# Patient Record
Sex: Female | Born: 1950
Health system: Southern US, Community
[De-identification: ages and names within clinical notes are randomized; demographics above are authoritative.]

## PROBLEM LIST (undated history)

## (undated) DIAGNOSIS — E039 Hypothyroidism, unspecified: Secondary | ICD-10-CM

## (undated) DIAGNOSIS — E079 Disorder of thyroid, unspecified: Secondary | ICD-10-CM

## (undated) DIAGNOSIS — Z860100 Personal history of colon polyps, unspecified: Secondary | ICD-10-CM

## (undated) DIAGNOSIS — M069 Rheumatoid arthritis, unspecified: Secondary | ICD-10-CM

## (undated) DIAGNOSIS — R0600 Dyspnea, unspecified: Secondary | ICD-10-CM

## (undated) DIAGNOSIS — K219 Gastro-esophageal reflux disease without esophagitis: Secondary | ICD-10-CM

## (undated) DIAGNOSIS — I10 Essential (primary) hypertension: Secondary | ICD-10-CM

## (undated) DIAGNOSIS — F419 Anxiety disorder, unspecified: Secondary | ICD-10-CM

## (undated) DIAGNOSIS — Z86711 Personal history of pulmonary embolism: Secondary | ICD-10-CM

## (undated) DIAGNOSIS — L930 Discoid lupus erythematosus: Secondary | ICD-10-CM

## (undated) DIAGNOSIS — F32A Depression, unspecified: Secondary | ICD-10-CM

## (undated) DIAGNOSIS — J449 Chronic obstructive pulmonary disease, unspecified: Secondary | ICD-10-CM

## (undated) DIAGNOSIS — E119 Type 2 diabetes mellitus without complications: Secondary | ICD-10-CM

## (undated) HISTORY — DX: Rheumatoid arthritis, unspecified: M06.9

## (undated) HISTORY — PX: ABDOMINAL HYSTERECTOMY: SHX81

## (undated) HISTORY — DX: Personal history of colonic polyps: Z86.010

## (undated) HISTORY — DX: Gastro-esophageal reflux disease without esophagitis: K21.9

## (undated) HISTORY — DX: Discoid lupus erythematosus: L93.0

## (undated) HISTORY — PX: OTHER SURGICAL HISTORY: SHX169

## (undated) HISTORY — DX: Disorder of thyroid, unspecified: E07.9

## (undated) HISTORY — DX: Chronic obstructive pulmonary disease, unspecified: J44.9

## (undated) HISTORY — DX: Personal history of colon polyps, unspecified: Z86.0100

## (undated) HISTORY — DX: Hypothyroidism, unspecified: E03.9

## (undated) HISTORY — PX: TONSILLECTOMY: SUR1361

## (undated) HISTORY — DX: Personal history of pulmonary embolism: Z86.711

## (undated) NOTE — *Deleted (*Deleted)
Nsg Discharge Note  Admit Date:  08/06/2020 Discharge date: 08/17/2020   Jaime Kelley to be D/C'd Skilled nursing facility per MD order.    Discharge Medication: Allergies as of 08/17/2020      Reactions   Effexor [venlafaxine Hydrochloride] Rash      Medication List    STOP taking these medications   HYDROcodone-acetaminophen 10-325 MG tablet Commonly known as: NORCO   warfarin 1 MG tablet Commonly known as: COUMADIN   warfarin 7.5 MG tablet Commonly known as: COUMADIN     TAKE these medications   acetaminophen 325 MG tablet Commonly known as: TYLENOL Take 650 mg by mouth every 4 (four) hours as needed for mild pain, fever or headache.   albuterol 108 (90 Base) MCG/ACT inhaler Commonly known as: ProAir HFA INHALE 2 PUFFS INTO THE LUNGS EVERY 4 (FOUR) HOURS AS NEEDED FOR WHEEZING. What changed:   how much to take  how to take this  when to take this  additional instructions   ALPRAZolam 1 MG tablet Commonly known as: XANAX Take 1 tablet (1 mg total) by mouth 3 (three) times daily as needed for anxiety. What changed: when to take this   amLODipine 10 MG tablet Commonly known as: NORVASC TAKE 1 TABLET BY MOUTH EVERY DAY   apixaban 5 MG Tabs tablet Commonly known as: ELIQUIS Take 1 tablet (5 mg total) by mouth 2 (two) times daily.   arformoterol 15 MCG/2ML Nebu Commonly known as: BROVANA Take 2 mLs (15 mcg total) by nebulization 2 (two) times daily.   ascorbic acid 500 MG tablet Commonly known as: VITAMIN C Take 500 mg by mouth 2 (two) times daily.   budesonide 0.25 MG/2ML nebulizer solution Commonly known as: PULMICORT Take 2 mLs (0.25 mg total) by nebulization 2 (two) times daily.   feeding supplement (GLUCERNA SHAKE) Liqd Take 237 mLs by mouth 3 (three) times daily between meals.   guaiFENesin 600 MG 12 hr tablet Commonly known as: MUCINEX Take 2 tablets (1,200 mg total) by mouth 2 (two) times daily.   insulin aspart 100 UNIT/ML  injection Commonly known as: novoLOG Inject 0-15 Units into the skin 3 (three) times daily with meals.   ipratropium-albuterol 0.5-2.5 (3) MG/3ML Soln Commonly known as: DUONEB TAKE 3 MLS BY NEBULIZATION EVERY 4 (FOUR) HOURS AS NEEDED. What changed: reasons to take this   losartan 100 MG tablet Commonly known as: COZAAR TAKE 1 TABLET BY MOUTH EVERY DAY   metFORMIN 500 MG tablet Commonly known as: GLUCOPHAGE Take 1 tablet (500 mg total) by mouth 2 (two) times daily with a meal.   multivitamin with minerals Tabs tablet Take 1 tablet by mouth daily.   Muscle Rub 10-15 % Crea Apply 1 application topically as needed for muscle pain.   OXYGEN Inhale 5 L into the lungs. continuous   pantoprazole 40 MG tablet Commonly known as: PROTONIX Take 1 tablet (40 mg total) by mouth daily. Start taking on: August 18, 2020   revefenacin 175 MCG/3ML nebulizer solution Commonly known as: YUPELRI Take 3 mLs (175 mcg total) by nebulization daily.   sertraline 100 MG tablet Commonly known as: ZOLOFT TAKE 1 TABLET BY MOUTH EVERY DAY   Spiriva Respimat 2.5 MCG/ACT Aers Generic drug: Tiotropium Bromide Monohydrate Inhale 2 puffs into the lungs daily.   Synthroid 137 MCG tablet Generic drug: levothyroxine TAKE 1 TABLET BY MOUTH EVERY DAY BEFORE BREAKFAST What changed: See the new instructions.   Vitamin D (Ergocalciferol) 1.25 MG (50000 UNIT) Caps  capsule Commonly known as: DRISDOL Take 50,000 Units by mouth daily.       Discharge Assessment: Vitals:   08/17/20 0734 08/17/20 1154  BP: 123/71 (!) 115/55  Pulse: 73 77  Resp: 20 20  Temp: 98.1 F (36.7 C) 98 F (36.7 C)  SpO2: 97% 94%   Skin clean, dry and intact without evidence of skin break down, no evidence of skin tears noted. IV catheter discontinued intact. Site without signs and symptoms of complications - no redness or edema noted at insertion site, patient denies c/o pain - only slight tenderness at site.  Dressing with  slight pressure applied.  D/c Instructions-Education: Discharge instructions given to patient/family with verbalized understanding. D/c education completed with patient/family including follow up instructions, medication list, d/c activities limitations if indicated, with other d/c instructions as indicated by MD - patient able to verbalize understanding, all questions fully answered. Patient instructed to return to ED, call 911, or call MD for any changes in condition.  Patient escorted via stretcher and [facility] ambulance services.  Lyndal Pulley, RN 08/17/2020 3:16 PM

---

## 1998-02-08 ENCOUNTER — Encounter: Admission: RE | Admit: 1998-02-08 | Discharge: 1998-02-08 | Payer: Self-pay | Admitting: Internal Medicine

## 1998-02-10 ENCOUNTER — Emergency Department (HOSPITAL_COMMUNITY): Admission: EM | Admit: 1998-02-10 | Discharge: 1998-02-10 | Payer: Self-pay | Admitting: Emergency Medicine

## 1998-02-16 ENCOUNTER — Ambulatory Visit: Admission: RE | Admit: 1998-02-16 | Discharge: 1998-02-16 | Payer: Self-pay | Admitting: *Deleted

## 1998-03-03 ENCOUNTER — Encounter: Admission: RE | Admit: 1998-03-03 | Discharge: 1998-03-03 | Payer: Self-pay | Admitting: Hematology and Oncology

## 1998-03-15 ENCOUNTER — Encounter: Admission: RE | Admit: 1998-03-15 | Discharge: 1998-03-15 | Payer: Self-pay | Admitting: Internal Medicine

## 1998-03-18 ENCOUNTER — Ambulatory Visit: Admission: RE | Admit: 1998-03-18 | Discharge: 1998-03-18 | Payer: Self-pay | Admitting: *Deleted

## 1998-04-01 ENCOUNTER — Encounter: Admission: RE | Admit: 1998-04-01 | Discharge: 1998-04-01 | Payer: Self-pay | Admitting: Hematology and Oncology

## 1998-04-13 ENCOUNTER — Ambulatory Visit (HOSPITAL_COMMUNITY): Admission: RE | Admit: 1998-04-13 | Discharge: 1998-04-13 | Payer: Self-pay | Admitting: *Deleted

## 1998-04-17 ENCOUNTER — Ambulatory Visit (HOSPITAL_COMMUNITY): Admission: RE | Admit: 1998-04-17 | Discharge: 1998-04-17 | Payer: Self-pay | Admitting: *Deleted

## 1998-05-21 ENCOUNTER — Other Ambulatory Visit: Admission: RE | Admit: 1998-05-21 | Discharge: 1998-05-21 | Payer: Self-pay | Admitting: *Deleted

## 1998-05-24 ENCOUNTER — Encounter: Admission: RE | Admit: 1998-05-24 | Discharge: 1998-05-24 | Payer: Self-pay | Admitting: Internal Medicine

## 1998-07-21 ENCOUNTER — Encounter: Admission: RE | Admit: 1998-07-21 | Discharge: 1998-07-21 | Payer: Self-pay | Admitting: Internal Medicine

## 1998-09-03 ENCOUNTER — Encounter: Admission: RE | Admit: 1998-09-03 | Discharge: 1998-09-03 | Payer: Self-pay | Admitting: Internal Medicine

## 1998-09-14 ENCOUNTER — Encounter: Admission: RE | Admit: 1998-09-14 | Discharge: 1998-09-14 | Payer: Self-pay | Admitting: Internal Medicine

## 1998-09-28 ENCOUNTER — Encounter: Admission: RE | Admit: 1998-09-28 | Discharge: 1998-09-28 | Payer: Self-pay | Admitting: Internal Medicine

## 1998-10-23 ENCOUNTER — Emergency Department (HOSPITAL_COMMUNITY): Admission: EM | Admit: 1998-10-23 | Discharge: 1998-10-23 | Payer: Self-pay | Admitting: Emergency Medicine

## 1998-10-23 ENCOUNTER — Encounter: Payer: Self-pay | Admitting: Emergency Medicine

## 1998-10-29 ENCOUNTER — Encounter: Admission: RE | Admit: 1998-10-29 | Discharge: 1998-10-29 | Payer: Self-pay | Admitting: Internal Medicine

## 1998-11-12 ENCOUNTER — Encounter: Admission: RE | Admit: 1998-11-12 | Discharge: 1998-11-12 | Payer: Self-pay | Admitting: Internal Medicine

## 1998-11-24 ENCOUNTER — Encounter: Admission: RE | Admit: 1998-11-24 | Discharge: 1998-11-24 | Payer: Self-pay | Admitting: Internal Medicine

## 1998-12-01 ENCOUNTER — Encounter: Admission: RE | Admit: 1998-12-01 | Discharge: 1998-12-01 | Payer: Self-pay | Admitting: Internal Medicine

## 1998-12-17 ENCOUNTER — Encounter: Admission: RE | Admit: 1998-12-17 | Discharge: 1998-12-17 | Payer: Self-pay | Admitting: Internal Medicine

## 1999-01-27 ENCOUNTER — Encounter: Admission: RE | Admit: 1999-01-27 | Discharge: 1999-01-27 | Payer: Self-pay | Admitting: Internal Medicine

## 1999-02-01 ENCOUNTER — Encounter: Admission: RE | Admit: 1999-02-01 | Discharge: 1999-02-01 | Payer: Self-pay | Admitting: Hematology and Oncology

## 1999-02-04 ENCOUNTER — Encounter: Admission: RE | Admit: 1999-02-04 | Discharge: 1999-02-04 | Payer: Self-pay | Admitting: Internal Medicine

## 1999-03-29 ENCOUNTER — Encounter: Admission: RE | Admit: 1999-03-29 | Discharge: 1999-03-29 | Payer: Self-pay | Admitting: Internal Medicine

## 1999-04-05 ENCOUNTER — Encounter: Admission: RE | Admit: 1999-04-05 | Discharge: 1999-04-05 | Payer: Self-pay | Admitting: Internal Medicine

## 1999-04-13 ENCOUNTER — Encounter: Admission: RE | Admit: 1999-04-13 | Discharge: 1999-04-13 | Payer: Self-pay | Admitting: Hematology and Oncology

## 1999-04-27 ENCOUNTER — Encounter: Admission: RE | Admit: 1999-04-27 | Discharge: 1999-04-27 | Payer: Self-pay | Admitting: Hematology and Oncology

## 1999-05-03 ENCOUNTER — Other Ambulatory Visit: Admission: RE | Admit: 1999-05-03 | Discharge: 1999-05-03 | Payer: Self-pay | Admitting: *Deleted

## 1999-05-11 ENCOUNTER — Encounter: Admission: RE | Admit: 1999-05-11 | Discharge: 1999-05-11 | Payer: Self-pay | Admitting: Hematology and Oncology

## 1999-05-18 ENCOUNTER — Encounter: Admission: RE | Admit: 1999-05-18 | Discharge: 1999-05-18 | Payer: Self-pay

## 1999-06-09 ENCOUNTER — Encounter: Admission: RE | Admit: 1999-06-09 | Discharge: 1999-06-09 | Payer: Self-pay | Admitting: Hematology and Oncology

## 1999-06-14 ENCOUNTER — Ambulatory Visit (HOSPITAL_COMMUNITY): Admission: RE | Admit: 1999-06-14 | Discharge: 1999-06-14 | Payer: Self-pay

## 1999-07-29 ENCOUNTER — Emergency Department (HOSPITAL_COMMUNITY): Admission: EM | Admit: 1999-07-29 | Discharge: 1999-07-29 | Payer: Self-pay | Admitting: Emergency Medicine

## 1999-07-29 ENCOUNTER — Encounter: Payer: Self-pay | Admitting: Emergency Medicine

## 1999-08-03 ENCOUNTER — Emergency Department (HOSPITAL_COMMUNITY): Admission: EM | Admit: 1999-08-03 | Discharge: 1999-08-03 | Payer: Self-pay | Admitting: Emergency Medicine

## 1999-08-03 ENCOUNTER — Encounter: Payer: Self-pay | Admitting: Emergency Medicine

## 1999-08-25 ENCOUNTER — Encounter: Admission: RE | Admit: 1999-08-25 | Discharge: 1999-08-25 | Payer: Self-pay | Admitting: Hematology and Oncology

## 1999-09-29 ENCOUNTER — Encounter: Admission: RE | Admit: 1999-09-29 | Discharge: 1999-09-29 | Payer: Self-pay | Admitting: Internal Medicine

## 1999-11-25 ENCOUNTER — Encounter: Admission: RE | Admit: 1999-11-25 | Discharge: 1999-11-25 | Payer: Self-pay | Admitting: Internal Medicine

## 2000-01-30 ENCOUNTER — Encounter: Admission: RE | Admit: 2000-01-30 | Discharge: 2000-01-30 | Payer: Self-pay | Admitting: Internal Medicine

## 2000-04-05 ENCOUNTER — Encounter: Admission: RE | Admit: 2000-04-05 | Discharge: 2000-04-05 | Payer: Self-pay | Admitting: Hematology and Oncology

## 2000-04-10 ENCOUNTER — Encounter: Admission: RE | Admit: 2000-04-10 | Discharge: 2000-04-10 | Payer: Self-pay | Admitting: Internal Medicine

## 2000-04-24 ENCOUNTER — Encounter: Admission: RE | Admit: 2000-04-24 | Discharge: 2000-04-24 | Payer: Self-pay | Admitting: Internal Medicine

## 2000-06-08 ENCOUNTER — Encounter: Admission: RE | Admit: 2000-06-08 | Discharge: 2000-06-08 | Payer: Self-pay | Admitting: Internal Medicine

## 2000-07-06 ENCOUNTER — Emergency Department (HOSPITAL_COMMUNITY): Admission: EM | Admit: 2000-07-06 | Discharge: 2000-07-06 | Payer: Self-pay | Admitting: *Deleted

## 2001-03-22 ENCOUNTER — Encounter: Admission: RE | Admit: 2001-03-22 | Discharge: 2001-03-22 | Payer: Self-pay | Admitting: Obstetrics and Gynecology

## 2001-03-22 ENCOUNTER — Encounter: Payer: Self-pay | Admitting: Family Medicine

## 2001-08-13 ENCOUNTER — Other Ambulatory Visit: Admission: RE | Admit: 2001-08-13 | Discharge: 2001-08-13 | Payer: Self-pay | Admitting: *Deleted

## 2001-09-05 ENCOUNTER — Encounter: Admission: RE | Admit: 2001-09-05 | Discharge: 2001-09-05 | Payer: Self-pay | Admitting: Family Medicine

## 2001-09-05 ENCOUNTER — Encounter: Payer: Self-pay | Admitting: Family Medicine

## 2002-09-09 ENCOUNTER — Other Ambulatory Visit: Admission: RE | Admit: 2002-09-09 | Discharge: 2002-09-09 | Payer: Self-pay | Admitting: Obstetrics and Gynecology

## 2002-12-25 ENCOUNTER — Encounter: Payer: Self-pay | Admitting: Family Medicine

## 2002-12-25 ENCOUNTER — Encounter: Admission: RE | Admit: 2002-12-25 | Discharge: 2002-12-25 | Payer: Self-pay | Admitting: Family Medicine

## 2003-08-03 ENCOUNTER — Encounter: Admission: RE | Admit: 2003-08-03 | Discharge: 2003-08-03 | Payer: Self-pay | Admitting: Family Medicine

## 2003-08-03 ENCOUNTER — Encounter: Payer: Self-pay | Admitting: Family Medicine

## 2004-01-22 ENCOUNTER — Encounter: Admission: RE | Admit: 2004-01-22 | Discharge: 2004-01-22 | Payer: Self-pay | Admitting: *Deleted

## 2004-08-31 ENCOUNTER — Ambulatory Visit: Payer: Self-pay | Admitting: Internal Medicine

## 2004-09-07 ENCOUNTER — Ambulatory Visit: Payer: Self-pay | Admitting: Family Medicine

## 2004-09-19 ENCOUNTER — Ambulatory Visit: Payer: Self-pay | Admitting: Family Medicine

## 2004-10-31 ENCOUNTER — Ambulatory Visit: Payer: Self-pay | Admitting: Family Medicine

## 2004-11-23 ENCOUNTER — Ambulatory Visit: Payer: Self-pay | Admitting: Family Medicine

## 2004-11-28 ENCOUNTER — Ambulatory Visit: Payer: Self-pay | Admitting: Family Medicine

## 2004-12-26 ENCOUNTER — Ambulatory Visit: Payer: Self-pay | Admitting: Internal Medicine

## 2005-01-27 ENCOUNTER — Ambulatory Visit: Payer: Self-pay | Admitting: Family Medicine

## 2005-02-03 ENCOUNTER — Encounter: Admission: RE | Admit: 2005-02-03 | Discharge: 2005-02-03 | Payer: Self-pay | Admitting: *Deleted

## 2005-02-16 ENCOUNTER — Ambulatory Visit: Payer: Self-pay | Admitting: Family Medicine

## 2005-02-27 ENCOUNTER — Ambulatory Visit: Payer: Self-pay | Admitting: Family Medicine

## 2005-03-30 ENCOUNTER — Ambulatory Visit: Payer: Self-pay | Admitting: Family Medicine

## 2005-04-12 ENCOUNTER — Ambulatory Visit: Payer: Self-pay | Admitting: Family Medicine

## 2005-05-08 ENCOUNTER — Ambulatory Visit: Payer: Self-pay | Admitting: Family Medicine

## 2005-06-09 ENCOUNTER — Ambulatory Visit: Payer: Self-pay | Admitting: Family Medicine

## 2005-07-07 ENCOUNTER — Ambulatory Visit: Payer: Self-pay | Admitting: Family Medicine

## 2005-07-08 ENCOUNTER — Emergency Department (HOSPITAL_COMMUNITY): Admission: EM | Admit: 2005-07-08 | Discharge: 2005-07-08 | Payer: Self-pay | Admitting: Emergency Medicine

## 2005-08-04 ENCOUNTER — Ambulatory Visit: Payer: Self-pay | Admitting: Family Medicine

## 2005-08-23 ENCOUNTER — Ambulatory Visit: Payer: Self-pay | Admitting: Family Medicine

## 2005-08-24 ENCOUNTER — Emergency Department (HOSPITAL_COMMUNITY): Admission: EM | Admit: 2005-08-24 | Discharge: 2005-08-24 | Payer: Self-pay | Admitting: Emergency Medicine

## 2005-09-01 ENCOUNTER — Ambulatory Visit: Payer: Self-pay | Admitting: Family Medicine

## 2005-09-11 ENCOUNTER — Ambulatory Visit: Payer: Self-pay | Admitting: Family Medicine

## 2005-10-13 ENCOUNTER — Ambulatory Visit: Payer: Self-pay | Admitting: Family Medicine

## 2005-11-14 ENCOUNTER — Ambulatory Visit: Payer: Self-pay | Admitting: Family Medicine

## 2005-12-18 ENCOUNTER — Ambulatory Visit: Payer: Self-pay | Admitting: Family Medicine

## 2006-01-15 ENCOUNTER — Ambulatory Visit: Payer: Self-pay | Admitting: Family Medicine

## 2006-02-12 ENCOUNTER — Encounter: Admission: RE | Admit: 2006-02-12 | Discharge: 2006-02-12 | Payer: Self-pay | Admitting: Family Medicine

## 2006-02-12 ENCOUNTER — Ambulatory Visit: Payer: Self-pay | Admitting: Family Medicine

## 2006-02-20 ENCOUNTER — Ambulatory Visit: Payer: Self-pay | Admitting: Family Medicine

## 2006-02-27 ENCOUNTER — Ambulatory Visit: Payer: Self-pay | Admitting: Family Medicine

## 2006-03-12 ENCOUNTER — Ambulatory Visit: Payer: Self-pay | Admitting: Family Medicine

## 2006-03-16 ENCOUNTER — Ambulatory Visit: Payer: Self-pay | Admitting: Family Medicine

## 2006-03-24 ENCOUNTER — Emergency Department (HOSPITAL_COMMUNITY): Admission: EM | Admit: 2006-03-24 | Discharge: 2006-03-24 | Payer: Self-pay | Admitting: Emergency Medicine

## 2006-04-03 ENCOUNTER — Ambulatory Visit: Payer: Self-pay | Admitting: Family Medicine

## 2006-04-03 ENCOUNTER — Encounter: Admission: RE | Admit: 2006-04-03 | Discharge: 2006-04-03 | Payer: Self-pay | Admitting: Obstetrics and Gynecology

## 2006-05-04 ENCOUNTER — Ambulatory Visit: Payer: Self-pay | Admitting: Family Medicine

## 2006-06-01 ENCOUNTER — Ambulatory Visit: Payer: Self-pay | Admitting: Family Medicine

## 2006-07-04 ENCOUNTER — Ambulatory Visit: Payer: Self-pay | Admitting: Family Medicine

## 2006-08-06 ENCOUNTER — Ambulatory Visit: Payer: Self-pay | Admitting: Family Medicine

## 2006-09-07 ENCOUNTER — Ambulatory Visit: Payer: Self-pay | Admitting: Family Medicine

## 2006-10-08 ENCOUNTER — Ambulatory Visit: Payer: Self-pay | Admitting: Family Medicine

## 2006-11-09 ENCOUNTER — Ambulatory Visit: Payer: Self-pay | Admitting: Family Medicine

## 2006-11-14 ENCOUNTER — Ambulatory Visit: Payer: Self-pay | Admitting: Family Medicine

## 2006-11-21 ENCOUNTER — Ambulatory Visit: Payer: Self-pay | Admitting: Family Medicine

## 2006-11-22 LAB — CONVERTED CEMR LAB
Amylase: 37 units/L (ref 27–131)
Basophils Absolute: 0 10*3/uL (ref 0.0–0.1)
Basophils Relative: 0.5 % (ref 0.0–1.0)
CO2: 29 meq/L (ref 19–32)
Chloride: 100 meq/L (ref 96–112)
Creatinine, Ser: 0.9 mg/dL (ref 0.4–1.2)
HCT: 43.5 % (ref 36.0–46.0)
Hemoglobin: 15.3 g/dL — ABNORMAL HIGH (ref 12.0–15.0)
MCHC: 35.2 g/dL (ref 30.0–36.0)
Neutrophils Relative %: 44.5 % (ref 43.0–77.0)
RBC: 4.64 M/uL (ref 3.87–5.11)
RDW: 14.5 % (ref 11.5–14.6)
Sodium: 138 meq/L (ref 135–145)
WBC: 8.6 10*3/uL (ref 4.5–10.5)

## 2006-11-25 ENCOUNTER — Emergency Department (HOSPITAL_COMMUNITY): Admission: EM | Admit: 2006-11-25 | Discharge: 2006-11-25 | Payer: Self-pay | Admitting: *Deleted

## 2006-11-28 ENCOUNTER — Ambulatory Visit: Payer: Self-pay | Admitting: Family Medicine

## 2006-12-05 ENCOUNTER — Ambulatory Visit: Payer: Self-pay | Admitting: Family Medicine

## 2006-12-12 ENCOUNTER — Ambulatory Visit: Payer: Self-pay | Admitting: Family Medicine

## 2006-12-31 ENCOUNTER — Encounter: Payer: Self-pay | Admitting: Family Medicine

## 2007-01-11 ENCOUNTER — Encounter: Admission: RE | Admit: 2007-01-11 | Discharge: 2007-01-11 | Payer: Self-pay | Admitting: Internal Medicine

## 2007-01-14 ENCOUNTER — Ambulatory Visit: Payer: Self-pay | Admitting: Family Medicine

## 2007-02-15 ENCOUNTER — Ambulatory Visit: Payer: Self-pay | Admitting: Family Medicine

## 2007-02-19 ENCOUNTER — Encounter: Admission: RE | Admit: 2007-02-19 | Discharge: 2007-02-19 | Payer: Self-pay | Admitting: Family Medicine

## 2007-03-01 ENCOUNTER — Ambulatory Visit: Payer: Self-pay | Admitting: Family Medicine

## 2007-03-29 ENCOUNTER — Ambulatory Visit: Payer: Self-pay | Admitting: Family Medicine

## 2007-04-15 DIAGNOSIS — K219 Gastro-esophageal reflux disease without esophagitis: Secondary | ICD-10-CM

## 2007-04-15 DIAGNOSIS — J438 Other emphysema: Secondary | ICD-10-CM | POA: Insufficient documentation

## 2007-04-15 DIAGNOSIS — Z860100 Personal history of colon polyps, unspecified: Secondary | ICD-10-CM | POA: Insufficient documentation

## 2007-04-15 DIAGNOSIS — Z86718 Personal history of other venous thrombosis and embolism: Secondary | ICD-10-CM | POA: Insufficient documentation

## 2007-04-15 DIAGNOSIS — Z8719 Personal history of other diseases of the digestive system: Secondary | ICD-10-CM

## 2007-04-15 DIAGNOSIS — L930 Discoid lupus erythematosus: Secondary | ICD-10-CM

## 2007-04-15 DIAGNOSIS — E039 Hypothyroidism, unspecified: Secondary | ICD-10-CM | POA: Insufficient documentation

## 2007-04-29 ENCOUNTER — Ambulatory Visit: Payer: Self-pay | Admitting: Family Medicine

## 2007-05-31 ENCOUNTER — Ambulatory Visit: Payer: Self-pay | Admitting: Family Medicine

## 2007-05-31 DIAGNOSIS — I824Y9 Acute embolism and thrombosis of unspecified deep veins of unspecified proximal lower extremity: Secondary | ICD-10-CM

## 2007-05-31 LAB — CONVERTED CEMR LAB: Prothrombin Time: 18.8 s

## 2007-06-17 ENCOUNTER — Ambulatory Visit: Payer: Self-pay | Admitting: Family Medicine

## 2007-06-17 DIAGNOSIS — J209 Acute bronchitis, unspecified: Secondary | ICD-10-CM

## 2007-07-01 ENCOUNTER — Ambulatory Visit: Payer: Self-pay | Admitting: Family Medicine

## 2007-07-29 ENCOUNTER — Ambulatory Visit: Payer: Self-pay | Admitting: Family Medicine

## 2007-07-29 LAB — CONVERTED CEMR LAB
INR: 4.3
Prothrombin Time: 25 s

## 2007-08-12 ENCOUNTER — Ambulatory Visit: Payer: Self-pay | Admitting: Family Medicine

## 2007-09-13 ENCOUNTER — Ambulatory Visit: Payer: Self-pay | Admitting: Family Medicine

## 2007-10-11 ENCOUNTER — Ambulatory Visit: Payer: Self-pay | Admitting: Family Medicine

## 2007-10-11 LAB — CONVERTED CEMR LAB: Prothrombin Time: 19.9 s

## 2007-10-31 ENCOUNTER — Ambulatory Visit: Payer: Self-pay | Admitting: Family Medicine

## 2007-10-31 DIAGNOSIS — J449 Chronic obstructive pulmonary disease, unspecified: Secondary | ICD-10-CM | POA: Insufficient documentation

## 2007-11-04 ENCOUNTER — Ambulatory Visit: Payer: Self-pay | Admitting: Family Medicine

## 2007-11-11 ENCOUNTER — Ambulatory Visit: Payer: Self-pay | Admitting: Family Medicine

## 2007-11-11 LAB — CONVERTED CEMR LAB: INR: 2.6

## 2007-12-10 ENCOUNTER — Encounter: Payer: Self-pay | Admitting: Family Medicine

## 2007-12-13 ENCOUNTER — Ambulatory Visit: Payer: Self-pay | Admitting: Family Medicine

## 2007-12-13 LAB — CONVERTED CEMR LAB
INR: 3.3
Prothrombin Time: 21.9 s

## 2007-12-20 ENCOUNTER — Telehealth: Payer: Self-pay | Admitting: Family Medicine

## 2008-01-10 ENCOUNTER — Ambulatory Visit: Payer: Self-pay | Admitting: Family Medicine

## 2008-01-10 LAB — CONVERTED CEMR LAB: INR: 4

## 2008-01-23 ENCOUNTER — Telehealth: Payer: Self-pay | Admitting: Family Medicine

## 2008-01-24 ENCOUNTER — Ambulatory Visit: Payer: Self-pay | Admitting: Family Medicine

## 2008-02-04 ENCOUNTER — Encounter: Payer: Self-pay | Admitting: Family Medicine

## 2008-02-07 ENCOUNTER — Ambulatory Visit: Payer: Self-pay | Admitting: Family Medicine

## 2008-02-21 ENCOUNTER — Encounter: Admission: RE | Admit: 2008-02-21 | Discharge: 2008-02-21 | Payer: Self-pay | Admitting: Family Medicine

## 2008-02-21 ENCOUNTER — Ambulatory Visit: Payer: Self-pay | Admitting: Family Medicine

## 2008-02-21 LAB — CONVERTED CEMR LAB
INR: 2.5
Prothrombin Time: 19.2 s

## 2008-03-20 ENCOUNTER — Ambulatory Visit: Payer: Self-pay | Admitting: Family Medicine

## 2008-03-20 LAB — CONVERTED CEMR LAB
INR: 2.8
Prothrombin Time: 20.2 s

## 2008-04-20 ENCOUNTER — Ambulatory Visit: Payer: Self-pay | Admitting: Family Medicine

## 2008-05-22 ENCOUNTER — Ambulatory Visit: Payer: Self-pay | Admitting: Family Medicine

## 2008-05-25 ENCOUNTER — Ambulatory Visit: Payer: Self-pay | Admitting: Family Medicine

## 2008-06-04 ENCOUNTER — Encounter: Payer: Self-pay | Admitting: Family Medicine

## 2008-06-19 ENCOUNTER — Ambulatory Visit: Payer: Self-pay | Admitting: Family Medicine

## 2008-06-26 ENCOUNTER — Ambulatory Visit: Payer: Self-pay | Admitting: Family Medicine

## 2008-06-26 DIAGNOSIS — S6390XA Sprain of unspecified part of unspecified wrist and hand, initial encounter: Secondary | ICD-10-CM | POA: Insufficient documentation

## 2008-06-30 ENCOUNTER — Telehealth: Payer: Self-pay | Admitting: Family Medicine

## 2008-07-20 ENCOUNTER — Ambulatory Visit: Payer: Self-pay | Admitting: Family Medicine

## 2008-07-20 LAB — CONVERTED CEMR LAB
INR: 2.8
Prothrombin Time: 20.3 s

## 2008-07-23 ENCOUNTER — Ambulatory Visit: Payer: Self-pay | Admitting: Internal Medicine

## 2008-08-14 ENCOUNTER — Telehealth: Payer: Self-pay | Admitting: Family Medicine

## 2008-10-05 ENCOUNTER — Encounter: Payer: Self-pay | Admitting: Family Medicine

## 2008-11-06 ENCOUNTER — Ambulatory Visit: Payer: Self-pay | Admitting: Family Medicine

## 2008-11-06 LAB — CONVERTED CEMR LAB
INR: 2.4
Prothrombin Time: 18.8 s

## 2008-12-11 ENCOUNTER — Ambulatory Visit: Payer: Self-pay | Admitting: Family Medicine

## 2008-12-11 LAB — CONVERTED CEMR LAB: Prothrombin Time: 19 s

## 2009-01-08 ENCOUNTER — Ambulatory Visit: Payer: Self-pay | Admitting: Family Medicine

## 2009-02-01 ENCOUNTER — Encounter: Payer: Self-pay | Admitting: Family Medicine

## 2009-02-08 ENCOUNTER — Ambulatory Visit: Payer: Self-pay | Admitting: Family Medicine

## 2009-02-08 LAB — CONVERTED CEMR LAB: Prothrombin Time: 23 s

## 2009-02-10 ENCOUNTER — Telehealth: Payer: Self-pay | Admitting: Family Medicine

## 2009-02-10 ENCOUNTER — Ambulatory Visit: Payer: Self-pay | Admitting: Family Medicine

## 2009-02-10 DIAGNOSIS — M069 Rheumatoid arthritis, unspecified: Secondary | ICD-10-CM | POA: Insufficient documentation

## 2009-02-26 ENCOUNTER — Encounter: Payer: Self-pay | Admitting: Family Medicine

## 2009-02-26 ENCOUNTER — Encounter: Admission: RE | Admit: 2009-02-26 | Discharge: 2009-02-26 | Payer: Self-pay | Admitting: Family Medicine

## 2009-03-01 ENCOUNTER — Encounter: Admission: RE | Admit: 2009-03-01 | Discharge: 2009-03-01 | Payer: Self-pay | Admitting: Family Medicine

## 2009-03-08 ENCOUNTER — Telehealth: Payer: Self-pay | Admitting: Family Medicine

## 2009-03-10 ENCOUNTER — Ambulatory Visit: Payer: Self-pay | Admitting: Family Medicine

## 2009-03-10 LAB — CONVERTED CEMR LAB
INR: 2.5
Prothrombin Time: 19.4 s

## 2009-03-26 ENCOUNTER — Ambulatory Visit: Payer: Self-pay | Admitting: Family Medicine

## 2009-04-09 ENCOUNTER — Telehealth: Payer: Self-pay | Admitting: Family Medicine

## 2009-04-12 ENCOUNTER — Ambulatory Visit: Payer: Self-pay | Admitting: Family Medicine

## 2009-04-12 ENCOUNTER — Telehealth: Payer: Self-pay | Admitting: Family Medicine

## 2009-05-05 ENCOUNTER — Encounter: Payer: Self-pay | Admitting: Family Medicine

## 2009-05-12 ENCOUNTER — Ambulatory Visit: Payer: Self-pay | Admitting: Family Medicine

## 2009-05-12 LAB — CONVERTED CEMR LAB
INR: 2.7
Prothrombin Time: 19.9 s

## 2009-06-16 ENCOUNTER — Ambulatory Visit: Payer: Self-pay | Admitting: Family Medicine

## 2009-06-16 LAB — CONVERTED CEMR LAB
INR: 3
Prothrombin Time: 20.9 s

## 2009-07-09 ENCOUNTER — Ambulatory Visit: Payer: Self-pay | Admitting: Family Medicine

## 2009-07-09 DIAGNOSIS — S335XXA Sprain of ligaments of lumbar spine, initial encounter: Secondary | ICD-10-CM

## 2009-07-12 ENCOUNTER — Telehealth: Payer: Self-pay | Admitting: Family Medicine

## 2009-07-16 ENCOUNTER — Telehealth: Payer: Self-pay | Admitting: Family Medicine

## 2009-07-16 ENCOUNTER — Ambulatory Visit: Payer: Self-pay | Admitting: Family Medicine

## 2009-07-19 ENCOUNTER — Telehealth: Payer: Self-pay | Admitting: Family Medicine

## 2009-07-26 ENCOUNTER — Ambulatory Visit: Payer: Self-pay | Admitting: Family Medicine

## 2009-07-28 ENCOUNTER — Encounter: Admission: RE | Admit: 2009-07-28 | Discharge: 2009-09-22 | Payer: Self-pay | Admitting: Family Medicine

## 2009-08-02 ENCOUNTER — Telehealth: Payer: Self-pay | Admitting: Family Medicine

## 2009-08-03 ENCOUNTER — Ambulatory Visit: Payer: Self-pay | Admitting: Family Medicine

## 2009-08-04 ENCOUNTER — Encounter: Payer: Self-pay | Admitting: Family Medicine

## 2009-08-04 ENCOUNTER — Telehealth: Payer: Self-pay | Admitting: Family Medicine

## 2009-08-05 ENCOUNTER — Telehealth: Payer: Self-pay | Admitting: Family Medicine

## 2009-08-06 ENCOUNTER — Ambulatory Visit: Payer: Self-pay | Admitting: Family Medicine

## 2009-08-06 LAB — CONVERTED CEMR LAB: INR: 2.5

## 2009-08-26 ENCOUNTER — Encounter: Payer: Self-pay | Admitting: Family Medicine

## 2009-08-27 ENCOUNTER — Telehealth: Payer: Self-pay | Admitting: Family Medicine

## 2009-08-31 ENCOUNTER — Telehealth: Payer: Self-pay | Admitting: Family Medicine

## 2009-08-31 ENCOUNTER — Encounter (INDEPENDENT_AMBULATORY_CARE_PROVIDER_SITE_OTHER): Payer: Self-pay | Admitting: *Deleted

## 2009-09-01 ENCOUNTER — Encounter: Admission: RE | Admit: 2009-09-01 | Discharge: 2009-09-01 | Payer: Self-pay | Admitting: Family Medicine

## 2009-09-06 ENCOUNTER — Ambulatory Visit: Payer: Self-pay | Admitting: Family Medicine

## 2009-09-29 ENCOUNTER — Telehealth: Payer: Self-pay | Admitting: Family Medicine

## 2009-10-04 ENCOUNTER — Ambulatory Visit: Payer: Self-pay | Admitting: Family Medicine

## 2009-10-04 LAB — CONVERTED CEMR LAB
INR: 2
Prothrombin Time: 17.6 s

## 2009-10-23 HISTORY — PX: BREAST EXCISIONAL BIOPSY: SUR124

## 2009-10-26 ENCOUNTER — Telehealth: Payer: Self-pay | Admitting: Family Medicine

## 2009-11-05 ENCOUNTER — Telehealth: Payer: Self-pay | Admitting: Family Medicine

## 2009-11-05 ENCOUNTER — Ambulatory Visit: Payer: Self-pay | Admitting: Family Medicine

## 2009-11-08 ENCOUNTER — Telehealth: Payer: Self-pay | Admitting: Family Medicine

## 2009-11-18 ENCOUNTER — Ambulatory Visit: Payer: Self-pay | Admitting: Family Medicine

## 2009-12-03 ENCOUNTER — Ambulatory Visit: Payer: Self-pay | Admitting: Family Medicine

## 2009-12-03 LAB — CONVERTED CEMR LAB: Prothrombin Time: 24.4 s

## 2009-12-10 ENCOUNTER — Telehealth: Payer: Self-pay | Admitting: Family Medicine

## 2009-12-17 ENCOUNTER — Ambulatory Visit: Payer: Self-pay | Admitting: Family Medicine

## 2010-01-05 ENCOUNTER — Encounter: Payer: Self-pay | Admitting: Family Medicine

## 2010-01-05 ENCOUNTER — Telehealth: Payer: Self-pay | Admitting: Family Medicine

## 2010-01-17 ENCOUNTER — Ambulatory Visit: Payer: Self-pay | Admitting: Family Medicine

## 2010-01-17 LAB — CONVERTED CEMR LAB: Prothrombin Time: 17.3 s

## 2010-02-02 ENCOUNTER — Telehealth: Payer: Self-pay | Admitting: Family Medicine

## 2010-02-14 ENCOUNTER — Ambulatory Visit: Payer: Self-pay | Admitting: Family Medicine

## 2010-02-14 LAB — CONVERTED CEMR LAB: INR: 1.6

## 2010-03-02 ENCOUNTER — Encounter: Admission: RE | Admit: 2010-03-02 | Discharge: 2010-03-02 | Payer: Self-pay | Admitting: Family Medicine

## 2010-03-02 ENCOUNTER — Ambulatory Visit: Payer: Self-pay | Admitting: Family Medicine

## 2010-03-02 LAB — CONVERTED CEMR LAB: Prothrombin Time: 20.2 s

## 2010-03-15 ENCOUNTER — Encounter: Payer: Self-pay | Admitting: Family Medicine

## 2010-03-17 ENCOUNTER — Telehealth: Payer: Self-pay | Admitting: Family Medicine

## 2010-03-24 ENCOUNTER — Telehealth: Payer: Self-pay | Admitting: Family Medicine

## 2010-03-30 ENCOUNTER — Ambulatory Visit: Payer: Self-pay | Admitting: Family Medicine

## 2010-04-08 ENCOUNTER — Encounter: Admission: RE | Admit: 2010-04-08 | Discharge: 2010-04-08 | Payer: Self-pay | Admitting: Surgery

## 2010-04-11 ENCOUNTER — Encounter: Admission: RE | Admit: 2010-04-11 | Discharge: 2010-04-11 | Payer: Self-pay | Admitting: Surgery

## 2010-04-11 ENCOUNTER — Ambulatory Visit (HOSPITAL_BASED_OUTPATIENT_CLINIC_OR_DEPARTMENT_OTHER): Admission: RE | Admit: 2010-04-11 | Discharge: 2010-04-11 | Payer: Self-pay | Admitting: Surgery

## 2010-04-27 ENCOUNTER — Ambulatory Visit: Payer: Self-pay | Admitting: Family Medicine

## 2010-04-27 LAB — CONVERTED CEMR LAB: INR: 3.9

## 2010-05-05 ENCOUNTER — Encounter: Payer: Self-pay | Admitting: Family Medicine

## 2010-05-12 ENCOUNTER — Telehealth: Payer: Self-pay | Admitting: Family Medicine

## 2010-06-03 ENCOUNTER — Ambulatory Visit: Payer: Self-pay | Admitting: Family Medicine

## 2010-06-17 ENCOUNTER — Telehealth: Payer: Self-pay | Admitting: Family Medicine

## 2010-06-21 ENCOUNTER — Ambulatory Visit: Payer: Self-pay | Admitting: Family Medicine

## 2010-07-08 ENCOUNTER — Ambulatory Visit: Payer: Self-pay | Admitting: Family Medicine

## 2010-07-28 ENCOUNTER — Ambulatory Visit: Payer: Self-pay | Admitting: Family Medicine

## 2010-07-29 ENCOUNTER — Telehealth: Payer: Self-pay | Admitting: Family Medicine

## 2010-08-08 ENCOUNTER — Ambulatory Visit: Payer: Self-pay | Admitting: Family Medicine

## 2010-09-02 ENCOUNTER — Encounter: Payer: Self-pay | Admitting: Family Medicine

## 2010-09-09 ENCOUNTER — Ambulatory Visit: Payer: Self-pay | Admitting: Family Medicine

## 2010-09-12 ENCOUNTER — Telehealth: Payer: Self-pay | Admitting: Family Medicine

## 2010-10-07 ENCOUNTER — Ambulatory Visit: Payer: Self-pay | Admitting: Family Medicine

## 2010-10-07 LAB — CONVERTED CEMR LAB: INR: 2.7

## 2010-11-04 ENCOUNTER — Ambulatory Visit
Admission: RE | Admit: 2010-11-04 | Discharge: 2010-11-04 | Payer: Self-pay | Source: Home / Self Care | Attending: Internal Medicine | Admitting: Internal Medicine

## 2010-11-04 LAB — CONVERTED CEMR LAB: INR: 3.1

## 2010-11-05 ENCOUNTER — Encounter: Payer: Self-pay | Admitting: *Deleted

## 2010-11-05 DIAGNOSIS — I824Y9 Acute embolism and thrombosis of unspecified deep veins of unspecified proximal lower extremity: Secondary | ICD-10-CM | POA: Insufficient documentation

## 2010-11-13 ENCOUNTER — Encounter: Payer: Self-pay | Admitting: Family Medicine

## 2010-11-18 ENCOUNTER — Ambulatory Visit
Admission: RE | Admit: 2010-11-18 | Discharge: 2010-11-18 | Payer: Self-pay | Source: Home / Self Care | Attending: Family Medicine | Admitting: Family Medicine

## 2010-11-22 NOTE — Progress Notes (Signed)
Summary: refill hydrocodone  Phone Note From Pharmacy   Caller: [id number] Call For: Jaime Kelley  Summary of Call: refill hydrocodone 5/500 1 by mouth 4 times daily Initial call taken by: Alfred Levins, CMA,  November 05, 2009 10:26 AM  Follow-up for Phone Call        No. We refered her to Dr. Shon Baton at [facility], so she needs to get meds form them Follow-up by: Nelwyn Salisbury MD,  November 05, 2009 1:19 PM  Additional Follow-up for Phone Call Additional follow up Details #1::        Phone call completed, Pharmacist called Additional Follow-up by: Alfred Levins, CMA,  November 05, 2009 1:21 PM

## 2010-11-22 NOTE — Progress Notes (Signed)
Summary: wants Rx  Phone Note Call from Patient   Caller: Patient Call For: Nelwyn Salisbury MD Summary of Call: co-workers sick; she has sneezing and congestion, no fever . [facility] Initial call taken by: Raechel Ache, RN,  June 17, 2010 2:13 PM  Follow-up for Phone Call        call in a Zpack Follow-up by: Nelwyn Salisbury MD,  June 17, 2010 3:36 PM  Additional Follow-up for Phone Call Additional follow up Details #1::        Rx Called In Additional Follow-up by: Raechel Ache, RN,  June 17, 2010 4:03 PM

## 2010-11-22 NOTE — Assessment & Plan Note (Signed)
Summary: PT // RS  Nurse Visit   Allergies: No Known Drug Allergies Laboratory Results   Blood Tests      INR: 2.9   (Normal Range: 0.88-1.12   Therap INR: 2.0-3.5) Comments: Alfred Levins, CMA  September 09, 2010 2:11 PM    Orders Added: 1)  Est. Patient Level I [40981] 2)  Protime [19147WG]

## 2010-11-22 NOTE — Assessment & Plan Note (Signed)
Summary: pt//ccm/pt rsc/cjr  Nurse Visit   Allergies: No Known Drug Allergies Laboratory Results   Blood Tests     PT: 17.3 s   (Normal Range: 10.6-13.4)  INR: 2.0   (Normal Range: 0.88-1.12   Therap INR: 2.0-3.5) Comments: Rita Ohara  January 17, 2010 1:53 PM     Orders Added: 1)  Est. Patient Level I [99211] 2)  Protime [53664QI]   ANTICOAGULATION RECORD PREVIOUS REGIMEN & LAB RESULTS Anticoagulation Diagnosis:  v58.83,v58.61,453.41 on  05/31/2007 Previous INR Goal Range:  2.5-3.5 on  03/20/2008 Previous INR:  2.4 on  12/17/2009 Previous Coumadin Dose(mg):  10 mg qd on  05/22/2008 Previous Regimen:  same on  12/17/2009 Previous Coagulation Comments:  Result  shown to Dr.Panosh on  01/10/2008  NEW REGIMEN & LAB RESULTS Current INR: 2.0 Regimen: same  Repeat testing in: 1 month  Anticoagulation Visit Questionnaire Coumadin dose missed/changed:  No Abnormal Bleeding Symptoms:  No  Any diet changes including alcohol intake, vegetables or greens since the last visit:  No Any illnesses or hospitalizations since the last visit:  No Any signs of clotting since the last visit (including chest discomfort, dizziness, shortness of breath, arm tingling, slurred speech, swelling or redness in leg):  No  MEDICATIONS METFORMIN HCL 500 MG TABS (METFORMIN HCL) two times a day SYNTHROID 100 MCG TABS (LEVOTHYROXINE SODIUM) once daily WARFARIN SODIUM 10 MG TABS (WARFARIN SODIUM) once daily VIVELLE-DOT 0.025 MG/24HR  PTTW (ESTRADIOL) change patch weekly MULTIVITAMINS   TABS (MULTIPLE VITAMIN) once daily CALTRATE 600+D PLUS 600-400 MG-UNIT  CHEW (CALCIUM CARBONATE-VIT D-MIN) two times a day VITAMIN D 1000 UNIT  TABS (CHOLECALCIFEROL) once daily VENTOLIN HFA 108 (90 BASE) MCG/ACT  AERS (ALBUTEROL SULFATE) 2 puffs every 4 hours as needed shortness of breath HUMIRA 20 MG/0.4ML KIT (ADALIMUMAB) inject weekly PREDNISONE 10 MG TABS (PREDNISONE) as directed SOMA 250 MG TABS  (CARISOPRODOL) 1 q 6 hours as needed spasm

## 2010-11-22 NOTE — Progress Notes (Signed)
Summary: Hydorocodone refill  Phone Note Call from Patient Call back at [phone]   Summary of Call: Patient called today b/c she was notified by her pharmacy that her Hydrocodone was refilled stating that it should be given by another MD. Per the patient she takes this prescription for pain management due to RA and it has always been given by Dr. Clent Ridges not Dr. Shon Baton. Please advise. Initial call taken by: Lucious Groves,  November 08, 2009 11:59 AM  Follow-up for Phone Call        okay, takes it q 6 hours as needed pain, call in #120 with 5 rf Follow-up by: Nelwyn Salisbury MD,  November 08, 2009 1:15 PM  Additional Follow-up for Phone Call Additional follow up Details #1::        done. Patient notified. Additional Follow-up by: Lucious Groves,  November 08, 2009 2:36 PM    New/Updated Medications: HYDROCODONE-ACETAMINOPHEN 5-500 MG  TABS (HYDROCODONE-ACETAMINOPHEN) 1 by mouth q 6 hours as needed for pain Prescriptions: HYDROCODONE-ACETAMINOPHEN 5-500 MG  TABS (HYDROCODONE-ACETAMINOPHEN) 1 by mouth q 6 hours as needed for pain  #120 x 5   Entered by:   Lucious Groves   Authorized by:   Nelwyn Salisbury MD   Signed by:   Lucious Groves on 11/08/2009   Method used:   Telephoned to ...       [id number] (retail)       [address]       [place], [place]  [address]       Ph: [phone] or [phone]       Fax: [phone]   RxID:   [id number]

## 2010-11-22 NOTE — Progress Notes (Signed)
Summary: ?last tetanus inj  Phone Note Call from Patient   Caller: Patient Summary of Call: wants to know last tetanus shot and about TB injection  Initial call taken by: Pura Spice, RN,  September 12, 2010 5:09 PM  Follow-up for Phone Call        called left mess no doucumnetation of tetanus at this office  left mess to return call if further questions Follow-up by: Pura Spice, RN,  September 12, 2010 5:10 PM

## 2010-11-22 NOTE — Assessment & Plan Note (Signed)
Summary: ? congestion//ccm   Vital Signs:  Patient profile:   60 year old female Weight:      165 pounds BMI:     30.29 O2 Sat:      90 % on Room air Temp:     98.2 degrees F oral Pulse rate:   87 / minute BP sitting:   142 / 94  (left arm) Cuff size:   regular  Vitals Entered By: Alfred Levins, CMA (November 18, 2009 10:15 AM)  O2 Flow:  Room air j  Serial Vital Signs/Assessments:                                PEF    PreRx  PostRx Time      O2 Sat  O2 Type     L/min  L/min  L/min   By           92  %   Room air                          Alfred Levins, CMA  CC: cough, congestion   History of Present Illness: Here for 3 days of stuffy head, PND, chest congestion, and coughing up yellow sputum. No fever or chest pain. Using some Prednisone and her inhaler.   Current Medications (verified): 1)  Metformin Hcl 500 Mg Tabs (Metformin Hcl) .... Two Times A Day 2)  Synthroid 100 Mcg Tabs (Levothyroxine Sodium) .... Once Daily 3)  Warfarin Sodium 10 Mg Tabs (Warfarin Sodium) .... Once Daily 4)  Hydrocodone-Acetaminophen 5-500 Mg  Tabs (Hydrocodone-Acetaminophen) .Marland Kitchen.. 1 By Mouth Q 6 Hours As Needed For Pain 5)  Vivelle-Dot 0.025 Mg/24hr  Pttw (Estradiol) .... Change Patch Weekly 6)  Multivitamins   Tabs (Multiple Vitamin) .... Once Daily 7)  Caltrate 600+d Plus 600-400 Mg-Unit  Chew (Calcium Carbonate-Vit D-Min) .... Two Times A Day 8)  Vitamin D 1000 Unit  Tabs (Cholecalciferol) .... Once Daily 9)  Ventolin Hfa 108 (90 Base) Mcg/act  Aers (Albuterol Sulfate) .... 2 Puffs Every 4 Hours As Needed Shortness of Breath 10)  Humira 20 Mg/0.41ml Kit (Adalimumab) .... Inject Weekly 11)  Prednisone 10 Mg Tabs (Prednisone) .... As Directed 12)  Soma 250 Mg Tabs (Carisoprodol) .Marland Kitchen.. 1 Q 6 Hours As Needed Spasm  Allergies (verified): No Known Drug Allergies  Past History:  Past Medical History: Reviewed history from 02/10/2009 and no changes required. GERD COPD Rheumatoid arthritis, sees  Dr. Jimmy Footman Pulmonary embolism, hx of Anticoagulation therapy Colonic polyps, hx of Hypothyroidism discoid lupus, sees Dr. Terri Piedra  Review of Systems  The patient denies anorexia, fever, weight loss, weight gain, vision loss, decreased hearing, hoarseness, chest pain, syncope, dyspnea on exertion, peripheral edema, headaches, hemoptysis, abdominal pain, melena, hematochezia, severe indigestion/heartburn, hematuria, incontinence, genital sores, muscle weakness, suspicious skin lesions, transient blindness, difficulty walking, depression, unusual weight change, abnormal bleeding, enlarged lymph nodes, angioedema, breast masses, and testicular masses.    Physical Exam  General:  Well-developed,well-nourished,in no acute distress; alert,appropriate and cooperative throughout examination Head:  Normocephalic and atraumatic without obvious abnormalities. No apparent alopecia or balding. Eyes:  No corneal or conjunctival inflammation noted. EOMI. Perrla. Funduscopic exam benign, without hemorrhages, exudates or papilledema. Vision grossly normal. Ears:  External ear exam shows no significant lesions or deformities.  Otoscopic examination reveals clear canals, tympanic membranes are intact bilaterally without bulging, retraction, inflammation or discharge. Hearing is grossly  normal bilaterally. Nose:  External nasal examination shows no deformity or inflammation. Nasal mucosa are pink and moist without lesions or exudates. Mouth:  Oral mucosa and oropharynx without lesions or exudates.  Teeth in good repair. Neck:  No deformities, masses, or tenderness noted. Lungs:  soft scattered rhonchi, no rales   Impression & Recommendations:  Problem # 1:  BRONCHITIS, ACUTE (ICD-466.0)  Her updated medication list for this problem includes:    Ventolin Hfa 108 (90 Base) Mcg/act Aers (Albuterol sulfate) .Marland Kitchen... 2 puffs every 4 hours as needed shortness of breath    Augmentin 875-125 Mg Tabs (Amoxicillin-pot  clavulanate) .Marland Kitchen..Marland Kitchen Two times a day  Orders: Ipratropium inhalation sol. unit dose (Z6109) Albuterol Sulfate Sol 1mg  unit dose (U0454) Nebulizer Tx (09811)  Complete Medication List: 1)  Metformin Hcl 500 Mg Tabs (Metformin hcl) .... Two times a day 2)  Synthroid 100 Mcg Tabs (Levothyroxine sodium) .... Once daily 3)  Warfarin Sodium 10 Mg Tabs (Warfarin sodium) .... Once daily 4)  Hydrocodone-acetaminophen 5-500 Mg Tabs (Hydrocodone-acetaminophen) .Marland Kitchen.. 1 by mouth q 6 hours as needed for pain 5)  Vivelle-dot 0.025 Mg/24hr Pttw (Estradiol) .... Change patch weekly 6)  Multivitamins Tabs (Multiple vitamin) .... Once daily 7)  Caltrate 600+d Plus 600-400 Mg-unit Chew (Calcium carbonate-vit d-min) .... Two times a day 8)  Vitamin D 1000 Unit Tabs (Cholecalciferol) .... Once daily 9)  Ventolin Hfa 108 (90 Base) Mcg/act Aers (Albuterol sulfate) .... 2 puffs every 4 hours as needed shortness of breath 10)  Humira 20 Mg/0.86ml Kit (Adalimumab) .... Inject weekly 11)  Prednisone 10 Mg Tabs (Prednisone) .... As directed 12)  Soma 250 Mg Tabs (Carisoprodol) .Marland Kitchen.. 1 q 6 hours as needed spasm 13)  Augmentin 875-125 Mg Tabs (Amoxicillin-pot clavulanate) .... Two times a day  Patient Instructions: 1)  Please schedule a follow-up appointment as needed .  Prescriptions: AUGMENTIN 875-125 MG TABS (AMOXICILLIN-POT CLAVULANATE) two times a day  #20 x 0   Entered and Authorized by:   Nelwyn Salisbury MD   Signed by:   Nelwyn Salisbury MD on 11/18/2009   Method used:   Electronically to        [id number] (retail)       [address]       [place], [place]  [address]       Ph: [phone] or [phone]       Fax: [phone]   RxID:   [id number]    Medication Administration  Medication # 1:    Medication: Ipratropium inhalation sol. unit dose    Diagnosis: BRONCHITIS, ACUTE (ICD-466.0)    Dose: 0.5mg     Route: inhaled    Exp Date: 5/12    Lot #: [id number]    Mfr: Nephron pharmacutical     Patient tolerated medication without complications    Given by: Alfred Levins, CMA (November 18, 2009 10:37 AM)  Medication # 2:    Medication: Albuterol Sulfate Sol 1mg  unit dose    Diagnosis: BRONCHITIS, ACUTE (ICD-466.0)    Dose: 2.5mg     Route: inhaled    Exp Date: 6/12    Lot #: [id number]    Mfr: Nephron Pharmacutical    Patient tolerated medication without complications    Given by: Alfred Levins, CMA (November 18, 2009 10:38 AM)  Orders Added: 1)  Ipratropium inhalation sol. unit dose [J7644] 2)  Albuterol Sulfate Sol 1mg  unit dose [J7613] 3)  Nebulizer Tx [94640] 4)  Est. Patient Level IV [75643]  Appended Document: ? congestion//ccm   ANTICOAGULATION RECORD PREVIOUS REGIMEN & LAB RESULTS Anticoagulation Diagnosis:  v58.83,v58.61,453.41 on  05/31/2007 Previous INR Goal Range:  2.5-3.5 on  03/20/2008 Previous INR:  2.0 on  11/05/2009 Previous Coumadin Dose(mg):  10 mg qd on  05/22/2008 Previous Regimen:  15mg . Mon. & Fri. 10mg . others on  11/05/2009 Previous Coagulation Comments:  Result  shown to Dr.Panosh on  01/10/2008  NEW REGIMEN & LAB RESULTS Current INR: 3.9 Regimen: same  Repeat testing in: 2 weeks  Anticoagulation Visit Questionnaire Coumadin dose missed/changed:  No Abnormal Bleeding Symptoms:  No  Any diet changes including alcohol intake, vegetables or greens since the last visit:  No Any illnesses or hospitalizations since the last visit:  No Any signs of clotting since the last visit (including chest discomfort, dizziness, shortness of breath, arm tingling, slurred speech, swelling or redness in leg):  No  MEDICATIONS METFORMIN HCL 500 MG TABS (METFORMIN HCL) two times a day SYNTHROID 100 MCG TABS (LEVOTHYROXINE SODIUM) once daily WARFARIN SODIUM 10 MG TABS (WARFARIN SODIUM) once daily HYDROCODONE-ACETAMINOPHEN 5-500 MG  TABS (HYDROCODONE-ACETAMINOPHEN) 1 by mouth q 6 hours as needed for pain VIVELLE-DOT 0.025 MG/24HR  PTTW (ESTRADIOL) change patch  weekly MULTIVITAMINS   TABS (MULTIPLE VITAMIN) once daily CALTRATE 600+D PLUS 600-400 MG-UNIT  CHEW (CALCIUM CARBONATE-VIT D-MIN) two times a day VITAMIN D 1000 UNIT  TABS (CHOLECALCIFEROL) once daily VENTOLIN HFA 108 (90 BASE) MCG/ACT  AERS (ALBUTEROL SULFATE) 2 puffs every 4 hours as needed shortness of breath HUMIRA 20 MG/0.4ML KIT (ADALIMUMAB) inject weekly PREDNISONE 10 MG TABS (PREDNISONE) as directed SOMA 250 MG TABS (CARISOPRODOL) 1 q 6 hours as needed spasm AUGMENTIN 875-125 MG TABS (AMOXICILLIN-POT CLAVULANATE) two times a day    Laboratory Results   Blood Tests     PT: 23.8 s   (Normal Range: 10.6-13.4)  INR: 3.9   (Normal Range: 0.88-1.12   Therap INR: 2.0-3.5) Comments: Rita Ohara  November 18, 2009 11:20 AM        Appended Document: ? congestion//ccm     Preventive Screening-Counseling & Management  Alcohol-Tobacco     Smoking Status: current  Allergies: No Known Drug Allergies  Family History: Reviewed history and no changes required. Family History of Arthritis Family History of CAD Female 1st degree relative <50 Family History Hypertension Family History Lung cancer Family History of Stroke M 1st degree relative <50  Social History: Reviewed history and no changes required. 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 Two times a day 2)  Synthroid 100 Mcg Tabs (Levothyroxine sodium) .... Once daily 3)  Warfarin Sodium 10 Mg Tabs (Warfarin sodium) .... Once daily 4)  Hydrocodone-acetaminophen 5-500 Mg Tabs (Hydrocodone-acetaminophen) .Marland Kitchen.. 1 by mouth q 6 hours as needed for pain 5)  Vivelle-dot 0.025 Mg/24hr Pttw (Estradiol) .... Change patch weekly 6)  Multivitamins Tabs (Multiple vitamin) .... Once daily 7)  Caltrate 600+d Plus 600-400 Mg-unit Chew (Calcium carbonate-vit d-min) .... Two times  a day 8)  Vitamin D 1000 Unit Tabs (Cholecalciferol) .... Once daily 9)  Ventolin Hfa 108 (90 Base) Mcg/act Aers (Albuterol sulfate) .... 2 puffs every 4 hours as needed shortness of breath 10)  Humira 20 Mg/0.26ml Kit (Adalimumab) .... Inject weekly 11)  Prednisone 10 Mg Tabs (Prednisone) .... As directed 12)  Soma 250 Mg Tabs (Carisoprodol) .Marland Kitchen.. 1 q 6 hours as needed spasm  13)  Augmentin 875-125 Mg Tabs (Amoxicillin-pot clavulanate) .... Two times a day

## 2010-11-22 NOTE — Assessment & Plan Note (Signed)
Summary: PT // RS  Nurse Visit   Allergies: No Known Drug Allergies  Orders Added: 1)  Est. Patient Level I [08657] 2)  Protime [84696EX]   ANTICOAGULATION RECORD PREVIOUS REGIMEN & LAB RESULTS Anticoagulation Diagnosis:  v58.83,v58.61,453.41 on  05/31/2007 Previous INR Goal Range:  2.5-3.5 on  03/20/2008 Previous INR:  3.2 on  07/08/2010 Previous Coumadin Dose(mg):  15mg  on mon & fri 10mg  on other days on  04/27/2010 Previous Regimen:  same on  07/08/2010 Previous Coagulation Comments:  Hold for 1 Day on  04/27/2010  NEW REGIMEN & LAB RESULTS Regimen: same  Repeat testing in: 4 weeks  Anticoagulation Visit Questionnaire Coumadin dose missed/changed:  No Abnormal Bleeding Symptoms:  No  Any diet changes including alcohol intake, vegetables or greens since the last visit:  No Any illnesses or hospitalizations since the last visit:  No Any signs of clotting since the last visit (including chest discomfort, dizziness, shortness of breath, arm tingling, slurred speech, swelling or redness in leg):  No  MEDICATIONS METFORMIN HCL 500 MG TABS (METFORMIN HCL) two times a day SYNTHROID 100 MCG TABS (LEVOTHYROXINE SODIUM) once daily WARFARIN SODIUM 10 MG TABS (WARFARIN SODIUM) once daily VIVELLE-DOT 0.025 MG/24HR  PTTW (ESTRADIOL) change patch weekly MULTIVITAMINS   TABS (MULTIPLE VITAMIN) once daily CALTRATE 600+D PLUS 600-400 MG-UNIT  CHEW (CALCIUM CARBONATE-VIT D-MIN) two times a day VITAMIN D 1000 UNIT  TABS (CHOLECALCIFEROL) once daily VENTOLIN HFA 108 (90 BASE) MCG/ACT  AERS (ALBUTEROL SULFATE) 2 puffs every 4 hours as needed shortness of breath HUMIRA 20 MG/0.4ML KIT (ADALIMUMAB) inject weekly PREDNISONE 10 MG TABS (PREDNISONE) as directed SOMA 250 MG TABS (CARISOPRODOL) 1 q 6 hours as needed spasm VICODIN 5-500 MG TABS (HYDROCODONE-ACETAMINOPHEN) 1 q6h as needed * MEXOTRATE 0.6 CC  INJECT WEEKLY  LEVAQUIN 500 MG TABS (LEVOFLOXACIN) 1 by mouth once daily for 10 days

## 2010-11-22 NOTE — Assessment & Plan Note (Signed)
Summary: pt/njr  Nurse Visit   Allergies: No Known Drug Allergies Laboratory Results   Blood Tests   Date/Time Received: April 27, 2010 1:06 PM  Date/Time Reported: April 27, 2010 1:06 PM    INR: 3.9   (Normal Range: 0.88-1.12   Therap INR: 2.0-3.5) Comments: Wynona Canes, CMA  April 27, 2010 1:07 PM     Orders Added: 1)  Est. Patient Level I [99211] 2)  Protime [16109UE]  Laboratory Results   Blood Tests      INR: 3.9   (Normal Range: 0.88-1.12   Therap INR: 2.0-3.5) Comments: Wynona Canes, CMA  April 27, 2010 1:07 PM       ANTICOAGULATION RECORD PREVIOUS REGIMEN & LAB RESULTS Anticoagulation Diagnosis:  v58.83,v58.61,453.41 on  05/31/2007 Previous INR Goal Range:  2.5-3.5 on  03/20/2008 Previous INR:  2.9 on  03/30/2010 Previous Coumadin Dose(mg):  5mg  on mon,thu then 10mg  on other days on  03/30/2010 Previous Regimen:  same on  03/02/2010 Previous Coagulation Comments:  Result  shown to Dr.Panosh on  01/10/2008  NEW REGIMEN & LAB RESULTS Current INR: 3.9 Current Coumadin Dose(mg): 15mg  on mon & fri 10mg  on other days Regimen: same  (no change) Coagulation Comments: Hold for 1 Day      Repeat testing in: 4 weeks MEDICATIONS METFORMIN HCL 500 MG TABS (METFORMIN HCL) two times a day SYNTHROID 100 MCG TABS (LEVOTHYROXINE SODIUM) once daily WARFARIN SODIUM 10 MG TABS (WARFARIN SODIUM) once daily VIVELLE-DOT 0.025 MG/24HR  PTTW (ESTRADIOL) change patch weekly MULTIVITAMINS   TABS (MULTIPLE VITAMIN) once daily CALTRATE 600+D PLUS 600-400 MG-UNIT  CHEW (CALCIUM CARBONATE-VIT D-MIN) two times a day VITAMIN D 1000 UNIT  TABS (CHOLECALCIFEROL) once daily VENTOLIN HFA 108 (90 BASE) MCG/ACT  AERS (ALBUTEROL SULFATE) 2 puffs every 4 hours as needed shortness of breath HUMIRA 20 MG/0.4ML KIT (ADALIMUMAB) inject weekly PREDNISONE 10 MG TABS (PREDNISONE) as directed SOMA 250 MG TABS (CARISOPRODOL) 1 q 6 hours as needed spasm   Anticoagulation Visit  Questionnaire      Coumadin dose missed/changed:  No      Abnormal Bleeding Symptoms:  No   Any diet changes including alcohol intake, vegetables or greens since the last visit:  No Any illnesses or hospitalizations since the last visit:  No Any signs of clotting since the last visit (including chest discomfort, dizziness, shortness of breath, arm tingling, slurred speech, swelling or redness in leg):  No

## 2010-11-22 NOTE — Progress Notes (Signed)
Summary: refill Vicodin  Phone Note Refill Request Message from:  Fax from Pharmacy on May 12, 2010 8:27 AM  Refills Requested: Medication #1:  Vicodin 5-500   Last Refilled: 04/07/2010   Notes: #120  Method Requested: Fax to Local Pharmacy Initial call taken by: Raechel Ache, RN,  May 12, 2010 8:28 AM [facility]er: [facility]  Follow-up for Phone Call        call in #120 with 5 rf Follow-up by: Nelwyn Salisbury MD,  May 12, 2010 8:46 AM  Additional Follow-up for Phone Call Additional follow up Details #1::        Rx faxed to pharmacy Additional Follow-up by: Raechel Ache, RN,  May 12, 2010 8:50 AM    New/Updated Medications: VICODIN 5-500 MG TABS (HYDROCODONE-ACETAMINOPHEN) 1 q6h as needed Prescriptions: VICODIN 5-500 MG TABS (HYDROCODONE-ACETAMINOPHEN) 1 q6h as needed  #120 x 5   Entered by:   Raechel Ache, RN   Authorized by:   Nelwyn Salisbury MD   Signed by:   Raechel Ache, RN on 05/12/2010   Method used:   Historical   RxID:   [id number]

## 2010-11-22 NOTE — Letter (Signed)
Summary: [facility]   Imported By: Maryln Gottron 05/16/2010 11:06:45  _____________________________________________________________________  External Attachment:    Type:   Image     Comment:   External Document

## 2010-11-22 NOTE — Assessment & Plan Note (Signed)
Summary: PT/NJR  Nurse Visit   Allergies: No Known Drug Allergies Laboratory Results   Blood Tests     PT: 24.4 s   (Normal Range: 10.6-13.4)  INR: 4.0   (Normal Range: 0.88-1.12   Therap INR: 2.0-3.5) Comments: Rita Ohara  December 03, 2009 2:02 PM     Orders Added: 1)  Est. Patient Level I [99211] 2)  Protime [[id number]]   ANTICOAGULATION RECORD PREVIOUS REGIMEN & LAB RESULTS Anticoagulation Diagnosis:  v58.83,v58.61,453.41 on  05/31/2007 Previous INR Goal Range:  2.5-3.5 on  03/20/2008 Previous INR:  3.9 on  11/18/2009 Previous Coumadin Dose(mg):  10 mg qd on  05/22/2008 Previous Regimen:  same on  11/18/2009 Previous Coagulation Comments:  Result  shown to Dr.Panosh on  01/10/2008  NEW REGIMEN & LAB RESULTS Current INR: 4.0 Regimen: 15mg . Mon. others 10mg .  Repeat testing in: 2 weeks  Anticoagulation Visit Questionnaire Coumadin dose missed/changed:  No Abnormal Bleeding Symptoms:  No  Any diet changes including alcohol intake, vegetables or greens since the last visit:  No Any illnesses or hospitalizations since the last visit:  No Any signs of clotting since the last visit (including chest discomfort, dizziness, shortness of breath, arm tingling, slurred speech, swelling or redness in leg):  No  MEDICATIONS METFORMIN HCL 500 MG TABS (METFORMIN HCL) two times a day SYNTHROID 100 MCG TABS (LEVOTHYROXINE SODIUM) once daily WARFARIN SODIUM 10 MG TABS (WARFARIN SODIUM) once daily HYDROCODONE-ACETAMINOPHEN 5-500 MG  TABS (HYDROCODONE-ACETAMINOPHEN) 1 by mouth q 6 hours as needed for pain VIVELLE-DOT 0.025 MG/24HR  PTTW (ESTRADIOL) change patch weekly MULTIVITAMINS   TABS (MULTIPLE VITAMIN) once daily CALTRATE 600+D PLUS 600-400 MG-UNIT  CHEW (CALCIUM CARBONATE-VIT D-MIN) two times a day VITAMIN D 1000 UNIT  TABS (CHOLECALCIFEROL) once daily VENTOLIN HFA 108 (90 BASE) MCG/ACT  AERS (ALBUTEROL SULFATE) 2 puffs every 4 hours as needed shortness of  breath HUMIRA 20 MG/0.4ML KIT (ADALIMUMAB) inject weekly PREDNISONE 10 MG TABS (PREDNISONE) as directed SOMA 250 MG TABS (CARISOPRODOL) 1 q 6 hours as needed spasm

## 2010-11-22 NOTE — Progress Notes (Signed)
Summary: Pt req antibiotic [facility] in [facility]  Phone Note Call from Patient Call back at [facility] Phone [phone] Call back at [phone]     Caller: Patient Summary of Call: Pt called and has chest congestion,sorethroat,earache,headache. [facility] [phone] Initial call taken by: Lucy Antigua,  October 26, 2009 2:50 PM  Follow-up for Phone Call        call in a [employer] Follow-up by: Nelwyn Salisbury MD,  October 27, 2009 10:57 AM  Additional Follow-up for Phone Call Additional follow up Details #1::        Phone Call Completed, Rx Called In Additional Follow-up by: Alfred Levins, CMA,  October 27, 2009 10:59 AM    New/Updated Medications: ZITHROMAX Z-PAK 250 MG  TABS (AZITHROMYCIN) Use as directed Prescriptions: ZITHROMAX Z-PAK 250 MG  TABS (AZITHROMYCIN) Use as directed  #1 x 0   Entered by:   Alfred Levins, CMA   Authorized by:   Nelwyn Salisbury MD   Signed by:   Alfred Levins, CMA on 10/27/2009   Method used:   Electronically to        [facility] (retail)       [address]       [place], [place]  [address]       Ph: [phone] or [phone]       Fax: [phone]   RxID:   [id number]

## 2010-11-22 NOTE — Progress Notes (Signed)
Summary: levaquin request  Phone Note Call from Patient Call back at Work Phone [phone]   Summary of Call: Sore throat.  Head & chest congestion.  Coughing up green junk.  No fever.  Started Prednsione regimen this am.  Requesting antibiotic again, Levaquin.  [facility].   Initial call taken by: Rudy Jew, RN,  July 29, 2010 9:07 AM  Follow-up for Phone Call        call in Levaquin 500 mg once daily for 10 days  Follow-up by: Nelwyn Salisbury MD,  July 29, 2010 2:13 PM  Additional Follow-up for Phone Call Additional follow up Details #1::        done  pt aware.  Additional Follow-up by: Pura Spice, RN,  July 29, 2010 3:24 PM    New/Updated Medications: LEVAQUIN 500 MG TABS (LEVOFLOXACIN) 1 by mouth once daily for 10 days Prescriptions: LEVAQUIN 500 MG TABS (LEVOFLOXACIN) 1 by mouth once daily for 10 days  #10 x 0   Entered by:   Pura Spice, RN   Authorized by:   Nelwyn Salisbury MD   Signed by:   Pura Spice, RN on 07/29/2010   Method used:   Electronically to        [facility] ([facility]       [address]       [place], [place]  [address]       Ph: [phone] or [phone]       Fax: [phone]   RxID:   [id number]

## 2010-11-22 NOTE — Assessment & Plan Note (Signed)
Summary: protime//ccm  Nurse Visit   Allergies: No Known Drug Allergies Laboratory Results   Blood Tests     PT: 17.3 s   (Normal Range: 10.6-13.4)  INR: 2.0   (Normal Range: 0.88-1.12   Therap INR: 2.0-3.5) Comments: Rita Ohara  November 05, 2009 1:49 PM     Orders Added: 1)  Est. Patient Level I [99211] 2)  Protime [[id number]]   ANTICOAGULATION RECORD PREVIOUS REGIMEN & LAB RESULTS Anticoagulation Diagnosis:  v58.83,v58.61,453.41 on  05/31/2007 Previous INR Goal Range:  2.5-3.5 on  03/20/2008 Previous INR:  2.0 on  10/04/2009 Previous Coumadin Dose(mg):  10 mg qd on  05/22/2008 Previous Regimen:  same on  09/06/2009 Previous Coagulation Comments:  Result  shown to Dr.Panosh on  01/10/2008  NEW REGIMEN & LAB RESULTS Current INR: 2.0 Regimen: 15mg . Mon. & Fri. 10mg . others  Repeat testing in: 2 weeks  Anticoagulation Visit Questionnaire Coumadin dose missed/changed:  No Abnormal Bleeding Symptoms:  No  Any diet changes including alcohol intake, vegetables or greens since the last visit:  No Any illnesses or hospitalizations since the last visit:  No Any signs of clotting since the last visit (including chest discomfort, dizziness, shortness of breath, arm tingling, slurred speech, swelling or redness in leg):  No  MEDICATIONS METFORMIN HCL 500 MG TABS (METFORMIN HCL) two times a day SYNTHROID 100 MCG TABS (LEVOTHYROXINE SODIUM) once daily WARFARIN SODIUM 10 MG TABS (WARFARIN SODIUM) once daily HYDROCODONE-ACETAMINOPHEN 5-500 MG  TABS (HYDROCODONE-ACETAMINOPHEN) qid as needed VIVELLE-DOT 0.025 MG/24HR  PTTW (ESTRADIOL) change patch weekly MULTIVITAMINS   TABS (MULTIPLE VITAMIN) once daily CALTRATE 600+D PLUS 600-400 MG-UNIT  CHEW (CALCIUM CARBONATE-VIT D-MIN) two times a day VITAMIN D 1000 UNIT  TABS (CHOLECALCIFEROL) once daily VENTOLIN HFA 108 (90 BASE) MCG/ACT  AERS (ALBUTEROL SULFATE) 2 puffs every 4 hours as needed shortness of breath HUMIRA 20 MG/0.4ML  KIT (ADALIMUMAB) inject weekly PREDNISONE 10 MG TABS (PREDNISONE) as directed SOMA 250 MG TABS (CARISOPRODOL) 1 q 6 hours as needed spasm

## 2010-11-22 NOTE — Progress Notes (Signed)
Summary: advice  Phone Note Call from Patient Call back at Home Phone [phone]   Caller: Patient Call For: fry Summary of Call: [facility] and she is suppose to have it done next friday and they adivised her to stop taking her coumadin 7 days earlier.  She takes 15mg  on Monday and 10mg  all others.  Do you feel that is ok? Initial call taken by: Alfred Levins, CMA,  December 10, 2009 4:00 PM  Follow-up for Phone Call        yes I think this would be fine  Follow-up by: Nelwyn Salisbury MD,  December 10, 2009 5:18 PM  Additional Follow-up for Phone Call Additional follow up Details #1::        pt aware Additional Follow-up by: Alfred Levins, CMA,  December 13, 2009 8:29 AM

## 2010-11-22 NOTE — Assessment & Plan Note (Signed)
Summary: flu shot//ccm  Nurse Visit   Allergies: No Known Drug Allergies  Orders Added: 1)  Admin 1st Vaccine [90471] 2)  Flu Vaccine 37yrs + [29562] Flu Vaccine Consent Questions     Do you have a history of severe allergic reactions to this vaccine? no    Any prior history of allergic reactions to egg and/or gelatin? no    Do you have a sensitivity to the preservative Thimersol? no    Do you have a past history of Guillan-Barre Syndrome? no    Do you currently have an acute febrile illness? no    Have you ever had a severe reaction to latex? no    Vaccine information given and explained to patient? yes    Are you currently pregnant? no    Lot Number:[id number]   Exp Date:04/22/2011   Site Given  Left Deltoid IM .lbflu1

## 2010-11-22 NOTE — Assessment & Plan Note (Signed)
Summary: pt/njr  Nurse Visit   Allergies: No Known Drug Allergies Laboratory Results   Blood Tests     PT: 19.0 s   (Normal Range: 10.6-13.4)  INR: 2.4   (Normal Range: 0.88-1.12   Therap INR: 2.0-3.5) Comments: Rita Ohara  December 17, 2009 1:42 PM     Orders Added: 1)  Est. Patient Level I [99211] 2)  Protime [[id number]]   ANTICOAGULATION RECORD PREVIOUS REGIMEN & LAB RESULTS Anticoagulation Diagnosis:  v58.83,v58.61,453.41 on  05/31/2007 Previous INR Goal Range:  2.5-3.5 on  03/20/2008 Previous INR:  4.0 on  12/03/2009 Previous Coumadin Dose(mg):  10 mg qd on  05/22/2008 Previous Regimen:  15mg . Mon. others 10mg . on  12/03/2009 Previous Coagulation Comments:  Result  shown to Dr.Panosh on  01/10/2008  NEW REGIMEN & LAB RESULTS Current INR: 2.4 Regimen: same  Repeat testing in: 1 month  Anticoagulation Visit Questionnaire Coumadin dose missed/changed:  No Abnormal Bleeding Symptoms:  No  Any diet changes including alcohol intake, vegetables or greens since the last visit:  No Any illnesses or hospitalizations since the last visit:  No Any signs of clotting since the last visit (including chest discomfort, dizziness, shortness of breath, arm tingling, slurred speech, swelling or redness in leg):  No  MEDICATIONS METFORMIN HCL 500 MG TABS (METFORMIN HCL) two times a day SYNTHROID 100 MCG TABS (LEVOTHYROXINE SODIUM) once daily WARFARIN SODIUM 10 MG TABS (WARFARIN SODIUM) once daily VIVELLE-DOT 0.025 MG/24HR  PTTW (ESTRADIOL) change patch weekly MULTIVITAMINS   TABS (MULTIPLE VITAMIN) once daily CALTRATE 600+D PLUS 600-400 MG-UNIT  CHEW (CALCIUM CARBONATE-VIT D-MIN) two times a day VITAMIN D 1000 UNIT  TABS (CHOLECALCIFEROL) once daily VENTOLIN HFA 108 (90 BASE) MCG/ACT  AERS (ALBUTEROL SULFATE) 2 puffs every 4 hours as needed shortness of breath HUMIRA 20 MG/0.4ML KIT (ADALIMUMAB) inject weekly PREDNISONE 10 MG TABS (PREDNISONE) as directed SOMA 250 MG TABS  (CARISOPRODOL) 1 q 6 hours as needed spasm

## 2010-11-22 NOTE — Assessment & Plan Note (Signed)
Summary: pt//ccm  Nurse Visit   Allergies: No Known Drug Allergies Laboratory Results   Blood Tests     PT: 20.2 s   (Normal Range: 10.6-13.4)  INR: 2.8   (Normal Range: 0.88-1.12   Therap INR: 2.0-3.5) Comments: Rita Ohara  Mar 02, 2010 12:29 PM     Orders Added: 1)  Est. Patient Level I [99211] 2)  Protime [[id number]]   ANTICOAGULATION RECORD PREVIOUS REGIMEN & LAB RESULTS Anticoagulation Diagnosis:  v58.83,v58.61,453.41 on  05/31/2007 Previous INR Goal Range:  2.5-3.5 on  03/20/2008 Previous INR:  1.6 on  02/14/2010 Previous Coumadin Dose(mg):  10 mg qd on  05/22/2008 Previous Regimen:  15mg . M, F all others 10mg . on  02/14/2010 Previous Coagulation Comments:  Result  shown to Dr.Panosh on  01/10/2008  NEW REGIMEN & LAB RESULTS Current INR: 2.8 Regimen: same  Repeat testing in: 1 month  Anticoagulation Visit Questionnaire Coumadin dose missed/changed:  No Abnormal Bleeding Symptoms:  No  Any diet changes including alcohol intake, vegetables or greens since the last visit:  No Any illnesses or hospitalizations since the last visit:  No Any signs of clotting since the last visit (including chest discomfort, dizziness, shortness of breath, arm tingling, slurred speech, swelling or redness in leg):  No  MEDICATIONS METFORMIN HCL 500 MG TABS (METFORMIN HCL) two times a day SYNTHROID 100 MCG TABS (LEVOTHYROXINE SODIUM) once daily WARFARIN SODIUM 10 MG TABS (WARFARIN SODIUM) once daily VIVELLE-DOT 0.025 MG/24HR  PTTW (ESTRADIOL) change patch weekly MULTIVITAMINS   TABS (MULTIPLE VITAMIN) once daily CALTRATE 600+D PLUS 600-400 MG-UNIT  CHEW (CALCIUM CARBONATE-VIT D-MIN) two times a day VITAMIN D 1000 UNIT  TABS (CHOLECALCIFEROL) once daily VENTOLIN HFA 108 (90 BASE) MCG/ACT  AERS (ALBUTEROL SULFATE) 2 puffs every 4 hours as needed shortness of breath HUMIRA 20 MG/0.4ML KIT (ADALIMUMAB) inject weekly PREDNISONE 10 MG TABS (PREDNISONE) as directed SOMA 250 MG  TABS (CARISOPRODOL) 1 q 6 hours as needed spasm

## 2010-11-22 NOTE — Progress Notes (Signed)
Summary: needs ok from Dr Clent Ridges  Phone Note Call from Patient Call back at Work Phone [phone] Call back at [phone]   Caller: Patient Call For: Jaime Salisbury MD Summary of Call: having R breast lumpectomy 6/20, needs your ok to stop coumadin 5 days prior to surgery. Initial call taken by: VM  Follow-up for Phone Call        yes this is fine Follow-up by: Jaime Salisbury MD,  Mar 18, 2010 8:20 AM  Additional Follow-up for Phone Call Additional follow up Details #1::        Phone Call Completed Additional Follow-up by: Raechel Ache, RN,  Mar 18, 2010 9:04 AM

## 2010-11-22 NOTE — Assessment & Plan Note (Signed)
Summary: pt/njr  Nurse Visit   Allergies: No Known Drug Allergies Laboratory Results   Blood Tests   Date/Time Received: March 30, 2010 12:20 PM  Date/Time Reported: March 30, 2010 12:20 PM    INR: 2.9   (Normal Range: 0.88-1.12   Therap INR: 2.0-3.5) Comments: Wynona Canes, CMA  March 30, 2010 12:20 PM        Orders Added: 1)  Est. Patient Level I [99211] 2)  Protime [[id number]]   ANTICOAGULATION RECORD PREVIOUS REGIMEN & LAB RESULTS Anticoagulation Diagnosis:  v58.83,v58.61,453.41 on  05/31/2007 Previous INR Goal Range:  2.5-3.5 on  03/20/2008 Previous INR:  2.8 on  03/02/2010 Previous Coumadin Dose(mg):  10 mg qd on  05/22/2008 Previous Regimen:  same on  03/02/2010 Previous Coagulation Comments:  Result  shown to Dr.Panosh on  01/10/2008  NEW REGIMEN & LAB RESULTS Current INR: 2.9 Current Coumadin Dose(mg): 5mg  on mon,thu then 10mg  on other days Regimen: same  (no change)       Repeat testing in: 4 weeks MEDICATIONS METFORMIN HCL 500 MG TABS (METFORMIN HCL) two times a day SYNTHROID 100 MCG TABS (LEVOTHYROXINE SODIUM) once daily WARFARIN SODIUM 10 MG TABS (WARFARIN SODIUM) once daily VIVELLE-DOT 0.025 MG/24HR  PTTW (ESTRADIOL) change patch weekly MULTIVITAMINS   TABS (MULTIPLE VITAMIN) once daily CALTRATE 600+D PLUS 600-400 MG-UNIT  CHEW (CALCIUM CARBONATE-VIT D-MIN) two times a day VITAMIN D 1000 UNIT  TABS (CHOLECALCIFEROL) once daily VENTOLIN HFA 108 (90 BASE) MCG/ACT  AERS (ALBUTEROL SULFATE) 2 puffs every 4 hours as needed shortness of breath HUMIRA 20 MG/0.4ML KIT (ADALIMUMAB) inject weekly PREDNISONE 10 MG TABS (PREDNISONE) as directed SOMA 250 MG TABS (CARISOPRODOL) 1 q 6 hours as needed spasm   Anticoagulation Visit Questionnaire      Coumadin dose missed/changed:  No      Abnormal Bleeding Symptoms:  No   Any diet changes including alcohol intake, vegetables or greens since the last visit:  No Any illnesses or hospitalizations since the  last visit:  No Any signs of clotting since the last visit (including chest discomfort, dizziness, shortness of breath, arm tingling, slurred speech, swelling or redness in leg):  No  Laboratory Results   Blood Tests      INR: 2.9   (Normal Range: 0.88-1.12   Therap INR: 2.0-3.5) Comments: Wynona Canes, CMA  March 30, 2010 12:20 PM

## 2010-11-22 NOTE — Progress Notes (Signed)
Summary: bronchitis  Phone Note Call from Patient   Caller: Patient Call For: Nelwyn Salisbury MD Summary of Call: C/o "bronchitis", wheezing, prod cough with yellow phlegm. [facility] Initial call taken by: Raechel Ache, RN,  January 05, 2010 4:11 PM  Follow-up for Phone Call        call in Augmentin [phone] two times a day for 10 days Follow-up by: Nelwyn Salisbury MD,  January 05, 2010 5:26 PM    2

## 2010-11-22 NOTE — Assessment & Plan Note (Signed)
Summary: PT // RS  Nurse Visit   Allergies: No Known Drug Allergies Laboratory Results   Blood Tests     PT: 15.5 s   (Normal Range: 10.6-13.4)  INR: 1.6   (Normal Range: 0.88-1.12   Therap INR: 2.0-3.5) Comments: Rita Ohara  February 14, 2010 2:04 PM     Orders Added: 1)  Est. Patient Level I [99211] 2)  Protime [[id number]]   ANTICOAGULATION RECORD PREVIOUS REGIMEN & LAB RESULTS Anticoagulation Diagnosis:  v58.83,v58.61,453.41 on  05/31/2007 Previous INR Goal Range:  2.5-3.5 on  03/20/2008 Previous INR:  2.0 on  01/17/2010 Previous Coumadin Dose(mg):  10 mg qd on  05/22/2008 Previous Regimen:  same on  01/17/2010 Previous Coagulation Comments:  Result  shown to Dr.Panosh on  01/10/2008  NEW REGIMEN & LAB RESULTS Current INR: 1.6 Regimen: 15mg . M, F all others 10mg .  Repeat testing in: 2 weeks  Anticoagulation Visit Questionnaire Coumadin dose missed/changed:  No Abnormal Bleeding Symptoms:  No  Any diet changes including alcohol intake, vegetables or greens since the last visit:  No Any illnesses or hospitalizations since the last visit:  No Any signs of clotting since the last visit (including chest discomfort, dizziness, shortness of breath, arm tingling, slurred speech, swelling or redness in leg):  No  MEDICATIONS METFORMIN HCL 500 MG TABS (METFORMIN HCL) two times a day SYNTHROID 100 MCG TABS (LEVOTHYROXINE SODIUM) once daily WARFARIN SODIUM 10 MG TABS (WARFARIN SODIUM) once daily VIVELLE-DOT 0.025 MG/24HR  PTTW (ESTRADIOL) change patch weekly MULTIVITAMINS   TABS (MULTIPLE VITAMIN) once daily CALTRATE 600+D PLUS 600-400 MG-UNIT  CHEW (CALCIUM CARBONATE-VIT D-MIN) two times a day VITAMIN D 1000 UNIT  TABS (CHOLECALCIFEROL) once daily VENTOLIN HFA 108 (90 BASE) MCG/ACT  AERS (ALBUTEROL SULFATE) 2 puffs every 4 hours as needed shortness of breath HUMIRA 20 MG/0.4ML KIT (ADALIMUMAB) inject weekly PREDNISONE 10 MG TABS (PREDNISONE) as directed SOMA 250 MG  TABS (CARISOPRODOL) 1 q 6 hours as needed spasm

## 2010-11-22 NOTE — Letter (Signed)
Summary: [facility]   Imported By: Sherian Rein 04/08/2010 10:06:25  _____________________________________________________________________  External Attachment:    Type:   Image     Comment:   External Document

## 2010-11-22 NOTE — Progress Notes (Signed)
Summary: question about eye sx  Phone Note Call from Patient Call back at Work Phone [phone]   Caller: Patient Call For: Nelwyn Salisbury MD Summary of Call: having epidural injections on Friday- off Coumadin. Had episode yesterday which was new- at work and vision got like she was underwater and saw "silver bric-a-bracs"; last 5 min, then went away. No other signs or headache. ok today Initial call taken by: Raechel Ache, RN,  February 02, 2010 1:17 PM  Follow-up for Phone Call        this does not sound serious. If it happens again,or  if she loses vision, or if the vision gets blurry let us know Follow-up by: Nelwyn Salisbury MD,  February 02, 2010 4:35 PM  Additional Follow-up for Phone Call Additional follow up Details #1::        Phone Call Completed Additional Follow-up by: Raechel Ache, RN,  February 02, 2010 4:53 PM

## 2010-11-22 NOTE — Assessment & Plan Note (Signed)
Summary: abx did not work/cough/njr   Vital Signs:  Patient profile:   60 year old female Weight:      162 pounds O2 Sat:      95 % Temp:     98.7 degrees F Pulse rate:   100 / minute BP sitting:   140 / 90  (left arm) Cuff size:   regular  Vitals Entered By: Pura Spice, RN (June 21, 2010 3:16 PM) CC: completed z pack today and is on decreasing prednisone.  still c/o cough congestion.   History of Present Illness: Here for 6 days of PND, chest congestion, SOB, and coughing up green sputum. No fever. She just finshed a 5 day Zpack, but this did not help much. She is on Prednisone taper, as I have instructed her to do in the past. Currently she is taking 40 mg a day. Using her inhaler several times a day.  Allergies (verified): No Known Drug Allergies  Past History:  Past Medical History: Reviewed history from 02/10/2009 and no changes required. GERD COPD Rheumatoid arthritis, sees Dr. Jimmy Footman Pulmonary embolism, hx of Anticoagulation therapy Colonic polyps, hx of Hypothyroidism discoid lupus, sees Dr. Terri Piedra  Review of Systems  The patient denies anorexia, fever, weight loss, weight gain, vision loss, decreased hearing, hoarseness, chest pain, syncope, dyspnea on exertion, peripheral edema, headaches, hemoptysis, abdominal pain, melena, hematochezia, severe indigestion/heartburn, hematuria, incontinence, genital sores, muscle weakness, suspicious skin lesions, transient blindness, difficulty walking, depression, unusual weight change, abnormal bleeding, enlarged lymph nodes, angioedema, breast masses, and testicular masses.    Physical Exam  General:  Well-developed,well-nourished,in no acute distress; alert,appropriate and cooperative throughout examination Head:  Normocephalic and atraumatic without obvious abnormalities. No apparent alopecia or balding. Eyes:  No corneal or conjunctival inflammation noted. EOMI. Perrla. Funduscopic exam benign, without  hemorrhages, exudates or papilledema. Vision grossly normal. Ears:  External ear exam shows no significant lesions or deformities.  Otoscopic examination reveals clear canals, tympanic membranes are intact bilaterally without bulging, retraction, inflammation or discharge. Hearing is grossly normal bilaterally. Nose:  External nasal examination shows no deformity or inflammation. Nasal mucosa are pink and moist without lesions or exudates. Mouth:  Oral mucosa and oropharynx without lesions or exudates.  Teeth in good repair. Neck:  No deformities, masses, or tenderness noted. Lungs:  scattered wheezes and rhonchi, no rales   Impression & Recommendations:  Problem # 1:  BRONCHITIS, ACUTE (ICD-466.0)  Her updated medication list for this problem includes:    Ventolin Hfa 108 (90 Base) Mcg/act Aers (Albuterol sulfate) .Marland Kitchen... 2 puffs every 4 hours as needed shortness of breath    Levaquin 500 Mg Tabs (Levofloxacin) ..... Once daily  Orders: Nebulizer Tx (73710)  Problem # 2:  COPD (ICD-496)  Her updated medication list for this problem includes:    Ventolin Hfa 108 (90 Base) Mcg/act Aers (Albuterol sulfate) .Marland Kitchen... 2 puffs every 4 hours as needed shortness of breath  Orders: Nebulizer Tx ([id number])  Complete Medication List: 1)  Metformin Hcl 500 Mg Tabs (Metformin hcl) .... Two times a day 2)  Synthroid 100 Mcg Tabs (Levothyroxine sodium) .... Once daily 3)  Warfarin Sodium 10 Mg Tabs (Warfarin sodium) .... Once daily 4)  Vivelle-dot 0.025 Mg/24hr Pttw (Estradiol) .... Change patch weekly 5)  Multivitamins Tabs (Multiple vitamin) .... Once daily 6)  Caltrate 600+d Plus 600-400 Mg-unit Chew (Calcium carbonate-vit d-min) .... Two times a day 7)  Vitamin D 1000 Unit Tabs (Cholecalciferol) .... Once daily 8)  Ventolin Hfa 108 (  90 Base) Mcg/act Aers (Albuterol sulfate) .... 2 puffs every 4 hours as needed shortness of breath 9)  Humira 20 Mg/0.71ml Kit (Adalimumab) .... Inject weekly 10)   Prednisone 10 Mg Tabs (Prednisone) .... As directed 11)  Soma 250 Mg Tabs (Carisoprodol) .Marland Kitchen.. 1 q 6 hours as needed spasm 12)  Vicodin 5-500 Mg Tabs (Hydrocodone-acetaminophen) .Marland Kitchen.. 1 q6h as needed 13)  Mexotrate 0.6 Cc Inject Weekly  14)  Levaquin 500 Mg Tabs (Levofloxacin) .... Once daily  Patient Instructions: 1)  Please schedule a follow-up appointment as needed .  Prescriptions: LEVAQUIN 500 MG TABS (LEVOFLOXACIN) once daily  #10 x 0   Entered and Authorized by:   Nelwyn Salisbury MD   Signed by:   Nelwyn Salisbury MD on 06/21/2010   Method used:   Electronically to        [id number] (retail)       [address]       [place], [place]  [address]       Ph: [phone] or [phone]       Fax: [phone]   RxID:   [id number] VENTOLIN HFA 108 (90 BASE) MCG/ACT  AERS (ALBUTEROL SULFATE) 2 puffs every 4 hours as needed shortness of breath  #1 x 11   Entered and Authorized by:   Nelwyn Salisbury MD   Signed by:   Nelwyn Salisbury MD on 06/21/2010   Method used:   Electronically to        [facility] (retail)       [address]       [place], [place]  [address]       Ph: [phone] or [phone]       Fax: [phone]   RxID:   [id number]

## 2010-11-22 NOTE — Assessment & Plan Note (Signed)
Summary: pt/njr  Nurse Visit   Allergies: No Known Drug Allergies Laboratory Results   Blood Tests   Date/Time Received: June 03, 2010 12:15 PM  Date/Time Reported: June 03, 2010 12:15 PM    INR: 2.5   (Normal Range: 0.88-1.12   Therap INR: 2.0-3.5) Comments: Wynona Canes, CMA  June 03, 2010 12:15 PM     Orders Added: 1)  Est. Patient Level I [99211] 2)  Protime [62130QM]  Laboratory Results   Blood Tests      INR: 2.5   (Normal Range: 0.88-1.12   Therap INR: 2.0-3.5) Comments: Wynona Canes, CMA  June 03, 2010 12:15 PM       ANTICOAGULATION RECORD PREVIOUS REGIMEN & LAB RESULTS Anticoagulation Diagnosis:  v58.83,v58.61,453.41 on  05/31/2007 Previous INR Goal Range:  2.5-3.5 on  03/20/2008 Previous INR:  3.9 on  04/27/2010 Previous Coumadin Dose(mg):  15mg  on mon & fri 10mg  on other days on  04/27/2010 Previous Regimen:  same on  03/02/2010 Previous Coagulation Comments:  Hold for 1 Day on  04/27/2010  NEW REGIMEN & LAB RESULTS Current INR: 2.5 Regimen: same  (no change)       Repeat testing in: 4 weeks MEDICATIONS METFORMIN HCL 500 MG TABS (METFORMIN HCL) two times a day SYNTHROID 100 MCG TABS (LEVOTHYROXINE SODIUM) once daily WARFARIN SODIUM 10 MG TABS (WARFARIN SODIUM) once daily VIVELLE-DOT 0.025 MG/24HR  PTTW (ESTRADIOL) change patch weekly MULTIVITAMINS   TABS (MULTIPLE VITAMIN) once daily CALTRATE 600+D PLUS 600-400 MG-UNIT  CHEW (CALCIUM CARBONATE-VIT D-MIN) two times a day VITAMIN D 1000 UNIT  TABS (CHOLECALCIFEROL) once daily VENTOLIN HFA 108 (90 BASE) MCG/ACT  AERS (ALBUTEROL SULFATE) 2 puffs every 4 hours as needed shortness of breath HUMIRA 20 MG/0.4ML KIT (ADALIMUMAB) inject weekly PREDNISONE 10 MG TABS (PREDNISONE) as directed SOMA 250 MG TABS (CARISOPRODOL) 1 q 6 hours as needed spasm VICODIN 5-500 MG TABS (HYDROCODONE-ACETAMINOPHEN) 1 q6h as needed   Anticoagulation Visit Questionnaire      Coumadin dose  missed/changed:  No      Abnormal Bleeding Symptoms:  No   Any diet changes including alcohol intake, vegetables or greens since the last visit:  No Any illnesses or hospitalizations since the last visit:  No Any signs of clotting since the last visit (including chest discomfort, dizziness, shortness of breath, arm tingling, slurred speech, swelling or redness in leg):  No

## 2010-11-22 NOTE — Letter (Signed)
Summary: [facility]   Imported By: Maryln Gottron 01/20/2010 09:32:09  _____________________________________________________________________  External Attachment:    Type:   Image     Comment:   External Document

## 2010-11-22 NOTE — Letter (Signed)
Summary: [facility]   Imported By: Maryln Gottron 03/23/2010 15:07:19  _____________________________________________________________________  External Attachment:    Type:   Image     Comment:   External Document

## 2010-11-22 NOTE — Progress Notes (Signed)
Summary: question  Phone Note Call from Patient Call back at [phone]   Caller: Patient Call For: Nelwyn Salisbury MD Summary of Call: stopping coumadin for breast surgery 6/15 for 5 days. Is scheduled for steroid inj June 6 and they want her to stop coumadin also; she thinks this is too much in 1 month - do you agree?  Follow-up for Phone Call        No I think this should be okay to stop it twice Follow-up by: Nelwyn Salisbury MD,  March 24, 2010 12:26 PM  Additional Follow-up for Phone Call Additional follow up Details #1::        Phone Call Completed Additional Follow-up by: Raechel Ache, RN,  March 24, 2010 12:33 PM

## 2010-11-22 NOTE — Assessment & Plan Note (Signed)
Summary: PT/NJR/pt rescd//ccm  Nurse Visit   Allergies: No Known Drug Allergies Laboratory Results   Blood Tests      INR: 3.2   (Normal Range: 0.88-1.12   Therap INR: 2.0-3.5) Comments: Rita Ohara  July 08, 2010 1:49 PM     Orders Added: 1)  Est. Patient Level I [99211] 2)  Protime [[address]UE]   ANTICOAGULATION RECORD PREVIOUS REGIMEN & LAB RESULTS Anticoagulation Diagnosis:  v58.83,v58.61,453.41 on  05/31/2007 Previous INR Goal Range:  2.5-3.5 on  03/20/2008 Previous INR:  2.5 on  06/03/2010 Previous Coumadin Dose(mg):  15mg  on mon & fri 10mg  on other days on  04/27/2010 Previous Regimen:  same on  03/02/2010 Previous Coagulation Comments:  Hold for 1 Day on  04/27/2010  NEW REGIMEN & LAB RESULTS Current INR: 3.2 Regimen: same  Repeat testing in: 4 weeks  Anticoagulation Visit Questionnaire Coumadin dose missed/changed:  No Abnormal Bleeding Symptoms:  No  Any diet changes including alcohol intake, vegetables or greens since the last visit:  No Any illnesses or hospitalizations since the last visit:  No Any signs of clotting since the last visit (including chest discomfort, dizziness, shortness of breath, arm tingling, slurred speech, swelling or redness in leg):  No  MEDICATIONS METFORMIN HCL 500 MG TABS (METFORMIN HCL) two times a day SYNTHROID 100 MCG TABS (LEVOTHYROXINE SODIUM) once daily WARFARIN SODIUM 10 MG TABS (WARFARIN SODIUM) once daily VIVELLE-DOT 0.025 MG/24HR  PTTW (ESTRADIOL) change patch weekly MULTIVITAMINS   TABS (MULTIPLE VITAMIN) once daily CALTRATE 600+D PLUS 600-400 MG-UNIT  CHEW (CALCIUM CARBONATE-VIT D-MIN) two times a day VITAMIN D 1000 UNIT  TABS (CHOLECALCIFEROL) once daily VENTOLIN HFA 108 (90 BASE) MCG/ACT  AERS (ALBUTEROL SULFATE) 2 puffs every 4 hours as needed shortness of breath HUMIRA 20 MG/0.4ML KIT (ADALIMUMAB) inject weekly PREDNISONE 10 MG TABS (PREDNISONE) as directed SOMA 250 MG TABS (CARISOPRODOL) 1 q 6 hours  as needed spasm VICODIN 5-500 MG TABS (HYDROCODONE-ACETAMINOPHEN) 1 q6h as needed * MEXOTRATE 0.6 CC  INJECT WEEKLY

## 2010-11-24 NOTE — Assessment & Plan Note (Signed)
Summary: head/chest congestion/njr   Vital Signs:  Patient profile:   60 year old female Weight:      171 pounds BMI:     31.39 O2 Sat:      91 % Temp:     99 degrees F Pulse rate:   90 / minute BP sitting:   164 / 80  (left arm)  Vitals Entered By: Pura Spice, RN (November 18, 2010 2:21 PM) CC: cough congestion    History of Present Illness: Here for one week of stuffy head, ST, chest congestion, wheezing, and coughing up yellow sputum. No fever. Using Ventolin and taking a Prednisone taper as she has been instructed.   Allergies (verified): No Known Drug Allergies  Past History:  Past Medical History: Reviewed history from 02/10/2009 and no changes required. GERD COPD Rheumatoid arthritis, sees Dr. Jimmy Footman Pulmonary embolism, hx of Anticoagulation therapy Colonic polyps, hx of Hypothyroidism discoid lupus, sees Dr. Terri Piedra  Review of Systems  The patient denies anorexia, fever, weight loss, weight gain, vision loss, decreased hearing, hoarseness, chest pain, syncope, peripheral edema, headaches, hemoptysis, abdominal pain, melena, hematochezia, severe indigestion/heartburn, hematuria, incontinence, genital sores, muscle weakness, suspicious skin lesions, transient blindness, difficulty walking, depression, unusual weight change, abnormal bleeding, enlarged lymph nodes, angioedema, breast masses, and testicular masses.    Physical Exam  General:  Well-developed,well-nourished,in no acute distress; alert,appropriate and cooperative throughout examination Head:  Normocephalic and atraumatic without obvious abnormalities. No apparent alopecia or balding. Eyes:  No corneal or conjunctival inflammation noted. EOMI. Perrla. Funduscopic exam benign, without hemorrhages, exudates or papilledema. Vision grossly normal. Ears:  External ear exam shows no significant lesions or deformities.  Otoscopic examination reveals clear canals, tympanic membranes are intact bilaterally  without bulging, retraction, inflammation or discharge. Hearing is grossly normal bilaterally. Nose:  External nasal examination shows no deformity or inflammation. Nasal mucosa are pink and moist without lesions or exudates. Mouth:  Oral mucosa and oropharynx without lesions or exudates.  Teeth in good repair. Neck:  No deformities, masses, or tenderness noted. Lungs:  scattered wheezes and rhonchi, no rales Heart:  Normal rate and regular rhythm. S1 and S2 normal without gallop, murmur, click, rub or other extra sounds.   Impression & Recommendations:  Problem # 1:  BRONCHITIS, ACUTE (ICD-466.0)  The following medications were removed from the medication list:    Levaquin 500 Mg Tabs (Levofloxacin) .Marland Kitchen... 1 by mouth once daily for 10 days Her updated medication list for this problem includes:    Ventolin Hfa 108 (90 Base) Mcg/act Aers (Albuterol sulfate) .Marland Kitchen... 2 puffs every 4 hours as needed shortness of breath    Levaquin 500 Mg Tabs (Levofloxacin) ..... Once daily  Orders: Nebulizer Tx ([id number])  Problem # 2:  EMPHYSEMA (ICD-492.8)  Orders: Nebulizer Tx ([id number])  Complete Medication List: 1)  Metformin Hcl 500 Mg Tabs (Metformin hcl) .... Two times a day 2)  Synthroid 100 Mcg Tabs (Levothyroxine sodium) .... Once daily 3)  Warfarin Sodium 10 Mg Tabs (Warfarin sodium) .... Once daily 4)  Vivelle-dot 0.025 Mg/24hr Pttw (Estradiol) .... Change patch weekly 5)  Multivitamins Tabs (Multiple vitamin) .... Once daily 6)  Caltrate 600+d Plus 600-400 Mg-unit Chew (Calcium carbonate-vit d-min) .... Two times a day 7)  Vitamin D 1000 Unit Tabs (Cholecalciferol) .... Once daily 8)  Ventolin Hfa 108 (90 Base) Mcg/act Aers (Albuterol sulfate) .... 2 puffs every 4 hours as needed shortness of breath 9)  Humira 20 Mg/0.9ml Kit (Adalimumab) .... Inject weekly 10)  Prednisone 10 Mg Tabs (Prednisone) .... As directed 11)  Vicodin 5-500 Mg Tabs (Hydrocodone-acetaminophen) .Marland Kitchen.. 1 q6h as needed 12)   Mexotrate 0.6 Cc Inject Weekly  13)  Levaquin 500 Mg Tabs (Levofloxacin) .... Once daily  Patient Instructions: 1)  Please schedule a follow-up appointment as needed .  Prescriptions: LEVAQUIN 500 MG TABS (LEVOFLOXACIN) once daily  #10 x 0   Entered and Authorized by:   Nelwyn Salisbury MD   Signed by:   Nelwyn Salisbury MD on 11/18/2010   Method used:   Electronically to        [facility]       [address]       [place], [place]  [address]       Ph: [phone] or [phone]       Fax: [phone]   RxID:   [id number]    Orders Added: 1)  Est. Patient Level IV [64403] 2)  Nebulizer Tx [id number]

## 2010-11-24 NOTE — Assessment & Plan Note (Signed)
Summary: pt//ccm  Nurse Visit   Allergies: No Known Drug Allergies Laboratory Results   Blood Tests      INR: 2.7   (Normal Range: 0.88-1.12   Therap INR: 2.0-3.5)    Orders Added: 1)  Est. Patient Level I [98119] 2)  Protime [14782NF]   ANTICOAGULATION RECORD PREVIOUS REGIMEN & LAB RESULTS Anticoagulation Diagnosis:  v58.83,v58.61,453.41 on  05/31/2007 Previous INR Goal Range:  2.5-3.5 on  03/20/2008 Previous INR:  2.9 on  09/09/2010 Previous Coumadin Dose(mg):  15mg  on mon & fri 10mg  on other days on  04/27/2010 Previous Regimen:  same on  08/08/2010 Previous Coagulation Comments:  Hold for 1 Day on  04/27/2010  NEW REGIMEN & LAB RESULTS Current INR: 2.7 Regimen: same  Repeat testing in: 4 weeks  Anticoagulation Visit Questionnaire Coumadin dose missed/changed:  No Abnormal Bleeding Symptoms:  No  Any diet changes including alcohol intake, vegetables or greens since the last visit:  No Any illnesses or hospitalizations since the last visit:  No Any signs of clotting since the last visit (including chest discomfort, dizziness, shortness of breath, arm tingling, slurred speech, swelling or redness in leg):  No  MEDICATIONS METFORMIN HCL 500 MG TABS (METFORMIN HCL) two times a day SYNTHROID 100 MCG TABS (LEVOTHYROXINE SODIUM) once daily WARFARIN SODIUM 10 MG TABS (WARFARIN SODIUM) once daily VIVELLE-DOT 0.025 MG/24HR  PTTW (ESTRADIOL) change patch weekly MULTIVITAMINS   TABS (MULTIPLE VITAMIN) once daily CALTRATE 600+D PLUS 600-400 MG-UNIT  CHEW (CALCIUM CARBONATE-VIT D-MIN) two times a day VITAMIN D 1000 UNIT  TABS (CHOLECALCIFEROL) once daily VENTOLIN HFA 108 (90 BASE) MCG/ACT  AERS (ALBUTEROL SULFATE) 2 puffs every 4 hours as needed shortness of breath HUMIRA 20 MG/0.4ML KIT (ADALIMUMAB) inject weekly PREDNISONE 10 MG TABS (PREDNISONE) as directed SOMA 250 MG TABS (CARISOPRODOL) 1 q 6 hours as needed spasm VICODIN 5-500 MG TABS (HYDROCODONE-ACETAMINOPHEN) 1  q6h as needed * MEXOTRATE 0.6 CC  INJECT WEEKLY  LEVAQUIN 500 MG TABS (LEVOFLOXACIN) 1 by mouth once daily for 10 days

## 2010-11-24 NOTE — Assessment & Plan Note (Signed)
Summary: pt//ccm  Nurse Visit   Allergies: No Known Drug Allergies Laboratory Results   Blood Tests      INR: 3.1   (Normal Range: 0.88-1.12   Therap INR: 2.0-3.5) Comments: Rita Ohara  November 04, 2010 2:11 PM     Orders Added: 1)  Est. Patient Level I [99211] 2)  Protime [[address]UE]   ANTICOAGULATION RECORD PREVIOUS REGIMEN & LAB RESULTS Anticoagulation Diagnosis:  v58.83,v58.61,453.41 on  05/31/2007 Previous INR Goal Range:  2.5-3.5 on  03/20/2008 Previous INR:  2.7 on  10/07/2010 Previous Coumadin Dose(mg):  15mg  on mon & fri 10mg  on other days on  04/27/2010 Previous Regimen:  same on  10/07/2010 Previous Coagulation Comments:  Hold for 1 Day on  04/27/2010  NEW REGIMEN & LAB RESULTS Current INR: 3.1 Regimen: same  Repeat testing in: 4 weeks  Anticoagulation Visit Questionnaire Coumadin dose missed/changed:  No Abnormal Bleeding Symptoms:  No  Any diet changes including alcohol intake, vegetables or greens since the last visit:  No Any illnesses or hospitalizations since the last visit:  No Any signs of clotting since the last visit (including chest discomfort, dizziness, shortness of breath, arm tingling, slurred speech, swelling or redness in leg):  No  MEDICATIONS METFORMIN HCL 500 MG TABS (METFORMIN HCL) two times a day SYNTHROID 100 MCG TABS (LEVOTHYROXINE SODIUM) once daily WARFARIN SODIUM 10 MG TABS (WARFARIN SODIUM) once daily VIVELLE-DOT 0.025 MG/24HR  PTTW (ESTRADIOL) change patch weekly MULTIVITAMINS   TABS (MULTIPLE VITAMIN) once daily CALTRATE 600+D PLUS 600-400 MG-UNIT  CHEW (CALCIUM CARBONATE-VIT D-MIN) two times a day VITAMIN D 1000 UNIT  TABS (CHOLECALCIFEROL) once daily VENTOLIN HFA 108 (90 BASE) MCG/ACT  AERS (ALBUTEROL SULFATE) 2 puffs every 4 hours as needed shortness of breath HUMIRA 20 MG/0.4ML KIT (ADALIMUMAB) inject weekly PREDNISONE 10 MG TABS (PREDNISONE) as directed SOMA 250 MG TABS (CARISOPRODOL) 1 q 6 hours as needed  spasm VICODIN 5-500 MG TABS (HYDROCODONE-ACETAMINOPHEN) 1 q6h as needed * MEXOTRATE 0.6 CC  INJECT WEEKLY  LEVAQUIN 500 MG TABS (LEVOFLOXACIN) 1 by mouth once daily for 10 days

## 2010-11-29 ENCOUNTER — Other Ambulatory Visit: Payer: Self-pay | Admitting: Family Medicine

## 2010-11-29 DIAGNOSIS — E039 Hypothyroidism, unspecified: Secondary | ICD-10-CM

## 2010-12-02 ENCOUNTER — Other Ambulatory Visit: Payer: Self-pay

## 2010-12-02 ENCOUNTER — Ambulatory Visit (INDEPENDENT_AMBULATORY_CARE_PROVIDER_SITE_OTHER): Payer: 59 | Admitting: Family Medicine

## 2010-12-02 DIAGNOSIS — I824Y9 Acute embolism and thrombosis of unspecified deep veins of unspecified proximal lower extremity: Secondary | ICD-10-CM

## 2010-12-02 LAB — POCT INR: INR: 2.7

## 2010-12-08 ENCOUNTER — Other Ambulatory Visit: Payer: Self-pay

## 2010-12-08 DIAGNOSIS — R52 Pain, unspecified: Secondary | ICD-10-CM

## 2010-12-09 MED ORDER — HYDROCODONE-ACETAMINOPHEN 7.5-500 MG PO TABS: 1.0000 | ORAL_TABLET | Freq: Four times a day (QID) | ORAL | Status: DC | PRN

## 2010-12-09 NOTE — Telephone Encounter (Signed)
Call in Vicodin #[phone] with 5 rf

## 2011-01-02 ENCOUNTER — Other Ambulatory Visit (INDEPENDENT_AMBULATORY_CARE_PROVIDER_SITE_OTHER): Payer: 59 | Admitting: Family Medicine

## 2011-01-02 ENCOUNTER — Ambulatory Visit: Payer: 59 | Admitting: Family Medicine

## 2011-01-02 DIAGNOSIS — I824Y9 Acute embolism and thrombosis of unspecified deep veins of unspecified proximal lower extremity: Secondary | ICD-10-CM

## 2011-01-08 LAB — CBC
HCT: 45.2 % (ref 36.0–46.0)
MCHC: 34.1 g/dL (ref 30.0–36.0)
Platelets: 219 10*3/uL (ref 150–400)
RDW: 14.5 % (ref 11.5–15.5)

## 2011-01-08 LAB — BASIC METABOLIC PANEL
BUN: 8 mg/dL (ref 6–23)
Calcium: 9.4 mg/dL (ref 8.4–10.5)
GFR calc non Af Amer: >60 mL/min (ref >60)
Glucose, Bld: 117 mg/dL — ABNORMAL HIGH (ref 70–99)

## 2011-01-08 LAB — DIFFERENTIAL
Basophils Absolute: 0 10*3/uL (ref 0.0–0.1)
Basophils Relative: 0 % (ref 0–1)
Eosinophils Relative: 3 % (ref 0–5)
Lymphocytes Relative: 45 % (ref 12–46)
Neutro Abs: 4 10*3/uL (ref 1.7–7.7)

## 2011-01-08 LAB — PROTIME-INR: Prothrombin Time: 14.6 seconds (ref 11.6–15.2)

## 2011-01-08 LAB — GLUCOSE, CAPILLARY: Glucose-Capillary: 104 mg/dL — ABNORMAL HIGH (ref 70–99)

## 2011-01-12 ENCOUNTER — Telehealth: Payer: Self-pay | Admitting: *Deleted

## 2011-01-12 NOTE — Telephone Encounter (Signed)
Wants to talk to Almira Coaster about letter of medical condition for Social Security Disability.

## 2011-01-13 NOTE — Telephone Encounter (Signed)
Per Dr Clent Ridges she needs office visit

## 2011-01-13 NOTE — Telephone Encounter (Signed)
LMTCB for appt.

## 2011-01-24 ENCOUNTER — Other Ambulatory Visit: Payer: Self-pay

## 2011-01-25 MED ORDER — HYDROCODONE-ACETAMINOPHEN 5-500 MG PO TABS: 1.0000 | ORAL_TABLET | Freq: Four times a day (QID) | ORAL | Status: DC | PRN

## 2011-01-25 NOTE — Telephone Encounter (Signed)
rx called in for hydrocodone with 5 refills

## 2011-01-25 NOTE — Telephone Encounter (Signed)
Call in #[phone] with 5 rf 

## 2011-02-02 ENCOUNTER — Ambulatory Visit: Payer: 59

## 2011-02-02 DIAGNOSIS — I824Y9 Acute embolism and thrombosis of unspecified deep veins of unspecified proximal lower extremity: Secondary | ICD-10-CM

## 2011-02-02 DIAGNOSIS — Z7901 Long term (current) use of anticoagulants: Secondary | ICD-10-CM

## 2011-02-02 NOTE — Patient Instructions (Signed)
Same dose 

## 2011-03-03 ENCOUNTER — Ambulatory Visit: Payer: 59

## 2011-03-10 ENCOUNTER — Ambulatory Visit: Payer: Self-pay | Admitting: Family Medicine

## 2011-03-10 DIAGNOSIS — I824Y9 Acute embolism and thrombosis of unspecified deep veins of unspecified proximal lower extremity: Secondary | ICD-10-CM

## 2011-03-10 NOTE — Assessment & Plan Note (Signed)
[facility]                                 ON-CALL NOTE   Jaime Kelley, Jaime Kelley                      MRN:          [id number]  DATE:11/25/2006                            DOB:          [birth year]    TIME OF CALL:  1:13 p.m.   PHONE NUMBER:  [phone]   OBJECTIVE:  The patient is having abdominal pain.  Has been seeing Dr.  Clent Ridges for 2 weeks.  Has pain in the abdomen under the ribcage on the right  side.  Ultrasound was done 1 week ago Friday and is to have a CT scan on  Wednesday.  She is now having fever, throwing up, and wondering whether  she has the flu or whether it is her gallbladder acting up.  She is  tearful on the phone and is in obvious distress.   ASSESSMENT:  Abdominal pain.   PLAN:  The patient was sent to the emergency room for evaluation.  Suggested that she go to [facility].     Arta Silence, MD  Electronically Signed    RNS/MedQ  DD: 11/25/2006  DT: 11/25/2006  Job #: [id number]

## 2011-03-10 NOTE — Patient Instructions (Signed)
Same dose 

## 2011-03-13 ENCOUNTER — Other Ambulatory Visit: Payer: Self-pay

## 2011-03-13 ENCOUNTER — Other Ambulatory Visit: Payer: Self-pay | Admitting: Family Medicine

## 2011-03-13 DIAGNOSIS — R921 Mammographic calcification found on diagnostic imaging of breast: Secondary | ICD-10-CM

## 2011-03-14 ENCOUNTER — Other Ambulatory Visit: Payer: Self-pay | Admitting: *Deleted

## 2011-03-14 ENCOUNTER — Ambulatory Visit (INDEPENDENT_AMBULATORY_CARE_PROVIDER_SITE_OTHER): Payer: BC Managed Care – PPO | Admitting: Family Medicine

## 2011-03-14 DIAGNOSIS — I824Y9 Acute embolism and thrombosis of unspecified deep veins of unspecified proximal lower extremity: Secondary | ICD-10-CM

## 2011-03-14 LAB — POCT INR: INR: 2.3

## 2011-03-14 MED ORDER — METFORMIN HCL 500 MG PO TABS: 500.0000 mg | ORAL_TABLET | Freq: Two times a day (BID) | ORAL | Status: DC

## 2011-03-14 NOTE — Patient Instructions (Signed)
Same dsse

## 2011-03-30 ENCOUNTER — Other Ambulatory Visit: Payer: Self-pay | Admitting: Obstetrics and Gynecology

## 2011-03-30 DIAGNOSIS — Z780 Asymptomatic menopausal state: Secondary | ICD-10-CM

## 2011-04-07 ENCOUNTER — Ambulatory Visit: Payer: BC Managed Care – PPO

## 2011-04-07 DIAGNOSIS — I824Y9 Acute embolism and thrombosis of unspecified deep veins of unspecified proximal lower extremity: Secondary | ICD-10-CM

## 2011-04-17 ENCOUNTER — Ambulatory Visit
Admission: RE | Admit: 2011-04-17 | Discharge: 2011-04-17 | Disposition: A | Discharge status: Home / Self Care | Payer: BC Managed Care – PPO | Source: Ambulatory Visit | Attending: Family Medicine | Admitting: Family Medicine

## 2011-04-17 DIAGNOSIS — R921 Mammographic calcification found on diagnostic imaging of breast: Secondary | ICD-10-CM

## 2011-04-24 ENCOUNTER — Ambulatory Visit
Admission: RE | Admit: 2011-04-24 | Discharge: 2011-04-24 | Disposition: A | Discharge status: Home / Self Care | Payer: BC Managed Care – PPO | Source: Ambulatory Visit | Attending: Obstetrics and Gynecology | Admitting: Obstetrics and Gynecology

## 2011-04-24 DIAGNOSIS — Z780 Asymptomatic menopausal state: Secondary | ICD-10-CM

## 2011-05-05 ENCOUNTER — Ambulatory Visit (INDEPENDENT_AMBULATORY_CARE_PROVIDER_SITE_OTHER): Payer: BC Managed Care – PPO

## 2011-05-05 DIAGNOSIS — I824Y9 Acute embolism and thrombosis of unspecified deep veins of unspecified proximal lower extremity: Secondary | ICD-10-CM

## 2011-05-05 LAB — POCT INR: INR: 3

## 2011-05-05 NOTE — Patient Instructions (Signed)
Same dose 

## 2011-05-11 ENCOUNTER — Telehealth: Payer: Self-pay

## 2011-05-11 NOTE — Telephone Encounter (Signed)
Pt had TSH done on Monday at rheumatology and it was abnormal. She was told that she needs to be on a higher dosage of synthroid and wants to know if this going to be done soon. [facility]

## 2011-05-11 NOTE — Telephone Encounter (Signed)
She would need an OV for this. I haven't seen her in awhile.

## 2011-05-11 NOTE — Telephone Encounter (Signed)
Pt notified. Appointment scheduled.

## 2011-05-12 ENCOUNTER — Encounter: Payer: Self-pay | Admitting: Family Medicine

## 2011-05-12 ENCOUNTER — Ambulatory Visit (INDEPENDENT_AMBULATORY_CARE_PROVIDER_SITE_OTHER): Payer: BC Managed Care – PPO | Admitting: Family Medicine

## 2011-05-12 VITALS — BP 130/90 | HR 82 | Temp 98.8°F | Wt 196.0 lb

## 2011-05-12 DIAGNOSIS — E039 Hypothyroidism, unspecified: Secondary | ICD-10-CM

## 2011-05-12 DIAGNOSIS — M1990 Unspecified osteoarthritis, unspecified site: Secondary | ICD-10-CM

## 2011-05-12 DIAGNOSIS — F3289 Other specified depressive episodes: Secondary | ICD-10-CM

## 2011-05-12 DIAGNOSIS — F329 Major depressive disorder, single episode, unspecified: Secondary | ICD-10-CM

## 2011-05-12 DIAGNOSIS — M129 Arthropathy, unspecified: Secondary | ICD-10-CM

## 2011-05-12 MED ORDER — LEVOTHYROXINE SODIUM 137 MCG PO TABS: 137.0000 ug | ORAL_TABLET | Freq: Every day | ORAL | Status: DC

## 2011-05-12 NOTE — Progress Notes (Signed)
  Subjective:    Patient ID: Jaime Kelley, female    DOB: 05-Feb-1951, 60 y.o.   MRN: [id number]  HPI Here to discuss her thyroid status and for med refills. She had labs done at Dr. Shawnee Knapp office on 05-08-11, and her TSH had jumped up to 9.610. She has felt tired lately and has put on some weight in the past 6 months.    Review of Systems  Constitutional: Positive for fatigue and unexpected weight change. Negative for activity change.  Respiratory: Negative.   Cardiovascular: Negative.   Musculoskeletal: Positive for arthralgias.       Objective:   Physical Exam  Constitutional: She appears well-developed and well-nourished.  Neck: No thyromegaly present.  Cardiovascular: Normal rate, regular rhythm, normal heart sounds and intact distal pulses.   Pulmonary/Chest: Effort normal and breath sounds normal.  Lymphadenopathy:    She has no cervical adenopathy.          Assessment & Plan:  Increase Synthroid to 137 mcg a day. Recheck a TSH in 60 days. meds were refilled

## 2011-05-22 ENCOUNTER — Ambulatory Visit: Payer: BC Managed Care – PPO

## 2011-05-22 DIAGNOSIS — I824Y9 Acute embolism and thrombosis of unspecified deep veins of unspecified proximal lower extremity: Secondary | ICD-10-CM

## 2011-05-22 LAB — POCT INR: INR: 1.9

## 2011-06-02 ENCOUNTER — Ambulatory Visit: Payer: BC Managed Care – PPO

## 2011-06-02 DIAGNOSIS — I824Y9 Acute embolism and thrombosis of unspecified deep veins of unspecified proximal lower extremity: Secondary | ICD-10-CM

## 2011-06-02 LAB — POCT INR: INR: 3.1

## 2011-06-02 NOTE — Patient Instructions (Signed)
Same dose, 15 mg on mondays and fridays 10 mg on other days,check in 4 weeks

## 2011-06-19 ENCOUNTER — Other Ambulatory Visit: Payer: Self-pay | Admitting: Family Medicine

## 2011-06-20 ENCOUNTER — Telehealth: Payer: Self-pay | Admitting: Family Medicine

## 2011-06-20 NOTE — Telephone Encounter (Signed)
Call in #[phone] with 5 rf 

## 2011-06-20 NOTE — Telephone Encounter (Signed)
Refill request for Hydrocodon/Acetaminophen 5-500 mg take 1 po q6hr prn. Pt last here on 05/12/11 and script last filled on 05/29/11.

## 2011-06-21 MED ORDER — HYDROCODONE-ACETAMINOPHEN 5-500 MG PO TABS: 1.0000 | ORAL_TABLET | Freq: Four times a day (QID) | ORAL | Status: DC | PRN

## 2011-06-21 NOTE — Telephone Encounter (Signed)
Script sent e-scribe 

## 2011-06-21 NOTE — Telephone Encounter (Signed)
Refill Warfarin 10 mg----[facility] [address]. Thanks.

## 2011-06-21 NOTE — Telephone Encounter (Signed)
Script called in

## 2011-06-30 ENCOUNTER — Ambulatory Visit (INDEPENDENT_AMBULATORY_CARE_PROVIDER_SITE_OTHER): Payer: BC Managed Care – PPO | Admitting: Family Medicine

## 2011-06-30 DIAGNOSIS — I824Y9 Acute embolism and thrombosis of unspecified deep veins of unspecified proximal lower extremity: Secondary | ICD-10-CM

## 2011-06-30 DIAGNOSIS — E039 Hypothyroidism, unspecified: Secondary | ICD-10-CM

## 2011-06-30 LAB — POCT INR: INR: 3.5

## 2011-06-30 LAB — TSH: TSH: 0.87 u[IU]/mL (ref 0.35–5.50)

## 2011-06-30 NOTE — Patient Instructions (Signed)
Same dose 

## 2011-07-03 ENCOUNTER — Telehealth: Payer: Self-pay | Admitting: *Deleted

## 2011-07-03 NOTE — Telephone Encounter (Signed)
Message copied by Baldemar Friday on Mon Jul 03, 2011  5:31 PM ------      Message from: Gershon Crane A      Created: Mon Jul 03, 2011  8:53 AM       Thyroid level is normal

## 2011-07-03 NOTE — Telephone Encounter (Signed)
Pt would like lab results.  

## 2011-07-03 NOTE — Telephone Encounter (Signed)
I spoke with pt and gave results.  

## 2011-07-12 ENCOUNTER — Ambulatory Visit (INDEPENDENT_AMBULATORY_CARE_PROVIDER_SITE_OTHER): Payer: BC Managed Care – PPO | Admitting: Family Medicine

## 2011-07-12 ENCOUNTER — Encounter: Payer: Self-pay | Admitting: Family Medicine

## 2011-07-12 VITALS — BP 122/84 | HR 80 | Temp 98.7°F | Wt 199.0 lb

## 2011-07-12 DIAGNOSIS — R5383 Other fatigue: Secondary | ICD-10-CM

## 2011-07-12 DIAGNOSIS — E039 Hypothyroidism, unspecified: Secondary | ICD-10-CM

## 2011-07-12 DIAGNOSIS — Z0000 Encounter for general adult medical examination without abnormal findings: Secondary | ICD-10-CM

## 2011-07-12 DIAGNOSIS — Z23 Encounter for immunization: Secondary | ICD-10-CM

## 2011-07-12 NOTE — Progress Notes (Signed)
Addended by: Aniceto Boss A on: 07/12/2011 10:45 AM   Modules accepted: Orders

## 2011-07-12 NOTE — Progress Notes (Signed)
  Subjective:    Patient ID: Jaime Kelley, female    DOB: 1951/04/08, 60 y.o.   MRN: [id number]  HPI Here to follow up on chronic fatigue. We had adjusted her Synthroid dose 2 months ago, and now she is at a therapeutic level, so we can't explain the fatigue with this. She has gained another 3 lbs. She finds it hard to exercise due to her chronic joint pains.    Review of Systems  Constitutional: Positive for fatigue.  Respiratory: Negative.   Cardiovascular: Negative.   Musculoskeletal: Positive for arthralgias.       Objective:   Physical Exam  Constitutional: She appears well-developed and well-nourished.  Psychiatric: She has a normal mood and affect. Her behavior is normal. Thought content normal.          Assessment & Plan:  Check a B12 level today. We discussed the possibility of depression causing her fatigue, but we decided to defer any treatment for this right now. She admits to some mild depression at times, but I do not think this explains the fatigue.

## 2011-07-14 ENCOUNTER — Telehealth: Payer: Self-pay | Admitting: *Deleted

## 2011-07-14 MED ORDER — BUPROPION HCL ER (XL) 150 MG PO TB24: 150.0000 mg | ORAL_TABLET | ORAL | Status: DC

## 2011-07-14 NOTE — Telephone Encounter (Signed)
Call in Wellbutrin XL 150 mg 1 po qd # 30 and 5 refills, okay per Dr. Clent Ridges to fill and pt aware.

## 2011-07-14 NOTE — Telephone Encounter (Signed)
Pt is calling for lab results 

## 2011-07-14 NOTE — Telephone Encounter (Signed)
Spoke with pt and gave results. 

## 2011-07-14 NOTE — Telephone Encounter (Signed)
See the results

## 2011-07-14 NOTE — Telephone Encounter (Signed)
Message copied by Baldemar Friday on Fri Jul 14, 2011  4:10 PM ------      Message from: Gershon Crane A      Created: Fri Jul 14, 2011 11:11 AM       Normal

## 2011-07-16 ENCOUNTER — Other Ambulatory Visit: Payer: Self-pay | Admitting: Family Medicine

## 2011-07-19 ENCOUNTER — Ambulatory Visit (INDEPENDENT_AMBULATORY_CARE_PROVIDER_SITE_OTHER): Payer: BC Managed Care – PPO | Admitting: Family Medicine

## 2011-07-19 DIAGNOSIS — Z2911 Encounter for prophylactic immunotherapy for respiratory syncytial virus (RSV): Secondary | ICD-10-CM

## 2011-07-19 DIAGNOSIS — Z0000 Encounter for general adult medical examination without abnormal findings: Secondary | ICD-10-CM

## 2011-07-28 ENCOUNTER — Ambulatory Visit: Payer: BC Managed Care – PPO

## 2011-08-04 ENCOUNTER — Ambulatory Visit (INDEPENDENT_AMBULATORY_CARE_PROVIDER_SITE_OTHER): Payer: BC Managed Care – PPO | Admitting: Family Medicine

## 2011-08-04 DIAGNOSIS — I824Y9 Acute embolism and thrombosis of unspecified deep veins of unspecified proximal lower extremity: Secondary | ICD-10-CM

## 2011-08-04 NOTE — Patient Instructions (Signed)
  Latest dosing instructions   Total Sun Mon Tue Wed Thu Fri Sat   90  5 mg    5 mg      (5 mg1)    (5 mg1)     Addl Tabs  10 mg 15 mg 10 mg 10 mg 10 mg 15 mg 10 mg    (10 mg1) (10 mg1.5) (10 mg1) (10 mg1) (10 mg1) (10 mg1.5) (10 mg1)    

## 2011-08-16 ENCOUNTER — Other Ambulatory Visit: Payer: Self-pay | Admitting: Family Medicine

## 2011-09-08 ENCOUNTER — Ambulatory Visit (INDEPENDENT_AMBULATORY_CARE_PROVIDER_SITE_OTHER): Payer: BC Managed Care – PPO | Admitting: Family Medicine

## 2011-09-08 DIAGNOSIS — I824Y9 Acute embolism and thrombosis of unspecified deep veins of unspecified proximal lower extremity: Secondary | ICD-10-CM

## 2011-09-08 NOTE — Patient Instructions (Signed)
  Latest dosing instructions   Total Sun Mon Tue Wed Thu Fri Sat   90  5 mg    5 mg      (5 mg1)    (5 mg1)     Addl Tabs  10 mg 15 mg 10 mg 10 mg 10 mg 15 mg 10 mg    (10 mg1) (10 mg1.5) (10 mg1) (10 mg1) (10 mg1) (10 mg1.5) (10 mg1)

## 2011-09-12 ENCOUNTER — Other Ambulatory Visit: Payer: Self-pay | Admitting: Internal Medicine

## 2011-09-12 DIAGNOSIS — M25561 Pain in right knee: Secondary | ICD-10-CM

## 2011-10-06 ENCOUNTER — Ambulatory Visit (INDEPENDENT_AMBULATORY_CARE_PROVIDER_SITE_OTHER): Payer: BC Managed Care – PPO | Admitting: Family Medicine

## 2011-10-06 DIAGNOSIS — I824Y9 Acute embolism and thrombosis of unspecified deep veins of unspecified proximal lower extremity: Secondary | ICD-10-CM

## 2011-10-06 LAB — POCT INR: INR: 3.2

## 2011-10-06 NOTE — Patient Instructions (Signed)
  Latest dosing instructions   Total Sun Mon Tue Wed Thu Fri Sat   90  5 mg    5 mg      (5 mg1)    (5 mg1)     Addl Tabs  10 mg 15 mg 10 mg 10 mg 10 mg 15 mg 10 mg    (10 mg1) (10 mg1.5) (10 mg1) (10 mg1) (10 mg1) (10 mg1.5) (10 mg1)    

## 2011-10-10 ENCOUNTER — Other Ambulatory Visit: Payer: Self-pay | Admitting: Family Medicine

## 2011-10-12 ENCOUNTER — Encounter: Payer: Self-pay | Admitting: Family Medicine

## 2011-10-12 ENCOUNTER — Ambulatory Visit (INDEPENDENT_AMBULATORY_CARE_PROVIDER_SITE_OTHER): Payer: BC Managed Care – PPO | Admitting: Family Medicine

## 2011-10-12 VITALS — BP 160/100 | HR 89 | Temp 98.7°F | Wt 194.0 lb

## 2011-10-12 DIAGNOSIS — F418 Other specified anxiety disorders: Secondary | ICD-10-CM

## 2011-10-12 DIAGNOSIS — L301 Dyshidrosis [pompholyx]: Secondary | ICD-10-CM

## 2011-10-12 DIAGNOSIS — F341 Dysthymic disorder: Secondary | ICD-10-CM

## 2011-10-12 MED ORDER — VENLAFAXINE HCL ER 75 MG PO CP24: 75.0000 mg | ORAL_CAPSULE | Freq: Every day | ORAL | Status: DC

## 2011-10-12 MED ORDER — TRIAMCINOLONE ACETONIDE 0.1 % EX CREA: TOPICAL_CREAM | Freq: Three times a day (TID) | CUTANEOUS | Status: DC

## 2011-10-12 NOTE — Progress Notes (Signed)
  Subjective:    Patient ID: Jaime Kelley, female    DOB: 04-Jan-1951, 60 y.o.   MRN: [id number]  HPI Here for several reasons. First she has been on Wellbutrin for 3 months but she does not think it is helping. She is still depressed and she thinks the med is causing HAs. Also she has an itchy rash on her hands.    Review of Systems  Constitutional: Negative.   Skin: Positive for rash.  Psychiatric/Behavioral: Positive for dysphoric mood. Negative for decreased concentration and agitation. The patient is nervous/anxious.        Objective:   Physical Exam  Constitutional: She appears well-developed and well-nourished.  Skin:       The palms of both hands are red with scaly patches   Psychiatric: She has a normal mood and affect. Her behavior is normal. Thought content normal.          Assessment & Plan:  Try Effexor XR for the anxiety and depression. Recheck in one month. Try Triamcinolone for the hands

## 2011-10-18 ENCOUNTER — Other Ambulatory Visit: Payer: Self-pay | Admitting: Family Medicine

## 2011-10-18 NOTE — Telephone Encounter (Signed)
Should pt be taking 10 mg of Coumadin?

## 2011-10-23 NOTE — Telephone Encounter (Signed)
She is 10mg  5 days a week and 15 mg 2 days a week. Requesting refills. She is out of her 10 mg. Thanks.

## 2011-10-23 NOTE — Telephone Encounter (Signed)
Pt need refill today.

## 2011-11-03 ENCOUNTER — Ambulatory Visit (INDEPENDENT_AMBULATORY_CARE_PROVIDER_SITE_OTHER): Payer: BC Managed Care – PPO | Admitting: Family Medicine

## 2011-11-03 ENCOUNTER — Telehealth: Payer: Self-pay | Admitting: *Deleted

## 2011-11-03 DIAGNOSIS — I824Y9 Acute embolism and thrombosis of unspecified deep veins of unspecified proximal lower extremity: Secondary | ICD-10-CM

## 2011-11-03 LAB — POCT INR: INR: 2.9

## 2011-11-03 MED ORDER — LEVOTHYROXINE SODIUM 137 MCG PO TABS: 137.0000 ug | ORAL_TABLET | Freq: Every day | ORAL | Status: DC

## 2011-11-03 NOTE — Patient Instructions (Signed)
  Latest dosing instructions   Total Sun Mon Tue Wed Thu Fri Sat   90  5 mg    5 mg      (5 mg1)    (5 mg1)     Addl Tabs  10 mg 15 mg 10 mg 10 mg 10 mg 15 mg 10 mg    (10 mg1) (10 mg1.5) (10 mg1) (10 mg1) (10 mg1) (10 mg1.5) (10 mg1)    

## 2011-11-03 NOTE — Telephone Encounter (Signed)
Refill on synthroid

## 2011-11-16 ENCOUNTER — Telehealth: Payer: Self-pay | Admitting: *Deleted

## 2011-11-16 MED ORDER — FLUOXETINE HCL 20 MG PO TABS: 20.0000 mg | ORAL_TABLET | Freq: Every day | ORAL | Status: DC

## 2011-11-16 NOTE — Telephone Encounter (Signed)
I called in new script, cancelled the Effexor and spoke with pt.

## 2011-11-16 NOTE — Telephone Encounter (Signed)
Cannot take Effexor as it gives her a rash with itching and wants to switch to a different med.

## 2011-11-16 NOTE — Telephone Encounter (Signed)
Stop Effexor and switch to Prozac 20 mg a day. Call in 90 day supply, and have her see me in 4-6 weeks

## 2011-12-01 ENCOUNTER — Ambulatory Visit: Payer: BC Managed Care – PPO

## 2011-12-11 ENCOUNTER — Ambulatory Visit (INDEPENDENT_AMBULATORY_CARE_PROVIDER_SITE_OTHER): Payer: BC Managed Care – PPO | Admitting: Family Medicine

## 2011-12-11 ENCOUNTER — Encounter: Payer: Self-pay | Admitting: Family Medicine

## 2011-12-11 VITALS — BP 138/96 | HR 92 | Temp 98.9°F | Wt 195.0 lb

## 2011-12-11 DIAGNOSIS — F418 Other specified anxiety disorders: Secondary | ICD-10-CM

## 2011-12-11 DIAGNOSIS — F341 Dysthymic disorder: Secondary | ICD-10-CM

## 2011-12-11 DIAGNOSIS — I824Y9 Acute embolism and thrombosis of unspecified deep veins of unspecified proximal lower extremity: Secondary | ICD-10-CM

## 2011-12-11 DIAGNOSIS — Z86718 Personal history of other venous thrombosis and embolism: Secondary | ICD-10-CM

## 2011-12-11 DIAGNOSIS — R21 Rash and other nonspecific skin eruption: Secondary | ICD-10-CM

## 2011-12-11 MED ORDER — FLUOXETINE HCL 40 MG PO CAPS: 40.0000 mg | ORAL_CAPSULE | Freq: Every day | ORAL | Status: DC

## 2011-12-11 MED ORDER — PREDNISONE 20 MG PO TABS: 20.0000 mg | ORAL_TABLET | ORAL | Status: DC | PRN

## 2011-12-11 NOTE — Progress Notes (Signed)
  Subjective:    Patient ID: Jaime Kelley, female    DOB: 30-Oct-1950, 61 y.o.   MRN: [id number]  HPI Here to follow up on depression and a rash presumed to be from taking Effexor. We switched her to Prozac, and she is doing better. The rash is fading away, though it still itches. Using Eucerin cream. Her moods are improved but she still gets irritated at times.    Review of Systems  Constitutional: Negative.   Psychiatric/Behavioral: Negative.        Objective:   Physical Exam  Skin: Skin is warm and dry. No rash noted. No erythema. No pallor.  Psychiatric: She has a normal mood and affect. Her behavior is normal. Thought content normal.          Assessment & Plan:  We will increase Prozac to 40 mg a day. Recheck in 2 weeks.

## 2011-12-11 NOTE — Patient Instructions (Signed)
  Latest dosing instructions   Total Sun Mon Tue Wed Thu Fri Sat   90  5 mg    5 mg      (5 mg1)    (5 mg1)     Addl Tabs  10 mg 15 mg 10 mg 10 mg 10 mg 15 mg 10 mg    (10 mg1) (10 mg1.5) (10 mg1) (10 mg1) (10 mg1) (10 mg1.5) (10 mg1)    

## 2012-01-08 ENCOUNTER — Ambulatory Visit (INDEPENDENT_AMBULATORY_CARE_PROVIDER_SITE_OTHER): Payer: BC Managed Care – PPO | Admitting: Family Medicine

## 2012-01-08 DIAGNOSIS — Z86718 Personal history of other venous thrombosis and embolism: Secondary | ICD-10-CM

## 2012-01-08 DIAGNOSIS — I824Y9 Acute embolism and thrombosis of unspecified deep veins of unspecified proximal lower extremity: Secondary | ICD-10-CM

## 2012-01-08 NOTE — Patient Instructions (Signed)
  Latest dosing instructions   Total Sun Mon Tue Wed Thu Fri Sat   90  5 mg    5 mg      (5 mg1)    (5 mg1)     Addl Tabs  10 mg 15 mg 10 mg 10 mg 10 mg 15 mg 10 mg    (10 mg1) (10 mg1.5) (10 mg1) (10 mg1) (10 mg1) (10 mg1.5) (10 mg1)    

## 2012-01-16 ENCOUNTER — Other Ambulatory Visit: Payer: Self-pay | Admitting: Family Medicine

## 2012-02-19 ENCOUNTER — Telehealth: Payer: Self-pay | Admitting: Family Medicine

## 2012-02-19 ENCOUNTER — Other Ambulatory Visit: Payer: Self-pay | Admitting: Family Medicine

## 2012-02-19 NOTE — Telephone Encounter (Signed)
Refill request for Hydrocodon/Acetaminophen 5-500 mg take 1 po q6hrs prn and pt last here on 12/11/11.

## 2012-02-21 MED ORDER — HYDROCODONE-ACETAMINOPHEN 5-500 MG PO TABS: 1.0000 | ORAL_TABLET | Freq: Four times a day (QID) | ORAL | Status: DC | PRN

## 2012-02-21 NOTE — Telephone Encounter (Signed)
Call in #[phone] with 5 rf 

## 2012-02-21 NOTE — Telephone Encounter (Signed)
Script called in

## 2012-03-08 ENCOUNTER — Ambulatory Visit (INDEPENDENT_AMBULATORY_CARE_PROVIDER_SITE_OTHER): Payer: BC Managed Care – PPO | Admitting: Family Medicine

## 2012-03-08 DIAGNOSIS — I824Y9 Acute embolism and thrombosis of unspecified deep veins of unspecified proximal lower extremity: Secondary | ICD-10-CM

## 2012-03-08 DIAGNOSIS — Z86718 Personal history of other venous thrombosis and embolism: Secondary | ICD-10-CM

## 2012-03-08 LAB — POCT INR: INR: 3.9

## 2012-03-08 NOTE — Patient Instructions (Signed)
  Latest dosing instructions   Total Sun Mon Tue Wed Thu Fri Sat   90  5 mg    5 mg      (5 mg1)    (5 mg1)     Addl Tabs  10 mg 15 mg 10 mg 10 mg 10 mg 15 mg 10 mg    (10 mg1) (10 mg1.5) (10 mg1) (10 mg1) (10 mg1) (10 mg1.5) (10 mg1)    

## 2012-03-22 ENCOUNTER — Ambulatory Visit (INDEPENDENT_AMBULATORY_CARE_PROVIDER_SITE_OTHER): Payer: BC Managed Care – PPO | Admitting: Family

## 2012-03-22 DIAGNOSIS — Z86718 Personal history of other venous thrombosis and embolism: Secondary | ICD-10-CM

## 2012-03-22 DIAGNOSIS — I824Y9 Acute embolism and thrombosis of unspecified deep veins of unspecified proximal lower extremity: Secondary | ICD-10-CM

## 2012-03-22 NOTE — Patient Instructions (Addendum)
  Latest dosing instructions   Total Jaime Kelley Tue Wed Thu Fri Sat   70 10 mg 10 mg 10 mg 10 mg 10 mg 10 mg 10 mg    (10 mg1) (10 mg1) (10 mg1) (10 mg1) (10 mg1) (10 mg1) (10 mg1)    Addl Tabs

## 2012-03-29 ENCOUNTER — Ambulatory Visit (INDEPENDENT_AMBULATORY_CARE_PROVIDER_SITE_OTHER): Payer: BC Managed Care – PPO | Admitting: Family

## 2012-03-29 DIAGNOSIS — Z86718 Personal history of other venous thrombosis and embolism: Secondary | ICD-10-CM

## 2012-03-29 DIAGNOSIS — I824Y9 Acute embolism and thrombosis of unspecified deep veins of unspecified proximal lower extremity: Secondary | ICD-10-CM

## 2012-03-29 LAB — POCT INR: INR: 3.3

## 2012-03-29 NOTE — Patient Instructions (Addendum)
Hold x 1day. 5mg  Tuesday and Thursday. 10 mg all other days, recheck in 2 week.     Latest dosing instructions   Total Glynis Smiles Tue Wed Thu Fri Sat   60 10 mg 10 mg 5 mg 10 mg 5 mg 10 mg 10 mg    (10 mg1) (10 mg1) (5 mg1) (10 mg1) (5 mg1) (10 mg1) (10 mg1)

## 2012-04-12 ENCOUNTER — Ambulatory Visit (INDEPENDENT_AMBULATORY_CARE_PROVIDER_SITE_OTHER): Payer: BC Managed Care – PPO | Admitting: Family

## 2012-04-12 DIAGNOSIS — I824Y9 Acute embolism and thrombosis of unspecified deep veins of unspecified proximal lower extremity: Secondary | ICD-10-CM

## 2012-04-12 LAB — POCT INR: INR: 1.6

## 2012-04-12 NOTE — Patient Instructions (Addendum)
Take 12.5mg  today then 10mg  a day,  except Wednesday 5mg  . Recheck in 2 weeks.    Latest dosing instructions   Total Glynis Smiles Tue Wed Thu Fri Sat   55 10 mg 10 mg 5 mg 5 mg 5 mg 10 mg 10 mg    (10 mg1) (10 mg1) (5 mg1) (10 mg0.5) (5 mg1) (10 mg1) (10 mg1)

## 2012-04-26 ENCOUNTER — Ambulatory Visit (INDEPENDENT_AMBULATORY_CARE_PROVIDER_SITE_OTHER): Payer: BC Managed Care – PPO | Admitting: Family

## 2012-04-26 DIAGNOSIS — I824Y9 Acute embolism and thrombosis of unspecified deep veins of unspecified proximal lower extremity: Secondary | ICD-10-CM

## 2012-04-26 NOTE — Patient Instructions (Addendum)
Then 10mg a day except Wednesday 7.5mg . Recheck in 4 weeks.     Latest dosing instructions   Total Sun Mon Tue Wed Thu Fri Sat   67.5 10 mg 10 mg 10 mg 5 mg 10 mg 10 mg 10 mg    (10 mg1) (10 mg1) (10 mg1) (5 mg1) (10 mg1) (10 mg1) (10 mg1)    Addl Tabs     2.5 mg          (5 mg0.5)       

## 2012-05-09 ENCOUNTER — Other Ambulatory Visit: Payer: Self-pay | Admitting: Family Medicine

## 2012-05-24 ENCOUNTER — Ambulatory Visit (INDEPENDENT_AMBULATORY_CARE_PROVIDER_SITE_OTHER): Payer: 59 | Admitting: Family

## 2012-05-24 DIAGNOSIS — I824Y9 Acute embolism and thrombosis of unspecified deep veins of unspecified proximal lower extremity: Secondary | ICD-10-CM

## 2012-05-24 LAB — POCT INR: INR: 2.5

## 2012-05-24 NOTE — Patient Instructions (Addendum)
Then 10mg  a day except Wednesday 7.5mg  . Recheck in 4 weeks.     Latest dosing instructions   Total Sun Mon Tue Wed Thu Fri Sat   67.5 10 mg 10 mg 10 mg 5 mg 10 mg 10 mg 10 mg    (10 mg1) (10 mg1) (10 mg1) (5 mg1) (10 mg1) (10 mg1) (10 mg1)    Addl Tabs     2.5 mg          (5 mg0.5)

## 2012-06-12 ENCOUNTER — Other Ambulatory Visit: Payer: Self-pay | Admitting: Family Medicine

## 2012-06-26 ENCOUNTER — Ambulatory Visit (INDEPENDENT_AMBULATORY_CARE_PROVIDER_SITE_OTHER): Payer: Self-pay | Admitting: Family

## 2012-06-26 DIAGNOSIS — I824Y9 Acute embolism and thrombosis of unspecified deep veins of unspecified proximal lower extremity: Secondary | ICD-10-CM

## 2012-06-26 NOTE — Patient Instructions (Addendum)
10mg a day except Wednesday 7.5mg . Recheck in 6 weeks.    Latest dosing instructions   Total Sun Mon Tue Wed Thu Fri Sat   67.5 10 mg 10 mg 10 mg 5 mg 10 mg 10 mg 10 mg    (10 mg1) (10 mg1) (10 mg1) (5 mg1) (10 mg1) (10 mg1) (10 mg1)    Addl Tabs     2.5 mg          (5 mg0.5)       

## 2012-07-31 ENCOUNTER — Other Ambulatory Visit: Payer: Self-pay | Admitting: Family Medicine

## 2012-08-07 ENCOUNTER — Ambulatory Visit (INDEPENDENT_AMBULATORY_CARE_PROVIDER_SITE_OTHER): Payer: Self-pay | Admitting: Family

## 2012-08-07 DIAGNOSIS — I824Y9 Acute embolism and thrombosis of unspecified deep veins of unspecified proximal lower extremity: Secondary | ICD-10-CM

## 2012-08-07 DIAGNOSIS — Z86718 Personal history of other venous thrombosis and embolism: Secondary | ICD-10-CM

## 2012-08-07 NOTE — Patient Instructions (Addendum)
10mg a day except Wednesday 7.5mg . Recheck in 6 weeks.    Latest dosing instructions   Total Sun Mon Tue Wed Thu Fri Sat   67.5 10 mg 10 mg 10 mg 5 mg 10 mg 10 mg 10 mg    (10 mg1) (10 mg1) (10 mg1) (5 mg1) (10 mg1) (10 mg1) (10 mg1)    Addl Tabs     2.5 mg          (5 mg0.5)       

## 2012-09-04 ENCOUNTER — Other Ambulatory Visit: Payer: Self-pay | Admitting: Family Medicine

## 2012-09-18 ENCOUNTER — Other Ambulatory Visit: Payer: Self-pay | Admitting: Family Medicine

## 2012-09-18 ENCOUNTER — Encounter: Payer: Self-pay | Admitting: Family

## 2012-09-18 NOTE — Telephone Encounter (Signed)
Call in #[phone] with 5 rf 

## 2012-10-04 ENCOUNTER — Ambulatory Visit (INDEPENDENT_AMBULATORY_CARE_PROVIDER_SITE_OTHER): Payer: Self-pay | Admitting: Family

## 2012-10-04 DIAGNOSIS — I824Y9 Acute embolism and thrombosis of unspecified deep veins of unspecified proximal lower extremity: Secondary | ICD-10-CM

## 2012-10-04 NOTE — Patient Instructions (Addendum)
10mg  a day except Wednesday 7.5mg  . Recheck in 6 weeks.    Latest dosing instructions   Total Sun Mon Tue Wed Thu Fri Sat   67.5 10 mg 10 mg 10 mg 5 mg 10 mg 10 mg 10 mg    (10 mg1) (10 mg1) (10 mg1) (5 mg1) (10 mg1) (10 mg1) (10 mg1)    Addl Tabs     2.5 mg          (5 mg0.5)

## 2012-10-28 ENCOUNTER — Other Ambulatory Visit: Payer: Self-pay | Admitting: Family

## 2012-11-12 ENCOUNTER — Telehealth: Payer: Self-pay | Admitting: Family Medicine

## 2012-11-12 MED ORDER — HYDROCODONE-ACETAMINOPHEN 5-325 MG PO TABS: 1.0000 | ORAL_TABLET | Freq: Four times a day (QID) | ORAL | Status: DC | PRN

## 2012-11-12 NOTE — Telephone Encounter (Signed)
Please call to cancel the Vicodin 5/500 and change to Vicodin 5/325. Call in #[phone] with 5 rf

## 2012-11-12 NOTE — Telephone Encounter (Signed)
I called in script 

## 2012-11-15 ENCOUNTER — Encounter: Payer: Self-pay | Admitting: Family

## 2012-11-20 ENCOUNTER — Telehealth: Payer: Self-pay | Admitting: Family Medicine

## 2012-11-26 ENCOUNTER — Telehealth: Payer: Self-pay | Admitting: Family

## 2012-11-26 NOTE — Telephone Encounter (Signed)
Please call and schedule INR visit asap.

## 2012-11-27 NOTE — Telephone Encounter (Signed)
Left message to advise pt to schedule pt/inr appt

## 2012-12-03 ENCOUNTER — Other Ambulatory Visit: Payer: Self-pay | Admitting: Family Medicine

## 2012-12-12 ENCOUNTER — Ambulatory Visit (INDEPENDENT_AMBULATORY_CARE_PROVIDER_SITE_OTHER): Payer: Medicaid Other | Admitting: Family

## 2012-12-12 DIAGNOSIS — I824Y9 Acute embolism and thrombosis of unspecified deep veins of unspecified proximal lower extremity: Secondary | ICD-10-CM

## 2012-12-12 NOTE — Patient Instructions (Addendum)
Take 1/2 tab today (5mg ). 10mg  a day except Wednesday 7.5mg  . Recheck in 4 weeks.  Anticoagulation Dose Instructions as of 12/12/2012     Glynis Smiles Tue Wed Thu Fri Sat   New Dose 10 mg 10 mg 10 mg 7.5 mg 10 mg 10 mg 10 mg    Description       Take 1/2 tab today (5mg ). 10mg  a day except Wednesday 7.5mg  . Recheck in 4 weeks.

## 2012-12-13 ENCOUNTER — Encounter: Payer: Self-pay | Admitting: Family

## 2012-12-18 ENCOUNTER — Other Ambulatory Visit: Payer: Self-pay | Admitting: Family Medicine

## 2013-01-09 ENCOUNTER — Ambulatory Visit (INDEPENDENT_AMBULATORY_CARE_PROVIDER_SITE_OTHER): Payer: Medicaid Other | Admitting: Family

## 2013-01-09 DIAGNOSIS — Z86718 Personal history of other venous thrombosis and embolism: Secondary | ICD-10-CM

## 2013-01-09 DIAGNOSIS — I824Y1 Acute embolism and thrombosis of unspecified deep veins of right proximal lower extremity: Secondary | ICD-10-CM

## 2013-01-09 DIAGNOSIS — I824Y9 Acute embolism and thrombosis of unspecified deep veins of unspecified proximal lower extremity: Secondary | ICD-10-CM

## 2013-01-09 LAB — POCT INR: INR: 3.7

## 2013-01-09 NOTE — Patient Instructions (Addendum)
Hold Coumadin today. Take 1/2 tab today (5mg ). 10mg  a day except Monday,  Wednesday, and Friday 7.5mg  . Recheck in 4 weeks per patient.   Anticoagulation Dose Instructions as of 01/09/2013     Jaime Kelley Tue Wed Thu Fri Sat   New Dose 10 mg 7.5 mg 10 mg 7.5 mg 10 mg 7.5 mg 10 mg    Description       Hold Coumadin today. Take 1/2 tab today (5mg ). 10mg  a day except Monday,  Wednesday, and Friday 7.5mg  . Recheck in 4 weeks per patient.

## 2013-01-22 ENCOUNTER — Other Ambulatory Visit: Payer: Self-pay | Admitting: Family Medicine

## 2013-02-02 ENCOUNTER — Other Ambulatory Visit: Payer: Self-pay | Admitting: Family Medicine

## 2013-02-03 NOTE — Telephone Encounter (Signed)
Can we refill this? 

## 2013-02-05 ENCOUNTER — Telehealth: Payer: Self-pay | Admitting: Family Medicine

## 2013-02-05 MED ORDER — METFORMIN HCL 500 MG PO TABS: ORAL_TABLET | ORAL | Status: DC

## 2013-02-05 NOTE — Telephone Encounter (Signed)
PT called to request a refill of her metFORMIN (GLUCOPHAGE) 500 MG tablet. She would like it sent to [facility] on [address]. Please assist.

## 2013-02-05 NOTE — Telephone Encounter (Signed)
Rx sent to pharmacy   

## 2013-02-06 ENCOUNTER — Encounter: Payer: Self-pay | Admitting: Family

## 2013-02-11 ENCOUNTER — Ambulatory Visit (INDEPENDENT_AMBULATORY_CARE_PROVIDER_SITE_OTHER): Payer: Medicaid Other | Admitting: Family

## 2013-02-11 DIAGNOSIS — I824Y1 Acute embolism and thrombosis of unspecified deep veins of right proximal lower extremity: Secondary | ICD-10-CM

## 2013-02-11 DIAGNOSIS — I824Y9 Acute embolism and thrombosis of unspecified deep veins of unspecified proximal lower extremity: Secondary | ICD-10-CM

## 2013-02-11 LAB — POCT INR: INR: 2.5

## 2013-02-11 NOTE — Telephone Encounter (Signed)
Opened in error

## 2013-02-11 NOTE — Patient Instructions (Addendum)
10mg  a day except Monday,  Wednesday, and Friday 7.5mg  . Recheck in 4 weeks per patient.   Anticoagulation Dose Instructions as of 02/11/2013     Glynis Smiles Tue Wed Thu Fri Sat   New Dose 10 mg 7.5 mg 10 mg 7.5 mg 10 mg 7.5 mg 10 mg

## 2013-03-04 ENCOUNTER — Other Ambulatory Visit: Payer: Self-pay | Admitting: Family Medicine

## 2013-03-04 ENCOUNTER — Encounter: Payer: Self-pay | Admitting: Family

## 2013-03-07 NOTE — Telephone Encounter (Signed)
Can we refill this? 

## 2013-03-11 ENCOUNTER — Ambulatory Visit (INDEPENDENT_AMBULATORY_CARE_PROVIDER_SITE_OTHER): Payer: Medicaid Other | Admitting: Family

## 2013-03-11 DIAGNOSIS — I824Y9 Acute embolism and thrombosis of unspecified deep veins of unspecified proximal lower extremity: Secondary | ICD-10-CM

## 2013-03-11 DIAGNOSIS — Z86718 Personal history of other venous thrombosis and embolism: Secondary | ICD-10-CM

## 2013-03-11 DIAGNOSIS — I824Y1 Acute embolism and thrombosis of unspecified deep veins of right proximal lower extremity: Secondary | ICD-10-CM

## 2013-03-11 NOTE — Patient Instructions (Addendum)
Hold Coumadin today. 10mg  a day except Monday,  Wednesday, and Friday 7.5mg  . Recheck in 4 weeks per patient.   Anticoagulation Dose Instructions as of 03/11/2013     Glynis Smiles Tue Wed Thu Fri Sat   New Dose 10 mg 7.5 mg 10 mg 7.5 mg 10 mg 7.5 mg 10 mg    Description       Hold Coumadin today. 10mg  a day except Monday,  Wednesday, and Friday 7.5mg  . Recheck in 4 weeks per patient.

## 2013-03-24 ENCOUNTER — Telehealth: Payer: Self-pay | Admitting: Family Medicine

## 2013-03-24 NOTE — Telephone Encounter (Signed)
Refill request for Synthroid 137 mcg take 1 po qd. Can we refill this?

## 2013-03-25 MED ORDER — SYNTHROID 137 MCG PO TABS: 137.0000 ug | ORAL_TABLET | Freq: Every day | ORAL | Status: DC

## 2013-03-25 NOTE — Telephone Encounter (Signed)
I sent script e-scribe and left voice message for pt with below information. 

## 2013-03-25 NOTE — Telephone Encounter (Signed)
Call in #30 only. She needs an OV soon  

## 2013-04-08 ENCOUNTER — Ambulatory Visit (INDEPENDENT_AMBULATORY_CARE_PROVIDER_SITE_OTHER): Payer: Medicaid Other | Admitting: Family

## 2013-04-08 DIAGNOSIS — Z86718 Personal history of other venous thrombosis and embolism: Secondary | ICD-10-CM

## 2013-04-08 DIAGNOSIS — I824Y9 Acute embolism and thrombosis of unspecified deep veins of unspecified proximal lower extremity: Secondary | ICD-10-CM

## 2013-04-08 DIAGNOSIS — I824Y1 Acute embolism and thrombosis of unspecified deep veins of right proximal lower extremity: Secondary | ICD-10-CM

## 2013-04-08 NOTE — Patient Instructions (Addendum)
Take extra 1/2 tablet today (12.5mg ) 10mg  a day except Monday,  Wednesday, and Friday 7.5mg  . Recheck in 4 weeks per patient.  Anticoagulation Dose Instructions as of 04/08/2013     Glynis Smiles Tue Wed Thu Fri Sat   New Dose 10 mg 7.5 mg 10 mg 7.5 mg 10 mg 7.5 mg 10 mg    Description       Take extra 1/2 tablet today (12.5mg ) 10mg  a day except Monday,  Wednesday, and Friday 7.5mg  . Recheck in 4 weeks per patient.

## 2013-04-09 ENCOUNTER — Ambulatory Visit: Payer: Self-pay | Admitting: Family Medicine

## 2013-04-28 ENCOUNTER — Telehealth: Payer: Self-pay | Admitting: Family Medicine

## 2013-04-28 MED ORDER — SYNTHROID 137 MCG PO TABS: 137.0000 ug | ORAL_TABLET | Freq: Every day | ORAL | Status: DC

## 2013-04-28 NOTE — Telephone Encounter (Signed)
Refill request for Synthroid, pt does have office visit scheduled for this month. I did send a 30 day supply to pharmacy and noted, pt must keep appointment for future refills.

## 2013-05-02 ENCOUNTER — Other Ambulatory Visit: Payer: Self-pay | Admitting: Family

## 2013-05-13 ENCOUNTER — Encounter: Payer: Self-pay | Admitting: Family Medicine

## 2013-05-13 ENCOUNTER — Ambulatory Visit (INDEPENDENT_AMBULATORY_CARE_PROVIDER_SITE_OTHER): Payer: Medicaid Other | Admitting: Family

## 2013-05-13 ENCOUNTER — Telehealth: Payer: Self-pay | Admitting: Family Medicine

## 2013-05-13 ENCOUNTER — Ambulatory Visit (INDEPENDENT_AMBULATORY_CARE_PROVIDER_SITE_OTHER): Payer: Medicaid Other | Admitting: Family Medicine

## 2013-05-13 VITALS — BP 164/100 | HR 88 | Temp 98.5°F | Wt 218.0 lb

## 2013-05-13 DIAGNOSIS — Z86718 Personal history of other venous thrombosis and embolism: Secondary | ICD-10-CM

## 2013-05-13 DIAGNOSIS — J4489 Other specified chronic obstructive pulmonary disease: Secondary | ICD-10-CM

## 2013-05-13 DIAGNOSIS — E039 Hypothyroidism, unspecified: Secondary | ICD-10-CM

## 2013-05-13 DIAGNOSIS — J449 Chronic obstructive pulmonary disease, unspecified: Secondary | ICD-10-CM

## 2013-05-13 DIAGNOSIS — I824Y9 Acute embolism and thrombosis of unspecified deep veins of unspecified proximal lower extremity: Secondary | ICD-10-CM

## 2013-05-13 DIAGNOSIS — I10 Essential (primary) hypertension: Secondary | ICD-10-CM

## 2013-05-13 DIAGNOSIS — E119 Type 2 diabetes mellitus without complications: Secondary | ICD-10-CM

## 2013-05-13 LAB — HEPATIC FUNCTION PANEL
ALT: 29 U/L (ref 0–35)
AST: 25 U/L (ref 0–37)
Albumin: 4 g/dL (ref 3.5–5.2)
Alkaline Phosphatase: 57 U/L (ref 39–117)
Total Protein: 7.1 g/dL (ref 6.0–8.3)

## 2013-05-13 LAB — BASIC METABOLIC PANEL
BUN: 10 mg/dL (ref 6–23)
Calcium: 9.5 mg/dL (ref 8.4–10.5)
Creatinine, Ser: 0.8 mg/dL (ref 0.4–1.2)
GFR: 72.85 mL/min (ref >60.00)
Glucose, Bld: 97 mg/dL (ref 70–99)
Sodium: 139 mEq/L (ref 135–145)

## 2013-05-13 LAB — CBC WITH DIFFERENTIAL/PLATELET
Basophils Absolute: 0 10*3/uL (ref 0.0–0.1)
Eosinophils Absolute: 0.2 10*3/uL (ref 0.0–0.7)
Hemoglobin: 15.6 g/dL — ABNORMAL HIGH (ref 12.0–15.0)
Lymphocytes Relative: 27.3 % (ref 12.0–46.0)
MCHC: 33.6 g/dL (ref 30.0–36.0)
Monocytes Relative: 7.7 % (ref 3.0–12.0)
Neutrophils Relative %: 62.6 % (ref 43.0–77.0)
Platelets: 287 10*3/uL (ref 150.0–400.0)
RDW: 15.9 % — ABNORMAL HIGH (ref 11.5–14.6)

## 2013-05-13 LAB — MICROALBUMIN / CREATININE URINE RATIO
Creatinine,U: 85.7 mg/dL
Microalb Creat Ratio: 3.7 mg/g (ref 0.0–30.0)
Microalb, Ur: 3.2 mg/dL — ABNORMAL HIGH (ref 0.0–1.9)

## 2013-05-13 LAB — POCT INR: INR: 2.7

## 2013-05-13 MED ORDER — LISINOPRIL 10 MG PO TABS: 10.0000 mg | ORAL_TABLET | Freq: Every day | ORAL | Status: DC

## 2013-05-13 MED ORDER — SYNTHROID 137 MCG PO TABS: 137.0000 ug | ORAL_TABLET | Freq: Every day | ORAL | Status: DC

## 2013-05-13 MED ORDER — PREDNISONE 20 MG PO TABS: 20.0000 mg | ORAL_TABLET | ORAL | Status: DC | PRN

## 2013-05-13 NOTE — Patient Instructions (Signed)
10mg  a day except Monday,  Wednesday, and Friday 7.5mg  . Recheck in 4 weeks per patient.   Anticoagulation Dose Instructions as of 05/13/2013     Glynis Smiles Tue Wed Thu Fri Sat   New Dose 10 mg 7.5 mg 10 mg 7.5 mg 10 mg 7.5 mg 10 mg    Description       10mg  a day except Monday,  Wednesday, and Friday 7.5mg  . Recheck in 4 weeks per patient.

## 2013-05-13 NOTE — Progress Notes (Signed)
  Subjective:    Patient ID: Jaime Kelley, female    DOB: Oct 30, 1950, 62 y.o.   MRN: [id number]  HPI Here to follow up. She feels well except for some SOB when the weather is hot and humid. She sees Dr. Dierdre Forth on a regular basis.    Review of Systems  Constitutional: Negative.   Respiratory: Positive for shortness of breath. Negative for cough, chest tightness and wheezing.   Cardiovascular: Negative.        Objective:   Physical Exam  Constitutional: She appears well-developed and well-nourished.  Neck: No thyromegaly present.  Cardiovascular: Normal rate, regular rhythm, normal heart sounds and intact distal pulses.   Pulmonary/Chest: Effort normal and breath sounds normal. No respiratory distress. She has no wheezes. She has no rales.  Lymphadenopathy:    She has no cervical adenopathy.          Assessment & Plan:  Her COPD is stable. We will get labs today to check her thyroid level and her A1c. Add Lisinopril for the BP.

## 2013-05-13 NOTE — Telephone Encounter (Signed)
Okay. Call in Proair to use 2 puffs q 4 hours prn SOB, #1 with 11 rf

## 2013-05-13 NOTE — Telephone Encounter (Signed)
Ventolin is not a preferred drug on Medicaid. Can we substitute for either ProAir or Proventil and send new script to pharmacy?

## 2013-05-13 NOTE — Addendum Note (Signed)
Addended by: Serena Colonel on: 05/13/2013 09:18 AM   Modules accepted: Orders

## 2013-05-14 NOTE — Progress Notes (Signed)
Quick Note:  I spoke with pt ______ 

## 2013-05-15 MED ORDER — ALBUTEROL SULFATE HFA 108 (90 BASE) MCG/ACT IN AERS: 2.0000 | INHALATION_SPRAY | RESPIRATORY_TRACT | Status: DC | PRN

## 2013-05-15 NOTE — Telephone Encounter (Signed)
I sent script e-scribe. 

## 2013-05-19 DIAGNOSIS — Z86711 Personal history of pulmonary embolism: Secondary | ICD-10-CM | POA: Insufficient documentation

## 2013-05-19 DIAGNOSIS — Z720 Tobacco use: Secondary | ICD-10-CM | POA: Insufficient documentation

## 2013-05-26 DIAGNOSIS — F329 Major depressive disorder, single episode, unspecified: Secondary | ICD-10-CM | POA: Insufficient documentation

## 2013-06-03 ENCOUNTER — Telehealth: Payer: Self-pay | Admitting: Family Medicine

## 2013-06-03 MED ORDER — METFORMIN HCL 500 MG PO TABS: ORAL_TABLET | ORAL | Status: DC

## 2013-06-03 NOTE — Telephone Encounter (Signed)
Refill request for Metformin 500 mg and I did send script e-scribe.

## 2013-06-05 ENCOUNTER — Telehealth: Payer: Self-pay | Admitting: Family Medicine

## 2013-06-05 NOTE — Telephone Encounter (Signed)
Pt cancelled coum appt and refused to resc. Pt states she will call back.

## 2013-06-09 ENCOUNTER — Ambulatory Visit: Payer: Medicaid Other

## 2013-06-09 ENCOUNTER — Other Ambulatory Visit: Payer: Self-pay | Admitting: Family Medicine

## 2013-06-09 NOTE — Telephone Encounter (Signed)
Call in #[phone] with 5 rf 

## 2013-06-28 DIAGNOSIS — Z7901 Long term (current) use of anticoagulants: Secondary | ICD-10-CM | POA: Insufficient documentation

## 2013-07-29 ENCOUNTER — Other Ambulatory Visit: Payer: Self-pay | Admitting: Family Medicine

## 2013-07-29 DIAGNOSIS — Z86711 Personal history of pulmonary embolism: Secondary | ICD-10-CM | POA: Diagnosis not present

## 2013-07-29 DIAGNOSIS — Z7901 Long term (current) use of anticoagulants: Secondary | ICD-10-CM | POA: Diagnosis not present

## 2013-07-29 DIAGNOSIS — Z5181 Encounter for therapeutic drug level monitoring: Secondary | ICD-10-CM | POA: Diagnosis not present

## 2013-08-07 DIAGNOSIS — Z86711 Personal history of pulmonary embolism: Secondary | ICD-10-CM | POA: Diagnosis not present

## 2013-08-07 DIAGNOSIS — F329 Major depressive disorder, single episode, unspecified: Secondary | ICD-10-CM | POA: Diagnosis not present

## 2013-08-07 DIAGNOSIS — F411 Generalized anxiety disorder: Secondary | ICD-10-CM | POA: Diagnosis not present

## 2013-08-07 DIAGNOSIS — Z23 Encounter for immunization: Secondary | ICD-10-CM | POA: Diagnosis not present

## 2013-08-07 DIAGNOSIS — Z7901 Long term (current) use of anticoagulants: Secondary | ICD-10-CM | POA: Diagnosis not present

## 2013-08-07 DIAGNOSIS — I10 Essential (primary) hypertension: Secondary | ICD-10-CM | POA: Diagnosis not present

## 2013-08-14 DIAGNOSIS — Z86711 Personal history of pulmonary embolism: Secondary | ICD-10-CM | POA: Diagnosis not present

## 2013-08-14 DIAGNOSIS — Z7901 Long term (current) use of anticoagulants: Secondary | ICD-10-CM | POA: Diagnosis not present

## 2013-08-19 ENCOUNTER — Telehealth: Payer: Self-pay | Admitting: Family Medicine

## 2013-08-19 MED ORDER — HYDROCODONE-ACETAMINOPHEN 5-325 MG PO TABS: ORAL_TABLET | ORAL | Status: DC

## 2013-08-19 NOTE — Telephone Encounter (Signed)
Scripts are ready for pick up and I spoke with pt. 

## 2013-08-19 NOTE — Telephone Encounter (Signed)
done

## 2013-08-19 NOTE — Telephone Encounter (Signed)
Pt needs new rx hydrocodone °

## 2013-09-10 ENCOUNTER — Encounter: Payer: Self-pay | Admitting: *Deleted

## 2013-09-11 ENCOUNTER — Ambulatory Visit (INDEPENDENT_AMBULATORY_CARE_PROVIDER_SITE_OTHER): Payer: Medicare Other | Admitting: General Practice

## 2013-09-11 DIAGNOSIS — I824Y9 Acute embolism and thrombosis of unspecified deep veins of unspecified proximal lower extremity: Secondary | ICD-10-CM

## 2013-09-11 DIAGNOSIS — Z79899 Other long term (current) drug therapy: Secondary | ICD-10-CM | POA: Diagnosis not present

## 2013-09-11 DIAGNOSIS — Z86718 Personal history of other venous thrombosis and embolism: Secondary | ICD-10-CM

## 2013-09-11 DIAGNOSIS — Z7901 Long term (current) use of anticoagulants: Secondary | ICD-10-CM | POA: Diagnosis not present

## 2013-09-16 ENCOUNTER — Ambulatory Visit (INDEPENDENT_AMBULATORY_CARE_PROVIDER_SITE_OTHER): Payer: Medicare Other | Admitting: Family Medicine

## 2013-09-16 ENCOUNTER — Encounter: Payer: Self-pay | Admitting: Family Medicine

## 2013-09-16 VITALS — BP 136/82 | HR 72 | Temp 98.2°F | Ht 62.0 in | Wt 217.0 lb

## 2013-09-16 DIAGNOSIS — I10 Essential (primary) hypertension: Secondary | ICD-10-CM | POA: Diagnosis not present

## 2013-09-16 DIAGNOSIS — F411 Generalized anxiety disorder: Secondary | ICD-10-CM | POA: Diagnosis not present

## 2013-09-16 DIAGNOSIS — J449 Chronic obstructive pulmonary disease, unspecified: Secondary | ICD-10-CM | POA: Diagnosis not present

## 2013-09-16 MED ORDER — SERTRALINE HCL 100 MG PO TABS: 100.0000 mg | ORAL_TABLET | Freq: Every day | ORAL | Status: DC

## 2013-09-16 NOTE — Progress Notes (Signed)
  Subjective:    Patient ID: Jaime Kelley, female    DOB: Nov 05, 1950, 62 y.o.   MRN: [id number]  HPI Here to re-establish with [place] after seeing Dr. Anna Genre at [facility] for 4 months. She had Medicaid at that time but now she can see [place] again. We had started her on Lisinopril last summer for HTN and this was increased to 40 mg a day. Eventually Amlodipine was added, and now her BP is well controlled. She was also started on Zoloft for anxiety, and this works well. She feels great today.    Review of Systems  Constitutional: Negative.   Respiratory: Negative.   Cardiovascular: Negative.   Psychiatric/Behavioral: Negative.        Objective:   Physical Exam  Constitutional: She is oriented to person, place, and time. She appears well-developed and well-nourished.  Cardiovascular: Normal rate, normal heart sounds and intact distal pulses.   Pulmonary/Chest: Effort normal and breath sounds normal.  Neurological: She is alert and oriented to person, place, and time.          Assessment & Plan:  She is doing well. Stay on current meds

## 2013-09-16 NOTE — Progress Notes (Signed)
Pre visit review using our clinic review tool, if applicable. No additional management support is needed unless otherwise documented below in the visit note. 

## 2013-10-02 ENCOUNTER — Other Ambulatory Visit: Payer: Self-pay | Admitting: Family Medicine

## 2013-10-03 NOTE — Telephone Encounter (Signed)
Refill all for one year 

## 2013-10-06 ENCOUNTER — Other Ambulatory Visit: Payer: Self-pay | Admitting: Family Medicine

## 2013-10-09 ENCOUNTER — Ambulatory Visit (INDEPENDENT_AMBULATORY_CARE_PROVIDER_SITE_OTHER): Payer: Medicare Other | Admitting: General Practice

## 2013-10-09 DIAGNOSIS — Z86718 Personal history of other venous thrombosis and embolism: Secondary | ICD-10-CM | POA: Diagnosis not present

## 2013-10-09 NOTE — Progress Notes (Signed)
Pre-visit discussion using our clinic review tool. No additional management support is needed unless otherwise documented below in the visit note.  

## 2013-10-10 ENCOUNTER — Encounter: Payer: Self-pay | Admitting: Family Medicine

## 2013-10-14 DIAGNOSIS — M6530 Trigger finger, unspecified finger: Secondary | ICD-10-CM | POA: Diagnosis not present

## 2013-10-14 DIAGNOSIS — M069 Rheumatoid arthritis, unspecified: Secondary | ICD-10-CM | POA: Diagnosis not present

## 2013-10-14 DIAGNOSIS — M159 Polyosteoarthritis, unspecified: Secondary | ICD-10-CM | POA: Diagnosis not present

## 2013-11-07 ENCOUNTER — Other Ambulatory Visit: Payer: Self-pay | Admitting: General Practice

## 2013-11-07 ENCOUNTER — Other Ambulatory Visit: Payer: Self-pay | Admitting: Family Medicine

## 2013-11-07 MED ORDER — WARFARIN SODIUM 10 MG PO TABS: ORAL_TABLET | ORAL | Status: DC

## 2013-11-20 ENCOUNTER — Ambulatory Visit (INDEPENDENT_AMBULATORY_CARE_PROVIDER_SITE_OTHER): Payer: Medicare Other | Admitting: General Practice

## 2013-11-20 DIAGNOSIS — I824Y9 Acute embolism and thrombosis of unspecified deep veins of unspecified proximal lower extremity: Secondary | ICD-10-CM | POA: Diagnosis not present

## 2013-11-20 DIAGNOSIS — Z86718 Personal history of other venous thrombosis and embolism: Secondary | ICD-10-CM

## 2013-11-20 DIAGNOSIS — Z5181 Encounter for therapeutic drug level monitoring: Secondary | ICD-10-CM | POA: Diagnosis not present

## 2013-11-20 LAB — POCT INR: INR: 2.2

## 2013-11-20 NOTE — Progress Notes (Signed)
Pre-visit discussion using our clinic review tool. No additional management support is needed unless otherwise documented below in the visit note.  

## 2013-11-21 ENCOUNTER — Telehealth: Payer: Self-pay | Admitting: Family Medicine

## 2013-11-21 ENCOUNTER — Other Ambulatory Visit: Payer: Self-pay | Admitting: General Practice

## 2013-11-21 MED ORDER — WARFARIN SODIUM 7.5 MG PO TABS
ORAL_TABLET | ORAL | Status: DC
Start: 2013-11-21 — End: 2014-03-11

## 2013-11-21 NOTE — Telephone Encounter (Signed)
Pt needs new rx hydrocodone °

## 2013-11-24 MED ORDER — HYDROCODONE-ACETAMINOPHEN 5-325 MG PO TABS: ORAL_TABLET | ORAL | Status: DC

## 2013-11-24 NOTE — Telephone Encounter (Signed)
Script is ready for pick up and I spoke with pt.  

## 2013-11-24 NOTE — Telephone Encounter (Signed)
done

## 2013-12-24 DIAGNOSIS — M069 Rheumatoid arthritis, unspecified: Secondary | ICD-10-CM | POA: Diagnosis not present

## 2013-12-24 DIAGNOSIS — M25569 Pain in unspecified knee: Secondary | ICD-10-CM | POA: Diagnosis not present

## 2013-12-24 DIAGNOSIS — M159 Polyosteoarthritis, unspecified: Secondary | ICD-10-CM | POA: Diagnosis not present

## 2013-12-24 DIAGNOSIS — M6530 Trigger finger, unspecified finger: Secondary | ICD-10-CM | POA: Diagnosis not present

## 2014-01-01 ENCOUNTER — Ambulatory Visit (INDEPENDENT_AMBULATORY_CARE_PROVIDER_SITE_OTHER): Payer: Medicare Other | Admitting: General Practice

## 2014-01-01 DIAGNOSIS — Z86718 Personal history of other venous thrombosis and embolism: Secondary | ICD-10-CM

## 2014-01-01 DIAGNOSIS — Z7901 Long term (current) use of anticoagulants: Secondary | ICD-10-CM

## 2014-01-01 LAB — POCT INR: INR: 2.1

## 2014-01-01 NOTE — Progress Notes (Signed)
Pre visit review using our clinic review tool, if applicable. No additional management support is needed unless otherwise documented below in the visit note. 

## 2014-01-04 ENCOUNTER — Other Ambulatory Visit: Payer: Self-pay | Admitting: Family Medicine

## 2014-01-19 DIAGNOSIS — M159 Polyosteoarthritis, unspecified: Secondary | ICD-10-CM | POA: Diagnosis not present

## 2014-01-19 DIAGNOSIS — M069 Rheumatoid arthritis, unspecified: Secondary | ICD-10-CM | POA: Diagnosis not present

## 2014-01-19 DIAGNOSIS — M1710 Unilateral primary osteoarthritis, unspecified knee: Secondary | ICD-10-CM | POA: Diagnosis not present

## 2014-01-19 DIAGNOSIS — IMO0002 Reserved for concepts with insufficient information to code with codable children: Secondary | ICD-10-CM | POA: Diagnosis not present

## 2014-01-19 DIAGNOSIS — M25569 Pain in unspecified knee: Secondary | ICD-10-CM | POA: Diagnosis not present

## 2014-02-11 ENCOUNTER — Other Ambulatory Visit: Payer: Self-pay | Admitting: Internal Medicine

## 2014-02-11 ENCOUNTER — Telehealth: Payer: Self-pay | Admitting: Family Medicine

## 2014-02-11 DIAGNOSIS — M25562 Pain in left knee: Secondary | ICD-10-CM

## 2014-02-11 NOTE — Telephone Encounter (Signed)
Pt is needing new rx for HYDROcodone-acetaminophen (NORCO/VICODIN) 5-325 MG per tablet, pt states if at all possible she would like to pick up the rx on tomorrow, pt states she has an appt at 1:00 for her INR.  Please make pt aware.

## 2014-02-12 ENCOUNTER — Ambulatory Visit: Payer: Medicare Other

## 2014-02-12 ENCOUNTER — Ambulatory Visit (INDEPENDENT_AMBULATORY_CARE_PROVIDER_SITE_OTHER): Payer: Medicare Other | Admitting: General Practice

## 2014-02-12 DIAGNOSIS — Z86718 Personal history of other venous thrombosis and embolism: Secondary | ICD-10-CM | POA: Diagnosis not present

## 2014-02-12 DIAGNOSIS — I824Y9 Acute embolism and thrombosis of unspecified deep veins of unspecified proximal lower extremity: Secondary | ICD-10-CM | POA: Diagnosis not present

## 2014-02-12 DIAGNOSIS — Z5181 Encounter for therapeutic drug level monitoring: Secondary | ICD-10-CM

## 2014-02-12 LAB — POCT INR: INR: 2.2

## 2014-02-12 MED ORDER — HYDROCODONE-ACETAMINOPHEN 5-325 MG PO TABS: ORAL_TABLET | ORAL | Status: DC

## 2014-02-12 NOTE — Progress Notes (Signed)
Pre visit review using our clinic review tool, if applicable. No additional management support is needed unless otherwise documented below in the visit note. 

## 2014-02-12 NOTE — Telephone Encounter (Signed)
Script is ready for pick up and I left a voice message for pt. 

## 2014-02-12 NOTE — Telephone Encounter (Signed)
done

## 2014-02-13 ENCOUNTER — Ambulatory Visit
Admission: RE | Admit: 2014-02-13 | Discharge: 2014-02-13 | Disposition: A | Discharge status: Home / Self Care | Payer: Medicare Other | Source: Ambulatory Visit | Attending: Internal Medicine | Admitting: Internal Medicine

## 2014-02-13 DIAGNOSIS — M25469 Effusion, unspecified knee: Secondary | ICD-10-CM | POA: Diagnosis not present

## 2014-02-13 DIAGNOSIS — M7120 Synovial cyst of popliteal space [Baker], unspecified knee: Secondary | ICD-10-CM | POA: Diagnosis not present

## 2014-02-13 DIAGNOSIS — M25562 Pain in left knee: Secondary | ICD-10-CM

## 2014-03-03 ENCOUNTER — Ambulatory Visit
Admission: RE | Admit: 2014-03-03 | Discharge: 2014-03-03 | Disposition: A | Discharge status: Home / Self Care | Payer: Medicare Other | Source: Ambulatory Visit | Attending: Orthopedic Surgery | Admitting: Orthopedic Surgery

## 2014-03-03 ENCOUNTER — Other Ambulatory Visit: Payer: Self-pay | Admitting: Orthopedic Surgery

## 2014-03-03 DIAGNOSIS — M25569 Pain in unspecified knee: Secondary | ICD-10-CM | POA: Diagnosis not present

## 2014-03-03 DIAGNOSIS — Z419 Encounter for procedure for purposes other than remedying health state, unspecified: Secondary | ICD-10-CM

## 2014-03-03 DIAGNOSIS — J438 Other emphysema: Secondary | ICD-10-CM | POA: Diagnosis not present

## 2014-03-03 DIAGNOSIS — Z01818 Encounter for other preprocedural examination: Secondary | ICD-10-CM | POA: Diagnosis not present

## 2014-03-09 ENCOUNTER — Telehealth: Payer: Self-pay | Admitting: Family Medicine

## 2014-03-09 NOTE — Telephone Encounter (Signed)
Can you call pt to schedule a surgical clearance 30 minute visit, per Dr. Sarajane Jews okay to schedule as soon as possible?

## 2014-03-10 NOTE — Telephone Encounter (Signed)
Pt scheduled for wed, 5/20 at 19:45 am.

## 2014-03-11 ENCOUNTER — Telehealth: Payer: Self-pay | Admitting: Family Medicine

## 2014-03-11 ENCOUNTER — Ambulatory Visit (INDEPENDENT_AMBULATORY_CARE_PROVIDER_SITE_OTHER): Payer: Medicare Other | Admitting: Family Medicine

## 2014-03-11 ENCOUNTER — Encounter: Payer: Self-pay | Admitting: Family Medicine

## 2014-03-11 VITALS — BP 172/105 | HR 102 | Temp 99.0°F | Ht 62.0 in | Wt 213.0 lb

## 2014-03-11 DIAGNOSIS — J438 Other emphysema: Secondary | ICD-10-CM

## 2014-03-11 DIAGNOSIS — M069 Rheumatoid arthritis, unspecified: Secondary | ICD-10-CM

## 2014-03-11 DIAGNOSIS — Z01818 Encounter for other preprocedural examination: Secondary | ICD-10-CM

## 2014-03-11 DIAGNOSIS — I10 Essential (primary) hypertension: Secondary | ICD-10-CM

## 2014-03-11 MED ORDER — ENOXAPARIN SODIUM 100 MG/ML ~~LOC~~ SOLN: 100.0000 mg | Freq: Two times a day (BID) | SUBCUTANEOUS | Status: DC

## 2014-03-11 MED ORDER — HYDROCHLOROTHIAZIDE 25 MG PO TABS: 25.0000 mg | ORAL_TABLET | Freq: Every day | ORAL | Status: DC

## 2014-03-11 NOTE — Telephone Encounter (Signed)
Relevant patient education mailed to patient.  

## 2014-03-11 NOTE — Progress Notes (Signed)
Pre visit review using our clinic review tool, if applicable. No additional management support is needed unless otherwise documented below in the visit note. 

## 2014-03-11 NOTE — Progress Notes (Signed)
   Subjective:    Patient ID: Jaime Kelley, female    DOB: 10-21-1951, 63 y.o.   MRN: [id number]  HPI 63 yr old female for a presurgical evaluation. She will be scheduled for a left knee arthroscopy per Dr. Veverly Fells sometime soon. This will be done under regional anesthesia. She feels well other than the knee pain. She takes Coumadin due to a hx of PEs.    Review of Systems  Constitutional: Negative.   Respiratory: Negative.   Cardiovascular: Negative.   Gastrointestinal: Negative.   Genitourinary: Negative.   Neurological: Negative.        Objective:   Physical Exam  Constitutional: She is oriented to person, place, and time. She appears well-developed and well-nourished.  Neck: No thyromegaly present.  Cardiovascular: Normal rate, regular rhythm, normal heart sounds and intact distal pulses.  Exam reveals no gallop and no friction rub.   No murmur heard. EKG normal   Pulmonary/Chest: Effort normal and breath sounds normal.  Abdominal: Soft. Bowel sounds are normal. She exhibits no distension and no mass. There is no tenderness. There is no rebound and no guarding.  Lymphadenopathy:    She has no cervical adenopathy.  Neurological: She is alert and oriented to person, place, and time.          Assessment & Plan:  She is cleared for surgery. I have advised her to stop her Coumadin 5 days prior to the surgery and to resume this the day after surgery. During this time she will take 100 mg of Lovenox bid. She will return here for an INR check 3 days after resuming the Coumadin.

## 2014-03-17 ENCOUNTER — Telehealth: Payer: Self-pay | Admitting: Family Medicine

## 2014-03-17 ENCOUNTER — Other Ambulatory Visit: Payer: Self-pay | Admitting: Family Medicine

## 2014-03-17 NOTE — Telephone Encounter (Signed)
I was going to forward, however [facility] is out till 03/23/14.

## 2014-03-17 NOTE — Telephone Encounter (Deleted)
Pt states [facility] has not received surgery clearance letter yet. Waiting to sche after letter received.  Fax [phone]

## 2014-03-17 NOTE — Telephone Encounter (Signed)
Pt states [facility] has not received surgery clearance letter yet. Waiting to sche after letter received.  Fax [phone] °

## 2014-03-18 NOTE — Telephone Encounter (Signed)
I spoke with pt, this was faxed on 03/11/14. I printed the office note from that day and it states that pt is cleared for surgery. I faxed to below number.

## 2014-03-26 ENCOUNTER — Ambulatory Visit (INDEPENDENT_AMBULATORY_CARE_PROVIDER_SITE_OTHER): Payer: Medicare Other | Admitting: General Practice

## 2014-03-26 DIAGNOSIS — Z5181 Encounter for therapeutic drug level monitoring: Secondary | ICD-10-CM | POA: Diagnosis not present

## 2014-03-26 DIAGNOSIS — Z86718 Personal history of other venous thrombosis and embolism: Secondary | ICD-10-CM | POA: Diagnosis not present

## 2014-03-26 DIAGNOSIS — I824Y9 Acute embolism and thrombosis of unspecified deep veins of unspecified proximal lower extremity: Secondary | ICD-10-CM

## 2014-03-26 LAB — POCT INR: INR: 2.3

## 2014-03-26 NOTE — Progress Notes (Signed)
Pre visit review using our clinic review tool, if applicable. No additional management support is needed unless otherwise documented below in the visit note. 

## 2014-04-01 DIAGNOSIS — Y999 Unspecified external cause status: Secondary | ICD-10-CM | POA: Diagnosis not present

## 2014-04-01 DIAGNOSIS — M2390 Unspecified internal derangement of unspecified knee: Secondary | ICD-10-CM | POA: Diagnosis not present

## 2014-04-01 DIAGNOSIS — S83289A Other tear of lateral meniscus, current injury, unspecified knee, initial encounter: Secondary | ICD-10-CM | POA: Diagnosis not present

## 2014-04-01 DIAGNOSIS — X58XXXA Exposure to other specified factors, initial encounter: Secondary | ICD-10-CM | POA: Diagnosis not present

## 2014-04-01 DIAGNOSIS — M9420 Chondromalacia, unspecified site: Secondary | ICD-10-CM | POA: Diagnosis not present

## 2014-04-01 DIAGNOSIS — Y929 Unspecified place or not applicable: Secondary | ICD-10-CM | POA: Diagnosis not present

## 2014-04-01 DIAGNOSIS — M7120 Synovial cyst of popliteal space [Baker], unspecified knee: Secondary | ICD-10-CM | POA: Diagnosis not present

## 2014-04-01 DIAGNOSIS — Y939 Activity, unspecified: Secondary | ICD-10-CM | POA: Diagnosis not present

## 2014-04-01 DIAGNOSIS — M6749 Ganglion, multiple sites: Secondary | ICD-10-CM | POA: Diagnosis not present

## 2014-04-01 DIAGNOSIS — IMO0002 Reserved for concepts with insufficient information to code with codable children: Secondary | ICD-10-CM | POA: Diagnosis not present

## 2014-04-06 ENCOUNTER — Ambulatory Visit (INDEPENDENT_AMBULATORY_CARE_PROVIDER_SITE_OTHER): Payer: Medicare Other | Admitting: General Practice

## 2014-04-06 DIAGNOSIS — Z86718 Personal history of other venous thrombosis and embolism: Secondary | ICD-10-CM

## 2014-04-06 DIAGNOSIS — Z5181 Encounter for therapeutic drug level monitoring: Secondary | ICD-10-CM

## 2014-04-06 DIAGNOSIS — I824Y9 Acute embolism and thrombosis of unspecified deep veins of unspecified proximal lower extremity: Secondary | ICD-10-CM

## 2014-04-06 LAB — POCT INR: INR: 1.4

## 2014-04-06 NOTE — Progress Notes (Signed)
Pre visit review using our clinic review tool, if applicable. No additional management support is needed unless otherwise documented below in the visit note. 

## 2014-05-04 ENCOUNTER — Ambulatory Visit: Payer: Medicare Other

## 2014-05-05 DIAGNOSIS — M159 Polyosteoarthritis, unspecified: Secondary | ICD-10-CM | POA: Diagnosis not present

## 2014-05-05 DIAGNOSIS — M25569 Pain in unspecified knee: Secondary | ICD-10-CM | POA: Diagnosis not present

## 2014-05-05 DIAGNOSIS — M069 Rheumatoid arthritis, unspecified: Secondary | ICD-10-CM | POA: Diagnosis not present

## 2014-05-07 ENCOUNTER — Ambulatory Visit (INDEPENDENT_AMBULATORY_CARE_PROVIDER_SITE_OTHER): Payer: Medicare Other | Admitting: General Practice

## 2014-05-07 DIAGNOSIS — Z5181 Encounter for therapeutic drug level monitoring: Secondary | ICD-10-CM | POA: Diagnosis not present

## 2014-05-07 DIAGNOSIS — Z86718 Personal history of other venous thrombosis and embolism: Secondary | ICD-10-CM | POA: Diagnosis not present

## 2014-05-07 LAB — POCT INR: INR: 2.4

## 2014-05-07 NOTE — Progress Notes (Signed)
Pre visit review using our clinic review tool, if applicable. No additional management support is needed unless otherwise documented below in the visit note. 

## 2014-05-22 ENCOUNTER — Telehealth: Payer: Self-pay | Admitting: Family Medicine

## 2014-05-22 MED ORDER — HYDROCODONE-ACETAMINOPHEN 5-325 MG PO TABS: ORAL_TABLET | ORAL | Status: DC

## 2014-05-22 NOTE — Telephone Encounter (Signed)
done

## 2014-05-22 NOTE — Telephone Encounter (Signed)
Pt requesting refill of HYDROcodone-acetaminophen (NORCO/VICODIN) 5-325 MG per tablet 

## 2014-05-22 NOTE — Telephone Encounter (Signed)
Script is ready for pick up and I spoke with pt.  

## 2014-05-25 ENCOUNTER — Other Ambulatory Visit: Payer: Self-pay | Admitting: Family Medicine

## 2014-06-11 ENCOUNTER — Ambulatory Visit: Payer: Medicare Other

## 2014-06-11 ENCOUNTER — Ambulatory Visit (INDEPENDENT_AMBULATORY_CARE_PROVIDER_SITE_OTHER): Payer: Medicare Other | Admitting: Family

## 2014-06-11 DIAGNOSIS — I824Y9 Acute embolism and thrombosis of unspecified deep veins of unspecified proximal lower extremity: Secondary | ICD-10-CM

## 2014-06-11 DIAGNOSIS — Z5181 Encounter for therapeutic drug level monitoring: Secondary | ICD-10-CM | POA: Diagnosis not present

## 2014-06-11 DIAGNOSIS — Z86718 Personal history of other venous thrombosis and embolism: Secondary | ICD-10-CM | POA: Diagnosis not present

## 2014-06-11 LAB — POCT INR: INR: 2.5

## 2014-06-11 NOTE — Patient Instructions (Signed)
Continue to take 7.5 on Tues and Thursday and 10 mg all other days. Re-check in 4 weeks. Anticoagulation Dose Instructions as of 06/11/2014     Jaime Kelley Tue Wed Thu Fri Sat   New Dose 10 mg 10 mg 7.5 mg 10 mg 7.5 mg 10 mg 10 mg    Description       Continue to take 7.5 on Tues and Thursday and 10 mg all other days. Re-check in 4 weeks.

## 2014-06-15 ENCOUNTER — Other Ambulatory Visit: Payer: Self-pay | Admitting: Family Medicine

## 2014-06-24 DIAGNOSIS — Z9889 Other specified postprocedural states: Secondary | ICD-10-CM | POA: Diagnosis not present

## 2014-06-30 ENCOUNTER — Other Ambulatory Visit: Payer: Self-pay | Admitting: Family

## 2014-07-09 ENCOUNTER — Ambulatory Visit (INDEPENDENT_AMBULATORY_CARE_PROVIDER_SITE_OTHER): Payer: Medicare Other | Admitting: Family

## 2014-07-09 DIAGNOSIS — Z5181 Encounter for therapeutic drug level monitoring: Secondary | ICD-10-CM | POA: Diagnosis not present

## 2014-07-09 DIAGNOSIS — I824Y9 Acute embolism and thrombosis of unspecified deep veins of unspecified proximal lower extremity: Secondary | ICD-10-CM | POA: Diagnosis not present

## 2014-07-09 LAB — POCT INR: INR: 1.8

## 2014-07-09 NOTE — Patient Instructions (Addendum)
Take an extra 1/2 tab today only. Continue to take 7.5 on Tues and Thursday and 10 mg all other days. Re-check in 4 weeks.  Anticoagulation Dose Instructions as of 07/09/2014     Dorene Grebe Tue Wed Thu Fri Sat   New Dose 10 mg 10 mg 7.5 mg 10 mg 7.5 mg 10 mg 10 mg    Description       Take an extra 1/2 tab today only. Continue to take 7.5 on Tues and Thursday and 10 mg all other days. Re-check in 4 weeks.

## 2014-07-22 DIAGNOSIS — Z9889 Other specified postprocedural states: Secondary | ICD-10-CM | POA: Diagnosis not present

## 2014-07-22 DIAGNOSIS — M1710 Unilateral primary osteoarthritis, unspecified knee: Secondary | ICD-10-CM | POA: Diagnosis not present

## 2014-07-23 ENCOUNTER — Other Ambulatory Visit: Payer: Self-pay | Admitting: Family Medicine

## 2014-08-01 DIAGNOSIS — M25562 Pain in left knee: Secondary | ICD-10-CM | POA: Diagnosis not present

## 2014-08-05 DIAGNOSIS — M25562 Pain in left knee: Secondary | ICD-10-CM | POA: Diagnosis not present

## 2014-08-05 DIAGNOSIS — M150 Primary generalized (osteo)arthritis: Secondary | ICD-10-CM | POA: Diagnosis not present

## 2014-08-05 DIAGNOSIS — M0589 Other rheumatoid arthritis with rheumatoid factor of multiple sites: Secondary | ICD-10-CM | POA: Diagnosis not present

## 2014-08-06 ENCOUNTER — Ambulatory Visit: Payer: Medicare Other

## 2014-08-06 ENCOUNTER — Ambulatory Visit (INDEPENDENT_AMBULATORY_CARE_PROVIDER_SITE_OTHER): Payer: Medicare Other | Admitting: Family

## 2014-08-06 DIAGNOSIS — Z5181 Encounter for therapeutic drug level monitoring: Secondary | ICD-10-CM

## 2014-08-06 DIAGNOSIS — I824Y9 Acute embolism and thrombosis of unspecified deep veins of unspecified proximal lower extremity: Secondary | ICD-10-CM

## 2014-08-06 LAB — POCT INR: INR: 2.3

## 2014-08-06 NOTE — Patient Instructions (Signed)
Continue to take 7.5 on Tues and Thursday and 10 mg all other days. Re-check in 4 weeks.  Anticoagulation Dose Instructions as of 08/06/2014     Jaime Kelley Tue Wed Thu Fri Sat   New Dose 10 mg 10 mg 7.5 mg 10 mg 7.5 mg 10 mg 10 mg    Description       Continue to take 7.5 on Tues and Thursday and 10 mg all other days. Re-check in 4 weeks.

## 2014-08-22 ENCOUNTER — Other Ambulatory Visit: Payer: Self-pay | Admitting: Family Medicine

## 2014-08-24 NOTE — Telephone Encounter (Signed)
Can we refill the Metformin & Synthroid? Looks like pt needs labs? Also I will need to forward a note to [facility] for refill on Coumadin.

## 2014-08-24 NOTE — Telephone Encounter (Signed)
Yes refill metformin and synthroid for one year and let [facility] do the coumadin

## 2014-08-25 ENCOUNTER — Telehealth: Payer: Self-pay | Admitting: Family Medicine

## 2014-08-25 NOTE — Telephone Encounter (Signed)
Pt is requesting re-fill on HYDROcodone-acetaminophen (NORCO/VICODIN) 5-325 MG per tablet ° °

## 2014-08-26 MED ORDER — HYDROCODONE-ACETAMINOPHEN 5-325 MG PO TABS: ORAL_TABLET | ORAL | Status: DC

## 2014-08-26 NOTE — Telephone Encounter (Signed)
Script is ready for pick up and I spoke with pt.  

## 2014-08-26 NOTE — Telephone Encounter (Signed)
done

## 2014-08-27 DIAGNOSIS — Z4789 Encounter for other orthopedic aftercare: Secondary | ICD-10-CM | POA: Diagnosis not present

## 2014-08-27 DIAGNOSIS — M1712 Unilateral primary osteoarthritis, left knee: Secondary | ICD-10-CM | POA: Diagnosis not present

## 2014-08-31 ENCOUNTER — Ambulatory Visit (INDEPENDENT_AMBULATORY_CARE_PROVIDER_SITE_OTHER): Payer: Medicare Other | Admitting: Family

## 2014-08-31 DIAGNOSIS — I824Y9 Acute embolism and thrombosis of unspecified deep veins of unspecified proximal lower extremity: Secondary | ICD-10-CM

## 2014-08-31 DIAGNOSIS — Z5181 Encounter for therapeutic drug level monitoring: Secondary | ICD-10-CM | POA: Diagnosis not present

## 2014-08-31 LAB — POCT INR: INR: 2.9

## 2014-08-31 NOTE — Patient Instructions (Signed)
Continue to take 7.5 on Tues and Thursday and 10 mg all other days. Re-check in 4 weeks.  Anticoagulation Dose Instructions as of 08/31/2014      Jaime Kelley Tue Wed Thu Fri Sat   New Dose 10 mg 10 mg 7.5 mg 10 mg 7.5 mg 10 mg 10 mg    Description        Continue to take 7.5 on Tues and Thursday and 10 mg all other days. Re-check in 4 weeks.

## 2014-09-28 ENCOUNTER — Ambulatory Visit: Payer: Medicare Other | Admitting: Family

## 2014-09-29 ENCOUNTER — Other Ambulatory Visit: Payer: Self-pay | Admitting: Family Medicine

## 2014-11-05 DIAGNOSIS — M150 Primary generalized (osteo)arthritis: Secondary | ICD-10-CM | POA: Diagnosis not present

## 2014-11-05 DIAGNOSIS — M0589 Other rheumatoid arthritis with rheumatoid factor of multiple sites: Secondary | ICD-10-CM | POA: Diagnosis not present

## 2014-11-05 DIAGNOSIS — M25562 Pain in left knee: Secondary | ICD-10-CM | POA: Diagnosis not present

## 2014-11-11 ENCOUNTER — Other Ambulatory Visit: Payer: Self-pay | Admitting: Family

## 2014-11-20 ENCOUNTER — Ambulatory Visit (INDEPENDENT_AMBULATORY_CARE_PROVIDER_SITE_OTHER): Payer: Medicare Other | Admitting: Family

## 2014-11-20 DIAGNOSIS — I824Y9 Acute embolism and thrombosis of unspecified deep veins of unspecified proximal lower extremity: Secondary | ICD-10-CM

## 2014-11-20 DIAGNOSIS — Z5181 Encounter for therapeutic drug level monitoring: Secondary | ICD-10-CM

## 2014-11-20 LAB — POCT INR: INR: 2.4

## 2014-11-20 NOTE — Patient Instructions (Signed)
Continue to take 7.5 on Tues and Thursday and 10 mg all other days. Re-check in 6 weeks.  Anticoagulation Dose Instructions as of 11/20/2014      Jaime Kelley Tue Wed Thu Fri Sat   New Dose 10 mg 10 mg 7.5 mg 10 mg 7.5 mg 10 mg 10 mg    Description        Continue to take 7.5 on Tues and Thursday and 10 mg all other days. Re-check in 6 weeks.

## 2014-11-27 ENCOUNTER — Other Ambulatory Visit: Payer: Self-pay | Admitting: Family Medicine

## 2014-12-16 ENCOUNTER — Telehealth: Payer: Self-pay | Admitting: Family Medicine

## 2014-12-16 MED ORDER — HYDROCODONE-ACETAMINOPHEN 5-325 MG PO TABS: ORAL_TABLET | ORAL | Status: DC

## 2014-12-16 NOTE — Telephone Encounter (Signed)
Pt needs new rx hydrocodone °

## 2014-12-16 NOTE — Telephone Encounter (Signed)
done

## 2014-12-17 DIAGNOSIS — M1712 Unilateral primary osteoarthritis, left knee: Secondary | ICD-10-CM | POA: Diagnosis not present

## 2014-12-17 DIAGNOSIS — Z9889 Other specified postprocedural states: Secondary | ICD-10-CM | POA: Diagnosis not present

## 2014-12-17 DIAGNOSIS — Z4789 Encounter for other orthopedic aftercare: Secondary | ICD-10-CM | POA: Diagnosis not present

## 2014-12-17 NOTE — Telephone Encounter (Signed)
Script is ready for pick up and I spoke with pt.  

## 2014-12-27 ENCOUNTER — Other Ambulatory Visit: Payer: Self-pay | Admitting: Family Medicine

## 2014-12-31 ENCOUNTER — Ambulatory Visit (INDEPENDENT_AMBULATORY_CARE_PROVIDER_SITE_OTHER): Payer: Medicare Other | Admitting: General Practice

## 2014-12-31 DIAGNOSIS — Z5181 Encounter for therapeutic drug level monitoring: Secondary | ICD-10-CM | POA: Diagnosis not present

## 2014-12-31 LAB — POCT INR: INR: 2.2

## 2014-12-31 NOTE — Progress Notes (Signed)
Pre visit review using our clinic review tool, if applicable. No additional management support is needed unless otherwise documented below in the visit note. 

## 2015-02-03 ENCOUNTER — Encounter: Payer: Self-pay | Admitting: Family Medicine

## 2015-02-03 ENCOUNTER — Ambulatory Visit (INDEPENDENT_AMBULATORY_CARE_PROVIDER_SITE_OTHER): Payer: Medicare Other | Admitting: Family Medicine

## 2015-02-03 VITALS — BP 142/82 | HR 101 | Temp 98.9°F | Ht 62.0 in | Wt 196.0 lb

## 2015-02-03 DIAGNOSIS — Z86718 Personal history of other venous thrombosis and embolism: Secondary | ICD-10-CM | POA: Diagnosis not present

## 2015-02-03 DIAGNOSIS — E038 Other specified hypothyroidism: Secondary | ICD-10-CM | POA: Diagnosis not present

## 2015-02-03 DIAGNOSIS — Z01818 Encounter for other preprocedural examination: Secondary | ICD-10-CM

## 2015-02-03 DIAGNOSIS — M069 Rheumatoid arthritis, unspecified: Secondary | ICD-10-CM

## 2015-02-03 DIAGNOSIS — J439 Emphysema, unspecified: Secondary | ICD-10-CM

## 2015-02-03 DIAGNOSIS — E119 Type 2 diabetes mellitus without complications: Secondary | ICD-10-CM | POA: Diagnosis not present

## 2015-02-03 LAB — CBC WITH DIFFERENTIAL/PLATELET
BASOS ABS: 0 10*3/uL (ref 0.0–0.1)
Basophils Relative: 0.4 % (ref 0.0–3.0)
EOS ABS: 0.1 10*3/uL (ref 0.0–0.7)
Eosinophils Relative: 0.8 % (ref 0.0–5.0)
HCT: 45 % (ref 36.0–46.0)
Hemoglobin: 15.4 g/dL — ABNORMAL HIGH (ref 12.0–15.0)
Lymphocytes Relative: 43.8 % (ref 12.0–46.0)
Lymphs Abs: 4.2 10*3/uL — ABNORMAL HIGH (ref 0.7–4.0)
MCHC: 34.2 g/dL (ref 30.0–36.0)
MCV: 86.5 fl (ref 78.0–100.0)
MONO ABS: 0.8 10*3/uL (ref 0.1–1.0)
Monocytes Relative: 7.9 % (ref 3.0–12.0)
NEUTROS PCT: 47.1 % (ref 43.0–77.0)
Neutro Abs: 4.5 10*3/uL (ref 1.4–7.7)
Platelets: 394 10*3/uL (ref 150.0–400.0)
RBC: 5.2 Mil/uL — ABNORMAL HIGH (ref 3.87–5.11)
RDW: 16.7 % — ABNORMAL HIGH (ref 11.5–15.5)
WBC: 9.5 10*3/uL (ref 4.0–10.5)

## 2015-02-03 LAB — BASIC METABOLIC PANEL
BUN: 8 mg/dL (ref 6–23)
CHLORIDE: 94 meq/L — AB (ref 96–112)
CO2: 30 meq/L (ref 19–32)
Calcium: 9.8 mg/dL (ref 8.4–10.5)
Creatinine, Ser: 0.81 mg/dL (ref 0.40–1.20)
GFR: 75.55 mL/min (ref >60.00)
GLUCOSE: 92 mg/dL (ref 70–99)
POTASSIUM: 3.8 meq/L (ref 3.5–5.1)
Sodium: 130 mEq/L — ABNORMAL LOW (ref 135–145)

## 2015-02-03 LAB — POCT URINALYSIS DIPSTICK
Bilirubin, UA: NEGATIVE
GLUCOSE UA: NEGATIVE
Ketones, UA: NEGATIVE
Nitrite, UA: NEGATIVE
PROTEIN UA: NEGATIVE
SPEC GRAV UA: 1.015
UROBILINOGEN UA: 0.2
pH, UA: 7

## 2015-02-03 LAB — HEPATIC FUNCTION PANEL
ALK PHOS: 53 U/L (ref 39–117)
ALT: 19 U/L (ref 0–35)
AST: 20 U/L (ref 0–37)
Albumin: 4.2 g/dL (ref 3.5–5.2)
BILIRUBIN TOTAL: 0.3 mg/dL (ref 0.2–1.2)
Bilirubin, Direct: 0 mg/dL (ref 0.0–0.3)
Total Protein: 6.9 g/dL (ref 6.0–8.3)

## 2015-02-03 LAB — TSH: TSH: 0.59 u[IU]/mL (ref 0.35–4.50)

## 2015-02-03 LAB — HEMOGLOBIN A1C: Hgb A1c MFr Bld: 6.2 % (ref 4.6–6.5)

## 2015-02-03 MED ORDER — SULFAMETHOXAZOLE-TRIMETHOPRIM 800-160 MG PO TABS: 1.0000 | ORAL_TABLET | Freq: Two times a day (BID) | ORAL | Status: DC

## 2015-02-03 MED ORDER — PREDNISONE 20 MG PO TABS: 20.0000 mg | ORAL_TABLET | ORAL | Status: DC | PRN

## 2015-02-03 MED ORDER — LORAZEPAM 0.5 MG PO TABS: 0.5000 mg | ORAL_TABLET | Freq: Two times a day (BID) | ORAL | Status: DC | PRN

## 2015-02-03 NOTE — Progress Notes (Signed)
Pre visit review using our clinic review tool, if applicable. No additional management support is needed unless otherwise documented below in the visit note. 

## 2015-02-03 NOTE — Progress Notes (Signed)
   Subjective:    Patient ID: Jaime Kelley, female    DOB: [birth year], 64 y.o.   MRN: [id number]  HPI Here for a preoperative evaluation prior to a left knee TKA with patella resurfacing per Dr. Esmond Plants. This has not been scheduled yet. Other than her knee pain she has been doing well. She continues on rheumatoid arthritis treatments. Her BP is stable. Her COPD is stable.    Review of Systems  Constitutional: Negative.   HENT: Negative.   Eyes: Negative.   Respiratory: Negative.   Cardiovascular: Negative.   Gastrointestinal: Negative.   Endocrine: Negative.   Genitourinary: Negative.  Negative for dysuria, urgency, frequency, hematuria, flank pain, decreased urine volume, enuresis, difficulty urinating, pelvic pain and dyspareunia.  Musculoskeletal: Negative.   Skin: Negative.   Neurological: Negative.   Psychiatric/Behavioral: Negative.        Objective:   Physical Exam  Constitutional: She is oriented to person, place, and time. She appears well-developed and well-nourished. No distress.  HENT:  Head: Normocephalic and atraumatic.  Right Ear: External ear normal.  Left Ear: External ear normal.  Nose: Nose normal.  Mouth/Throat: Oropharynx is clear and moist. No oropharyngeal exudate.  Eyes: Conjunctivae and EOM are normal. Pupils are equal, round, and reactive to light. No scleral icterus.  Neck: Normal range of motion. Neck supple. No JVD present. No thyromegaly present.  Cardiovascular: Normal rate, regular rhythm, normal heart sounds and intact distal pulses.  Exam reveals no gallop and no friction rub.   No murmur heard. EKG normal   Pulmonary/Chest: Effort normal and breath sounds normal. No respiratory distress. She has no wheezes. She has no rales. She exhibits no tenderness.  Abdominal: Soft. Bowel sounds are normal. She exhibits no distension and no mass. There is no tenderness. There is no rebound and no guarding.  Musculoskeletal: Normal range of  motion. She exhibits no edema or tenderness.  Lymphadenopathy:    She has no cervical adenopathy.  Neurological: She is alert and oriented to person, place, and time. She has normal reflexes. No cranial nerve deficit. She exhibits normal muscle tone. Coordination normal.  Skin: Skin is warm and dry. No rash noted. No erythema.  Psychiatric: She has a normal mood and affect. Her behavior is normal. Judgment and thought content normal.          Assessment & Plan:  Unremarkable evaluation. She is cleared for surgery pending labs that are drawn today

## 2015-02-03 NOTE — Addendum Note (Signed)
Addended by: Aggie Hacker A on: 02/03/2015 04:42 PM   Modules accepted: Orders

## 2015-02-04 DIAGNOSIS — M150 Primary generalized (osteo)arthritis: Secondary | ICD-10-CM | POA: Diagnosis not present

## 2015-02-04 DIAGNOSIS — M25562 Pain in left knee: Secondary | ICD-10-CM | POA: Diagnosis not present

## 2015-02-04 DIAGNOSIS — M0589 Other rheumatoid arthritis with rheumatoid factor of multiple sites: Secondary | ICD-10-CM | POA: Diagnosis not present

## 2015-02-11 ENCOUNTER — Other Ambulatory Visit: Payer: Self-pay | Admitting: General Practice

## 2015-02-11 ENCOUNTER — Ambulatory Visit (INDEPENDENT_AMBULATORY_CARE_PROVIDER_SITE_OTHER): Payer: Medicare Other | Admitting: General Practice

## 2015-02-11 DIAGNOSIS — Z5181 Encounter for therapeutic drug level monitoring: Secondary | ICD-10-CM | POA: Diagnosis not present

## 2015-02-11 DIAGNOSIS — Z86718 Personal history of other venous thrombosis and embolism: Secondary | ICD-10-CM

## 2015-02-11 LAB — POCT INR: INR: 3.3

## 2015-02-11 MED ORDER — WARFARIN SODIUM 7.5 MG PO TABS: ORAL_TABLET | ORAL | Status: DC

## 2015-02-11 NOTE — Progress Notes (Signed)
Pre visit review using our clinic review tool, if applicable. No additional management support is needed unless otherwise documented below in the visit note. 

## 2015-03-08 ENCOUNTER — Other Ambulatory Visit: Payer: Self-pay | Admitting: Family Medicine

## 2015-03-11 ENCOUNTER — Ambulatory Visit (INDEPENDENT_AMBULATORY_CARE_PROVIDER_SITE_OTHER): Payer: Medicare Other | Admitting: General Practice

## 2015-03-11 DIAGNOSIS — Z86718 Personal history of other venous thrombosis and embolism: Secondary | ICD-10-CM

## 2015-03-11 DIAGNOSIS — I824Y9 Acute embolism and thrombosis of unspecified deep veins of unspecified proximal lower extremity: Secondary | ICD-10-CM

## 2015-03-11 DIAGNOSIS — Z5181 Encounter for therapeutic drug level monitoring: Secondary | ICD-10-CM | POA: Diagnosis not present

## 2015-03-11 LAB — POCT INR: INR: 3

## 2015-03-11 NOTE — Progress Notes (Signed)
Pre visit review using our clinic review tool, if applicable. No additional management support is needed unless otherwise documented below in the visit note. 

## 2015-03-19 ENCOUNTER — Other Ambulatory Visit: Payer: Self-pay | Admitting: Family Medicine

## 2015-03-19 MED ORDER — HYDROCODONE-ACETAMINOPHEN 5-325 MG PO TABS: ORAL_TABLET | ORAL | Status: DC

## 2015-03-19 NOTE — Telephone Encounter (Signed)
done

## 2015-03-19 NOTE — Telephone Encounter (Signed)
Pt request refill HYDROcodone-acetaminophen (NORCO/VICODIN) 5-325 MG per tablet °

## 2015-03-19 NOTE — Telephone Encounter (Signed)
Called spoke with pt and pt is aware rx is ready for pick up.

## 2015-03-29 ENCOUNTER — Other Ambulatory Visit: Payer: Self-pay | Admitting: Family Medicine

## 2015-04-08 ENCOUNTER — Ambulatory Visit (INDEPENDENT_AMBULATORY_CARE_PROVIDER_SITE_OTHER): Payer: Medicare Other | Admitting: General Practice

## 2015-04-08 DIAGNOSIS — I824Y9 Acute embolism and thrombosis of unspecified deep veins of unspecified proximal lower extremity: Secondary | ICD-10-CM

## 2015-04-08 DIAGNOSIS — Z86718 Personal history of other venous thrombosis and embolism: Secondary | ICD-10-CM | POA: Diagnosis not present

## 2015-04-08 DIAGNOSIS — Z5181 Encounter for therapeutic drug level monitoring: Secondary | ICD-10-CM | POA: Diagnosis not present

## 2015-04-08 LAB — POCT INR: INR: 2.2

## 2015-04-08 NOTE — Progress Notes (Signed)
Pre visit review using our clinic review tool, if applicable. No additional management support is needed unless otherwise documented below in the visit note. 

## 2015-05-06 DIAGNOSIS — M150 Primary generalized (osteo)arthritis: Secondary | ICD-10-CM | POA: Diagnosis not present

## 2015-05-06 DIAGNOSIS — M0589 Other rheumatoid arthritis with rheumatoid factor of multiple sites: Secondary | ICD-10-CM | POA: Diagnosis not present

## 2015-05-06 DIAGNOSIS — M25562 Pain in left knee: Secondary | ICD-10-CM | POA: Diagnosis not present

## 2015-05-06 DIAGNOSIS — Z79899 Other long term (current) drug therapy: Secondary | ICD-10-CM | POA: Diagnosis not present

## 2015-05-07 ENCOUNTER — Other Ambulatory Visit: Payer: Self-pay | Admitting: Family

## 2015-05-07 MED ORDER — WARFARIN SODIUM 7.5 MG PO TABS: ORAL_TABLET | ORAL | Status: DC

## 2015-05-07 NOTE — Telephone Encounter (Signed)
Rx sent in

## 2015-05-20 ENCOUNTER — Ambulatory Visit (INDEPENDENT_AMBULATORY_CARE_PROVIDER_SITE_OTHER): Payer: Medicare Other | Admitting: General Practice

## 2015-05-20 DIAGNOSIS — Z86718 Personal history of other venous thrombosis and embolism: Secondary | ICD-10-CM | POA: Diagnosis not present

## 2015-05-20 DIAGNOSIS — Z5181 Encounter for therapeutic drug level monitoring: Secondary | ICD-10-CM | POA: Diagnosis not present

## 2015-05-20 LAB — POCT INR: INR: 2.2

## 2015-05-20 NOTE — Progress Notes (Signed)
Pre visit review using our clinic review tool, if applicable. No additional management support is needed unless otherwise documented below in the visit note. 

## 2015-06-30 ENCOUNTER — Other Ambulatory Visit: Payer: Self-pay | Admitting: Family Medicine

## 2015-06-30 ENCOUNTER — Telehealth: Payer: Self-pay | Admitting: Family Medicine

## 2015-06-30 NOTE — Telephone Encounter (Signed)
Pt request refill HYDROcodone-acetaminophen (NORCO/VICODIN) 5-325 MG per tablet  Pt states she is coming in for coum clinic at 1pm and would like to pu tomorrow at that appt

## 2015-07-01 ENCOUNTER — Ambulatory Visit (INDEPENDENT_AMBULATORY_CARE_PROVIDER_SITE_OTHER): Payer: Medicare Other | Admitting: General Practice

## 2015-07-01 DIAGNOSIS — Z86718 Personal history of other venous thrombosis and embolism: Secondary | ICD-10-CM | POA: Diagnosis not present

## 2015-07-01 DIAGNOSIS — Z5181 Encounter for therapeutic drug level monitoring: Secondary | ICD-10-CM

## 2015-07-01 LAB — POCT INR: INR: 2.3

## 2015-07-01 MED ORDER — HYDROCODONE-ACETAMINOPHEN 5-325 MG PO TABS: ORAL_TABLET | ORAL | Status: DC

## 2015-07-01 NOTE — Progress Notes (Signed)
Pre visit review using our clinic review tool, if applicable. No additional management support is needed unless otherwise documented below in the visit note. 

## 2015-07-01 NOTE — Telephone Encounter (Signed)
Script is ready for pick up and I left a voice message for pt. 

## 2015-07-01 NOTE — Telephone Encounter (Signed)
done

## 2015-08-12 ENCOUNTER — Other Ambulatory Visit: Payer: Self-pay | Admitting: Family Medicine

## 2015-08-12 ENCOUNTER — Ambulatory Visit (INDEPENDENT_AMBULATORY_CARE_PROVIDER_SITE_OTHER): Payer: Medicare Other | Admitting: General Practice

## 2015-08-12 DIAGNOSIS — Z5181 Encounter for therapeutic drug level monitoring: Secondary | ICD-10-CM | POA: Diagnosis not present

## 2015-08-12 DIAGNOSIS — Z23 Encounter for immunization: Secondary | ICD-10-CM

## 2015-08-12 DIAGNOSIS — Z86718 Personal history of other venous thrombosis and embolism: Secondary | ICD-10-CM

## 2015-08-12 LAB — POCT INR: INR: 2.5

## 2015-08-12 NOTE — Progress Notes (Signed)
Pre visit review using our clinic review tool, if applicable. No additional management support is needed unless otherwise documented below in the visit note. 

## 2015-08-23 ENCOUNTER — Other Ambulatory Visit: Payer: Self-pay | Admitting: Family Medicine

## 2015-09-14 ENCOUNTER — Ambulatory Visit (INDEPENDENT_AMBULATORY_CARE_PROVIDER_SITE_OTHER): Payer: Medicare Other | Admitting: Family Medicine

## 2015-09-14 ENCOUNTER — Encounter: Payer: Self-pay | Admitting: Family Medicine

## 2015-09-14 VITALS — BP 153/94 | HR 88 | Temp 98.8°F | Ht 62.0 in | Wt 190.0 lb

## 2015-09-14 DIAGNOSIS — L930 Discoid lupus erythematosus: Secondary | ICD-10-CM | POA: Diagnosis not present

## 2015-09-14 MED ORDER — HALOBETASOL PROPIONATE 0.05 % EX CREA: TOPICAL_CREAM | Freq: Two times a day (BID) | CUTANEOUS | Status: DC

## 2015-09-14 NOTE — Progress Notes (Signed)
   Subjective:    Patient ID: Jaime Kelley, female    DOB: 14-Aug-1951, 64 y.o.   MRN: [id number]  HPI Here for the appearance 2 weeks ago of red itchy spots on the skin, and these have been rapidly spreading over the hands, arms, legs, trunk, and the face. She has been using Triamcinolone cream wit no benefit. She does have a hx of discoid lupus, but this has not been active for years.    Review of Systems  Constitutional: Negative.   Skin: Positive for rash.       Objective:   Physical Exam  Constitutional: She appears well-developed and well-nourished.  Skin:  Multiple plaques of red scaly skin in above locations           Assessment & Plan:  This is discoid lupus. Refer back to Dr. Allyson Sabal. Try Halobetasol cream.

## 2015-09-14 NOTE — Progress Notes (Signed)
Pre visit review using our clinic review tool, if applicable. No additional management support is needed unless otherwise documented below in the visit note. 

## 2015-09-23 ENCOUNTER — Ambulatory Visit (INDEPENDENT_AMBULATORY_CARE_PROVIDER_SITE_OTHER): Payer: Medicare Other | Admitting: General Practice

## 2015-09-23 ENCOUNTER — Ambulatory Visit: Payer: Medicare Other

## 2015-09-23 DIAGNOSIS — Z86718 Personal history of other venous thrombosis and embolism: Secondary | ICD-10-CM

## 2015-09-23 LAB — POCT INR: INR: 2.5

## 2015-09-23 NOTE — Progress Notes (Signed)
Pre visit review using our clinic review tool, if applicable. No additional management support is needed unless otherwise documented below in the visit note. 

## 2015-10-05 ENCOUNTER — Telehealth: Payer: Self-pay | Admitting: Family Medicine

## 2015-10-05 MED ORDER — HYDROCODONE-ACETAMINOPHEN 5-325 MG PO TABS: ORAL_TABLET | ORAL | Status: DC

## 2015-10-05 NOTE — Telephone Encounter (Signed)
Pt needs new rx hydrocodone °

## 2015-10-05 NOTE — Telephone Encounter (Signed)
done

## 2015-10-06 NOTE — Telephone Encounter (Signed)
Script is ready for pick up and I left a voice message.  

## 2015-10-20 ENCOUNTER — Other Ambulatory Visit: Payer: Self-pay | Admitting: Family Medicine

## 2015-11-02 DIAGNOSIS — L309 Dermatitis, unspecified: Secondary | ICD-10-CM | POA: Diagnosis not present

## 2015-11-02 DIAGNOSIS — L400 Psoriasis vulgaris: Secondary | ICD-10-CM | POA: Diagnosis not present

## 2015-11-04 ENCOUNTER — Ambulatory Visit (INDEPENDENT_AMBULATORY_CARE_PROVIDER_SITE_OTHER): Payer: Medicare Other | Admitting: General Practice

## 2015-11-04 DIAGNOSIS — Z5181 Encounter for therapeutic drug level monitoring: Secondary | ICD-10-CM

## 2015-11-04 DIAGNOSIS — Z86718 Personal history of other venous thrombosis and embolism: Secondary | ICD-10-CM

## 2015-11-04 LAB — POCT INR: INR: 2.9

## 2015-11-04 NOTE — Progress Notes (Signed)
Pre visit review using our clinic review tool, if applicable. No additional management support is needed unless otherwise documented below in the visit note. 

## 2015-11-10 DIAGNOSIS — Z79899 Other long term (current) drug therapy: Secondary | ICD-10-CM | POA: Diagnosis not present

## 2015-11-10 DIAGNOSIS — M25562 Pain in left knee: Secondary | ICD-10-CM | POA: Diagnosis not present

## 2015-11-10 DIAGNOSIS — M150 Primary generalized (osteo)arthritis: Secondary | ICD-10-CM | POA: Diagnosis not present

## 2015-11-10 DIAGNOSIS — M0589 Other rheumatoid arthritis with rheumatoid factor of multiple sites: Secondary | ICD-10-CM | POA: Diagnosis not present

## 2015-11-23 ENCOUNTER — Other Ambulatory Visit: Payer: Self-pay | Admitting: Family Medicine

## 2015-11-30 DIAGNOSIS — Z79899 Other long term (current) drug therapy: Secondary | ICD-10-CM | POA: Diagnosis not present

## 2015-11-30 DIAGNOSIS — L930 Discoid lupus erythematosus: Secondary | ICD-10-CM | POA: Diagnosis not present

## 2015-11-30 DIAGNOSIS — M25562 Pain in left knee: Secondary | ICD-10-CM | POA: Diagnosis not present

## 2015-11-30 DIAGNOSIS — M0589 Other rheumatoid arthritis with rheumatoid factor of multiple sites: Secondary | ICD-10-CM | POA: Diagnosis not present

## 2015-11-30 DIAGNOSIS — M150 Primary generalized (osteo)arthritis: Secondary | ICD-10-CM | POA: Diagnosis not present

## 2015-12-07 DIAGNOSIS — M150 Primary generalized (osteo)arthritis: Secondary | ICD-10-CM | POA: Diagnosis not present

## 2015-12-07 DIAGNOSIS — Z79899 Other long term (current) drug therapy: Secondary | ICD-10-CM | POA: Diagnosis not present

## 2015-12-07 DIAGNOSIS — M0589 Other rheumatoid arthritis with rheumatoid factor of multiple sites: Secondary | ICD-10-CM | POA: Diagnosis not present

## 2015-12-07 DIAGNOSIS — M25562 Pain in left knee: Secondary | ICD-10-CM | POA: Diagnosis not present

## 2015-12-13 ENCOUNTER — Other Ambulatory Visit: Payer: Self-pay | Admitting: Family Medicine

## 2015-12-15 ENCOUNTER — Ambulatory Visit (INDEPENDENT_AMBULATORY_CARE_PROVIDER_SITE_OTHER): Payer: Medicare Other | Admitting: General Practice

## 2015-12-15 DIAGNOSIS — Z5181 Encounter for therapeutic drug level monitoring: Secondary | ICD-10-CM | POA: Diagnosis not present

## 2015-12-15 DIAGNOSIS — Z86718 Personal history of other venous thrombosis and embolism: Secondary | ICD-10-CM | POA: Diagnosis not present

## 2015-12-15 LAB — POCT INR: INR: 3.5

## 2015-12-15 NOTE — Progress Notes (Signed)
Pre visit review using our clinic review tool, if applicable. No additional management support is needed unless otherwise documented below in the visit note. INR is high today.  Patient has been taking prednisone for several days.  Reiterated to patient to make sure she notifies clinic when she starts new medications. Instructed patient to watch for and report any unusual bleeding or bruising.  Patient verbalized understanding.  Re-check in 3 to 4 weeks.

## 2015-12-22 ENCOUNTER — Other Ambulatory Visit: Payer: Self-pay | Admitting: General Practice

## 2015-12-22 ENCOUNTER — Other Ambulatory Visit: Payer: Self-pay | Admitting: Family Medicine

## 2015-12-23 ENCOUNTER — Other Ambulatory Visit: Payer: Self-pay | Admitting: Family Medicine

## 2015-12-28 DIAGNOSIS — Z79899 Other long term (current) drug therapy: Secondary | ICD-10-CM | POA: Diagnosis not present

## 2015-12-28 DIAGNOSIS — M25562 Pain in left knee: Secondary | ICD-10-CM | POA: Diagnosis not present

## 2015-12-28 DIAGNOSIS — M0589 Other rheumatoid arthritis with rheumatoid factor of multiple sites: Secondary | ICD-10-CM | POA: Diagnosis not present

## 2015-12-28 DIAGNOSIS — M150 Primary generalized (osteo)arthritis: Secondary | ICD-10-CM | POA: Diagnosis not present

## 2016-01-11 ENCOUNTER — Telehealth: Payer: Self-pay | Admitting: Family Medicine

## 2016-01-11 MED ORDER — HYDROCODONE-ACETAMINOPHEN 5-325 MG PO TABS: ORAL_TABLET | ORAL | Status: DC

## 2016-01-11 NOTE — Telephone Encounter (Signed)
done

## 2016-01-11 NOTE — Telephone Encounter (Signed)
Pt needs new rx hydrocodone °

## 2016-01-12 ENCOUNTER — Ambulatory Visit (INDEPENDENT_AMBULATORY_CARE_PROVIDER_SITE_OTHER): Payer: Medicare Other | Admitting: General Practice

## 2016-01-12 DIAGNOSIS — Z86718 Personal history of other venous thrombosis and embolism: Secondary | ICD-10-CM | POA: Diagnosis not present

## 2016-01-12 DIAGNOSIS — Z5181 Encounter for therapeutic drug level monitoring: Secondary | ICD-10-CM | POA: Diagnosis not present

## 2016-01-12 LAB — POCT INR: INR: 2.3

## 2016-01-12 NOTE — Progress Notes (Signed)
Pre visit review using our clinic review tool, if applicable. No additional management support is needed unless otherwise documented below in the visit note. 

## 2016-01-17 ENCOUNTER — Other Ambulatory Visit: Payer: Self-pay | Admitting: General Practice

## 2016-01-17 ENCOUNTER — Other Ambulatory Visit: Payer: Self-pay | Admitting: Family Medicine

## 2016-01-17 DIAGNOSIS — E038 Other specified hypothyroidism: Secondary | ICD-10-CM

## 2016-01-17 MED ORDER — SYNTHROID 137 MCG PO TABS: ORAL_TABLET | ORAL | Status: DC

## 2016-01-19 DIAGNOSIS — M25562 Pain in left knee: Secondary | ICD-10-CM | POA: Diagnosis not present

## 2016-01-19 DIAGNOSIS — Z79899 Other long term (current) drug therapy: Secondary | ICD-10-CM | POA: Diagnosis not present

## 2016-01-19 DIAGNOSIS — M0589 Other rheumatoid arthritis with rheumatoid factor of multiple sites: Secondary | ICD-10-CM | POA: Diagnosis not present

## 2016-01-19 DIAGNOSIS — M150 Primary generalized (osteo)arthritis: Secondary | ICD-10-CM | POA: Diagnosis not present

## 2016-01-24 ENCOUNTER — Other Ambulatory Visit: Payer: Self-pay | Admitting: Family Medicine

## 2016-02-09 ENCOUNTER — Ambulatory Visit (INDEPENDENT_AMBULATORY_CARE_PROVIDER_SITE_OTHER): Payer: Medicare Other | Admitting: General Practice

## 2016-02-09 DIAGNOSIS — Z5181 Encounter for therapeutic drug level monitoring: Secondary | ICD-10-CM | POA: Diagnosis not present

## 2016-02-09 DIAGNOSIS — Z86718 Personal history of other venous thrombosis and embolism: Secondary | ICD-10-CM | POA: Diagnosis not present

## 2016-02-09 LAB — POCT INR: INR: 2.8

## 2016-02-09 NOTE — Progress Notes (Signed)
Pre visit review using our clinic review tool, if applicable. No additional management support is needed unless otherwise documented below in the visit note. 

## 2016-02-09 NOTE — Progress Notes (Signed)
I have reviewed and agree with the plan. 

## 2016-02-19 ENCOUNTER — Other Ambulatory Visit: Payer: Self-pay | Admitting: Family Medicine

## 2016-02-23 ENCOUNTER — Other Ambulatory Visit: Payer: Self-pay | Admitting: Family Medicine

## 2016-02-23 ENCOUNTER — Telehealth: Payer: Self-pay | Admitting: Family Medicine

## 2016-02-23 DIAGNOSIS — M0589 Other rheumatoid arthritis with rheumatoid factor of multiple sites: Secondary | ICD-10-CM | POA: Diagnosis not present

## 2016-02-23 DIAGNOSIS — M25562 Pain in left knee: Secondary | ICD-10-CM | POA: Diagnosis not present

## 2016-02-23 DIAGNOSIS — Z79899 Other long term (current) drug therapy: Secondary | ICD-10-CM | POA: Diagnosis not present

## 2016-02-23 DIAGNOSIS — M150 Primary generalized (osteo)arthritis: Secondary | ICD-10-CM | POA: Diagnosis not present

## 2016-02-23 NOTE — Telephone Encounter (Signed)
Pt request refill of the following:  metFORMIN (GLUCOPHAGE) 500 MG tablet  Pt called said she was told by pharmacy she needed an appt before they could refill. Pt said she is out of the med and is not due to see the doctor until July    Phamacy:

## 2016-02-23 NOTE — Telephone Encounter (Signed)
I sent script e-scribe with 1 refill. 

## 2016-03-07 ENCOUNTER — Other Ambulatory Visit: Payer: Self-pay | Admitting: Family Medicine

## 2016-03-08 ENCOUNTER — Ambulatory Visit (INDEPENDENT_AMBULATORY_CARE_PROVIDER_SITE_OTHER): Payer: Medicare Other | Admitting: General Practice

## 2016-03-08 DIAGNOSIS — Z5181 Encounter for therapeutic drug level monitoring: Secondary | ICD-10-CM | POA: Diagnosis not present

## 2016-03-08 DIAGNOSIS — Z86718 Personal history of other venous thrombosis and embolism: Secondary | ICD-10-CM | POA: Diagnosis not present

## 2016-03-08 LAB — POCT INR: INR: 2.3

## 2016-03-08 NOTE — Progress Notes (Signed)
Pre visit review using our clinic review tool, if applicable. No additional management support is needed unless otherwise documented below in the visit note. 

## 2016-03-20 ENCOUNTER — Other Ambulatory Visit: Payer: Self-pay | Admitting: Family Medicine

## 2016-03-21 ENCOUNTER — Other Ambulatory Visit: Payer: Self-pay | Admitting: General Practice

## 2016-03-21 MED ORDER — AMLODIPINE BESYLATE 10 MG PO TABS: ORAL_TABLET | ORAL | Status: DC

## 2016-03-22 DIAGNOSIS — Z79899 Other long term (current) drug therapy: Secondary | ICD-10-CM | POA: Diagnosis not present

## 2016-03-22 DIAGNOSIS — M25562 Pain in left knee: Secondary | ICD-10-CM | POA: Diagnosis not present

## 2016-03-22 DIAGNOSIS — M150 Primary generalized (osteo)arthritis: Secondary | ICD-10-CM | POA: Diagnosis not present

## 2016-03-22 DIAGNOSIS — M0589 Other rheumatoid arthritis with rheumatoid factor of multiple sites: Secondary | ICD-10-CM | POA: Diagnosis not present

## 2016-04-19 ENCOUNTER — Ambulatory Visit (INDEPENDENT_AMBULATORY_CARE_PROVIDER_SITE_OTHER): Payer: Medicare Other | Admitting: General Practice

## 2016-04-19 DIAGNOSIS — Z5181 Encounter for therapeutic drug level monitoring: Secondary | ICD-10-CM

## 2016-04-19 DIAGNOSIS — Z86718 Personal history of other venous thrombosis and embolism: Secondary | ICD-10-CM

## 2016-04-19 LAB — POCT INR: INR: 3

## 2016-04-19 NOTE — Progress Notes (Signed)
Pre visit review using our clinic review tool, if applicable. No additional management support is needed unless otherwise documented below in the visit note. 

## 2016-04-24 ENCOUNTER — Other Ambulatory Visit: Payer: Self-pay | Admitting: Family Medicine

## 2016-04-24 NOTE — Telephone Encounter (Signed)
Refill sent to pharmacy.   

## 2016-05-10 ENCOUNTER — Other Ambulatory Visit: Payer: Self-pay | Admitting: Family Medicine

## 2016-05-10 MED ORDER — HYDROCODONE-ACETAMINOPHEN 5-325 MG PO TABS: ORAL_TABLET | ORAL | Status: DC

## 2016-05-10 MED ORDER — HYDROCODONE-ACETAMINOPHEN 5-325 MG PO TABS
ORAL_TABLET | ORAL | Status: DC
Start: 2016-05-10 — End: 2016-05-10

## 2016-05-10 NOTE — Telephone Encounter (Signed)
Pt request refill  °HYDROcodone-acetaminophen (NORCO/VICODIN) 5-325 MG tablet °

## 2016-05-10 NOTE — Telephone Encounter (Signed)
done

## 2016-05-11 NOTE — Telephone Encounter (Signed)
Called & let patient know rx's are ready for pick up.

## 2016-05-25 ENCOUNTER — Ambulatory Visit (INDEPENDENT_AMBULATORY_CARE_PROVIDER_SITE_OTHER): Payer: Medicare Other | Admitting: Family Medicine

## 2016-05-25 ENCOUNTER — Encounter: Payer: Self-pay | Admitting: Family Medicine

## 2016-05-25 VITALS — BP 138/70 | HR 88 | Temp 98.5°F | Ht 62.0 in | Wt 203.0 lb

## 2016-05-25 DIAGNOSIS — L930 Discoid lupus erythematosus: Secondary | ICD-10-CM | POA: Diagnosis not present

## 2016-05-25 DIAGNOSIS — E118 Type 2 diabetes mellitus with unspecified complications: Secondary | ICD-10-CM

## 2016-05-25 DIAGNOSIS — E039 Hypothyroidism, unspecified: Secondary | ICD-10-CM | POA: Diagnosis not present

## 2016-05-25 DIAGNOSIS — I10 Essential (primary) hypertension: Secondary | ICD-10-CM | POA: Diagnosis not present

## 2016-05-25 DIAGNOSIS — R7989 Other specified abnormal findings of blood chemistry: Secondary | ICD-10-CM

## 2016-05-25 DIAGNOSIS — E119 Type 2 diabetes mellitus without complications: Secondary | ICD-10-CM | POA: Insufficient documentation

## 2016-05-25 DIAGNOSIS — Z807 Family history of other malignant neoplasms of lymphoid, hematopoietic and related tissues: Secondary | ICD-10-CM

## 2016-05-25 LAB — LIPID PANEL
Cholesterol: 275 mg/dL — ABNORMAL HIGH (ref 0–200)
HDL: 49.6 mg/dL (ref >39.00)
NonHDL: 224.9
Total CHOL/HDL Ratio: 6
Triglycerides: 246 mg/dL — ABNORMAL HIGH (ref 0.0–149.0)
VLDL: 49.2 mg/dL — ABNORMAL HIGH (ref 0.0–40.0)

## 2016-05-25 LAB — BASIC METABOLIC PANEL
BUN: 11 mg/dL (ref 6–23)
CHLORIDE: 93 meq/L — AB (ref 96–112)
CO2: 32 meq/L (ref 19–32)
Calcium: 9.7 mg/dL (ref 8.4–10.5)
Creatinine, Ser: 0.78 mg/dL (ref 0.40–1.20)
GFR: 78.6 mL/min (ref >60.00)
GLUCOSE: 99 mg/dL (ref 70–99)
POTASSIUM: 3.8 meq/L (ref 3.5–5.1)
SODIUM: 132 meq/L — AB (ref 135–145)

## 2016-05-25 LAB — LDL CHOLESTEROL, DIRECT: LDL DIRECT: 183 mg/dL

## 2016-05-25 LAB — TSH: TSH: 0.86 u[IU]/mL (ref 0.35–4.50)

## 2016-05-25 LAB — HEMOGLOBIN A1C: HEMOGLOBIN A1C: 5.9 % (ref 4.6–6.5)

## 2016-05-25 MED ORDER — METFORMIN HCL 500 MG PO TABS: ORAL_TABLET | ORAL | 3 refills | Status: DC

## 2016-05-25 MED ORDER — AMLODIPINE BESYLATE 10 MG PO TABS: ORAL_TABLET | ORAL | 3 refills | Status: DC

## 2016-05-25 MED ORDER — SYNTHROID 137 MCG PO TABS: ORAL_TABLET | ORAL | 3 refills | Status: DC

## 2016-05-25 NOTE — Progress Notes (Signed)
Pre visit review using our clinic review tool, if applicable. No additional management support is needed unless otherwise documented below in the visit note. 

## 2016-05-25 NOTE — Progress Notes (Signed)
   Subjective:    Patient ID: Jaime Kelley, female    DOB: October 13, 1951, 65 y.o.   MRN: [id number]  HPI Here to follow up on several issues. Her am fasting glucoses at home are steady in te range of 98-110. Her BO is stable. She feels well in general. Dr. Dossie Der put her on Plaquenil briefly this spring but she is off of this now.    Review of Systems  Constitutional: Negative.   Respiratory: Negative.   Cardiovascular: Negative.   Gastrointestinal: Negative.   Musculoskeletal: Positive for arthralgias, back pain and gait problem.       Objective:   Physical Exam  Constitutional: She is oriented to person, place, and time. She appears well-developed and well-nourished.  Walks with her walker   Neck: No thyromegaly present.  Cardiovascular: Normal rate, regular rhythm, normal heart sounds and intact distal pulses.   Pulmonary/Chest: Effort normal and breath sounds normal.  Lymphadenopathy:    She has no cervical adenopathy.  Neurological: She is alert and oriented to person, place, and time.          Assessment & Plan:  Her HTN is stable. We will get fasting labs to day to follow her lipids, her thyroid, and her diabetes.  Laurey Morale, MD

## 2016-05-26 LAB — CA 125: CA 125: 12 U/mL (ref <35)

## 2016-05-26 LAB — CEA: CEA: 5.9 ng/mL — AB

## 2016-05-31 ENCOUNTER — Ambulatory Visit (INDEPENDENT_AMBULATORY_CARE_PROVIDER_SITE_OTHER): Payer: Medicare Other | Admitting: General Practice

## 2016-05-31 DIAGNOSIS — Z86718 Personal history of other venous thrombosis and embolism: Secondary | ICD-10-CM

## 2016-05-31 DIAGNOSIS — Z5181 Encounter for therapeutic drug level monitoring: Secondary | ICD-10-CM

## 2016-05-31 LAB — POCT INR: INR: 3.5

## 2016-06-11 ENCOUNTER — Other Ambulatory Visit: Payer: Self-pay | Admitting: Family Medicine

## 2016-06-21 DIAGNOSIS — M25562 Pain in left knee: Secondary | ICD-10-CM | POA: Diagnosis not present

## 2016-06-21 DIAGNOSIS — M150 Primary generalized (osteo)arthritis: Secondary | ICD-10-CM | POA: Diagnosis not present

## 2016-06-21 DIAGNOSIS — M0589 Other rheumatoid arthritis with rheumatoid factor of multiple sites: Secondary | ICD-10-CM | POA: Diagnosis not present

## 2016-06-21 DIAGNOSIS — Z79899 Other long term (current) drug therapy: Secondary | ICD-10-CM | POA: Diagnosis not present

## 2016-06-23 ENCOUNTER — Other Ambulatory Visit: Payer: Self-pay

## 2016-06-26 ENCOUNTER — Other Ambulatory Visit: Payer: Self-pay | Admitting: Family Medicine

## 2016-06-27 ENCOUNTER — Other Ambulatory Visit: Payer: Self-pay | Admitting: General Practice

## 2016-06-27 MED ORDER — WARFARIN SODIUM 10 MG PO TABS: ORAL_TABLET | ORAL | 3 refills | Status: DC

## 2016-06-28 ENCOUNTER — Ambulatory Visit (INDEPENDENT_AMBULATORY_CARE_PROVIDER_SITE_OTHER): Payer: Medicare Other | Admitting: General Practice

## 2016-06-28 DIAGNOSIS — Z86718 Personal history of other venous thrombosis and embolism: Secondary | ICD-10-CM | POA: Diagnosis not present

## 2016-06-28 DIAGNOSIS — Z5181 Encounter for therapeutic drug level monitoring: Secondary | ICD-10-CM

## 2016-06-28 LAB — POCT INR: INR: 3.5

## 2016-07-24 ENCOUNTER — Other Ambulatory Visit: Payer: Self-pay | Admitting: Family Medicine

## 2016-07-26 ENCOUNTER — Ambulatory Visit (INDEPENDENT_AMBULATORY_CARE_PROVIDER_SITE_OTHER): Payer: Medicare Other | Admitting: General Practice

## 2016-07-26 DIAGNOSIS — Z86718 Personal history of other venous thrombosis and embolism: Secondary | ICD-10-CM

## 2016-07-26 DIAGNOSIS — Z23 Encounter for immunization: Secondary | ICD-10-CM | POA: Diagnosis not present

## 2016-07-26 DIAGNOSIS — Z5181 Encounter for therapeutic drug level monitoring: Secondary | ICD-10-CM

## 2016-07-26 LAB — POCT INR: INR: 3

## 2016-08-23 ENCOUNTER — Other Ambulatory Visit: Payer: Self-pay | Admitting: Family Medicine

## 2016-08-23 ENCOUNTER — Ambulatory Visit (INDEPENDENT_AMBULATORY_CARE_PROVIDER_SITE_OTHER): Payer: Medicare Other | Admitting: General Practice

## 2016-08-23 ENCOUNTER — Telehealth: Payer: Self-pay | Admitting: Family Medicine

## 2016-08-23 DIAGNOSIS — Z86718 Personal history of other venous thrombosis and embolism: Secondary | ICD-10-CM

## 2016-08-23 DIAGNOSIS — Z5181 Encounter for therapeutic drug level monitoring: Secondary | ICD-10-CM

## 2016-08-23 LAB — POCT INR: INR: 2.8

## 2016-08-23 MED ORDER — HYDROCODONE-ACETAMINOPHEN 5-325 MG PO TABS: ORAL_TABLET | ORAL | 0 refills | Status: DC

## 2016-08-23 NOTE — Patient Instructions (Signed)
Pre visit review using our clinic review tool, if applicable. No additional management support is needed unless otherwise documented below in the visit note. 

## 2016-08-23 NOTE — Telephone Encounter (Signed)
Done

## 2016-08-23 NOTE — Telephone Encounter (Signed)
Pt need new Rx for Hydrocodone   Pt is aware of the 3 business day protocol

## 2016-08-24 NOTE — Telephone Encounter (Signed)
Script is ready for pick up here at front office and I spoke with Jaime Kelley.  

## 2016-09-20 DIAGNOSIS — M0589 Other rheumatoid arthritis with rheumatoid factor of multiple sites: Secondary | ICD-10-CM | POA: Diagnosis not present

## 2016-09-21 ENCOUNTER — Other Ambulatory Visit: Payer: Self-pay | Admitting: Family Medicine

## 2016-09-24 ENCOUNTER — Other Ambulatory Visit: Payer: Self-pay | Admitting: Family Medicine

## 2016-09-26 MED ORDER — HYDROCODONE-ACETAMINOPHEN 5-325 MG PO TABS: ORAL_TABLET | ORAL | 0 refills | Status: DC

## 2016-09-26 NOTE — Telephone Encounter (Signed)
done

## 2016-09-27 NOTE — Telephone Encounter (Signed)
Script is ready for pick up here at front office and I spoke with pt.  

## 2016-10-04 ENCOUNTER — Ambulatory Visit (INDEPENDENT_AMBULATORY_CARE_PROVIDER_SITE_OTHER): Payer: Medicare Other | Admitting: General Practice

## 2016-10-04 DIAGNOSIS — Z86718 Personal history of other venous thrombosis and embolism: Secondary | ICD-10-CM | POA: Diagnosis not present

## 2016-10-04 DIAGNOSIS — Z5181 Encounter for therapeutic drug level monitoring: Secondary | ICD-10-CM

## 2016-10-04 LAB — POCT INR: INR: 1.7

## 2016-10-04 NOTE — Patient Instructions (Signed)
Pre visit review using our clinic review tool, if applicable. No additional management support is needed unless otherwise documented below in the visit note. 

## 2016-10-30 ENCOUNTER — Telehealth: Payer: Self-pay | Admitting: Family Medicine

## 2016-10-30 MED ORDER — HYDROCODONE-ACETAMINOPHEN 5-325 MG PO TABS: ORAL_TABLET | ORAL | 0 refills | Status: DC

## 2016-10-30 NOTE — Telephone Encounter (Signed)
Pt request refill  HYDROcodone-acetaminophen (NORCO/VICODIN) 5-325 MG tablet  Pt is out and hopes to get today before the bad weather\  Pt is aware of refill policy

## 2016-10-30 NOTE — Telephone Encounter (Signed)
done

## 2016-10-30 NOTE — Telephone Encounter (Signed)
Script is ready for pick up here at front office and I spoke with pt.  

## 2016-11-01 ENCOUNTER — Ambulatory Visit (INDEPENDENT_AMBULATORY_CARE_PROVIDER_SITE_OTHER): Payer: Medicare Other | Admitting: General Practice

## 2016-11-01 DIAGNOSIS — Z86718 Personal history of other venous thrombosis and embolism: Secondary | ICD-10-CM

## 2016-11-01 DIAGNOSIS — Z5181 Encounter for therapeutic drug level monitoring: Secondary | ICD-10-CM

## 2016-11-01 LAB — POCT INR: INR: 2.6

## 2016-11-01 NOTE — Patient Instructions (Signed)
Pre visit review using our clinic review tool, if applicable. No additional management support is needed unless otherwise documented below in the visit note. 

## 2016-11-22 ENCOUNTER — Telehealth: Payer: Self-pay | Admitting: Family Medicine

## 2016-11-22 ENCOUNTER — Other Ambulatory Visit: Payer: Self-pay | Admitting: General Practice

## 2016-11-22 MED ORDER — WARFARIN SODIUM 7.5 MG PO TABS: ORAL_TABLET | ORAL | 1 refills | Status: DC

## 2016-11-22 NOTE — Telephone Encounter (Signed)
Pt state she is having a problem with her Rx and would like to have a call back today.  Pt state that she tried to call you back on 11/21/16 and go the run around.

## 2016-11-28 ENCOUNTER — Telehealth: Payer: Self-pay | Admitting: Family Medicine

## 2016-11-28 MED ORDER — HYDROCODONE-ACETAMINOPHEN 5-325 MG PO TABS: ORAL_TABLET | ORAL | 0 refills | Status: DC

## 2016-11-28 NOTE — Telephone Encounter (Signed)
done

## 2016-11-28 NOTE — Telephone Encounter (Signed)
° ° ° ° ° °  Pt request refill of the following: ° °HYDROcodone-acetaminophen (NORCO/VICODIN) 5-325 MG tablet ° ° °Phamacy: °

## 2016-11-29 ENCOUNTER — Ambulatory Visit (INDEPENDENT_AMBULATORY_CARE_PROVIDER_SITE_OTHER): Payer: Medicare Other | Admitting: General Practice

## 2016-11-29 DIAGNOSIS — Z5181 Encounter for therapeutic drug level monitoring: Secondary | ICD-10-CM

## 2016-11-29 DIAGNOSIS — Z86718 Personal history of other venous thrombosis and embolism: Secondary | ICD-10-CM

## 2016-11-29 LAB — POCT INR: INR: 3.8

## 2016-11-29 NOTE — Patient Instructions (Signed)
Pre visit review using our clinic review tool, if applicable. No additional management support is needed unless otherwise documented below in the visit note. 

## 2016-11-29 NOTE — Telephone Encounter (Signed)
Script is ready for pick up here at front office and I spoke with pt.  

## 2016-12-11 ENCOUNTER — Other Ambulatory Visit: Payer: Self-pay | Admitting: Family Medicine

## 2016-12-20 DIAGNOSIS — Z79899 Other long term (current) drug therapy: Secondary | ICD-10-CM | POA: Diagnosis not present

## 2016-12-20 DIAGNOSIS — M0589 Other rheumatoid arthritis with rheumatoid factor of multiple sites: Secondary | ICD-10-CM | POA: Diagnosis not present

## 2016-12-20 DIAGNOSIS — M150 Primary generalized (osteo)arthritis: Secondary | ICD-10-CM | POA: Diagnosis not present

## 2016-12-20 DIAGNOSIS — M25551 Pain in right hip: Secondary | ICD-10-CM | POA: Diagnosis not present

## 2016-12-20 DIAGNOSIS — M25559 Pain in unspecified hip: Secondary | ICD-10-CM | POA: Diagnosis not present

## 2016-12-21 ENCOUNTER — Other Ambulatory Visit: Payer: Self-pay | Admitting: General Practice

## 2016-12-21 MED ORDER — WARFARIN SODIUM 10 MG PO TABS: ORAL_TABLET | ORAL | 2 refills | Status: DC

## 2016-12-26 ENCOUNTER — Telehealth: Payer: Self-pay | Admitting: Family Medicine

## 2016-12-26 MED ORDER — HYDROCODONE-ACETAMINOPHEN 5-325 MG PO TABS: ORAL_TABLET | ORAL | 0 refills | Status: DC

## 2016-12-26 NOTE — Telephone Encounter (Signed)
Pt request refill  °HYDROcodone-acetaminophen (NORCO/VICODIN) 5-325 MG tablet °

## 2016-12-26 NOTE — Telephone Encounter (Signed)
done

## 2016-12-27 ENCOUNTER — Ambulatory Visit: Payer: Medicare Other | Admitting: General Practice

## 2016-12-27 DIAGNOSIS — Z86718 Personal history of other venous thrombosis and embolism: Secondary | ICD-10-CM

## 2016-12-27 DIAGNOSIS — Z5181 Encounter for therapeutic drug level monitoring: Secondary | ICD-10-CM

## 2016-12-27 LAB — POCT INR: INR: 2.9

## 2016-12-28 NOTE — Telephone Encounter (Signed)
Script is ready for pick up here at front office and I spoke with pt.  

## 2017-01-12 ENCOUNTER — Telehealth: Payer: Self-pay | Admitting: Family Medicine

## 2017-01-12 MED ORDER — AZITHROMYCIN 250 MG PO TABS: ORAL_TABLET | ORAL | 0 refills | Status: DC

## 2017-01-12 NOTE — Telephone Encounter (Signed)
She has had low grade fevers and a dry cough for one week. Drinking fluids. We called in a Zpack.

## 2017-01-17 ENCOUNTER — Telehealth: Payer: Self-pay | Admitting: Family Medicine

## 2017-01-17 NOTE — Telephone Encounter (Signed)
FYI pt state that she is not feeling any better she had finished her Zpac on yesterday and is feeling worse.  Pt state that she think it might be pneumonia she feels the same as she did when she had pneumonia before.  She will be going to check in to [facility].

## 2017-01-17 NOTE — Telephone Encounter (Signed)
She obviously needs to be examined by someone. Either we can see her tomorrow or else she can go to Urgent Care (where she could have a CXR performed)

## 2017-01-17 NOTE — Telephone Encounter (Signed)
I called to check on pt, no answer. I left a voice message for pt to return our call, did not see where she went to any [facility] facility for treatment.

## 2017-01-18 ENCOUNTER — Emergency Department (HOSPITAL_COMMUNITY): Payer: Medicare Other

## 2017-01-18 ENCOUNTER — Encounter (HOSPITAL_COMMUNITY): Payer: Self-pay | Admitting: Emergency Medicine

## 2017-01-18 ENCOUNTER — Inpatient Hospital Stay (HOSPITAL_COMMUNITY)
Admission: EM | Admit: 2017-01-18 | Discharge: 2017-01-21 | DRG: 190 | Disposition: A | Discharge status: Home Health | Payer: Medicare Other | Attending: Family Medicine | Admitting: Family Medicine

## 2017-01-18 DIAGNOSIS — E669 Obesity, unspecified: Secondary | ICD-10-CM | POA: Diagnosis present

## 2017-01-18 DIAGNOSIS — Z7984 Long term (current) use of oral hypoglycemic drugs: Secondary | ICD-10-CM

## 2017-01-18 DIAGNOSIS — Z7901 Long term (current) use of anticoagulants: Secondary | ICD-10-CM

## 2017-01-18 DIAGNOSIS — F17200 Nicotine dependence, unspecified, uncomplicated: Secondary | ICD-10-CM

## 2017-01-18 DIAGNOSIS — K219 Gastro-esophageal reflux disease without esophagitis: Secondary | ICD-10-CM | POA: Diagnosis present

## 2017-01-18 DIAGNOSIS — R0902 Hypoxemia: Secondary | ICD-10-CM | POA: Diagnosis not present

## 2017-01-18 DIAGNOSIS — E871 Hypo-osmolality and hyponatremia: Secondary | ICD-10-CM | POA: Diagnosis present

## 2017-01-18 DIAGNOSIS — Z791 Long term (current) use of non-steroidal anti-inflammatories (NSAID): Secondary | ICD-10-CM

## 2017-01-18 DIAGNOSIS — Z6836 Body mass index (BMI) 36.0-36.9, adult: Secondary | ICD-10-CM | POA: Diagnosis not present

## 2017-01-18 DIAGNOSIS — T502X5A Adverse effect of carbonic-anhydrase inhibitors, benzothiadiazides and other diuretics, initial encounter: Secondary | ICD-10-CM | POA: Diagnosis present

## 2017-01-18 DIAGNOSIS — J441 Chronic obstructive pulmonary disease with (acute) exacerbation: Secondary | ICD-10-CM | POA: Diagnosis not present

## 2017-01-18 DIAGNOSIS — Z79899 Other long term (current) drug therapy: Secondary | ICD-10-CM

## 2017-01-18 DIAGNOSIS — I959 Hypotension, unspecified: Secondary | ICD-10-CM | POA: Diagnosis present

## 2017-01-18 DIAGNOSIS — Z66 Do not resuscitate: Secondary | ICD-10-CM | POA: Diagnosis present

## 2017-01-18 DIAGNOSIS — E118 Type 2 diabetes mellitus with unspecified complications: Secondary | ICD-10-CM | POA: Diagnosis not present

## 2017-01-18 DIAGNOSIS — R0602 Shortness of breath: Secondary | ICD-10-CM | POA: Diagnosis not present

## 2017-01-18 DIAGNOSIS — E039 Hypothyroidism, unspecified: Secondary | ICD-10-CM | POA: Diagnosis present

## 2017-01-18 DIAGNOSIS — L930 Discoid lupus erythematosus: Secondary | ICD-10-CM | POA: Diagnosis present

## 2017-01-18 DIAGNOSIS — E876 Hypokalemia: Secondary | ICD-10-CM | POA: Diagnosis present

## 2017-01-18 DIAGNOSIS — Z86010 Personal history of colonic polyps: Secondary | ICD-10-CM | POA: Diagnosis not present

## 2017-01-18 DIAGNOSIS — I10 Essential (primary) hypertension: Secondary | ICD-10-CM | POA: Diagnosis present

## 2017-01-18 DIAGNOSIS — F17210 Nicotine dependence, cigarettes, uncomplicated: Secondary | ICD-10-CM | POA: Diagnosis present

## 2017-01-18 DIAGNOSIS — M069 Rheumatoid arthritis, unspecified: Secondary | ICD-10-CM | POA: Diagnosis present

## 2017-01-18 DIAGNOSIS — J9601 Acute respiratory failure with hypoxia: Secondary | ICD-10-CM | POA: Diagnosis not present

## 2017-01-18 DIAGNOSIS — J449 Chronic obstructive pulmonary disease, unspecified: Secondary | ICD-10-CM | POA: Diagnosis present

## 2017-01-18 DIAGNOSIS — Z7952 Long term (current) use of systemic steroids: Secondary | ICD-10-CM | POA: Diagnosis not present

## 2017-01-18 DIAGNOSIS — Z86711 Personal history of pulmonary embolism: Secondary | ICD-10-CM | POA: Diagnosis not present

## 2017-01-18 DIAGNOSIS — E119 Type 2 diabetes mellitus without complications: Secondary | ICD-10-CM

## 2017-01-18 LAB — CBC WITH DIFFERENTIAL/PLATELET
Basophils Absolute: 0 10*3/uL (ref 0.0–0.1)
Basophils Relative: 0 %
EOS ABS: 0 10*3/uL (ref 0.0–0.7)
Eosinophils Relative: 0 %
HCT: 26.6 % — ABNORMAL LOW (ref 36.0–46.0)
HEMOGLOBIN: 9.2 g/dL — AB (ref 12.0–15.0)
LYMPHS ABS: 1.2 10*3/uL (ref 0.7–4.0)
LYMPHS PCT: 23 %
MCH: 30.1 pg (ref 26.0–34.0)
MCHC: 34.6 g/dL (ref 30.0–36.0)
MCV: 86.9 fL (ref 78.0–100.0)
MONOS PCT: 8 %
Monocytes Absolute: 0.4 10*3/uL (ref 0.1–1.0)
Neutro Abs: 3.6 10*3/uL (ref 1.7–7.7)
Neutrophils Relative %: 69 %
PLATELETS: 163 10*3/uL (ref 150–400)
RBC: 3.06 MIL/uL — ABNORMAL LOW (ref 3.87–5.11)
RDW: 15.3 % (ref 11.5–15.5)
WBC: 5.3 10*3/uL (ref 4.0–10.5)

## 2017-01-18 LAB — BLOOD GAS, ARTERIAL
Acid-Base Excess: 7.2 mmol/L — ABNORMAL HIGH (ref 0.0–2.0)
Bicarbonate: 31.3 mmol/L — ABNORMAL HIGH (ref 20.0–28.0)
O2 Content: 4 L/min
O2 SAT: 90.8 %
PCO2 ART: 43 mmHg (ref 32.0–48.0)
PH ART: 7.475 — AB (ref 7.350–7.450)
Patient temperature: 98.6
pO2, Arterial: 59.6 mmHg — ABNORMAL LOW (ref 83.0–108.0)

## 2017-01-18 LAB — COMPREHENSIVE METABOLIC PANEL
ALBUMIN: 3.4 g/dL — AB (ref 3.5–5.0)
ALT: 17 U/L (ref 14–54)
ANION GAP: 9 (ref 5–15)
AST: 21 U/L (ref 15–41)
Alkaline Phosphatase: 52 U/L (ref 38–126)
BILIRUBIN TOTAL: 0.4 mg/dL (ref 0.3–1.2)
BUN: 17 mg/dL (ref 6–20)
CO2: 31 mmol/L (ref 22–32)
Calcium: 8.2 mg/dL — ABNORMAL LOW (ref 8.9–10.3)
Chloride: 88 mmol/L — ABNORMAL LOW (ref 101–111)
Creatinine, Ser: 0.93 mg/dL (ref 0.44–1.00)
GFR calc non Af Amer: >60 mL/min (ref >60)
GLUCOSE: 130 mg/dL — AB (ref 65–99)
POTASSIUM: 2.6 mmol/L — AB (ref 3.5–5.1)
Sodium: 128 mmol/L — ABNORMAL LOW (ref 135–145)
TOTAL PROTEIN: 6.6 g/dL (ref 6.5–8.1)

## 2017-01-18 LAB — I-STAT TROPONIN, ED: Troponin i, poc: 0 ng/mL (ref 0.00–0.08)

## 2017-01-18 LAB — I-STAT CHEM 8, ED
BUN: 16 mg/dL (ref 6–20)
CALCIUM ION: 0.88 mmol/L — AB (ref 1.15–1.40)
Chloride: 85 mmol/L — ABNORMAL LOW (ref 101–111)
Creatinine, Ser: 0.9 mg/dL (ref 0.44–1.00)
Glucose, Bld: 130 mg/dL — ABNORMAL HIGH (ref 65–99)
HCT: 44 % (ref 36.0–46.0)
Hemoglobin: 15 g/dL (ref 12.0–15.0)
Potassium: 2.6 mmol/L — CL (ref 3.5–5.1)
SODIUM: 127 mmol/L — AB (ref 135–145)
TCO2: 31 mmol/L (ref 0–100)

## 2017-01-18 LAB — INFLUENZA PANEL BY PCR (TYPE A & B)
INFLAPCR: NEGATIVE
Influenza B By PCR: NEGATIVE

## 2017-01-18 LAB — GLUCOSE, CAPILLARY: GLUCOSE-CAPILLARY: 229 mg/dL — AB (ref 65–99)

## 2017-01-18 LAB — PROTIME-INR
INR: 5.82 — AB
INR: 5.74
PROTHROMBIN TIME: 54 s — AB (ref 11.4–15.2)
Prothrombin Time: 53.5 seconds — ABNORMAL HIGH (ref 11.4–15.2)

## 2017-01-18 LAB — I-STAT CG4 LACTIC ACID, ED: Lactic Acid, Venous: 0.74 mmol/L (ref 0.5–1.9)

## 2017-01-18 MED ORDER — IPRATROPIUM-ALBUTEROL 0.5-2.5 (3) MG/3ML IN SOLN
3.0000 mL | Freq: Four times a day (QID) | RESPIRATORY_TRACT | Status: DC
  Administered 2017-01-18 – 2017-01-20 (×6): 3 mL via RESPIRATORY_TRACT
  Filled 2017-01-18 (×6): qty 3

## 2017-01-18 MED ORDER — ONDANSETRON HCL 4 MG/2ML IJ SOLN: 4.0000 mg | Freq: Four times a day (QID) | INTRAMUSCULAR | Status: DC | PRN

## 2017-01-18 MED ORDER — SODIUM CHLORIDE 0.9 % IV BOLUS (SEPSIS): 1000.0000 mL | Freq: Once | INTRAVENOUS | Status: DC

## 2017-01-18 MED ORDER — METHYLPREDNISOLONE SODIUM SUCC 40 MG IJ SOLR
40.0000 mg | Freq: Two times a day (BID) | INTRAMUSCULAR | Status: DC
  Administered 2017-01-19 – 2017-01-21 (×6): 40 mg via INTRAVENOUS
  Filled 2017-01-18 (×7): qty 1

## 2017-01-18 MED ORDER — SODIUM CHLORIDE 0.9% FLUSH: 3.0000 mL | INTRAVENOUS | Status: DC | PRN

## 2017-01-18 MED ORDER — WARFARIN - PHARMACIST DOSING INPATIENT: Freq: Every day | Status: DC

## 2017-01-18 MED ORDER — ORAL CARE MOUTH RINSE
15.0000 mL | Freq: Two times a day (BID) | OROMUCOSAL | Status: DC
  Administered 2017-01-20 – 2017-01-21 (×2): 15 mL via OROMUCOSAL

## 2017-01-18 MED ORDER — SODIUM CHLORIDE 0.9 % IV BOLUS (SEPSIS)
500.0000 mL | Freq: Once | INTRAVENOUS | Status: AC
  Administered 2017-01-18: 500 mL via INTRAVENOUS

## 2017-01-18 MED ORDER — AZITHROMYCIN 500 MG IV SOLR
500.0000 mg | Freq: Once | INTRAVENOUS | Status: AC
  Administered 2017-01-18: 500 mg via INTRAVENOUS
  Filled 2017-01-18: qty 500

## 2017-01-18 MED ORDER — SODIUM CHLORIDE 0.9 % IV SOLN: 30.0000 meq | Freq: Once | INTRAVENOUS | Status: DC

## 2017-01-18 MED ORDER — SODIUM CHLORIDE 0.9% FLUSH
3.0000 mL | Freq: Two times a day (BID) | INTRAVENOUS | Status: DC
  Administered 2017-01-18 – 2017-01-21 (×6): 3 mL via INTRAVENOUS

## 2017-01-18 MED ORDER — POTASSIUM CHLORIDE CRYS ER 20 MEQ PO TBCR
40.0000 meq | EXTENDED_RELEASE_TABLET | ORAL | Status: AC
  Administered 2017-01-18 (×2): 40 meq via ORAL
  Filled 2017-01-18 (×2): qty 2

## 2017-01-18 MED ORDER — GLUCERNA SHAKE PO LIQD
237.0000 mL | Freq: Three times a day (TID) | ORAL | Status: DC
  Filled 2017-01-18 (×3): qty 237

## 2017-01-18 MED ORDER — POTASSIUM CHLORIDE 10 MEQ/100ML IV SOLN
10.0000 meq | INTRAVENOUS | Status: AC
  Administered 2017-01-18 (×2): 10 meq via INTRAVENOUS
  Filled 2017-01-18 (×3): qty 100

## 2017-01-18 MED ORDER — INSULIN ASPART 100 UNIT/ML ~~LOC~~ SOLN
0.0000 [IU] | Freq: Three times a day (TID) | SUBCUTANEOUS | Status: DC
Start: 2017-01-19 — End: 2017-01-21
  Administered 2017-01-19 – 2017-01-21 (×4): 2 [IU] via SUBCUTANEOUS

## 2017-01-18 MED ORDER — DOXYCYCLINE HYCLATE 100 MG PO TABS
100.0000 mg | ORAL_TABLET | Freq: Two times a day (BID) | ORAL | Status: DC
  Administered 2017-01-18 – 2017-01-21 (×6): 100 mg via ORAL
  Filled 2017-01-18 (×6): qty 1

## 2017-01-18 MED ORDER — ACETAMINOPHEN 650 MG RE SUPP
650.0000 mg | Freq: Four times a day (QID) | RECTAL | Status: DC | PRN
Start: 2017-01-18 — End: 2017-01-21

## 2017-01-18 MED ORDER — NICOTINE 21 MG/24HR TD PT24
21.0000 mg | MEDICATED_PATCH | Freq: Every day | TRANSDERMAL | Status: DC
  Administered 2017-01-18 – 2017-01-21 (×4): 21 mg via TRANSDERMAL
  Filled 2017-01-18 (×4): qty 1

## 2017-01-18 MED ORDER — ENSURE ENLIVE PO LIQD: 237.0000 mL | Freq: Two times a day (BID) | ORAL | Status: DC

## 2017-01-18 MED ORDER — INSULIN ASPART 100 UNIT/ML ~~LOC~~ SOLN
0.0000 [IU] | Freq: Every day | SUBCUTANEOUS | Status: DC
  Administered 2017-01-18: 2 [IU] via SUBCUTANEOUS

## 2017-01-18 MED ORDER — SODIUM CHLORIDE 0.9 % IV SOLN
INTRAVENOUS | Status: DC
Start: 2017-01-18 — End: 2017-01-21
  Administered 2017-01-18: 21:00:00 via INTRAVENOUS
  Administered 2017-01-20: 100 mL via INTRAVENOUS
  Administered 2017-01-21: 02:00:00 via INTRAVENOUS

## 2017-01-18 MED ORDER — SODIUM CHLORIDE 0.9 % IV SOLN: 250.0000 mL | INTRAVENOUS | Status: DC | PRN

## 2017-01-18 MED ORDER — ENOXAPARIN SODIUM 40 MG/0.4ML ~~LOC~~ SOLN: 40.0000 mg | SUBCUTANEOUS | Status: DC

## 2017-01-18 MED ORDER — IPRATROPIUM-ALBUTEROL 0.5-2.5 (3) MG/3ML IN SOLN
3.0000 mL | Freq: Once | RESPIRATORY_TRACT | Status: AC
  Administered 2017-01-18: 3 mL via RESPIRATORY_TRACT
  Filled 2017-01-18: qty 3

## 2017-01-18 MED ORDER — ONDANSETRON HCL 4 MG PO TABS: 4.0000 mg | ORAL_TABLET | Freq: Four times a day (QID) | ORAL | Status: DC | PRN

## 2017-01-18 MED ORDER — ACETAMINOPHEN 325 MG PO TABS: 650.0000 mg | ORAL_TABLET | Freq: Four times a day (QID) | ORAL | Status: DC | PRN

## 2017-01-18 MED ORDER — MAGNESIUM SULFATE 2 GM/50ML IV SOLN
2.0000 g | Freq: Once | INTRAVENOUS | Status: AC
  Administered 2017-01-18: 2 g via INTRAVENOUS
  Filled 2017-01-18: qty 50

## 2017-01-18 NOTE — ED Notes (Addendum)
Made Respiratory aware of neb treatment ordered and ABG

## 2017-01-18 NOTE — ED Provider Notes (Signed)
Emergency Department Provider Note   I have reviewed the triage vital signs and the nursing notes.   HISTORY  Chief Complaint Shortness of Breath   HPI Jaime Kelley is a 66 y.o. female with PMH of COPD, GERD, RA, and h/o PE presents to the emergency department for evaluation of difficulty breathing over the past 2 days. Patient had some URI symptoms last week with worsening cough and shortness of breath over the past 2 days. She denies any pain in the chest. She feels like she may have had a fever yesterday. No changes in symptoms with position change. Patient reports being compliant with her Coumadin. EMS arrived and reported that patient was having significant difficulty breathing and hypoxemia. They treated with steroids, nebulizerin route and the patient states she is feeling somewhat better. No radiation of symptoms.    Past Medical History:  Diagnosis Date  . COPD (chronic obstructive pulmonary disease) ([place])   . Discoid lupus    sees Dr. Allyson Sabal  . GERD (gastroesophageal reflux disease)   . History of colonic polyps   . Hx pulmonary embolism   . Hypothyroidism   . Rheumatoid arthritis(714.0)    sees Dr. Amil Amen   . Thyroid disease     Patient Active Problem List   Diagnosis Date Noted  . Type 2 diabetes mellitus ([place]) 05/25/2016  . Encounter for therapeutic drug monitoring 11/20/2013  . Anxiety state, unspecified 09/16/2013  . Long term (current) use of anticoagulants 09/11/2013  . HTN (hypertension) 05/13/2013  . 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 BACK STRAIN, LUMBAR 07/09/2009  . Rheumatoid arthritis ([place]) 02/10/2009  . THUMB SPRAIN 06/26/2008  . TOBACCO ABUSE 10/31/2007  . COPD with emphysema ([place]) 10/31/2007  . BRONCHITIS, ACUTE 06/17/2007  . EMBOLISM/THROMBOSIS, DEEP VSL PRXML LWR EXTRM 05/31/2007  . Hypothyroidism 04/15/2007  . EMPHYSEMA 04/15/2007  . GERD 04/15/2007  . LUPUS ERYTHEMATOSUS,  DISCOID 04/15/2007  . PULMONARY EMBOLISM, HX OF 04/15/2007  . Personal history of colonic polyps 04/15/2007  . DIVERTICULITIS, HX OF 04/15/2007    Past Surgical History:  Procedure Laterality Date  . ABDOMINAL HYSTERECTOMY    . arthroscopy right knee    . carpal tunnel release left wrist    . 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 TONSILLECTOMY      Current Outpatient Rx  . Order #: [id number] Class: Normal  . Order #: [id number] Class: Normal  . Order #: [id number] Class: Historical Med  . Order #: [id number] Class: Historical Med  . Order #: [id number] Class: Normal  . Order #: [id number] Class: Print  . Order #: [id number] Class: Normal  . Order #: [id number] Class: Normal  . Order #: [id number] Class: Historical Med  . Order #: [id number] Class: Normal  . Order #: [id number] Class: Normal  . Order #: [id number] Class: Normal  . Order #: [id number] Class: Normal  . Order #: [id number] Class: Normal  . Order #: [id number] Class: Historical Med  . Order #: [id number] Class: Normal  . Order #: [id number] Class: Normal    Allergies Venlafaxine and Effexor [venlafaxine hydrochloride]  Family History  Problem Relation Age of Onset  . Arthritis Other   . Coronary artery disease Other   . Hypertension Other   . Stroke Other   . Cancer Other     lung  . Lymphoma Son     Social History Social History  Substance Use Topics  . Smoking status: Current Every Day Smoker    Packs/day: 1.00    Years: 40.00  Types: Cigarettes  . Smokeless tobacco: Never Used  . Alcohol use No    Review of Systems  Constitutional: No fever/chills Eyes: No visual changes. ENT: No sore throat. Cardiovascular: Denies chest pain. Respiratory: Positive shortness of breath. Gastrointestinal: No abdominal pain.  No nausea, no vomiting.  No diarrhea.  No constipation. Genitourinary: Negative for dysuria. Musculoskeletal: Negative for back pain. Skin: Negative for rash. Neurological: Negative for headaches, focal  weakness or numbness.  10-point ROS otherwise negative.  ____________________________________________   PHYSICAL EXAM:  VITAL SIGNS: ED Triage Vitals  Enc Vitals Group     BP 01/18/17 1443 (!) 82/58     Pulse Rate 01/18/17 1443 92     Resp 01/18/17 1443 (!) 22     Temp 01/18/17 1443 98.4 F (36.9 C)     Temp Source 01/18/17 1443 Oral     SpO2 01/18/17 1414 97 %     Weight 01/18/17 1426 202 lb (91.6 kg)     Height 01/18/17 1426 5\' 2"  (1.575 m)     Pain Score 01/18/17 1425 0   Constitutional: Alert with notable dyspnea. Speaking in short 3-4 word sentences.  Eyes: Conjunctivae are normal.  Head: Atraumatic. Nose: No congestion/rhinnorhea. Mouth/Throat: Mucous membranes are dry.  Neck: No stridor.   Cardiovascular: Normal rate, regular rhythm. Good peripheral circulation. Grossly normal heart sounds.   Respiratory: Increased respiratory effort.  No retractions. Lungs with diffuse expiratory wheezing bilaterally. No rales or rhonchi.  Gastrointestinal: Soft and nontender. No distention.  Musculoskeletal: No lower extremity tenderness nor edema. No gross deformities of extremities. Neurologic:  Normal speech and language. No gross focal neurologic deficits are appreciated.  Skin:  Skin is warm, dry and intact. No rash noted.  ____________________________________________   LABS (all labs ordered are listed, but only abnormal results are displayed)  Labs Reviewed  CBC WITH DIFFERENTIAL/PLATELET - Abnormal; Notable for the following:       Result Value   RBC 3.06 (*)    Hemoglobin 9.2 (*)    HCT 26.6 (*)    All other components within normal limits  BLOOD GAS, ARTERIAL - Abnormal; Notable for the following:    pH, Arterial 7.475 (*)    pO2, Arterial 59.6 (*)    Bicarbonate 31.3 (*)    Acid-Base Excess 7.2 (*)    All other components within normal limits  I-STAT CHEM 8, ED - Abnormal; Notable for the following:    Sodium 127 (*)    Potassium 2.6 (*)    Chloride 85 (*)     Glucose, Bld 130 (*)    Calcium, Ion 0.88 (*)    All other components within normal limits  CULTURE, BLOOD (ROUTINE X 2)  CULTURE, BLOOD (ROUTINE X 2)  URINALYSIS, ROUTINE W REFLEX MICROSCOPIC  COMPREHENSIVE METABOLIC PANEL  PROTIME-INR  I-STAT CG4 LACTIC ACID, ED  I-STAT TROPOININ, ED   ____________________________________________  EKG   EKG Interpretation  Date/Time:  Thursday January 18 2017 14:59:04 EDT Ventricular Rate:  85 PR Interval:    QRS Duration: 96 QT Interval:  426 QTC Calculation: 507 R Axis:   -30 Text Interpretation:  Sinus rhythm Left axis deviation Prolonged QT interval No STEMI.  Confirmed by LONG MD, JOSHUA [id number]) on 01/18/2017 3:14:43 PM       ____________________________________________  RADIOLOGY  Dg Chest Portable 1 View  Result Date: 01/18/2017 CLINICAL DATA:  Shortness of breath.  History of COPD. EXAM: PORTABLE CHEST 1 VIEW COMPARISON:  Chest radiograph Mar 03, 2014 FINDINGS:  Cardiomediastinal silhouette is normal. Mildly calcified aortic knob. No pleural effusions or focal consolidations. Hyperinflation mild chronic interstitial changes. Trachea projects midline and there is no pneumothorax. Soft tissue planes and included osseous structures are non-suspicious. IMPRESSION: COPD, no acute cardiopulmonary process. Electronically Signed   By: Elon Alas M.D.   On: 01/18/2017 15:23    ____________________________________________   PROCEDURES  Procedure(s) performed:   Procedures  CRITICAL CARE Performed by: Margette Fast Total critical care time: 40 minutes Critical care time was exclusive of separately billable procedures and treating other patients. Critical care was necessary to treat or prevent imminent or life-threatening deterioration. Critical care was time spent personally by me on the following activities: development of treatment plan with patient and/or surrogate as well as nursing, discussions with consultants,  evaluation of patient's response to treatment, examination of patient, obtaining history from patient or surrogate, ordering and performing treatments and interventions, ordering and review of laboratory studies, ordering and review of radiographic studies, pulse oximetry and re-evaluation of patient's condition.  Nanda Quinton, MD Emergency Medicine  ____________________________________________   INITIAL IMPRESSION / ASSESSMENT AND PLAN / ED COURSE  Pertinent labs & imaging results that were available during my care of the patient were reviewed by me and considered in my medical decision making (see chart for details).  Patient resents to the emergency department for evaluation of difficulty breathing and hypotension. Symptoms have been gradually worsening over the past 2 days. No associated chest pain. Patient is on Coumadin with history of PE. Improved with nebulizer and steroid in route. Subjective fevers yesterday. Differential at this time is broad and includes infectious process, COPD exacerbation, or recurrent pulmonary embolism. We'll give small amount of IV fluid. Supplementing oxygen currently. EKG pending.   Hypotension improving. Unchanged mental status. Improved WOB after nebulizer treatment.   4:30 PM Patient is speaking in full sentences. Reports feeling improved after additional Duoneb. Hypotension improving with IVF. Lab cancelled CMP with low reading.   05:18 PM Discussed patient's case with hospitalist, Dr. Wynelle Cleveland. Patient and family (if present) updated with plan. Care transferred to hospitalist service.  I reviewed all nursing notes, vitals, pertinent old records, EKGs, labs, imaging (as available).  ____________________________________________  FINAL CLINICAL IMPRESSION(S) / ED DIAGNOSES  Final diagnoses:  COPD exacerbation ([place])  Hypokalemia  Hypotension, unspecified hypotension type  Hypoxemia     MEDICATIONS GIVEN DURING THIS VISIT:  Medications    potassium chloride 10 mEq in 100 mL IVPB (not administered)  ipratropium-albuterol (DUONEB) 0.5-2.5 (3) MG/3ML nebulizer solution 3 mL (3 mLs Nebulization Given 01/18/17 1502)  sodium chloride 0.9 % bolus 500 mL (0 mLs Intravenous Stopped 01/18/17 1519)  sodium chloride 0.9 % bolus 500 mL (500 mLs Intravenous New Bag/Given 01/18/17 1544)  azithromycin (ZITHROMAX) 500 mg in dextrose 5 % 250 mL IVPB (500 mg Intravenous New Bag/Given 01/18/17 1544)     NEW OUTPATIENT MEDICATIONS STARTED DURING THIS VISIT:  None   Note:  This document was prepared using Dragon voice recognition software and may include unintentional dictation errors.  Nanda Quinton, MD Emergency Medicine   Margette Fast, MD 01/18/17 [phone]

## 2017-01-18 NOTE — H&P (Addendum)
History and Physical    Jaime Kelley BZJ:[id number] DOB: 11-24-1950 DOA: 01/18/2017    PCP: [name], MD  Patient coming from: home  Chief Complaint: shortness of breath  HPI: Jaime Kelley is a 66 y.o. female with medical history significant of COPD, PE, who smokes one half packs a day, rheumatoid arthritis. The patient had a cough last week which improved with Zithromax however she continues to be severely short of breath and "feels like she is dying. She is not eating much. She is extremely weak. No complaint of chest pain. Has felt feverish but that was last week and has improved. It seems most of her symptoms improved however shortness of breath got worse. No sore throat, runny nose or headaches. EMS found her to have pulse ox in the 70s with wheezing. She was given albuterol Atrovent and Solu-Medrol by EMS.  ED Course: Pulse ox 95% on 4 L of oxygen. Blood pressure 82/58.  Sodium 127, potassium 2.6  Review of Systems:  Not urinating enough All other systems reviewed and apart from HPI, are negative.  Past Medical History:  Diagnosis Date  . COPD (chronic obstructive pulmonary disease) ([place])   . Discoid lupus    sees Dr. Allyson Sabal  . GERD (gastroesophageal reflux disease)   . History of colonic polyps   . Hx pulmonary embolism   . Hypothyroidism   . Rheumatoid arthritis(714.0)    sees Dr. Amil Amen   . Thyroid disease     Past Surgical History:  Procedure Laterality Date  . ABDOMINAL HYSTERECTOMY    . arthroscopy right knee    . carpal tunnel release left wrist    . 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 TONSILLECTOMY      Social History:   reports that she has been smoking Cigarettes.  She has a 40.00 pack-year smoking history. She has never used smokeless tobacco. She reports that she does not drink alcohol or use drugs.  Smokes 1.5 ppd Lives alone.   Allergies  Allergen Reactions  . Venlafaxine Rash  . Effexor [Venlafaxine Hydrochloride] Rash      Family History  Problem Relation Age of Onset  . Arthritis Other   . Coronary artery disease Other   . Hypertension Other   . Stroke Other   . Cancer Other     lung  . Lymphoma Son      Prior to Admission medications   Medication Sig Start Date End Date Taking? Authorizing Provider  albuterol (PROAIR HFA) 108 (90 BASE) MCG/ACT inhaler Inhale 2 puffs into the lungs every 4 (four) hours as needed for wheezing. 05/15/13  Yes Laurey Morale, MD  amLODipine (NORVASC) 10 MG tablet TAKE 1 TABLET (10 MG TOTAL) BY MOUTH DAILY. 05/25/16  Yes Laurey Morale, MD  Cholecalciferol 1000 UNITS capsule Take 1,000 Units by mouth daily. Vitamin D   Yes Historical Provider, MD  folic acid (FOLVITE) 1 MG tablet Take 1 mg by mouth daily.     Yes Historical Provider, MD  hydrochlorothiazide (HYDRODIURIL) 25 MG tablet TAKE 1 TABLET BY MOUTH EVERY DAY 06/12/16  Yes Laurey Morale, MD  HYDROcodone-acetaminophen (NORCO/VICODIN) 5-325 MG tablet TAKE 1 TABLET BY MOUTH EVERY 6 HOURS AS NEEDED FOR PAIN 12/26/16  Yes Laurey Morale, MD  lisinopril (PRINIVIL,ZESTRIL) 40 MG tablet TAKE 1 TABLET BY MOUTH EVERY DAY 08/23/16  Yes Laurey Morale, MD  metFORMIN (GLUCOPHAGE) 500 MG tablet Take twice daily Patient taking differently: Take 500  mg by mouth 2 (two) times daily with a meal. Take twice daily 05/25/16  Yes Laurey Morale, MD  sertraline (ZOLOFT) 100 MG tablet TAKE 1/2 TABLET BY MOUTH EVERY EVENING 01/04/14  Yes Laurey Morale, MD  sertraline (ZOLOFT) 100 MG tablet TAKE 1 TABLET EVERY DAY Patient taking differently: TAKE 1 TABLET PO EVERY DAY 09/21/16  Yes Laurey Morale, MD  SYNTHROID 137 MCG tablet TAKE 1 TABLET (137 MCG TOTAL) BY MOUTH DAILY BEFORE BREAKFAST. 05/25/16  Yes Laurey Morale, MD  warfarin (COUMADIN) 7.5 MG tablet Take as directed by anticoagulation clinic. Patient taking differently: Take 7.5 mg by mouth daily at 6 PM. Take  1 tablet (7.5 mg) on MWF 11/22/16  Yes Laurey Morale, MD  azithromycin (ZITHROMAX Z-PAK) 250  MG tablet As directed Patient not taking: Reported on 01/18/2017 01/12/17   Laurey Morale, MD  halobetasol (ULTRAVATE) 0.05 % cream Apply topically 2 (two) times daily. Patient not taking: Reported on 05/25/2016 09/14/15   Laurey Morale, MD  methotrexate (RHEUMATREX) 2.5 MG tablet Take 15 mg by mouth once a week. Take 6 tablets. Caution:Chemotherapy. Protect from light.    Historical Provider, MD  predniSONE (DELTASONE) 20 MG tablet Take 1 tablet (20 mg total) by mouth as needed. Patient not taking: Reported on 09/14/2015 02/03/15   Laurey Morale, MD  warfarin (COUMADIN) 10 MG tablet Take as directed by anticoagulation clinic. Patient taking differently: Take 10 mg by mouth as directed. Take 1 tablet (10 mg) on (Sun, Tues, Thurs, Sat) 12/21/16   Laurey Morale, MD    Physical Exam: Vitals:   01/18/17 1518 01/18/17 1525 01/18/17 1546 01/18/17 1645  BP: (!) 84/60  (!) 89/62 (!) 89/60  Pulse: 87  85 83  Resp: 17 20 14 19   Temp:      TempSrc:      SpO2: 95% 94% 93% 91%  Weight:      Height:          Constitutional: NAD, calm, comfortable Eyes: PERTLA, lids and conjunctivae pale ENMT: Mucous membranes are dry Posterior pharynx clear of any exudate or lesions. Normal dentition.  Neck: normal, supple, no masses, no thyromegaly Respiratory: wheeze in right lower lung field, poor air entry otherwise. Normal respiratory effort. No accessory muscle use.  Cardiovascular: S1 & S2 heard, regular rate and rhythm, no murmurs / rubs / gallops. No extremity edema. 2+ pedal pulses. No carotid bruits.  Abdomen: No distension, no tenderness, no masses palpated. No hepatosplenomegaly. Bowel sounds normal.  Musculoskeletal: no clubbing / cyanosis. No joint deformity upper and lower extremities. Good ROM, no contractures. Normal muscle tone.  Skin: no rashes, lesions, ulcers. No induration Neurologic: CN 2-12 grossly intact. Sensation intact, DTR normal. Strength 5/5 in all 4 limbs.  Psychiatric: Normal judgment  and insight. Alert and oriented x 3. Normal mood.     Labs on Admission: I have personally reviewed following labs and imaging studies  CBC:  Recent Labs Lab 01/18/17 1502 01/18/17 1622  WBC 5.3  --   NEUTROABS 3.6  --   HGB 9.2* 15.0  HCT 26.6* 44.0  MCV 86.9  --   PLT 163  --    Basic Metabolic Panel:  Recent Labs Lab 01/18/17 1609 01/18/17 1622  NA 128* 127*  K 2.6* 2.6*  CL 88* 85*  CO2 31  --   GLUCOSE 130* 130*  BUN 17 16  CREATININE 0.93 0.90  CALCIUM 8.2*  --  GFR: Estimated Creatinine Clearance: 64.7 mL/min (by C-G formula based on SCr of 0.9 mg/dL). Liver Function Tests:  Recent Labs Lab 01/18/17 1609  AST 21  ALT 17  ALKPHOS 52  BILITOT 0.4  PROT 6.6  ALBUMIN 3.4*   No results for input(s): LIPASE, AMYLASE in the last 168 hours. No results for input(s): AMMONIA in the last 168 hours. Coagulation Profile:  Recent Labs Lab 01/18/17 1609  INR 5.82*   Cardiac Enzymes: No results for input(s): CKTOTAL, CKMB, CKMBINDEX, TROPONINI in the last 168 hours. BNP (last 3 results) No results for input(s): PROBNP in the last 8760 hours. HbA1C: No results for input(s): HGBA1C in the last 72 hours. CBG: No results for input(s): GLUCAP in the last 168 hours. Lipid Profile: No results for input(s): CHOL, HDL, LDLCALC, TRIG, CHOLHDL, LDLDIRECT in the last 72 hours. Thyroid Function Tests: No results for input(s): TSH, T4TOTAL, FREET4, T3FREE, THYROIDAB in the last 72 hours. Anemia Panel: No results for input(s): VITAMINB12, FOLATE, FERRITIN, TIBC, IRON, RETICCTPCT in the last 72 hours. Urine analysis:    Component Value Date/Time   BILIRUBINUR n 02/03/2015 1507   PROTEINUR n 02/03/2015 1507   UROBILINOGEN 0.2 02/03/2015 1507   NITRITE n 02/03/2015 1507   LEUKOCYTESUR large (3+) 02/03/2015 1507   Sepsis Labs: @LABRCNTIP (procalcitonin:4,lacticidven:4) ) Recent Results (from the past 240 hour(s))  Culture, blood (routine x 2)     Status: None  (Preliminary result)   Collection Time: 01/18/17  3:07 PM  Result Value Ref Range Status   Specimen Description BLOOD RIGHT ANTECUBITAL  Final   Special Requests BOTTLES DRAWN AEROBIC AND ANAEROBIC 5CC  Final   Culture PENDING  Incomplete   Report Status PENDING  Incomplete     Radiological Exams on Admission: Dg Chest Portable 1 View  Result Date: 01/18/2017 CLINICAL DATA:  Shortness of breath.  History of COPD. EXAM: PORTABLE CHEST 1 VIEW COMPARISON:  Chest radiograph Mar 03, 2014 FINDINGS: Cardiomediastinal silhouette is normal. Mildly calcified aortic knob. No pleural effusions or focal consolidations. Hyperinflation mild chronic interstitial changes. Trachea projects midline and there is no pneumothorax. Soft tissue planes and included osseous structures are non-suspicious. IMPRESSION: COPD, no acute cardiopulmonary process. Electronically Signed   By: Elon Alas M.D.   On: 01/18/2017 15:23    EKG: Independently reviewed. Sinus rhythm, QT 507  Assessment/Plan Principal Problem:   Acute hypoxemic respiratory failure - likely COPD exacerbation- not on O2 at baseline - solumedrol, Nebs, O2 to keep pulse ox in 90s - as she finished a course of Zithro, will place on Doxy - check for influenza as we have seen a second surge of Influenza  Active Problems:  Hyponatremia/ Hypokalemia - due to HCTZ in setting of poor oral intake - replace  Hypotension - normal lactic acid -  continue steroids and IVF which should help  h/o PE - Coumadin to be held as INR elevated    TOBACCO ABUSE - nicotine patch - counseling to stop smoking     Rheumatoid arthritis - Methotrexate weekly    HTN (hypertension) Hold meds while hypotensive    Type 2 diabetes mellitus  - hold Metformin-- place on mod dose ISS     Hypothyroidism - synthroid   Obesity Body mass index is 36.95 kg/m.     DVT prophylaxis: on Coumadin Code Status: DNR  Family Communication:   Disposition Plan:  SDU  Consults called: none  Admission status: inpatient    [facility] MD [facility] Pager: [email or web address] Password  [email or web address] 7PM-7AM, please contact night-coverage   01/18/2017, 6:20 PM

## 2017-01-18 NOTE — Telephone Encounter (Signed)
I left a voice message for pt with below advice.

## 2017-01-18 NOTE — ED Notes (Signed)
Bed: XJ88 Expected date:  Expected time:  Means of arrival:  Comments: EMS SOB wheezing

## 2017-01-18 NOTE — ED Notes (Signed)
X-ray at bedside

## 2017-01-18 NOTE — ED Notes (Signed)
Called ICU to start 79min timer.  Was asked by Leslee Home RN to contact Dr Wynelle Cleveland, hospitalist, to see if patient can go to tele/med-surg floor instead.   Placed message to Dr Wynelle Cleveland through [facility], awaiting a call back.

## 2017-01-18 NOTE — ED Triage Notes (Signed)
Per [facility] patient was seen week ago by primary doctor diagnosed with bronchitis and started AND finished her Zpak. SOB getting worse for 2 days. When EMS arrive patient was 75% on room air, with bilateral wheezing. EMS gave 10mg  Albuterol, .5mg  Atrovent, and 125mg  solumedrol. Pt. Has hx if PE, DVT, and pneumonia.

## 2017-01-18 NOTE — Progress Notes (Signed)
ANTICOAGULATION CONSULT NOTE - Initial Consult  Pharmacy Consult for warfarin Indication: DVT  Allergies  Allergen Reactions  . Venlafaxine Rash  . Effexor [Venlafaxine Hydrochloride] Rash    Patient Measurements: Height: 5\' 2"  (157.5 cm) Weight: 202 lb (91.6 kg) IBW/kg (Calculated) : 50.1   Vital Signs: Temp: 98.4 F (36.9 C) (03/29 1443) Temp Source: Oral (03/29 1443) BP: 114/75 (03/29 1830) Pulse Rate: 88 (03/29 1830)  Labs:  Recent Labs  01/18/17 1502 01/18/17 1609 01/18/17 1622  HGB 9.2*  --  15.0  HCT 26.6*  --  44.0  PLT 163  --   --   LABPROT  --  54.0*  --   INR  --  5.82*  --   CREATININE  --  0.93 0.90    Estimated Creatinine Clearance: 64.7 mL/min (by C-G formula based on SCr of 0.9 mg/dL).   Medical History: Past Medical History:  Diagnosis Date  . COPD (chronic obstructive pulmonary disease) ([place])   . Discoid lupus    sees Dr. Allyson Sabal  . GERD (gastroesophageal reflux disease)   . History of colonic polyps   . Hx pulmonary embolism   . Hypothyroidism   . Rheumatoid arthritis(714.0)    sees Dr. Amil Amen   . Thyroid disease     Medications:  Scheduled:  . doxycycline  100 mg Oral Q12H  . enoxaparin (LOVENOX) injection  40 mg Subcutaneous Q24H  . feeding supplement (GLUCERNA SHAKE)  237 mL Oral TID BM  . [START ON 01/19/2017] insulin aspart  0-15 Units Subcutaneous TID WC  . insulin aspart  0-5 Units Subcutaneous QHS  . ipratropium-albuterol  3 mL Nebulization QID  . methylPREDNISolone (SOLU-MEDROL) injection  40 mg Intravenous Q12H  . nicotine  21 mg Transdermal Daily  . potassium chloride  40 mEq Oral Q4H  . sodium chloride flush  3 mL Intravenous Q12H   Infusions:  . sodium chloride    . sodium chloride    . magnesium sulfate 1 - 4 g bolus IVPB    . potassium chloride 10 mEq (01/18/17 1822)    Assessment: 66 yo female presented to ER with shortness of breath with hx COPD. Also patient with hx PE currently on warfarin at a dosing  regimen of 10mg  daily except 7.5mg  MWF  Today, 01/18/17  INR supratherapeutic with last reported dose being 3/28 at 1900  No reported bleeding  CBC Ok  Goal of Therapy:  INR 2-3    Plan:  1) No warfarin today due to INR > 5 2) Daily INR   Adrian Saran, PharmD, BCPS Pager [phone] 01/18/2017 6:53 PM

## 2017-01-18 NOTE — ED Notes (Signed)
Pam Charge RN called, stating 20 min timer can start now.

## 2017-01-18 NOTE — Progress Notes (Signed)
CRITICAL VALUE ALERT  Critical value received:  INR 5.74   Date of notification:  01-18-17  Time of notification:  2015  Critical value read back:Yes.    Nurse who received alert:  Mendel Corning  MD notified (1st page):  Olevia Bowens  Time of first page:  [phone]  MD notified (2nd page):  Time of second page:  Responding MD:   Time MD responded:

## 2017-01-19 LAB — CBC
HEMATOCRIT: 41.1 % (ref 36.0–46.0)
HEMOGLOBIN: 14.2 g/dL (ref 12.0–15.0)
MCH: 29.6 pg (ref 26.0–34.0)
MCHC: 34.5 g/dL (ref 30.0–36.0)
MCV: 85.8 fL (ref 78.0–100.0)
Platelets: 285 10*3/uL (ref 150–400)
RBC: 4.79 MIL/uL (ref 3.87–5.11)
RDW: 15.2 % (ref 11.5–15.5)
WBC: 6.1 10*3/uL (ref 4.0–10.5)

## 2017-01-19 LAB — RESPIRATORY PANEL BY PCR
ADENOVIRUS-RVPPCR: NOT DETECTED
Bordetella pertussis: NOT DETECTED
CORONAVIRUS NL63-RVPPCR: NOT DETECTED
Chlamydophila pneumoniae: NOT DETECTED
Coronavirus 229E: NOT DETECTED
Coronavirus HKU1: NOT DETECTED
Coronavirus OC43: NOT DETECTED
INFLUENZA A-RVPPCR: NOT DETECTED
INFLUENZA B-RVPPCR: NOT DETECTED
METAPNEUMOVIRUS-RVPPCR: NOT DETECTED
MYCOPLASMA PNEUMONIAE-RVPPCR: NOT DETECTED
PARAINFLUENZA VIRUS 2-RVPPCR: NOT DETECTED
PARAINFLUENZA VIRUS 3-RVPPCR: NOT DETECTED
PARAINFLUENZA VIRUS 4-RVPPCR: NOT DETECTED
Parainfluenza Virus 1: NOT DETECTED
RESPIRATORY SYNCYTIAL VIRUS-RVPPCR: NOT DETECTED
RHINOVIRUS / ENTEROVIRUS - RVPPCR: NOT DETECTED

## 2017-01-19 LAB — GLUCOSE, CAPILLARY
GLUCOSE-CAPILLARY: 130 mg/dL — AB (ref 65–99)
Glucose-Capillary: 129 mg/dL — ABNORMAL HIGH (ref 65–99)
Glucose-Capillary: 119 mg/dL — ABNORMAL HIGH (ref 65–99)
Glucose-Capillary: 146 mg/dL — ABNORMAL HIGH (ref 65–99)

## 2017-01-19 LAB — BLOOD CULTURE ID PANEL (REFLEXED)
ACINETOBACTER BAUMANNII: NOT DETECTED
CANDIDA KRUSEI: NOT DETECTED
Candida albicans: NOT DETECTED
Candida glabrata: NOT DETECTED
Candida parapsilosis: NOT DETECTED
Candida tropicalis: NOT DETECTED
ENTEROCOCCUS SPECIES: NOT DETECTED
ESCHERICHIA COLI: NOT DETECTED
Enterobacter cloacae complex: NOT DETECTED
Enterobacteriaceae species: NOT DETECTED
Haemophilus influenzae: NOT DETECTED
Klebsiella oxytoca: NOT DETECTED
Klebsiella pneumoniae: NOT DETECTED
LISTERIA MONOCYTOGENES: NOT DETECTED
METHICILLIN RESISTANCE: DETECTED — AB
NEISSERIA MENINGITIDIS: NOT DETECTED
PSEUDOMONAS AERUGINOSA: NOT DETECTED
Proteus species: NOT DETECTED
SERRATIA MARCESCENS: NOT DETECTED
STAPHYLOCOCCUS AUREUS BCID: NOT DETECTED
STREPTOCOCCUS PNEUMONIAE: NOT DETECTED
STREPTOCOCCUS SPECIES: NOT DETECTED
Staphylococcus species: DETECTED — AB
Streptococcus agalactiae: NOT DETECTED
Streptococcus pyogenes: NOT DETECTED

## 2017-01-19 LAB — MRSA PCR SCREENING: MRSA by PCR: NEGATIVE

## 2017-01-19 LAB — URINALYSIS, ROUTINE W REFLEX MICROSCOPIC
BILIRUBIN URINE: NEGATIVE
GLUCOSE, UA: NEGATIVE mg/dL
Ketones, ur: NEGATIVE mg/dL
Nitrite: NEGATIVE
PH: 6 (ref 5.0–8.0)
Protein, ur: NEGATIVE mg/dL
SPECIFIC GRAVITY, URINE: 1.008 (ref 1.005–1.030)

## 2017-01-19 LAB — BASIC METABOLIC PANEL
ANION GAP: 9 (ref 5–15)
BUN: 12 mg/dL (ref 6–20)
CALCIUM: 8.7 mg/dL — AB (ref 8.9–10.3)
CO2: 32 mmol/L (ref 22–32)
Chloride: 90 mmol/L — ABNORMAL LOW (ref 101–111)
Creatinine, Ser: 0.69 mg/dL (ref 0.44–1.00)
GLUCOSE: 158 mg/dL — AB (ref 65–99)
POTASSIUM: 3.7 mmol/L (ref 3.5–5.1)
SODIUM: 131 mmol/L — AB (ref 135–145)

## 2017-01-19 LAB — PROTIME-INR
INR: 5.09
Prothrombin Time: 48.6 seconds — ABNORMAL HIGH (ref 11.4–15.2)

## 2017-01-19 LAB — MAGNESIUM: MAGNESIUM: 2.4 mg/dL (ref 1.7–2.4)

## 2017-01-19 LAB — HEMOGLOBIN A1C
Hgb A1c MFr Bld: 6.1 % — ABNORMAL HIGH (ref 4.8–5.6)
Mean Plasma Glucose: 128 mg/dL

## 2017-01-19 MED ORDER — POTASSIUM CHLORIDE CRYS ER 20 MEQ PO TBCR: 40.0000 meq | EXTENDED_RELEASE_TABLET | Freq: Once | ORAL | Status: DC

## 2017-01-19 MED ORDER — HYDROCODONE-ACETAMINOPHEN 5-325 MG PO TABS
1.0000 | ORAL_TABLET | Freq: Four times a day (QID) | ORAL | Status: DC | PRN
  Administered 2017-01-19: 1 via ORAL
  Filled 2017-01-19: qty 1

## 2017-01-19 MED ORDER — POTASSIUM CHLORIDE CRYS ER 20 MEQ PO TBCR
40.0000 meq | EXTENDED_RELEASE_TABLET | Freq: Once | ORAL | Status: AC
  Administered 2017-01-19: 40 meq via ORAL
  Filled 2017-01-19: qty 2

## 2017-01-19 NOTE — Progress Notes (Signed)
PHARMACY - PHYSICIAN COMMUNICATION CRITICAL VALUE ALERT - BLOOD CULTURE IDENTIFICATION (BCID)  Results for orders placed or performed during the hospital encounter of 01/18/17  Blood Culture ID Panel (Reflexed) (Collected: 01/18/2017  3:07 PM)  Result Value Ref Range   Enterococcus species NOT DETECTED NOT DETECTED   Listeria monocytogenes NOT DETECTED NOT DETECTED   Staphylococcus species DETECTED (A) NOT DETECTED   Staphylococcus aureus NOT DETECTED NOT DETECTED   Methicillin resistance DETECTED (A) NOT DETECTED   Streptococcus species NOT DETECTED NOT DETECTED   Streptococcus agalactiae NOT DETECTED NOT DETECTED   Streptococcus pneumoniae NOT DETECTED NOT DETECTED   Streptococcus pyogenes NOT DETECTED NOT DETECTED   Acinetobacter baumannii NOT DETECTED NOT DETECTED   Enterobacteriaceae species NOT DETECTED NOT DETECTED   Enterobacter cloacae complex NOT DETECTED NOT DETECTED   Escherichia coli NOT DETECTED NOT DETECTED   Klebsiella oxytoca NOT DETECTED NOT DETECTED   Klebsiella pneumoniae NOT DETECTED NOT DETECTED   Proteus species NOT DETECTED NOT DETECTED   Serratia marcescens NOT DETECTED NOT DETECTED   Haemophilus influenzae NOT DETECTED NOT DETECTED   Neisseria meningitidis NOT DETECTED NOT DETECTED   Pseudomonas aeruginosa NOT DETECTED NOT DETECTED   Candida albicans NOT DETECTED NOT DETECTED   Candida glabrata NOT DETECTED NOT DETECTED   Candida krusei NOT DETECTED NOT DETECTED   Candida parapsilosis NOT DETECTED NOT DETECTED   Candida tropicalis NOT DETECTED NOT DETECTED   Name of physician (or Provider) Contacted: Dr. Dallie Piles  Changes to prescribed antibiotics required: none  Minda Ditto 01/19/2017  11:46 AM

## 2017-01-19 NOTE — Progress Notes (Signed)
Nutrition Brief Note  Patient identified on the Malnutrition Screening Tool (MST) Report and consult per   Wt Readings from Last 15 Encounters:  01/18/17 203 lb 11.3 oz (92.4 kg)  05/25/16 203 lb (92.1 kg)  09/14/15 190 lb (86.2 kg)  02/03/15 196 lb (88.9 kg)  03/11/14 213 lb (96.6 kg)  09/16/13 217 lb (98.4 kg)  05/13/13 218 lb (98.9 kg)  12/11/11 195 lb (88.5 kg)  10/12/11 194 lb (88 kg)  07/12/11 199 lb (90.3 kg)  05/12/11 196 lb (88.9 kg)  11/18/10 171 lb (77.6 kg)  06/21/10 162 lb (73.5 kg)  11/18/09 165 lb (74.8 kg)  07/26/09 158 lb 8 oz (71.9 kg)    Body mass index is 37.26 kg/m. Patient meets criteria for obesity based on current BMI. Skin WDL.   Pt with hx of COPD and for the 1 week PTA had an ongoing cough with severe SOB. Due to these symptoms during this time, pt was not feeling hungry and had decreased appetite and intakes. Since hospitalization and administration of medications, these symptoms have begun to resolve which has led to appetite returning. Current diet order is Carb Modified, patient is consuming approximately 75-100% of meals at this time.   Labs and medications reviewed. IVF: NS @ 100 mL/hr.   Ensure Enlive ordered BID and Glucerna Shake TID per ONS protocol. Pt has been refusing all offers of these supplements; will d/c both at this time. No nutrition interventions warranted at this time. If nutrition issues arise, please consult RD.     Jarome Matin, MS, RD, LDN, [facility] Inpatient Clinical Dietitian Pager # [phone] After hours/weekend pager # [phone]

## 2017-01-19 NOTE — Care Management Note (Signed)
Case Management Note  Patient Details  Name: Jaime Kelley MRN: [id number] Date of Birth: April 18, 1951  Subjective/Objective:             COPD       Action/Plan:Date:  January 19, 2017 Chart reviewed for concurrent status and case management needs. Will continue to follow patient progress. Discharge Planning: following for needs Expected discharge date: [id number] Velva Harman, BSN, [place], [place]   Expected Discharge Date:   (unknown)               Expected Discharge Plan:  Home/Self Care  In-House Referral:     Discharge planning Services     Post Acute Care Choice:    Choice offered to:     DME Arranged:    DME Agency:     HH Arranged:    [place] Agency:     Status of Service:  In process, will continue to follow  If discussed at Long Length of Stay Meetings, dates discussed:    Additional Comments:  Leeroy Cha, RN 01/19/2017, 9:47 AM

## 2017-01-19 NOTE — Progress Notes (Signed)
[place] for warfarin Indication: DVT  Allergies  Allergen Reactions  . Venlafaxine Rash  . Effexor [Venlafaxine Hydrochloride] Rash   Patient Measurements: Height: 5\' 2"  (157.5 cm) Weight: 203 lb 11.3 oz (92.4 kg) IBW/kg (Calculated) : 50.1  Vital Signs: Temp: 97.5 F (36.4 C) (03/30 0800) Temp Source: Axillary (03/30 0800) BP: 137/75 (03/30 0600)  Labs:  Recent Labs  01/18/17 1502 01/18/17 1609 01/18/17 1622 01/18/17 1920 01/19/17 0312  HGB 9.2*  --  15.0  --  14.2  HCT 26.6*  --  44.0  --  41.1  PLT 163  --   --   --  285  LABPROT  --  54.0*  --  53.5* 48.6*  INR  --  5.82*  --  5.74* 5.09*  CREATININE  --  0.93 0.90  --  0.69   Estimated Creatinine Clearance: 73.2 mL/min (by C-G formula based on SCr of 0.69 mg/dL).  Medical History: Past Medical History:  Diagnosis Date  . COPD (chronic obstructive pulmonary disease) ([place])   . Discoid lupus    sees Dr. Allyson Sabal  . GERD (gastroesophageal reflux disease)   . History of colonic polyps   . Hx pulmonary embolism   . Hypothyroidism   . Rheumatoid arthritis(714.0)    sees Dr. Amil Amen   . Thyroid disease     Medications:  Scheduled:  . doxycycline  100 mg Oral Q12H  . feeding supplement (ENSURE ENLIVE)  237 mL Oral BID BM  . feeding supplement (GLUCERNA SHAKE)  237 mL Oral TID BM  . insulin aspart  0-15 Units Subcutaneous TID WC  . insulin aspart  0-5 Units Subcutaneous QHS  . ipratropium-albuterol  3 mL Nebulization QID  . mouth rinse  15 mL Mouth Rinse BID  . methylPREDNISolone (SOLU-MEDROL) injection  40 mg Intravenous Q12H  . nicotine  21 mg Transdermal Daily  . potassium chloride  40 mEq Oral Once  . sodium chloride flush  3 mL Intravenous Q12H  . Warfarin - Pharmacist Dosing Inpatient   Does not apply q1800   Assessment: 53 yoF to ER 3/29 with shortness of breath, hx COPD and Tobacco use. Hx of PE on chronic Warfarin: 7.5mg  MWF, 10mg  other days - last dose  3/28  Patient completed Z pak 3/23 - likely cause of supratherapeutic INR on admit  Today, 01/19/17  INR remains supratherapeutic   Doxycycline may also increase INR  No reported bleeding,  no apparent need for immediate reversal  CBC wnl (3/30), po intake appears good  Goal of Therapy:  INR 2-3 Monitor CBC    Plan:   No Warfarin today  Daily Protime/INR  Minda Ditto PharmD Pager [phone] 01/19/2017, 8:45 AM

## 2017-01-19 NOTE — Progress Notes (Signed)
CRITICAL VALUE ALERT  Critical value received:  INR 5.09  Date of notification:  01-19-17  Time of notification:  1219  Critical value read back:Yes.    Nurse who received alert:  Mendel Corning  MD notified (1st page):  A. Hamad    Time of first page:  0507 MD notified (2nd page):  Time of second page:  Responding MD:    Time MD responded:

## 2017-01-19 NOTE — Progress Notes (Signed)
Patient Demographics:    Jaime Kelley, is a 66 y.o. female, DOB - [birth year], AUQ:[id number]  Admit date - 01/18/2017   Admitting Physician Debbe Odea, MD  Outpatient Primary MD for the patient is [name], MD  LOS - 1   Chief Complaint  Patient presents with  . Shortness of Breath        Subjective:    [name] today has no fevers, no emesis,  No chest pain,  Cough or shortness of breath persist   Assessment  & Plan :    Principal Problem:   Acute hypoxemic respiratory failure (HCC) Active Problems:   Hypothyroidism   TOBACCO ABUSE   COPD with emphysema (HCC)   GERD   Rheumatoid arthritis (HCC)   HTN (hypertension)   Type 2 diabetes mellitus (HCC)   1)Acute Hypoxemic Respiratory Failure- patient was not previously on home O2, suspected hypoxia secondary to COPD exacerbation, influenza test and respiratory panel are negative, continue IV Solu-Medrol, bronchodilators and doxycycline. Okay to wean off oxygen if able  2)Coag Neg Staph Bacteremia- coag-negative staph bacteremia, one out of 2 bottles suspect contaminant. No leukocytosis and patient does not look toxic. Lactic acid was normal  3)HypoNatremia- improved, HCTZ has been stopped, sodium is 131, avoid excessive free water intake  4)H/o PE- Coumadin on hold as INR is elevated,  5)DM- anticipate worsening glycemic control due to steroids, Use Novolog/Humalog Sliding scale insulin with Accu-Cheks/Fingersticks as ordered   6)Tobacco Use Disorder- smoking cessation strongly advised  7)Rheumatoid Arthritis- continue methotrexate every weekly, ??? Patient has some degree of immunosuppression due to long-standing use of immunosuppressants      DVT prophylaxis: on Coumadin Code Status: DNR  Family Communication:   Disposition Plan: SDU  Consults called: none  Admission status: inpatient    Lab Results    Component Value Date   PLT 285 01/19/2017    Inpatient Medications  Scheduled Meds: . doxycycline  100 mg Oral Q12H  . insulin aspart  0-15 Units Subcutaneous TID WC  . insulin aspart  0-5 Units Subcutaneous QHS  . ipratropium-albuterol  3 mL Nebulization QID  . mouth rinse  15 mL Mouth Rinse BID  . methylPREDNISolone (SOLU-MEDROL) injection  40 mg Intravenous Q12H  . nicotine  21 mg Transdermal Daily  . potassium chloride  40 mEq Oral Once  . sodium chloride flush  3 mL Intravenous Q12H  . Warfarin - Pharmacist Dosing Inpatient   Does not apply q1800   Continuous Infusions: . sodium chloride 100 mL/hr at 01/19/17 1100   PRN Meds:.sodium chloride, acetaminophen **OR** acetaminophen, ondansetron **OR** ondansetron (ZOFRAN) IV, sodium chloride flush    Anti-infectives    Start     Dose/Rate Route Frequency Ordered Stop   01/18/17 2200  doxycycline (VIBRA-TABS) tablet 100 mg     100 mg Oral Every 12 hours 01/18/17 1827     01/18/17 1545  azithromycin (ZITHROMAX) 500 mg in dextrose 5 % 250 mL IVPB     500 mg 250 mL/hr over 60 Minutes Intravenous  Once 01/18/17 1534 01/18/17 1644        Objective:   Vitals:   01/19/17 0900 01/19/17 1000 01/19/17 1100 01/19/17 1200  BP:      Pulse:  Resp:    20  Temp:    98.2 F (36.8 C)  TempSrc:    Oral  SpO2: 93% 95% 97% 95%  Weight:      Height:        Wt Readings from Last 3 Encounters:  01/18/17 92.4 kg (203 lb 11.3 oz)  05/25/16 92.1 kg (203 lb)  09/14/15 86.2 kg (190 lb)     Intake/Output Summary (Last 24 hours) at 01/19/17 1254 Last data filed at 01/19/17 1252  Gross per 24 hour  Intake             2790 ml  Output             1650 ml  Net             1140 ml     Physical Exam  Gen:- Awake Alert,  In no apparent distress , obese HEENT:- [place].AT, No sclera icterus Neck-Supple Neck,No JVD,.  Lungs-  scattered wheezes and rhonchi bilaterally CV- S1, S2 normal Abd-  +ve B.Sounds, Abd Soft, No tenderness,    increased adiposity Extremity/Skin:- No  edema,       Data Review:   Micro Results Recent Results (from the past 240 hour(s))  Culture, blood (routine x 2)     Status: None (Preliminary result)   Collection Time: 01/18/17  3:07 PM  Result Value Ref Range Status   Specimen Description BLOOD RIGHT ANTECUBITAL  Final   Special Requests BOTTLES DRAWN AEROBIC AND ANAEROBIC 5CC  Final   Culture  Setup Time   Final    GRAM POSITIVE COCCI IN CLUSTERS AEROBIC BOTTLE ONLY Organism ID to follow CRITICAL RESULT CALLED TO, READ BACK BY AND VERIFIED WITH: A. Runyon Pharm.D. 11:30 01/19/17 ([email or web address]) Performed at [facility], [place] [address] [place], [place] [address]    Culture PENDING  Incomplete   Report Status PENDING  Incomplete  Blood Culture ID Panel (Reflexed)     Status: Abnormal   Collection Time: 01/18/17  3:07 PM  Result Value Ref Range Status   Enterococcus species NOT DETECTED NOT DETECTED Final   Listeria monocytogenes NOT DETECTED NOT DETECTED Final   Staphylococcus species DETECTED (A) NOT DETECTED Final    Comment: Methicillin (oxacillin) resistant coagulase negative staphylococcus. Possible blood culture contaminant (unless isolated from more than one blood culture draw or clinical case suggests pathogenicity). No antibiotic treatment is indicated for blood  culture contaminants. CRITICAL RESULT CALLED TO, READ BACK BY AND VERIFIED WITH: A. Runyon Pharm.D. 11:30 01/19/17 ([email or web address])    Staphylococcus aureus NOT DETECTED NOT DETECTED Final   Methicillin resistance DETECTED (A) NOT DETECTED Final    Comment: CRITICAL RESULT CALLED TO, READ BACK BY AND VERIFIED WITH: A. Runyon Pharm.D. 11:30 01/19/17 ([email or web address])    Streptococcus species NOT DETECTED NOT DETECTED Final   Streptococcus agalactiae NOT DETECTED NOT DETECTED Final   Streptococcus pneumoniae NOT DETECTED NOT DETECTED Final   Streptococcus pyogenes NOT DETECTED NOT DETECTED Final   Acinetobacter baumannii NOT  DETECTED NOT DETECTED Final   Enterobacteriaceae species NOT DETECTED NOT DETECTED Final   Enterobacter cloacae complex NOT DETECTED NOT DETECTED Final   Escherichia coli NOT DETECTED NOT DETECTED Final   Klebsiella oxytoca NOT DETECTED NOT DETECTED Final   Klebsiella pneumoniae NOT DETECTED NOT DETECTED Final   Proteus species NOT DETECTED NOT DETECTED Final   Serratia marcescens NOT DETECTED NOT DETECTED Final   Haemophilus influenzae NOT DETECTED NOT DETECTED Final   Neisseria meningitidis NOT DETECTED NOT  DETECTED Final   Pseudomonas aeruginosa NOT DETECTED NOT DETECTED Final   Candida albicans NOT DETECTED NOT DETECTED Final   Candida glabrata NOT DETECTED NOT DETECTED Final   Candida krusei NOT DETECTED NOT DETECTED Final   Candida parapsilosis NOT DETECTED NOT DETECTED Final   Candida tropicalis NOT DETECTED NOT DETECTED Final    Comment: Performed at [facility], [place] [address], [place], [place] [address]  Respiratory Panel by PCR     Status: None   Collection Time: 01/18/17  7:39 PM  Result Value Ref Range Status   Adenovirus NOT DETECTED NOT DETECTED Final   Coronavirus 229E NOT DETECTED NOT DETECTED Final   Coronavirus HKU1 NOT DETECTED NOT DETECTED Final   Coronavirus NL63 NOT DETECTED NOT DETECTED Final   Coronavirus OC43 NOT DETECTED NOT DETECTED Final   Metapneumovirus NOT DETECTED NOT DETECTED Final   Rhinovirus / Enterovirus NOT DETECTED NOT DETECTED Final   Influenza A NOT DETECTED NOT DETECTED Final   Influenza B NOT DETECTED NOT DETECTED Final   Parainfluenza Virus 1 NOT DETECTED NOT DETECTED Final   Parainfluenza Virus 2 NOT DETECTED NOT DETECTED Final   Parainfluenza Virus 3 NOT DETECTED NOT DETECTED Final   Parainfluenza Virus 4 NOT DETECTED NOT DETECTED Final   Respiratory Syncytial Virus NOT DETECTED NOT DETECTED Final   Bordetella pertussis NOT DETECTED NOT DETECTED Final   Chlamydophila pneumoniae NOT DETECTED NOT DETECTED Final   Mycoplasma  pneumoniae NOT DETECTED NOT DETECTED Final    Comment: Performed at [facility], [place] [address], [place], [place] [address]  MRSA PCR Screening     Status: None   Collection Time: 01/18/17 10:41 PM  Result Value Ref Range Status   MRSA by PCR NEGATIVE NEGATIVE Final    Comment:        The GeneXpert MRSA Assay (FDA approved for NASAL specimens only), is one component of a comprehensive MRSA colonization surveillance program. It is not intended to diagnose MRSA infection nor to guide or monitor treatment for MRSA infections. Performed at [facility], [place] [address], [place],  [address]     Radiology Reports Dg Chest Portable 1 View  Result Date: 01/18/2017 CLINICAL DATA:  Shortness of breath.  History of COPD. EXAM: PORTABLE CHEST 1 VIEW COMPARISON:  Chest radiograph Mar 03, 2014 FINDINGS: Cardiomediastinal silhouette is normal. Mildly calcified aortic knob. No pleural effusions or focal consolidations. Hyperinflation mild chronic interstitial changes. Trachea projects midline and there is no pneumothorax. Soft tissue planes and included osseous structures are non-suspicious. IMPRESSION: COPD, no acute cardiopulmonary process. Electronically Signed   By: Elon Alas M.D.   On: 01/18/2017 15:23     CBC  Recent Labs Lab 01/18/17 1502 01/18/17 1622 01/19/17 0312  WBC 5.3  --  6.1  HGB 9.2* 15.0 14.2  HCT 26.6* 44.0 41.1  PLT 163  --  285  MCV 86.9  --  85.8  MCH 30.1  --  29.6  MCHC 34.6  --  34.5  RDW 15.3  --  15.2  LYMPHSABS 1.2  --   --   MONOABS 0.4  --   --   EOSABS 0.0  --   --   BASOSABS 0.0  --   --     Chemistries   Recent Labs Lab 01/18/17 1609 01/18/17 1622 01/19/17 0312  NA 128* 127* 131*  K 2.6* 2.6* 3.7  CL 88* 85* 90*  CO2 31  --  32  GLUCOSE 130* 130* 158*  BUN 17 16 12   CREATININE 0.93 0.90 0.69  CALCIUM 8.2*  --  8.7*  MG  --   --  2.4  AST 21  --   --   ALT 17  --   --   ALKPHOS 52  --   --   BILITOT 0.4  --    --    ------------------------------------------------------------------------------------------------------------------ No results for input(s): CHOL, HDL, LDLCALC, TRIG, CHOLHDL, LDLDIRECT in the last 72 hours.  Lab Results  Component Value Date   HGBA1C 6.1 (H) 01/18/2017   ------------------------------------------------------------------------------------------------------------------ No results for input(s): TSH, T4TOTAL, T3FREE, THYROIDAB in the last 72 hours.  Invalid input(s): FREET3 ------------------------------------------------------------------------------------------------------------------ No results for input(s): VITAMINB12, FOLATE, FERRITIN, TIBC, IRON, RETICCTPCT in the last 72 hours.  Coagulation profile  Recent Labs Lab 01/18/17 1609 01/18/17 1920 01/19/17 0312  INR 5.82* 5.74* 5.09*    No results for input(s): DDIMER in the last 72 hours.  Cardiac Enzymes No results for input(s): CKMB, TROPONINI, MYOGLOBIN in the last 168 hours.  Invalid input(s): CK ------------------------------------------------------------------------------------------------------------------ No results found for: BNP   Raymar Joiner M.D on 01/19/2017 at 12:54 PM  Between 7am to 7pm - Pager - [phone]  After 7pm go to [email or web address] - password [email or web address]  [facility] -  Office  [phone]  Dragon dictation system was used to create this note, attempts have been made to correct errors, however presence of uncorrected errors is not a reflection quality of care provided

## 2017-01-20 LAB — BASIC METABOLIC PANEL
Anion gap: 7 (ref 5–15)
BUN: 15 mg/dL (ref 6–20)
CALCIUM: 8.1 mg/dL — AB (ref 8.9–10.3)
CO2: 30 mmol/L (ref 22–32)
Chloride: 95 mmol/L — ABNORMAL LOW (ref 101–111)
Creatinine, Ser: 0.65 mg/dL (ref 0.44–1.00)
GFR calc Af Amer: >60 mL/min (ref >60)
Glucose, Bld: 128 mg/dL — ABNORMAL HIGH (ref 65–99)
Potassium: 4.3 mmol/L (ref 3.5–5.1)
Sodium: 132 mmol/L — ABNORMAL LOW (ref 135–145)

## 2017-01-20 LAB — GLUCOSE, CAPILLARY
GLUCOSE-CAPILLARY: 117 mg/dL — AB (ref 65–99)
GLUCOSE-CAPILLARY: 120 mg/dL — AB (ref 65–99)
Glucose-Capillary: 111 mg/dL — ABNORMAL HIGH (ref 65–99)
Glucose-Capillary: 129 mg/dL — ABNORMAL HIGH (ref 65–99)
Glucose-Capillary: 128 mg/dL — ABNORMAL HIGH (ref 65–99)

## 2017-01-20 LAB — PROTIME-INR
INR: 3.7
PROTHROMBIN TIME: 37.6 s — AB (ref 11.4–15.2)

## 2017-01-20 MED ORDER — LEVOTHYROXINE SODIUM 25 MCG PO TABS
137.0000 ug | ORAL_TABLET | Freq: Every day | ORAL | Status: DC
  Administered 2017-01-21: 137 ug via ORAL
  Filled 2017-01-20 (×2): qty 1

## 2017-01-20 MED ORDER — HYDRALAZINE HCL 20 MG/ML IJ SOLN
10.0000 mg | Freq: Four times a day (QID) | INTRAMUSCULAR | Status: DC | PRN
  Administered 2017-01-20 (×2): 10 mg via INTRAVENOUS
  Filled 2017-01-20 (×2): qty 1

## 2017-01-20 MED ORDER — LISINOPRIL 10 MG PO TABS
10.0000 mg | ORAL_TABLET | Freq: Every day | ORAL | Status: DC
  Administered 2017-01-20 – 2017-01-21 (×2): 10 mg via ORAL
  Filled 2017-01-20 (×2): qty 1

## 2017-01-20 MED ORDER — AMLODIPINE BESYLATE 10 MG PO TABS
10.0000 mg | ORAL_TABLET | Freq: Every day | ORAL | Status: DC
  Administered 2017-01-20 – 2017-01-21 (×2): 10 mg via ORAL
  Filled 2017-01-20 (×2): qty 1

## 2017-01-20 MED ORDER — IPRATROPIUM-ALBUTEROL 0.5-2.5 (3) MG/3ML IN SOLN
3.0000 mL | Freq: Three times a day (TID) | RESPIRATORY_TRACT | Status: DC
  Administered 2017-01-20 – 2017-01-21 (×4): 3 mL via RESPIRATORY_TRACT
  Filled 2017-01-20 (×4): qty 3

## 2017-01-20 NOTE — Progress Notes (Addendum)
Report called and given to receiving RN Katharine Look.

## 2017-01-20 NOTE — Progress Notes (Signed)
Pt inquiring about home medications being restarted, mainly BP meds and synthroid.  Pt BP is 171/93 this morning-notified Dr. Denton Brick.

## 2017-01-20 NOTE — Progress Notes (Signed)
Pt ambulated to [facility] and back to bed with 4LO2 [place] and began to desat maintaining 82% O2 sats and became short of breath.  Patient recovered to O2 sats of 89% after one minute, breathing less labored.

## 2017-01-20 NOTE — Progress Notes (Signed)
RN spoke with Pt daughter [name] per pt request.  [name] reports pt lives with a roommate that smokes and it is "not the best living situation as far as cleanliness and health go."  [name] said please call her with any questions concerning this at [phone].

## 2017-01-20 NOTE — Progress Notes (Signed)
Report received from [employer]. Patient transferred to room 1445. A&Ox4. Oriented to room and call bell within reach.

## 2017-01-20 NOTE — Progress Notes (Addendum)
Patient Demographics:    Jaime Kelley, is a 66 y.o. female, DOB - Apr 16, 1951, KWI:[id number]  Admit date - 01/18/2017   Admitting Physician Debbe Odea, MD  Outpatient Primary MD for the patient is [name], MD  LOS - 2   Chief Complaint  Patient presents with  . Shortness of Breath        Subjective:    Jaime Kelley today has no fevers, no emesis,  No chest pain,  Sob and cough is improving   Assessment  & Plan :    Principal Problem:   Acute hypoxemic respiratory failure (HCC) Active Problems:   Hypothyroidism   TOBACCO ABUSE   COPD with emphysema (HCC)   GERD   Rheumatoid arthritis (HCC)   HTN (hypertension)   Type 2 diabetes mellitus (HCC)   1)Acute Hypoxemic Respiratory Failure- patient was not previously on home O2, suspected hypoxia secondary to COPD exacerbation, influenza test and respiratory panel are negative, continue IV Solu-Medrol, bronchodilators and doxycycline.  Down to oxygen at 4L/min,  c/n to wean off oxygen   2)Coag Neg Staph Bacteremia- coag-negative staph bacteremia, one out of 2 bottles suspect contaminant. No leukocytosis and patient does not look toxic. Lactic acid was normal  3)HypoNatremia- improved, HCTZ has been stopped, sodium is 132, avoid excessive free water intake  4)H/o PE- Coumadin on hold as INR is elevated, INR is 3.7 (down from 5.8), Pharmacy to manage coumadin  5)DM- anticipate worsening glycemic control due to steroids, Use Novolog/Humalog Sliding scale insulin with Accu-Cheks/Fingersticks as ordered   6)Tobacco Use Disorder- smoking cessation strongly advised  7)Rheumatoid Arthritis- continue methotrexate every weekly, ??? Patient has some degree of immunosuppression due to long-standing use of immunosuppressants  8)HTN- BP is not at goal, Restart Amlodipine and Lisinopril,  may use IV Hydralazine 10 mg  Every 4 hours Prn for  systolic blood pressure over 160 mmhg   DVT prophylaxis:on Coumadin Code Status:DNR Family Communication:  Consults called:none Admission status:inpatient     Lab Results  Component Value Date   PLT 285 01/19/2017    Inpatient Medications  Scheduled Meds: . amLODipine  10 mg Oral Daily  . doxycycline  100 mg Oral Q12H  . insulin aspart  0-15 Units Subcutaneous TID WC  . insulin aspart  0-5 Units Subcutaneous QHS  . ipratropium-albuterol  3 mL Nebulization TID  . levothyroxine  137 mcg Oral QAC breakfast  . lisinopril  10 mg Oral Daily  . mouth rinse  15 mL Mouth Rinse BID  . methylPREDNISolone (SOLU-MEDROL) injection  40 mg Intravenous Q12H  . nicotine  21 mg Transdermal Daily  . sodium chloride flush  3 mL Intravenous Q12H  . Warfarin - Pharmacist Dosing Inpatient   Does not apply q1800   Continuous Infusions: . sodium chloride 100 mL/hr at 01/20/17 1200   PRN Meds:.sodium chloride, acetaminophen **OR** acetaminophen, hydrALAZINE, HYDROcodone-acetaminophen, ondansetron **OR** ondansetron (ZOFRAN) IV, sodium chloride flush    Anti-infectives    Start     Dose/Rate Route Frequency Ordered Stop   01/18/17 2200  doxycycline (VIBRA-TABS) tablet 100 mg     100 mg Oral Every 12 hours 01/18/17 1827     01/18/17 1545  azithromycin (ZITHROMAX) 500 mg in dextrose 5 % 250 mL IVPB  500 mg 250 mL/hr over 60 Minutes Intravenous  Once 01/18/17 1534 01/18/17 1644        Objective:   Vitals:   01/20/17 1100 01/20/17 1135 01/20/17 1200 01/20/17 1245  BP:    (!) 166/86  Pulse:      Resp: 19  (!) 22   Temp:  97.9 F (36.6 C)    TempSrc:  Oral    SpO2: 95%  95%   Weight:      Height:        Wt Readings from Last 3 Encounters:  01/18/17 92.4 kg (203 lb 11.3 oz)  05/25/16 92.1 kg (203 lb)  09/14/15 86.2 kg (190 lb)     Intake/Output Summary (Last 24 hours) at 01/20/17 1428 Last data filed at 01/20/17 1250  Gross per 24 hour  Intake             2540 ml    Output             4050 ml  Net            -1510 ml     Physical Exam  Gen:- Awake Alert,  In no apparent distress  HEENT:- [place].AT, No sclera icterus Neck-Supple Neck,No JVD,.  Lungs-  Scattered wheezes and rhonchi bilaterally CV- S1, S2 normal Abd-  +ve B.Sounds, Abd Soft, No tenderness,    Extremity/Skin:- Neg Homan's    Data Review:   Micro Results Recent Results (from the past 240 hour(s))  Culture, blood (routine x 2)     Status: Abnormal (Preliminary result)   Collection Time: 01/18/17  3:07 PM  Result Value Ref Range Status   Specimen Description BLOOD RIGHT ANTECUBITAL  Final   Special Requests BOTTLES DRAWN AEROBIC AND ANAEROBIC 5CC  Final   Culture  Setup Time   Final    GRAM POSITIVE COCCI IN CLUSTERS AEROBIC BOTTLE ONLY CRITICAL RESULT CALLED TO, READ BACK BY AND VERIFIED WITH: A. Runyon Pharm.D. 11:30 01/19/17 ([email or web address]) Performed at [facility], [place] [address] [place], [place] [address]    Culture STAPHYLOCOCCUS SPECIES (COAGULASE NEGATIVE) (A)  Final   Report Status PENDING  Incomplete  Blood Culture ID Panel (Reflexed)     Status: Abnormal   Collection Time: 01/18/17  3:07 PM  Result Value Ref Range Status   Enterococcus species NOT DETECTED NOT DETECTED Final   Listeria monocytogenes NOT DETECTED NOT DETECTED Final   Staphylococcus species DETECTED (A) NOT DETECTED Final    Comment: Methicillin (oxacillin) resistant coagulase negative staphylococcus. Possible blood culture contaminant (unless isolated from more than one blood culture draw or clinical case suggests pathogenicity). No antibiotic treatment is indicated for blood  culture contaminants. CRITICAL RESULT CALLED TO, READ BACK BY AND VERIFIED WITH: A. Runyon Pharm.D. 11:30 01/19/17 ([email or web address])    Staphylococcus aureus NOT DETECTED NOT DETECTED Final   Methicillin resistance DETECTED (A) NOT DETECTED Final    Comment: CRITICAL RESULT CALLED TO, READ BACK BY AND VERIFIED WITH: A. Runyon  Pharm.D. 11:30 01/19/17 ([email or web address])    Streptococcus species NOT DETECTED NOT DETECTED Final   Streptococcus agalactiae NOT DETECTED NOT DETECTED Final   Streptococcus pneumoniae NOT DETECTED NOT DETECTED Final   Streptococcus pyogenes NOT DETECTED NOT DETECTED Final   Acinetobacter baumannii NOT DETECTED NOT DETECTED Final   Enterobacteriaceae species NOT DETECTED NOT DETECTED Final   Enterobacter cloacae complex NOT DETECTED NOT DETECTED Final   Escherichia coli NOT DETECTED NOT DETECTED Final   Klebsiella oxytoca NOT DETECTED NOT DETECTED  Final   Klebsiella pneumoniae NOT DETECTED NOT DETECTED Final   Proteus species NOT DETECTED NOT DETECTED Final   Serratia marcescens NOT DETECTED NOT DETECTED Final   Haemophilus influenzae NOT DETECTED NOT DETECTED Final   Neisseria meningitidis NOT DETECTED NOT DETECTED Final   Pseudomonas aeruginosa NOT DETECTED NOT DETECTED Final   Candida albicans NOT DETECTED NOT DETECTED Final   Candida glabrata NOT DETECTED NOT DETECTED Final   Candida krusei NOT DETECTED NOT DETECTED Final   Candida parapsilosis NOT DETECTED NOT DETECTED Final   Candida tropicalis NOT DETECTED NOT DETECTED Final    Comment: Performed at [facility], [place] [address], [place], [place] [address]  Culture, blood (routine x 2)     Status: None (Preliminary result)   Collection Time: 01/18/17  3:20 PM  Result Value Ref Range Status   Specimen Description BLOOD LEFT HAND  Final   Special Requests BOTTLES DRAWN AEROBIC AND ANAEROBIC 5CC  Final   Culture   Final    NO GROWTH 2 DAYS Performed at [facility], [place] [address] [place], [place] [address]    Report Status PENDING  Incomplete  Respiratory Panel by PCR     Status: None   Collection Time: 01/18/17  7:39 PM  Result Value Ref Range Status   Adenovirus NOT DETECTED NOT DETECTED Final   Coronavirus 229E NOT DETECTED NOT DETECTED Final   Coronavirus HKU1 NOT DETECTED NOT DETECTED Final   Coronavirus NL63 NOT  DETECTED NOT DETECTED Final   Coronavirus OC43 NOT DETECTED NOT DETECTED Final   Metapneumovirus NOT DETECTED NOT DETECTED Final   Rhinovirus / Enterovirus NOT DETECTED NOT DETECTED Final   Influenza A NOT DETECTED NOT DETECTED Final   Influenza B NOT DETECTED NOT DETECTED Final   Parainfluenza Virus 1 NOT DETECTED NOT DETECTED Final   Parainfluenza Virus 2 NOT DETECTED NOT DETECTED Final   Parainfluenza Virus 3 NOT DETECTED NOT DETECTED Final   Parainfluenza Virus 4 NOT DETECTED NOT DETECTED Final   Respiratory Syncytial Virus NOT DETECTED NOT DETECTED Final   Bordetella pertussis NOT DETECTED NOT DETECTED Final   Chlamydophila pneumoniae NOT DETECTED NOT DETECTED Final   Mycoplasma pneumoniae NOT DETECTED NOT DETECTED Final    Comment: Performed at [facility], [place] [address], [place], [place] [address]  MRSA PCR Screening     Status: None   Collection Time: 01/18/17 10:41 PM  Result Value Ref Range Status   MRSA by PCR NEGATIVE NEGATIVE Final    Comment:        The GeneXpert MRSA Assay (FDA approved for NASAL specimens only), is one component of a comprehensive MRSA colonization surveillance program. It is not intended to diagnose MRSA infection nor to guide or monitor treatment for MRSA infections. Performed at [facility], [place] [address], [place], [place] [address]     Radiology Reports Dg Chest Portable 1 View  Result Date: 01/18/2017 CLINICAL DATA:  Shortness of breath.  History of COPD. EXAM: PORTABLE CHEST 1 VIEW COMPARISON:  Chest radiograph Mar 03, 2014 FINDINGS: Cardiomediastinal silhouette is normal. Mildly calcified aortic knob. No pleural effusions or focal consolidations. Hyperinflation mild chronic interstitial changes. Trachea projects midline and there is no pneumothorax. Soft tissue planes and included osseous structures are non-suspicious. IMPRESSION: COPD, no acute cardiopulmonary process. Electronically Signed   By: Elon Alas M.D.   On:  01/18/2017 15:23     CBC  Recent Labs Lab 01/18/17 1502 01/18/17 1622  01/19/17 0312  WBC 5.3  --  6.1  HGB 9.2* 15.0 14.2  HCT 26.6* 44.0 41.1  PLT 163  --  285  MCV 86.9  --  85.8  MCH 30.1  --  29.6  MCHC 34.6  --  34.5  RDW 15.3  --  15.2  LYMPHSABS 1.2  --   --   MONOABS 0.4  --   --   EOSABS 0.0  --   --   BASOSABS 0.0  --   --     Chemistries   Recent Labs Lab 01/18/17 1609 01/18/17 1622 01/19/17 0312 01/20/17 0329  NA 128* 127* 131* 132*  K 2.6* 2.6* 3.7 4.3  CL 88* 85* 90* 95*  CO2 31  --  32 30  GLUCOSE 130* 130* 158* 128*  BUN 17 16 12 15   CREATININE 0.93 0.90 0.69 0.65  CALCIUM 8.2*  --  8.7* 8.1*  MG  --   --  2.4  --   AST 21  --   --   --   ALT 17  --   --   --   ALKPHOS 52  --   --   --   BILITOT 0.4  --   --   --    ------------------------------------------------------------------------------------------------------------------ No results for input(s): CHOL, HDL, LDLCALC, TRIG, CHOLHDL, LDLDIRECT in the last 72 hours.  Lab Results  Component Value Date   HGBA1C 6.1 (H) 01/18/2017   ------------------------------------------------------------------------------------------------------------------ No results for input(s): TSH, T4TOTAL, T3FREE, THYROIDAB in the last 72 hours.  Invalid input(s): FREET3 ------------------------------------------------------------------------------------------------------------------ No results for input(s): VITAMINB12, FOLATE, FERRITIN, TIBC, IRON, RETICCTPCT in the last 72 hours.  Coagulation profile  Recent Labs Lab 01/18/17 1609 01/18/17 1920 01/19/17 0312 01/20/17 0329  INR 5.82* 5.74* 5.09* 3.70    No results for input(s): DDIMER in the last 72 hours.  Cardiac Enzymes No results for input(s): CKMB, TROPONINI, MYOGLOBIN in the last 168 hours.  Invalid input(s): CK ------------------------------------------------------------------------------------------------------------------ No results  found for: BNP   Jaime Kelley M.D on 01/20/2017 at 2:28 PM  Between 7am to 7pm - Pager - [phone]  After 7pm go to [email or web address] - password [email or web address]  [facility] -  Office  [phone]  Dragon dictation system was used to create this note, attempts have been made to correct errors, however presence of uncorrected errors is not a reflection quality of care provided

## 2017-01-20 NOTE — Progress Notes (Signed)
[place] for warfarin Indication: DVT  Allergies  Allergen Reactions  . Venlafaxine Rash  . Effexor [Venlafaxine Hydrochloride] Rash   Patient Measurements: Height: 5\' 2"  (157.5 cm) Weight: 203 lb 11.3 oz (92.4 kg) IBW/kg (Calculated) : 50.1  Vital Signs: Temp: 98.4 F (36.9 C) (03/31 0400) Temp Source: Oral (03/31 0400) BP: 146/76 (03/31 0600)  Labs:  Recent Labs  01/18/17 1502  01/18/17 1622 01/18/17 1920 01/19/17 0312 01/20/17 0329  HGB 9.2*  --  15.0  --  14.2  --   HCT 26.6*  --  44.0  --  41.1  --   PLT 163  --   --   --  285  --   LABPROT  --   < >  --  53.5* 48.6* 37.6*  INR  --   < >  --  5.74* 5.09* 3.70  CREATININE  --   < > 0.90  --  0.69 0.65  < > = values in this interval not displayed. Estimated Creatinine Clearance: 73.2 mL/min (by C-G formula based on SCr of 0.65 mg/dL).  Medications:  Scheduled:  . doxycycline  100 mg Oral Q12H  . insulin aspart  0-15 Units Subcutaneous TID WC  . insulin aspart  0-5 Units Subcutaneous QHS  . ipratropium-albuterol  3 mL Nebulization QID  . mouth rinse  15 mL Mouth Rinse BID  . methylPREDNISolone (SOLU-MEDROL) injection  40 mg Intravenous Q12H  . nicotine  21 mg Transdermal Daily  . sodium chloride flush  3 mL Intravenous Q12H  . Warfarin - Pharmacist Dosing Inpatient   Does not apply q1800   Assessment: 30 yoF to ER 3/29 with shortness of breath, hx COPD and Tobacco use. Hx of PE on chronic Warfarin: 7.5mg  MWF, 10mg  other days - last dose 3/28  Patient started course of Azithromycin 3/23 - likely cause of supratherapeutic INR on admit  Today, 01/20/17  INR remains above therapeutic range, INR 3.7   Doxycycline may also increase INR  No reported bleeding,  no apparent need for immediate reversal  CBC wnl (3/30), po intake appears good  Goal of Therapy:  INR 2-3 Monitor CBC    Plan:   No Warfarin today  Daily Protime/INR  Minda Ditto PharmD Pager  [phone] 01/20/2017, 8:00 AM

## 2017-01-21 LAB — GLUCOSE, CAPILLARY
Glucose-Capillary: 110 mg/dL — ABNORMAL HIGH (ref 65–99)
Glucose-Capillary: 140 mg/dL — ABNORMAL HIGH (ref 65–99)
Glucose-Capillary: 93 mg/dL (ref 65–99)

## 2017-01-21 LAB — PROTIME-INR
INR: 1.69
PROTHROMBIN TIME: 20.1 s — AB (ref 11.4–15.2)

## 2017-01-21 LAB — CULTURE, BLOOD (ROUTINE X 2)

## 2017-01-21 MED ORDER — PREDNISONE 20 MG PO TABS: 20.0000 mg | ORAL_TABLET | Freq: Every day | ORAL | 0 refills | Status: AC

## 2017-01-21 MED ORDER — NICOTINE 21 MG/24HR TD PT24: 21.0000 mg | MEDICATED_PATCH | Freq: Every day | TRANSDERMAL | 0 refills | Status: DC

## 2017-01-21 MED ORDER — WARFARIN SODIUM 7.5 MG PO TABS: ORAL_TABLET | ORAL | 0 refills | Status: DC

## 2017-01-21 MED ORDER — IPRATROPIUM-ALBUTEROL 0.5-2.5 (3) MG/3ML IN SOLN: 3.0000 mL | Freq: Three times a day (TID) | RESPIRATORY_TRACT | 1 refills | Status: DC

## 2017-01-21 MED ORDER — WARFARIN SODIUM 5 MG PO TABS
7.5000 mg | ORAL_TABLET | Freq: Once | ORAL | Status: AC
  Administered 2017-01-21: 18:00:00 7.5 mg via ORAL
  Filled 2017-01-21: qty 1

## 2017-01-21 MED ORDER — AZITHROMYCIN 500 MG PO TABS: ORAL_TABLET | ORAL | 0 refills | Status: DC

## 2017-01-21 NOTE — Progress Notes (Signed)
SATURATION QUALIFICATIONS: (This note is used to comply with regulatory documentation for home oxygen)  Patient Saturations on Room Air at Rest = 93%  Patient Saturations on Room Air while Ambulating = 88%  Patient Saturations on 5 Liters of oxygen while Ambulating = 90%  Please briefly explain why patient needs home oxygen:

## 2017-01-21 NOTE — Care Management Note (Addendum)
Case Management Note  Patient Details  Name: Jaime Kelley MRN: [id number] Date of Birth: December 21, 1950  Subjective/Objective:      Acute resp failure, RA, COPD              Action/Plan: Discharge Planning: NCM spoke to pt and explained [facility] will deliver oxygen concentrator to her home and portable to her room prior to dc. Pt has RW at home. Offered choice for [facility]. Pt agreeable to [facility] for HH. Contacted [facility]iaison with new referral for oxygen and HH.   PCP [name] A   Expected Discharge Date:  01/21/17               Expected Discharge Plan:  Home/Self Care  In-House Referral:  NA  Discharge planning Services  CM Consult  Post Acute Care Choice:  NA Choice offered to:  NA  DME Arranged:  Oxygen DME Agency:  [place]:  NA [place] Agency:  NA  Status of Service:  Completed, signed off  If discussed at [place] of Stay Meetings, dates discussed:    Additional Comments:  Erenest Rasher, RN 01/21/2017, 4:32 PM

## 2017-01-21 NOTE — Discharge Instructions (Signed)
recheck INR on 01/23/2017 with PCP Use oxygen especially with activity Take medications as prescribed Quit smoking

## 2017-01-21 NOTE — Discharge Summary (Signed)
Jaime Kelley, is a 66 y.o. female  DOB 08-29-1951  MRN [id number].  Admission date:  01/18/2017  Admitting Physician  Debbe Odea, MD  Discharge Date:  01/21/2017   Primary MD  [name], MD  Recommendations for primary care physician for things to follow:   INR recheck Admission Diagnosis  Hypoxemia [R09.02] Hypokalemia [E87.6] COPD exacerbation (HCC) [J44.1] Hypotension, unspecified hypotension type [I95.9]   Discharge Diagnosis  Hypoxemia [R09.02] Hypokalemia [E87.6] COPD exacerbation (HCC) [J44.1] Hypotension, unspecified hypotension type [I95.9]    Principal Problem:   Acute hypoxemic respiratory failure ([place]) Active Problems:   Hypothyroidism   TOBACCO ABUSE   COPD with emphysema (HCC)   GERD   Rheumatoid arthritis ([place])   HTN (hypertension)   Type 2 diabetes mellitus ([place])      Past Medical History:  Diagnosis Date  . COPD (chronic obstructive pulmonary disease) ([place])   . Discoid lupus    sees Dr. Allyson Sabal  . GERD (gastroesophageal reflux disease)   . History of colonic polyps   . Hx pulmonary embolism   . Hypothyroidism   . Rheumatoid arthritis(714.0)    sees Dr. Amil Amen   . Thyroid disease     Past Surgical History:  Procedure Laterality Date  . ABDOMINAL HYSTERECTOMY    . arthroscopy right knee    . carpal tunnel release left wrist    . 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 TONSILLECTOMY         HPI  from the history and physical done on the day of admission:    HPI: Jaime Kelley is a 66 y.o. female with medical history significant of COPD, PE, who smokes one half packs a day, rheumatoid arthritis. The patient had a cough last week which improved with Zithromax however she continues to be severely short of breath and "feels like she is dying. She is not eating much. She is extremely weak. No complaint of chest pain. Has felt feverish but that  was last week and has improved. It seems most of her symptoms improved however shortness of breath got worse. No sore throat, runny nose or headaches. EMS found her to have pulse ox in the 70s with wheezing. She was given albuterol Atrovent and Solu-Medrol by EMS.  ED Course: Pulse ox 95% on 4 L of oxygen. Blood pressure 82/58.  Sodium 127, potassium 2.6     Hospital Course:    1)Acute Hypoxemic Respiratory Failure- patient was not previously on home O2, suspect hypoxia is secondary to COPD with acute exacerbation, influenza test and respiratory panel arenegative,  Treated with IV Solu-Medrol, bronchodilators and doxycycline.   okay to discharge home on oxygen and prednisone  2)Coag Neg Staph Bacteremia- coag-negative staph bacteremia, one out of 2 bottles suspect contaminant. No leukocytosis and patient does not look toxic.Lactic acid was normal. No fever  Or chills   3)HypoNatremia- improved, HCTZ has been stopped, sodium is 132, avoid excessive free water intake  4)H/o PE-  INR was elevated, INR was  5.8, Restarted Coumadin , INR today is < 2, Recheck INR Post discharge  5)DM-  Resume home rx  6)Tobacco Use Disorder- smoking cessation strongly advised  7)Rheumatoid Arthritis- continue methotrexate every weekly, ??? Patient has some degree of immunosuppression due to long-standing use of immunosuppressants  8)HTN- BP is improved, c/n Amlodipine and Lisinopril,  Discharge Condition: Improved  Follow UP  Follow-up Information    [place] Follow up.   Why:  will deliver concentrator to your home. Home Health RN and Physical Therapy Contact information: [address] [place] [address] [phone]           Diet and Activity recommendation:  As advised  Discharge Instructions    Discharge Instructions    Call MD for:  difficulty breathing, headache or visual disturbances    Complete by:  As directed    Call MD for:  persistant  dizziness or light-headedness    Complete by:  As directed    Call MD for:  persistant nausea and vomiting    Complete by:  As directed    Call MD for:  redness, tenderness, or signs of infection (pain, swelling, redness, odor or green/yellow discharge around incision site)    Complete by:  As directed    Call MD for:  severe uncontrolled pain    Complete by:  As directed    Call MD for:  temperature >100.4    Complete by:  As directed    Diet - low sodium heart healthy    Complete by:  As directed    Discharge instructions    Complete by:  As directed    recheck INR on 01/23/2017 with PCP Use oxygen especially with activity Take medications as prescribed Quit smoking   Increase activity slowly    Complete by:  As directed        Discharge Medications    Allergies as of 01/21/2017      Reactions   Venlafaxine Rash   Effexor [venlafaxine Hydrochloride] Rash      Medication List    STOP taking these medications   hydrochlorothiazide 25 MG tablet Commonly known as:  HYDRODIURIL     TAKE these medications   albuterol 108 (90 Base) MCG/ACT inhaler Commonly known as:  PROAIR HFA Inhale 2 puffs into the lungs every 4 (four) hours as needed for wheezing.   amLODipine 10 MG tablet Commonly known as:  NORVASC TAKE 1 TABLET (10 MG TOTAL) BY MOUTH DAILY.   azithromycin 500 MG tablet Commonly known as:  ZITHROMAX Z-PAK 1 daily What changed:  medication strength  additional instructions   Cholecalciferol 1000 units capsule Take 1,000 Units by mouth daily. Vitamin D   folic acid 1 MG tablet Commonly known as:  FOLVITE Take 1 mg by mouth daily.   halobetasol 0.05 % cream Commonly known as:  ULTRAVATE Apply topically 2 (two) times daily.   HYDROcodone-acetaminophen 5-325 MG tablet Commonly known as:  NORCO/VICODIN TAKE 1 TABLET BY MOUTH EVERY 6 HOURS AS NEEDED FOR PAIN   ipratropium-albuterol 0.5-2.5 (3) MG/3ML Soln Commonly known as:  DUONEB Take 3 mLs by  nebulization 3 (three) times daily.   lisinopril 40 MG tablet Commonly known as:  PRINIVIL,ZESTRIL TAKE 1 TABLET BY MOUTH EVERY DAY   metFORMIN 500 MG tablet Commonly known as:  GLUCOPHAGE Take twice daily What changed:  how much to take  how to take this  when to take this  additional instructions   methotrexate 2.5 MG  tablet Commonly known as:  RHEUMATREX Take 15 mg by mouth once a week. Take 6 tablets. Caution:Chemotherapy. Protect from light.   nicotine 21 mg/24hr patch Commonly known as:  NICODERM CQ - dosed in mg/24 hours Place 1 patch (21 mg total) onto the skin daily. Start taking on:  01/22/2017   predniSONE 20 MG tablet Commonly known as:  DELTASONE Take 1 tablet (20 mg total) by mouth daily with breakfast. What changed:  when to take this  reasons to take this   sertraline 100 MG tablet Commonly known as:  ZOLOFT TAKE 1/2 TABLET BY MOUTH EVERY EVENING What changed:  Another medication with the same name was changed. Make sure you understand how and when to take each.   sertraline 100 MG tablet Commonly known as:  ZOLOFT TAKE 1 TABLET EVERY DAY What changed:  See the new instructions.   SYNTHROID 137 MCG tablet Generic drug:  levothyroxine TAKE 1 TABLET (137 MCG TOTAL) BY MOUTH DAILY BEFORE BREAKFAST.   warfarin 7.5 MG tablet Commonly known as:  COUMADIN 1 tablet every evening, recheck INR on 01/23/2017 with PCP What changed:  additional instructions  Another medication with the same name was removed. Continue taking this medication, and follow the directions you see here.            Durable Medical Equipment        Start     Ordered   01/21/17 1605  DME Oxygen  Once    Question Answer Comment  Mode or (Route) Nasal cannula   Liters per Minute 5   Frequency Continuous (stationary and portable oxygen unit needed)   Oxygen conserving device Yes   Oxygen delivery system Gas      01/21/17 1607      Major procedures and Radiology  Reports - PLEASE review detailed and final reports for all details, in brief -    Dg Chest Portable 1 View  Result Date: 01/18/2017 CLINICAL DATA:  Shortness of breath.  History of COPD. EXAM: PORTABLE CHEST 1 VIEW COMPARISON:  Chest radiograph Mar 03, 2014 FINDINGS: Cardiomediastinal silhouette is normal. Mildly calcified aortic knob. No pleural effusions or focal consolidations. Hyperinflation mild chronic interstitial changes. Trachea projects midline and there is no pneumothorax. Soft tissue planes and included osseous structures are non-suspicious. IMPRESSION: COPD, no acute cardiopulmonary process. Electronically Signed   By: Elon Alas M.D.   On: 01/18/2017 15:23    Micro Results   Recent Results (from the past 240 hour(s))  Culture, blood (routine x 2)     Status: Abnormal   Collection Time: 01/18/17  3:07 PM  Result Value Ref Range Status   Specimen Description BLOOD RIGHT ANTECUBITAL  Final   Special Requests BOTTLES DRAWN AEROBIC AND ANAEROBIC 5CC  Final   Culture  Setup Time   Final    GRAM POSITIVE COCCI IN CLUSTERS AEROBIC BOTTLE ONLY CRITICAL RESULT CALLED TO, READ BACK BY AND VERIFIED WITH: A. Runyon Pharm.D. 11:30 01/19/17 ([email or web address])    Culture (A)  Final    STAPHYLOCOCCUS SPECIES (COAGULASE NEGATIVE) THE SIGNIFICANCE OF ISOLATING THIS ORGANISM FROM A SINGLE SET OF BLOOD CULTURES WHEN MULTIPLE SETS ARE DRAWN IS UNCERTAIN. PLEASE NOTIFY THE MICROBIOLOGY DEPARTMENT WITHIN ONE WEEK IF SPECIATION AND SENSITIVITIES ARE REQUIRED. Performed at [facility], [place] [address] [place],  [address]    Report Status 01/21/2017 FINAL  Final  Blood Culture ID Panel (Reflexed)     Status: Abnormal   Collection Time: 01/18/17  3:07  PM  Result Value Ref Range Status   Enterococcus species NOT DETECTED NOT DETECTED Final   Listeria monocytogenes NOT DETECTED NOT DETECTED Final   Staphylococcus species DETECTED (A) NOT DETECTED Final    Comment: Methicillin (oxacillin)  resistant coagulase negative staphylococcus. Possible blood culture contaminant (unless isolated from more than one blood culture draw or clinical case suggests pathogenicity). No antibiotic treatment is indicated for blood  culture contaminants. CRITICAL RESULT CALLED TO, READ BACK BY AND VERIFIED WITH: A. Runyon Pharm.D. 11:30 01/19/17 (wilsonm)    Staphylococcus aureus NOT DETECTED NOT DETECTED Final   Methicillin resistance DETECTED (A) NOT DETECTED Final    Comment: CRITICAL RESULT CALLED TO, READ BACK BY AND VERIFIED WITH: A. Runyon Pharm.D. 11:30 01/19/17 (wilsonm)    Streptococcus species NOT DETECTED NOT DETECTED Final   Streptococcus agalactiae NOT DETECTED NOT DETECTED Final   Streptococcus pneumoniae NOT DETECTED NOT DETECTED Final   Streptococcus pyogenes NOT DETECTED NOT DETECTED Final   Acinetobacter baumannii NOT DETECTED NOT DETECTED Final   Enterobacteriaceae species NOT DETECTED NOT DETECTED Final   Enterobacter cloacae complex NOT DETECTED NOT DETECTED Final   Escherichia coli NOT DETECTED NOT DETECTED Final   Klebsiella oxytoca NOT DETECTED NOT DETECTED Final   Klebsiella pneumoniae NOT DETECTED NOT DETECTED Final   Proteus species NOT DETECTED NOT DETECTED Final   Serratia marcescens NOT DETECTED NOT DETECTED Final   Haemophilus influenzae NOT DETECTED NOT DETECTED Final   Neisseria meningitidis NOT DETECTED NOT DETECTED Final   Pseudomonas aeruginosa NOT DETECTED NOT DETECTED Final   Candida albicans NOT DETECTED NOT DETECTED Final   Candida glabrata NOT DETECTED NOT DETECTED Final   Candida krusei NOT DETECTED NOT DETECTED Final   Candida parapsilosis NOT DETECTED NOT DETECTED Final   Candida tropicalis NOT DETECTED NOT DETECTED Final    Comment: Performed at [facility], [place] [address] [place], [place] [address]  Culture, blood (routine x 2)     Status: None (Preliminary result)   Collection Time: 01/18/17  3:20 PM  Result Value Ref Range Status    Specimen Description BLOOD LEFT HAND  Final   Special Requests BOTTLES DRAWN AEROBIC AND ANAEROBIC 5CC  Final   Culture   Final    NO GROWTH 3 DAYS Performed at [facility], [place] [address], [place], [place] [address]    Report Status PENDING  Incomplete  Respiratory Panel by PCR     Status: None   Collection Time: 01/18/17  7:39 PM  Result Value Ref Range Status   Adenovirus NOT DETECTED NOT DETECTED Final   Coronavirus 229E NOT DETECTED NOT DETECTED Final   Coronavirus HKU1 NOT DETECTED NOT DETECTED Final   Coronavirus NL63 NOT DETECTED NOT DETECTED Final   Coronavirus OC43 NOT DETECTED NOT DETECTED Final   Metapneumovirus NOT DETECTED NOT DETECTED Final   Rhinovirus / Enterovirus NOT DETECTED NOT DETECTED Final   Influenza A NOT DETECTED NOT DETECTED Final   Influenza B NOT DETECTED NOT DETECTED Final   Parainfluenza Virus 1 NOT DETECTED NOT DETECTED Final   Parainfluenza Virus 2 NOT DETECTED NOT DETECTED Final   Parainfluenza Virus 3 NOT DETECTED NOT DETECTED Final   Parainfluenza Virus 4 NOT DETECTED NOT DETECTED Final   Respiratory Syncytial Virus NOT DETECTED NOT DETECTED Final   Bordetella pertussis NOT DETECTED NOT DETECTED Final   Chlamydophila pneumoniae NOT DETECTED NOT DETECTED Final   Mycoplasma pneumoniae NOT DETECTED NOT DETECTED Final    Comment: Performed at [facility], [place] [address], [place], [place] [address]  MRSA PCR Screening     Status: None   Collection Time: 01/18/17 10:41 PM  Result Value Ref Range Status   MRSA by PCR NEGATIVE NEGATIVE Final    Comment:        The GeneXpert MRSA Assay (FDA approved for NASAL specimens only), is one component of a comprehensive MRSA colonization surveillance program. It is not intended to diagnose MRSA infection nor to guide or monitor treatment for MRSA infections. Performed at [facility], [place] [address] [place], [place] [address]        Today   Subjective    Jaime Kelley  today has no new complaints, cough and  shortness of breath has improved          Patient has been seen and examined prior to discharge   Objective   Blood pressure 138/81, pulse 96, temperature 98 F (36.7 C), temperature source Oral, resp. rate 18, height 5\' 2"  (1.575 m), weight 92.4 kg (203 lb 11.3 oz), SpO2 94 %.   Intake/Output Summary (Last 24 hours) at 01/21/17 1745 Last data filed at 01/21/17 1444  Gross per 24 hour  Intake             3290 ml  Output              850 ml  Net             2440 ml    Exam Gen:- Awake  In no apparent distress  HEENT:- [place].AT,   Nose- [place] at 3L/min Neck-Supple Neck,No JVD,  Lungs- overall improved air movement, few scattered wheezes, CV- S1, S2 normal Abd-  +ve B.Sounds, Abd Soft, No tenderness,    Extremity/Skin:- Intact peripheral pulses     Data Review   CBC w Diff:  Lab Results  Component Value Date   WBC 6.1 01/19/2017   HGB 14.2 01/19/2017   HCT 41.1 01/19/2017   PLT 285 01/19/2017   LYMPHOPCT 23 01/18/2017   MONOPCT 8 01/18/2017   EOSPCT 0 01/18/2017   BASOPCT 0 01/18/2017    CMP:  Lab Results  Component Value Date   NA 132 (L) 01/20/2017   K 4.3 01/20/2017   CL 95 (L) 01/20/2017   CO2 30 01/20/2017   BUN 15 01/20/2017   CREATININE 0.65 01/20/2017   PROT 6.6 01/18/2017   ALBUMIN 3.4 (L) 01/18/2017   BILITOT 0.4 01/18/2017   ALKPHOS 52 01/18/2017   AST 21 01/18/2017   ALT 17 01/18/2017   Lab Results  Component Value Date   INR 1.69 01/21/2017   INR 3.70 01/20/2017   INR 5.09 (HH) 01/19/2017     Total Discharge time is about 33 minutes  Jaime Kelley M.D on 01/21/2017 at 5:45 PM  [facility]   Office  [phone]  Dragon dictation system was used to create this note, attempts have been made to correct errors, however presence of uncorrected errors is not a reflection quality of care provided

## 2017-01-21 NOTE — Progress Notes (Signed)
[place] for warfarin Indication: DVT  Allergies  Allergen Reactions  . Venlafaxine Rash  . Effexor [Venlafaxine Hydrochloride] Rash   Patient Measurements: Height: 5\' 2"  (157.5 cm) Weight: 203 lb 11.3 oz (92.4 kg) IBW/kg (Calculated) : 50.1  Vital Signs: Temp: 97.8 F (36.6 C) (04/01 0519) Temp Source: Oral (04/01 0519) BP: 153/91 (04/01 0519) Pulse Rate: 85 (04/01 0519)  Labs:  Recent Labs  01/18/17 1502  01/18/17 1622  01/19/17 0312 01/20/17 0329 01/21/17 0513  HGB 9.2*  --  15.0  --  14.2  --   --   HCT 26.6*  --  44.0  --  41.1  --   --   PLT 163  --   --   --  285  --   --   LABPROT  --   < >  --   < > 48.6* 37.6* 20.1*  INR  --   < >  --   < > 5.09* 3.70 1.69  CREATININE  --   < > 0.90  --  0.69 0.65  --   < > = values in this interval not displayed. Estimated Creatinine Clearance: 73.2 mL/min (by C-G formula based on SCr of 0.65 mg/dL).  Medications:  Scheduled:  . amLODipine  10 mg Oral Daily  . doxycycline  100 mg Oral Q12H  . insulin aspart  0-15 Units Subcutaneous TID WC  . insulin aspart  0-5 Units Subcutaneous QHS  . ipratropium-albuterol  3 mL Nebulization TID  . levothyroxine  137 mcg Oral QAC breakfast  . lisinopril  10 mg Oral Daily  . mouth rinse  15 mL Mouth Rinse BID  . methylPREDNISolone (SOLU-MEDROL) injection  40 mg Intravenous Q12H  . nicotine  21 mg Transdermal Daily  . sodium chloride flush  3 mL Intravenous Q12H  . Warfarin - Pharmacist Dosing Inpatient   Does not apply q1800   Assessment: 58 yoF to ER 3/29 with shortness of breath, hx COPD and Tobacco use. Hx of PE on chronic Warfarin: 7.5mg  MWF, 10mg  other days - last dose 3/28  Patient started course of Azithromycin 3/23 - likely cause of supratherapeutic INR on admit  Today, 01/21/17  INR now subtherapeutic at 1.69   Doxycycline may also increase INR  No reported bleeding  CBC wnl (3/30), po intake appears good  Goal of  Therapy:  INR 2-3 Monitor CBC    Plan:   Warfarin 7.5mg  x 1 at 1800  Daily Protime/INR  Dolly Rias RPh 01/21/2017, 8:05 AM Pager [phone]

## 2017-01-22 ENCOUNTER — Other Ambulatory Visit: Payer: Self-pay | Admitting: Surgery

## 2017-01-22 ENCOUNTER — Other Ambulatory Visit: Payer: Self-pay | Admitting: Family Medicine

## 2017-01-22 ENCOUNTER — Telehealth: Payer: Self-pay | Admitting: Surgery

## 2017-01-22 DIAGNOSIS — J209 Acute bronchitis, unspecified: Secondary | ICD-10-CM

## 2017-01-22 DIAGNOSIS — J440 Chronic obstructive pulmonary disease with acute lower respiratory infection: Principal | ICD-10-CM

## 2017-01-22 NOTE — Telephone Encounter (Signed)
° ° ° ° °  Pt returned your call said she was in the hospital and if you need to speak with her can give her a call

## 2017-01-22 NOTE — Progress Notes (Signed)
Home DME neb  Rx

## 2017-01-22 NOTE — Telephone Encounter (Addendum)
ED CM received call from Jaime Kelley in the [place] office. She stated she received call from patient regarding not receiving home neb machine upon discharge. Patient received prescription for albuterol solution on discharge and does not have a machine at home.  CM contacted Dr. Denton Brick order obtained referral faxed to [facility] 336 [phone]. CM contacted patient who will contact the [facility] store and arrange for pick up tomorrow.  Patient verbalized having difficulty affording monthly medications. Discussed [facility] Management service patient may benefit form the services, patient agreeable referral place. Patient states she currently has all of her prescribed medications  Patient given contact information for [facility] CM services.  No further ED CM needs identified.

## 2017-01-23 ENCOUNTER — Ambulatory Visit (INDEPENDENT_AMBULATORY_CARE_PROVIDER_SITE_OTHER): Payer: Medicare Other | Admitting: General Practice

## 2017-01-23 ENCOUNTER — Telehealth: Payer: Self-pay | Admitting: General Practice

## 2017-01-23 ENCOUNTER — Telehealth: Payer: Self-pay | Admitting: Family Medicine

## 2017-01-23 DIAGNOSIS — Z9981 Dependence on supplemental oxygen: Secondary | ICD-10-CM | POA: Diagnosis not present

## 2017-01-23 DIAGNOSIS — Z720 Tobacco use: Secondary | ICD-10-CM | POA: Diagnosis not present

## 2017-01-23 DIAGNOSIS — E119 Type 2 diabetes mellitus without complications: Secondary | ICD-10-CM | POA: Diagnosis not present

## 2017-01-23 DIAGNOSIS — Z5181 Encounter for therapeutic drug level monitoring: Secondary | ICD-10-CM

## 2017-01-23 DIAGNOSIS — L930 Discoid lupus erythematosus: Secondary | ICD-10-CM | POA: Diagnosis not present

## 2017-01-23 DIAGNOSIS — K219 Gastro-esophageal reflux disease without esophagitis: Secondary | ICD-10-CM | POA: Diagnosis not present

## 2017-01-23 DIAGNOSIS — E039 Hypothyroidism, unspecified: Secondary | ICD-10-CM | POA: Diagnosis not present

## 2017-01-23 DIAGNOSIS — Z7984 Long term (current) use of oral hypoglycemic drugs: Secondary | ICD-10-CM | POA: Diagnosis not present

## 2017-01-23 DIAGNOSIS — E669 Obesity, unspecified: Secondary | ICD-10-CM | POA: Diagnosis not present

## 2017-01-23 DIAGNOSIS — M069 Rheumatoid arthritis, unspecified: Secondary | ICD-10-CM | POA: Diagnosis not present

## 2017-01-23 DIAGNOSIS — J449 Chronic obstructive pulmonary disease, unspecified: Secondary | ICD-10-CM | POA: Diagnosis not present

## 2017-01-23 DIAGNOSIS — Z86711 Personal history of pulmonary embolism: Secondary | ICD-10-CM | POA: Diagnosis not present

## 2017-01-23 LAB — CULTURE, BLOOD (ROUTINE X 2): CULTURE: NO GROWTH

## 2017-01-23 LAB — POCT INR: INR: 1.9

## 2017-01-23 NOTE — Telephone Encounter (Signed)
° ° ° ° °  Phone number for [facility]

## 2017-01-23 NOTE — Telephone Encounter (Signed)
° ° ° ° °  [place] with [facility] call to ask if they should be doing the pt PTR . Would like a call back

## 2017-01-23 NOTE — Telephone Encounter (Signed)
Have them do that an each nursing visit

## 2017-01-23 NOTE — Telephone Encounter (Signed)
Received call from Patient stating that she has been in the hospital.  She states that [facility] will be coming for a visit today and asked me to fax in an order to check INR.  Order faxed to [facility] today, 4/3 @ 11:15.

## 2017-01-23 NOTE — Patient Instructions (Signed)
Pre visit review using our clinic review tool, if applicable. No additional management support is needed unless otherwise documented below in the visit note. 

## 2017-01-24 ENCOUNTER — Ambulatory Visit: Payer: Medicare Other

## 2017-01-24 NOTE — Telephone Encounter (Signed)
Pt was on schedule for 01/23/2017 but didn't make visit, when does she need to reschedule for? Looks like she was in hospital recently.

## 2017-01-24 NOTE — Telephone Encounter (Signed)
Home health RN called me yesterday with patient's INR.  I gave her new dosing instructions and asked her to re-check patient at next visit and report to me directly.

## 2017-01-24 NOTE — Telephone Encounter (Signed)
I spoke with Jaime Kelley and pt will get done here in office, usually once a month.

## 2017-01-25 ENCOUNTER — Other Ambulatory Visit: Payer: Self-pay

## 2017-01-25 DIAGNOSIS — M069 Rheumatoid arthritis, unspecified: Secondary | ICD-10-CM | POA: Diagnosis not present

## 2017-01-25 DIAGNOSIS — L930 Discoid lupus erythematosus: Secondary | ICD-10-CM | POA: Diagnosis not present

## 2017-01-25 DIAGNOSIS — E119 Type 2 diabetes mellitus without complications: Secondary | ICD-10-CM | POA: Diagnosis not present

## 2017-01-25 DIAGNOSIS — J449 Chronic obstructive pulmonary disease, unspecified: Secondary | ICD-10-CM | POA: Diagnosis not present

## 2017-01-25 DIAGNOSIS — E039 Hypothyroidism, unspecified: Secondary | ICD-10-CM | POA: Diagnosis not present

## 2017-01-25 DIAGNOSIS — K219 Gastro-esophageal reflux disease without esophagitis: Secondary | ICD-10-CM | POA: Diagnosis not present

## 2017-01-25 MED ORDER — ALBUTEROL SULFATE HFA 108 (90 BASE) MCG/ACT IN AERS: 2.0000 | INHALATION_SPRAY | RESPIRATORY_TRACT | 11 refills | Status: DC | PRN

## 2017-01-27 DIAGNOSIS — E039 Hypothyroidism, unspecified: Secondary | ICD-10-CM | POA: Diagnosis not present

## 2017-01-27 DIAGNOSIS — L930 Discoid lupus erythematosus: Secondary | ICD-10-CM | POA: Diagnosis not present

## 2017-01-27 DIAGNOSIS — J449 Chronic obstructive pulmonary disease, unspecified: Secondary | ICD-10-CM | POA: Diagnosis not present

## 2017-01-27 DIAGNOSIS — M069 Rheumatoid arthritis, unspecified: Secondary | ICD-10-CM | POA: Diagnosis not present

## 2017-01-27 DIAGNOSIS — K219 Gastro-esophageal reflux disease without esophagitis: Secondary | ICD-10-CM | POA: Diagnosis not present

## 2017-01-27 DIAGNOSIS — E119 Type 2 diabetes mellitus without complications: Secondary | ICD-10-CM | POA: Diagnosis not present

## 2017-01-28 DIAGNOSIS — E119 Type 2 diabetes mellitus without complications: Secondary | ICD-10-CM | POA: Diagnosis not present

## 2017-01-28 DIAGNOSIS — J449 Chronic obstructive pulmonary disease, unspecified: Secondary | ICD-10-CM | POA: Diagnosis not present

## 2017-01-28 DIAGNOSIS — K219 Gastro-esophageal reflux disease without esophagitis: Secondary | ICD-10-CM | POA: Diagnosis not present

## 2017-01-28 DIAGNOSIS — E039 Hypothyroidism, unspecified: Secondary | ICD-10-CM | POA: Diagnosis not present

## 2017-01-28 DIAGNOSIS — L930 Discoid lupus erythematosus: Secondary | ICD-10-CM | POA: Diagnosis not present

## 2017-01-28 DIAGNOSIS — M069 Rheumatoid arthritis, unspecified: Secondary | ICD-10-CM | POA: Diagnosis not present

## 2017-01-29 ENCOUNTER — Telehealth: Payer: Self-pay | Admitting: Family Medicine

## 2017-01-29 ENCOUNTER — Other Ambulatory Visit: Payer: Self-pay | Admitting: Family Medicine

## 2017-01-29 ENCOUNTER — Other Ambulatory Visit: Payer: Self-pay | Admitting: General Practice

## 2017-01-29 MED ORDER — WARFARIN SODIUM 10 MG PO TABS: ORAL_TABLET | ORAL | 0 refills | Status: DC

## 2017-01-29 NOTE — Telephone Encounter (Signed)
Flonnie Hailstone from [facility] called for verbal orders  Verbal Orders: 2 times a week for 2 week, 1 time a week for 1 week to address endurance and energy conservation with ADLs  Contact Info: Flonnie Hailstone [phone]

## 2017-01-30 ENCOUNTER — Ambulatory Visit (INDEPENDENT_AMBULATORY_CARE_PROVIDER_SITE_OTHER): Payer: Medicare Other | Admitting: General Practice

## 2017-01-30 DIAGNOSIS — E039 Hypothyroidism, unspecified: Secondary | ICD-10-CM | POA: Diagnosis not present

## 2017-01-30 DIAGNOSIS — K219 Gastro-esophageal reflux disease without esophagitis: Secondary | ICD-10-CM | POA: Diagnosis not present

## 2017-01-30 DIAGNOSIS — L930 Discoid lupus erythematosus: Secondary | ICD-10-CM | POA: Diagnosis not present

## 2017-01-30 DIAGNOSIS — J449 Chronic obstructive pulmonary disease, unspecified: Secondary | ICD-10-CM | POA: Diagnosis not present

## 2017-01-30 DIAGNOSIS — E119 Type 2 diabetes mellitus without complications: Secondary | ICD-10-CM | POA: Diagnosis not present

## 2017-01-30 DIAGNOSIS — M069 Rheumatoid arthritis, unspecified: Secondary | ICD-10-CM | POA: Diagnosis not present

## 2017-01-30 DIAGNOSIS — Z5181 Encounter for therapeutic drug level monitoring: Secondary | ICD-10-CM

## 2017-01-30 LAB — POCT INR: INR: 5.3

## 2017-01-30 NOTE — Patient Instructions (Signed)
Pre visit review using our clinic review tool, if applicable. No additional management support is needed unless otherwise documented below in the visit note. 

## 2017-01-30 NOTE — Telephone Encounter (Signed)
Per Dr. Sarajane Jews okay and I did speak with Remo Lipps and gave verbal.

## 2017-02-02 ENCOUNTER — Ambulatory Visit (INDEPENDENT_AMBULATORY_CARE_PROVIDER_SITE_OTHER): Payer: Medicare Other | Admitting: General Practice

## 2017-02-02 DIAGNOSIS — E119 Type 2 diabetes mellitus without complications: Secondary | ICD-10-CM | POA: Diagnosis not present

## 2017-02-02 DIAGNOSIS — K219 Gastro-esophageal reflux disease without esophagitis: Secondary | ICD-10-CM | POA: Diagnosis not present

## 2017-02-02 DIAGNOSIS — M069 Rheumatoid arthritis, unspecified: Secondary | ICD-10-CM | POA: Diagnosis not present

## 2017-02-02 DIAGNOSIS — L930 Discoid lupus erythematosus: Secondary | ICD-10-CM | POA: Diagnosis not present

## 2017-02-02 DIAGNOSIS — E039 Hypothyroidism, unspecified: Secondary | ICD-10-CM | POA: Diagnosis not present

## 2017-02-02 DIAGNOSIS — Z5181 Encounter for therapeutic drug level monitoring: Secondary | ICD-10-CM

## 2017-02-02 DIAGNOSIS — J449 Chronic obstructive pulmonary disease, unspecified: Secondary | ICD-10-CM | POA: Diagnosis not present

## 2017-02-02 NOTE — Progress Notes (Signed)
I have reviewed and agree with the plan. 

## 2017-02-02 NOTE — Patient Instructions (Signed)
Pre visit review using our clinic review tool, if applicable. No additional management support is needed unless otherwise documented below in the visit note. 

## 2017-02-05 ENCOUNTER — Telehealth: Payer: Self-pay | Admitting: Family Medicine

## 2017-02-05 NOTE — Telephone Encounter (Signed)
noted 

## 2017-02-05 NOTE — Telephone Encounter (Signed)
° ° ° °  Pt call to say she refused physical therapy with [facility]

## 2017-02-06 ENCOUNTER — Ambulatory Visit (INDEPENDENT_AMBULATORY_CARE_PROVIDER_SITE_OTHER): Payer: Medicare Other | Admitting: General Practice

## 2017-02-06 ENCOUNTER — Telehealth: Payer: Self-pay | Admitting: *Deleted

## 2017-02-06 DIAGNOSIS — J449 Chronic obstructive pulmonary disease, unspecified: Secondary | ICD-10-CM | POA: Diagnosis not present

## 2017-02-06 DIAGNOSIS — E039 Hypothyroidism, unspecified: Secondary | ICD-10-CM | POA: Diagnosis not present

## 2017-02-06 DIAGNOSIS — M069 Rheumatoid arthritis, unspecified: Secondary | ICD-10-CM | POA: Diagnosis not present

## 2017-02-06 DIAGNOSIS — L930 Discoid lupus erythematosus: Secondary | ICD-10-CM | POA: Diagnosis not present

## 2017-02-06 DIAGNOSIS — E119 Type 2 diabetes mellitus without complications: Secondary | ICD-10-CM | POA: Diagnosis not present

## 2017-02-06 DIAGNOSIS — Z5181 Encounter for therapeutic drug level monitoring: Secondary | ICD-10-CM

## 2017-02-06 DIAGNOSIS — K219 Gastro-esophageal reflux disease without esophagitis: Secondary | ICD-10-CM | POA: Diagnosis not present

## 2017-02-06 LAB — POCT INR: INR: 1.9

## 2017-02-06 NOTE — Patient Instructions (Signed)
Pre visit review using our clinic review tool, if applicable. No additional management support is needed unless otherwise documented below in the visit note. 

## 2017-02-06 NOTE — Telephone Encounter (Signed)
Jenny Reichmann from [facility] [phone]) called stating she cannot reach anyone at the [address] office and called here.  She wanted to let Jenny Reichmann know the PT/INR results from today is 1.9.  Message sent to [facility].

## 2017-02-06 NOTE — Telephone Encounter (Signed)
Noted  

## 2017-02-06 NOTE — Progress Notes (Signed)
I have reviewed and agree with the plan. 

## 2017-02-06 NOTE — Telephone Encounter (Signed)
Our phones are not working.  I will use my cell phone to call the nurse.

## 2017-02-12 ENCOUNTER — Encounter: Payer: Self-pay | Admitting: *Deleted

## 2017-02-12 ENCOUNTER — Other Ambulatory Visit: Payer: Self-pay | Admitting: *Deleted

## 2017-02-12 NOTE — Patient Outreach (Signed)
Jaime Kelley [facility]) Care Management  02/12/2017  Jaime Kelley 15-Sep-1951 [id number]   RN received a referral on 4/23  RN contacted pt today and explained the [facility] services, available programs and introduced the purpose for today's call. Pt very receptive to the call and indicated she is currently active with [facility] for INR check's with a RN and PT/OT services. Pt states she is doing well with all other medical conditions (COPD/Pneumonia,Hypoxemia, Hypotension) stating all are "normal" with no reported problems or issues.  Reports her next follow up appointment is tomorrow but with the pending weather pt states she may request a visit today instead of cancelling for tomorrow and rescheduling. Pt state will need her Vicodin to be refilled and will inform her provider with this request for today. RN confirmed pt's current medications and pt updated RN that she home oxygen set at 3 liters and continues to breath well. RN explained case management services and offered pt home visits that would not interfer with the current [facility] services. RN also offered telephonic services for disease management however pt also declined this services. Pt appreciative but opt to decline due to her current involvement with [facility]. RN offered additional information and educated pt on COPD action plan and what to do if acute. Also verified pt remains int he GREEN zone today with no symptoms presented. Pt again express how grateful she was for the information and CM provided name and agency contact number if needed in the further for services. Case will be closed based upon pt's decline for [facility] services.   Patient was recently discharged from hospital and all medications have been rev  [name], RN Care Management Coordinator [place] Office [phone].

## 2017-02-13 ENCOUNTER — Ambulatory Visit (INDEPENDENT_AMBULATORY_CARE_PROVIDER_SITE_OTHER): Payer: Medicare Other | Admitting: Family Medicine

## 2017-02-13 ENCOUNTER — Encounter: Payer: Self-pay | Admitting: Family Medicine

## 2017-02-13 VITALS — BP 127/86 | HR 100 | Temp 98.7°F | Ht 62.0 in | Wt 215.0 lb

## 2017-02-13 DIAGNOSIS — E871 Hypo-osmolality and hyponatremia: Secondary | ICD-10-CM | POA: Diagnosis not present

## 2017-02-13 DIAGNOSIS — J439 Emphysema, unspecified: Secondary | ICD-10-CM

## 2017-02-13 DIAGNOSIS — E876 Hypokalemia: Secondary | ICD-10-CM

## 2017-02-13 DIAGNOSIS — I10 Essential (primary) hypertension: Secondary | ICD-10-CM

## 2017-02-13 LAB — BASIC METABOLIC PANEL
BUN: 7 mg/dL (ref 6–23)
CHLORIDE: 99 meq/L (ref 96–112)
CO2: 31 mEq/L (ref 19–32)
CREATININE: 0.71 mg/dL (ref 0.40–1.20)
Calcium: 9.8 mg/dL (ref 8.4–10.5)
GFR: 87.41 mL/min (ref >60.00)
Glucose, Bld: 88 mg/dL (ref 70–99)
Potassium: 4.4 mEq/L (ref 3.5–5.1)
Sodium: 137 mEq/L (ref 135–145)

## 2017-02-13 MED ORDER — HYDROCODONE-ACETAMINOPHEN 5-325 MG PO TABS: ORAL_TABLET | ORAL | 0 refills | Status: DC

## 2017-02-13 MED ORDER — LISINOPRIL 40 MG PO TABS: 40.0000 mg | ORAL_TABLET | Freq: Every day | ORAL | 3 refills | Status: DC

## 2017-02-13 NOTE — Patient Instructions (Signed)
WE NOW OFFER   [facility] FAST TRACK!!!  SAME DAY Appointments for ACUTE CARE  Such as: Sprains, Injuries, cuts, abrasions, rashes, muscle pain, joint pain, back pain Colds, flu, sore throats, headache, allergies, cough, fever  Ear pain, sinus and eye infections Abdominal pain, nausea, vomiting, diarrhea, upset stomach Animal/insect bites  3 Easy Ways to Schedule: Walk-In Scheduling Call in scheduling Mychart Sign-up: [email or web address]         

## 2017-02-13 NOTE — Progress Notes (Signed)
   Subjective:    Patient ID: Jaime Kelley, female    DOB: May 21, 1951, 66 y.o.   MRN: [id number]  HPI Here to follow up a hospital stay from 01-18-17 to 01-21-17 for an acute exacerbation of COPD. No infectious etiology was found. Her admission RA O2 sats were in the 70s.  CXR showed COPD only. She was treated with IV steroids and nebulizations. She was started on continuous oxygen, which she is still wearing. Her admission sodium was low at 128 and this was up to 132 by DC. She was taken off HCTZ. Her admission potassium was low at 2.6, and this was up to 4.3 by DC. Today she feels back to her baseline. She gets mildly SOB on exertion but is comfortable at rest. She has totally quit smoking and is wearing a nicotine patch.    Review of Systems  Constitutional: Negative.   Respiratory: Positive for shortness of breath. Negative for cough, choking, chest tightness and wheezing.   Cardiovascular: Negative.   Gastrointestinal: Negative.   Neurological: Negative.        Objective:   Physical Exam  Constitutional: She is oriented to person, place, and time. She appears well-developed and well-nourished.  Looks comfortable. Wearing O2 at 2.5 liters. Using a walker.   Neck: No thyromegaly present.  Cardiovascular: Normal rate, regular rhythm, normal heart sounds and intact distal pulses.   Pulmonary/Chest: Effort normal and breath sounds normal. No respiratory distress. She has no wheezes. She has no rales.  Lymphadenopathy:    She has no cervical adenopathy.  Neurological: She is alert and oriented to person, place, and time.          Assessment & Plan:  She is recovering from a COPD exacerbation and is doing well. She is wearing oxygen at all times. She has quit smoking and is wearing patches. We will get a BMET today to follow the sodium and potassium. Her TN is stable.  [name], MD

## 2017-02-13 NOTE — Progress Notes (Signed)
Pre visit review using our clinic review tool, if applicable. No additional management support is needed unless otherwise documented below in the visit note. 

## 2017-02-15 ENCOUNTER — Ambulatory Visit (INDEPENDENT_AMBULATORY_CARE_PROVIDER_SITE_OTHER): Payer: Medicare Other | Admitting: General Practice

## 2017-02-15 ENCOUNTER — Telehealth: Payer: Self-pay | Admitting: Family Medicine

## 2017-02-15 DIAGNOSIS — K219 Gastro-esophageal reflux disease without esophagitis: Secondary | ICD-10-CM | POA: Diagnosis not present

## 2017-02-15 DIAGNOSIS — Z5181 Encounter for therapeutic drug level monitoring: Secondary | ICD-10-CM

## 2017-02-15 DIAGNOSIS — M069 Rheumatoid arthritis, unspecified: Secondary | ICD-10-CM | POA: Diagnosis not present

## 2017-02-15 DIAGNOSIS — L930 Discoid lupus erythematosus: Secondary | ICD-10-CM | POA: Diagnosis not present

## 2017-02-15 DIAGNOSIS — E039 Hypothyroidism, unspecified: Secondary | ICD-10-CM | POA: Diagnosis not present

## 2017-02-15 DIAGNOSIS — J449 Chronic obstructive pulmonary disease, unspecified: Secondary | ICD-10-CM | POA: Diagnosis not present

## 2017-02-15 DIAGNOSIS — E119 Type 2 diabetes mellitus without complications: Secondary | ICD-10-CM | POA: Diagnosis not present

## 2017-02-15 LAB — POCT INR: INR: 3.2

## 2017-02-15 MED ORDER — DOXYCYCLINE HYCLATE 100 MG PO TABS: 100.0000 mg | ORAL_TABLET | Freq: Two times a day (BID) | ORAL | 0 refills | Status: DC

## 2017-02-15 NOTE — Patient Instructions (Signed)
Pre visit review using our clinic review tool, if applicable. No additional management support is needed unless otherwise documented below in the visit note. 

## 2017-02-15 NOTE — Telephone Encounter (Signed)
I sent script e-scribe to [facility], spoke with Jenny Reichmann and she notify pt that script was sent in.

## 2017-02-15 NOTE — Telephone Encounter (Signed)
Just to female sure this isn't some cellulitis starting, call in Doxycycline 100 mg bid for 10 days

## 2017-02-15 NOTE — Progress Notes (Signed)
I have reviewed and agree with the plan. 

## 2017-02-15 NOTE — Telephone Encounter (Signed)
Jenny Reichmann from [facility] called stating that the patient has an bite on her posterior leg 1 x1and a rash around it has some itching. Patient noticed it on Tuesday night or Wednesday morning. Patient also has a little pitty edema, she thinks it is from the bite. Patient has been using neosporin and halobetasol cream 0.5%. Patient also has chronic red/pink color on the bottom of her feet and flaky skin. Jenny Reichmann would like to know what MD thinks.  Contact Info: Jenny Reichmann [phone]

## 2017-02-19 ENCOUNTER — Ambulatory Visit (INDEPENDENT_AMBULATORY_CARE_PROVIDER_SITE_OTHER): Payer: Medicare Other | Admitting: General Practice

## 2017-02-19 ENCOUNTER — Other Ambulatory Visit: Payer: Self-pay | Admitting: Family Medicine

## 2017-02-19 DIAGNOSIS — L930 Discoid lupus erythematosus: Secondary | ICD-10-CM | POA: Diagnosis not present

## 2017-02-19 DIAGNOSIS — Z5181 Encounter for therapeutic drug level monitoring: Secondary | ICD-10-CM

## 2017-02-19 DIAGNOSIS — E119 Type 2 diabetes mellitus without complications: Secondary | ICD-10-CM | POA: Diagnosis not present

## 2017-02-19 DIAGNOSIS — E039 Hypothyroidism, unspecified: Secondary | ICD-10-CM | POA: Diagnosis not present

## 2017-02-19 DIAGNOSIS — M069 Rheumatoid arthritis, unspecified: Secondary | ICD-10-CM | POA: Diagnosis not present

## 2017-02-19 DIAGNOSIS — K219 Gastro-esophageal reflux disease without esophagitis: Secondary | ICD-10-CM | POA: Diagnosis not present

## 2017-02-19 DIAGNOSIS — J449 Chronic obstructive pulmonary disease, unspecified: Secondary | ICD-10-CM | POA: Diagnosis not present

## 2017-02-19 LAB — POCT INR: INR: 3.2

## 2017-02-19 NOTE — Patient Instructions (Signed)
Pre visit review using our clinic review tool, if applicable. No additional management support is needed unless otherwise documented below in the visit note. 

## 2017-02-26 ENCOUNTER — Ambulatory Visit (INDEPENDENT_AMBULATORY_CARE_PROVIDER_SITE_OTHER): Payer: Medicare Other | Admitting: General Practice

## 2017-02-26 ENCOUNTER — Telehealth: Payer: Self-pay | Admitting: Family Medicine

## 2017-02-26 DIAGNOSIS — E119 Type 2 diabetes mellitus without complications: Secondary | ICD-10-CM | POA: Diagnosis not present

## 2017-02-26 DIAGNOSIS — M069 Rheumatoid arthritis, unspecified: Secondary | ICD-10-CM | POA: Diagnosis not present

## 2017-02-26 DIAGNOSIS — K219 Gastro-esophageal reflux disease without esophagitis: Secondary | ICD-10-CM | POA: Diagnosis not present

## 2017-02-26 DIAGNOSIS — L930 Discoid lupus erythematosus: Secondary | ICD-10-CM | POA: Diagnosis not present

## 2017-02-26 DIAGNOSIS — J449 Chronic obstructive pulmonary disease, unspecified: Secondary | ICD-10-CM | POA: Diagnosis not present

## 2017-02-26 DIAGNOSIS — Z5181 Encounter for therapeutic drug level monitoring: Secondary | ICD-10-CM

## 2017-02-26 DIAGNOSIS — E039 Hypothyroidism, unspecified: Secondary | ICD-10-CM | POA: Diagnosis not present

## 2017-02-26 LAB — POCT INR: INR: 2.2

## 2017-02-26 NOTE — Telephone Encounter (Signed)
Jaime Kelley with [facility] states she thought Dr Sarajane Jews was to order portable O2 tanks for outside the home. Please advise

## 2017-02-26 NOTE — Patient Instructions (Signed)
Pre visit review using our clinic review tool, if applicable. No additional management support is needed unless otherwise documented below in the visit note. 

## 2017-02-27 NOTE — Telephone Encounter (Signed)
I left a voice message with verbal order, we can fax order if needed, just need fax number.

## 2017-02-27 NOTE — Telephone Encounter (Signed)
Ready to fax  

## 2017-02-27 NOTE — Telephone Encounter (Signed)
I faxed over order as well to [phone].

## 2017-03-04 ENCOUNTER — Other Ambulatory Visit: Payer: Self-pay | Admitting: Family Medicine

## 2017-03-05 ENCOUNTER — Telehealth: Payer: Self-pay | Admitting: Family Medicine

## 2017-03-05 ENCOUNTER — Ambulatory Visit (INDEPENDENT_AMBULATORY_CARE_PROVIDER_SITE_OTHER): Payer: Medicare Other | Admitting: General Practice

## 2017-03-05 DIAGNOSIS — K219 Gastro-esophageal reflux disease without esophagitis: Secondary | ICD-10-CM | POA: Diagnosis not present

## 2017-03-05 DIAGNOSIS — E119 Type 2 diabetes mellitus without complications: Secondary | ICD-10-CM | POA: Diagnosis not present

## 2017-03-05 DIAGNOSIS — J449 Chronic obstructive pulmonary disease, unspecified: Secondary | ICD-10-CM | POA: Diagnosis not present

## 2017-03-05 DIAGNOSIS — E039 Hypothyroidism, unspecified: Secondary | ICD-10-CM | POA: Diagnosis not present

## 2017-03-05 DIAGNOSIS — L930 Discoid lupus erythematosus: Secondary | ICD-10-CM | POA: Diagnosis not present

## 2017-03-05 DIAGNOSIS — Z5181 Encounter for therapeutic drug level monitoring: Secondary | ICD-10-CM

## 2017-03-05 DIAGNOSIS — M069 Rheumatoid arthritis, unspecified: Secondary | ICD-10-CM | POA: Diagnosis not present

## 2017-03-05 LAB — POCT INR: INR: 2.7

## 2017-03-05 NOTE — Telephone Encounter (Signed)
Nurse calling in.  There is an Rx for a light oxygen and [facility] doesn't carry the "light pack".  Jenny Reichmann advised that she needs the walker so she DOES need the lighter pack but [facility] doesn't carry the "light pack". Jenny Reichmann suggests maybe contact [place] as other patient's she has worked with have used [facility].  Also as an [place]- patient has some swelling on the top of her left foot that is pitting and her ankle is a little swollen.  There is no pain when she walks and it has a good pulse.  Patient is going to start elevating more.

## 2017-03-05 NOTE — Telephone Encounter (Signed)
Noted. Please contact [facility] about a "light oxygen tank" and if they need another written rx we can do this

## 2017-03-05 NOTE — Telephone Encounter (Signed)
I spoke Jaime Kelley from [facility] and she is going to check with respiratory therapy to find out what equipment and tanks they carry. She will contact our office for a order if needed. Dr. Sarajane Jews is aware of this message.

## 2017-03-05 NOTE — Telephone Encounter (Signed)
See not on request, looks like this medication was discontinued?

## 2017-03-05 NOTE — Patient Instructions (Signed)
Pre visit review using our clinic review tool, if applicable. No additional management support is needed unless otherwise documented below in the visit note. 

## 2017-03-06 NOTE — Telephone Encounter (Signed)
I referred her to Pulmonary

## 2017-03-06 NOTE — Telephone Encounter (Signed)
Does patient need a pulmonary referral?

## 2017-03-06 NOTE — Telephone Encounter (Signed)
Jaime Kelley is calling respiratory therapy does not have in stock simply go mini oxygen tank . Pt needs to be tested for conversion device this is not a continuance oxygen device.

## 2017-03-06 NOTE — Addendum Note (Signed)
Addended by: [name] A on: 03/06/2017 05:01 PM   Modules accepted: Orders

## 2017-03-07 NOTE — Telephone Encounter (Signed)
I spoke with Jenny Reichmann at home care and gave information about referral, she will let pt know.

## 2017-03-12 ENCOUNTER — Telehealth: Payer: Self-pay | Admitting: Family Medicine

## 2017-03-12 MED ORDER — HYDROCODONE-ACETAMINOPHEN 5-325 MG PO TABS: ORAL_TABLET | ORAL | 0 refills | Status: DC

## 2017-03-12 NOTE — Telephone Encounter (Signed)
Pt would like to pick up hydrocodone rx this thurs

## 2017-03-12 NOTE — Telephone Encounter (Signed)
Is this ok to refill?  

## 2017-03-12 NOTE — Telephone Encounter (Signed)
Patient notified this rx is ready to be picked up.

## 2017-03-13 ENCOUNTER — Ambulatory Visit: Payer: Medicare Other | Admitting: Family Medicine

## 2017-03-15 ENCOUNTER — Ambulatory Visit (INDEPENDENT_AMBULATORY_CARE_PROVIDER_SITE_OTHER): Payer: Medicare Other | Admitting: Family Medicine

## 2017-03-15 ENCOUNTER — Encounter: Payer: Self-pay | Admitting: Family Medicine

## 2017-03-15 VITALS — BP 110/70 | HR 90 | Temp 98.7°F | Ht 62.0 in | Wt 203.0 lb

## 2017-03-15 DIAGNOSIS — F17200 Nicotine dependence, unspecified, uncomplicated: Secondary | ICD-10-CM | POA: Diagnosis not present

## 2017-03-15 DIAGNOSIS — J439 Emphysema, unspecified: Secondary | ICD-10-CM

## 2017-03-15 DIAGNOSIS — I10 Essential (primary) hypertension: Secondary | ICD-10-CM | POA: Diagnosis not present

## 2017-03-15 NOTE — Patient Instructions (Signed)
WE NOW OFFER   [facility] FAST TRACK!!!  SAME DAY Appointments for ACUTE CARE  Such as: Sprains, Injuries, cuts, abrasions, rashes, muscle pain, joint pain, back pain Colds, flu, sore throats, headache, allergies, cough, fever  Ear pain, sinus and eye infections Abdominal pain, nausea, vomiting, diarrhea, upset stomach Animal/insect bites  3 Easy Ways to Schedule: Walk-In Scheduling Call in scheduling Mychart Sign-up: [email or web address]         

## 2017-03-15 NOTE — Progress Notes (Signed)
   Subjective:    Patient ID: Jaime Kelley, female    DOB: 11/20/1950, 66 y.o.   MRN: [id number]  HPI Here to follow up. She is doing well in general. She is wearing her oxygen most of the time, and her sats remain in the 90s. We checked a BMET last month showing normal renal function. She remains off cigarettes and is still wearing a nicotine patch. She does mention some ankle swelling that showed up a few weeks ago. There is no discomfort with this.    Review of Systems  Constitutional: Negative.   Respiratory: Negative.   Cardiovascular: Positive for leg swelling. Negative for chest pain and palpitations.  Gastrointestinal: Negative.   Neurological: Negative.        Objective:   Physical Exam  Constitutional: She is oriented to person, place, and time. She appears well-developed and well-nourished.  Neck: No thyromegaly present.  Cardiovascular: Normal rate, regular rhythm, normal heart sounds and intact distal pulses.   Pulmonary/Chest: Effort normal and breath sounds normal. No respiratory distress. She has no wheezes. She has no rales.  Musculoskeletal:  2+ edema in both feet and ankles   Lymphadenopathy:    She has no cervical adenopathy.  Neurological: She is alert and oriented to person, place, and time.          Assessment & Plan:  She has some pedal edema but this is mild and asymptomatic. We agreed not to treat this with diuretics and to avoid creating more problems. She will elevated her feet when possible. She has an upcoming appt to meet with Pulmonology soon.  [name], MD

## 2017-03-16 ENCOUNTER — Ambulatory Visit (INDEPENDENT_AMBULATORY_CARE_PROVIDER_SITE_OTHER): Payer: Medicare Other | Admitting: General Practice

## 2017-03-16 DIAGNOSIS — E119 Type 2 diabetes mellitus without complications: Secondary | ICD-10-CM | POA: Diagnosis not present

## 2017-03-16 DIAGNOSIS — M069 Rheumatoid arthritis, unspecified: Secondary | ICD-10-CM | POA: Diagnosis not present

## 2017-03-16 DIAGNOSIS — J449 Chronic obstructive pulmonary disease, unspecified: Secondary | ICD-10-CM | POA: Diagnosis not present

## 2017-03-16 DIAGNOSIS — K219 Gastro-esophageal reflux disease without esophagitis: Secondary | ICD-10-CM | POA: Diagnosis not present

## 2017-03-16 DIAGNOSIS — L930 Discoid lupus erythematosus: Secondary | ICD-10-CM | POA: Diagnosis not present

## 2017-03-16 DIAGNOSIS — E039 Hypothyroidism, unspecified: Secondary | ICD-10-CM | POA: Diagnosis not present

## 2017-03-16 DIAGNOSIS — Z5181 Encounter for therapeutic drug level monitoring: Secondary | ICD-10-CM

## 2017-03-16 LAB — POCT INR: INR: 3.6

## 2017-03-16 NOTE — Patient Instructions (Signed)
Pre visit review using our clinic review tool, if applicable. No additional management support is needed unless otherwise documented below in the visit note. 

## 2017-03-20 ENCOUNTER — Telehealth: Payer: Self-pay

## 2017-03-20 DIAGNOSIS — E039 Hypothyroidism, unspecified: Secondary | ICD-10-CM | POA: Diagnosis not present

## 2017-03-20 DIAGNOSIS — L930 Discoid lupus erythematosus: Secondary | ICD-10-CM | POA: Diagnosis not present

## 2017-03-20 DIAGNOSIS — J449 Chronic obstructive pulmonary disease, unspecified: Secondary | ICD-10-CM | POA: Diagnosis not present

## 2017-03-20 DIAGNOSIS — E119 Type 2 diabetes mellitus without complications: Secondary | ICD-10-CM | POA: Diagnosis not present

## 2017-03-20 DIAGNOSIS — M069 Rheumatoid arthritis, unspecified: Secondary | ICD-10-CM | POA: Diagnosis not present

## 2017-03-20 DIAGNOSIS — K219 Gastro-esophageal reflux disease without esophagitis: Secondary | ICD-10-CM | POA: Diagnosis not present

## 2017-03-20 NOTE — Telephone Encounter (Signed)
Cindy @ [facility] called to report that pt has still not scheduled pulmonary visit. Per referral notes they are to call her this week to schedule as she did not want to schedule last week when they placed initial referral call. Pt does have bilateral LE edema, which is not new. Lungs are clear. Pt's O2 is 97% on 3L via nasal cannula. She is going to recertify pt for home health 1xweek for 6 weeks. Please call if you have any further questions.  Dr. Sarajane Jews - FYI. Thanks!

## 2017-03-21 DIAGNOSIS — M150 Primary generalized (osteo)arthritis: Secondary | ICD-10-CM | POA: Diagnosis not present

## 2017-03-21 DIAGNOSIS — M25562 Pain in left knee: Secondary | ICD-10-CM | POA: Diagnosis not present

## 2017-03-21 DIAGNOSIS — M0589 Other rheumatoid arthritis with rheumatoid factor of multiple sites: Secondary | ICD-10-CM | POA: Diagnosis not present

## 2017-03-21 DIAGNOSIS — Z79899 Other long term (current) drug therapy: Secondary | ICD-10-CM | POA: Diagnosis not present

## 2017-03-24 DIAGNOSIS — M069 Rheumatoid arthritis, unspecified: Secondary | ICD-10-CM | POA: Diagnosis not present

## 2017-03-24 DIAGNOSIS — E669 Obesity, unspecified: Secondary | ICD-10-CM | POA: Diagnosis not present

## 2017-03-24 DIAGNOSIS — Z86711 Personal history of pulmonary embolism: Secondary | ICD-10-CM | POA: Diagnosis not present

## 2017-03-24 DIAGNOSIS — Z7901 Long term (current) use of anticoagulants: Secondary | ICD-10-CM | POA: Diagnosis not present

## 2017-03-24 DIAGNOSIS — L930 Discoid lupus erythematosus: Secondary | ICD-10-CM | POA: Diagnosis not present

## 2017-03-24 DIAGNOSIS — Z7984 Long term (current) use of oral hypoglycemic drugs: Secondary | ICD-10-CM | POA: Diagnosis not present

## 2017-03-24 DIAGNOSIS — Z720 Tobacco use: Secondary | ICD-10-CM | POA: Diagnosis not present

## 2017-03-24 DIAGNOSIS — J449 Chronic obstructive pulmonary disease, unspecified: Secondary | ICD-10-CM | POA: Diagnosis not present

## 2017-03-24 DIAGNOSIS — Z9981 Dependence on supplemental oxygen: Secondary | ICD-10-CM | POA: Diagnosis not present

## 2017-03-24 DIAGNOSIS — K219 Gastro-esophageal reflux disease without esophagitis: Secondary | ICD-10-CM | POA: Diagnosis not present

## 2017-03-24 DIAGNOSIS — E039 Hypothyroidism, unspecified: Secondary | ICD-10-CM | POA: Diagnosis not present

## 2017-03-24 DIAGNOSIS — E119 Type 2 diabetes mellitus without complications: Secondary | ICD-10-CM | POA: Diagnosis not present

## 2017-03-24 DIAGNOSIS — Z5181 Encounter for therapeutic drug level monitoring: Secondary | ICD-10-CM | POA: Diagnosis not present

## 2017-03-30 ENCOUNTER — Ambulatory Visit (INDEPENDENT_AMBULATORY_CARE_PROVIDER_SITE_OTHER): Payer: Medicare Other | Admitting: General Practice

## 2017-03-30 DIAGNOSIS — L930 Discoid lupus erythematosus: Secondary | ICD-10-CM | POA: Diagnosis not present

## 2017-03-30 DIAGNOSIS — M069 Rheumatoid arthritis, unspecified: Secondary | ICD-10-CM | POA: Diagnosis not present

## 2017-03-30 DIAGNOSIS — K219 Gastro-esophageal reflux disease without esophagitis: Secondary | ICD-10-CM | POA: Diagnosis not present

## 2017-03-30 DIAGNOSIS — Z5181 Encounter for therapeutic drug level monitoring: Secondary | ICD-10-CM | POA: Diagnosis not present

## 2017-03-30 DIAGNOSIS — E119 Type 2 diabetes mellitus without complications: Secondary | ICD-10-CM | POA: Diagnosis not present

## 2017-03-30 DIAGNOSIS — J449 Chronic obstructive pulmonary disease, unspecified: Secondary | ICD-10-CM | POA: Diagnosis not present

## 2017-03-30 DIAGNOSIS — E039 Hypothyroidism, unspecified: Secondary | ICD-10-CM | POA: Diagnosis not present

## 2017-03-30 LAB — POCT INR: INR: 1.9

## 2017-03-30 NOTE — Patient Instructions (Signed)
Pre visit review using our clinic review tool, if applicable. No additional management support is needed unless otherwise documented below in the visit note. 

## 2017-03-30 NOTE — Progress Notes (Signed)
I have reviewed and agree with the plan. 

## 2017-04-03 DIAGNOSIS — M069 Rheumatoid arthritis, unspecified: Secondary | ICD-10-CM | POA: Diagnosis not present

## 2017-04-03 DIAGNOSIS — E039 Hypothyroidism, unspecified: Secondary | ICD-10-CM | POA: Diagnosis not present

## 2017-04-03 DIAGNOSIS — E119 Type 2 diabetes mellitus without complications: Secondary | ICD-10-CM | POA: Diagnosis not present

## 2017-04-03 DIAGNOSIS — L930 Discoid lupus erythematosus: Secondary | ICD-10-CM | POA: Diagnosis not present

## 2017-04-03 DIAGNOSIS — J449 Chronic obstructive pulmonary disease, unspecified: Secondary | ICD-10-CM | POA: Diagnosis not present

## 2017-04-03 DIAGNOSIS — K219 Gastro-esophageal reflux disease without esophagitis: Secondary | ICD-10-CM | POA: Diagnosis not present

## 2017-04-12 ENCOUNTER — Other Ambulatory Visit: Payer: Self-pay

## 2017-04-12 NOTE — Telephone Encounter (Signed)
Please advise on refill. Thanks. 

## 2017-04-13 ENCOUNTER — Ambulatory Visit (INDEPENDENT_AMBULATORY_CARE_PROVIDER_SITE_OTHER): Payer: Medicare Other | Admitting: General Practice

## 2017-04-13 DIAGNOSIS — L930 Discoid lupus erythematosus: Secondary | ICD-10-CM | POA: Diagnosis not present

## 2017-04-13 DIAGNOSIS — Z5181 Encounter for therapeutic drug level monitoring: Secondary | ICD-10-CM | POA: Diagnosis not present

## 2017-04-13 DIAGNOSIS — K219 Gastro-esophageal reflux disease without esophagitis: Secondary | ICD-10-CM | POA: Diagnosis not present

## 2017-04-13 DIAGNOSIS — M069 Rheumatoid arthritis, unspecified: Secondary | ICD-10-CM | POA: Diagnosis not present

## 2017-04-13 DIAGNOSIS — J449 Chronic obstructive pulmonary disease, unspecified: Secondary | ICD-10-CM | POA: Diagnosis not present

## 2017-04-13 DIAGNOSIS — E119 Type 2 diabetes mellitus without complications: Secondary | ICD-10-CM | POA: Diagnosis not present

## 2017-04-13 DIAGNOSIS — E039 Hypothyroidism, unspecified: Secondary | ICD-10-CM | POA: Diagnosis not present

## 2017-04-13 LAB — POCT INR: INR: 2.4

## 2017-04-13 MED ORDER — HYDROCODONE-ACETAMINOPHEN 5-325 MG PO TABS: ORAL_TABLET | ORAL | 0 refills | Status: DC

## 2017-04-13 NOTE — Telephone Encounter (Signed)
done

## 2017-04-13 NOTE — Progress Notes (Signed)
I have reviewed and agree with the plan. 

## 2017-04-13 NOTE — Telephone Encounter (Signed)
Script is ready for pick up here at front office and I spoke with pt.  

## 2017-04-13 NOTE — Telephone Encounter (Signed)
This is Dr. Barbie Banner  patient

## 2017-04-13 NOTE — Patient Instructions (Signed)
Pre visit review using our clinic review tool, if applicable. No additional management support is needed unless otherwise documented below in the visit note. 

## 2017-04-20 ENCOUNTER — Ambulatory Visit (INDEPENDENT_AMBULATORY_CARE_PROVIDER_SITE_OTHER): Payer: Medicare Other | Admitting: General Practice

## 2017-04-20 ENCOUNTER — Telehealth: Payer: Self-pay | Admitting: Family Medicine

## 2017-04-20 DIAGNOSIS — E119 Type 2 diabetes mellitus without complications: Secondary | ICD-10-CM | POA: Diagnosis not present

## 2017-04-20 DIAGNOSIS — Z5181 Encounter for therapeutic drug level monitoring: Secondary | ICD-10-CM | POA: Diagnosis not present

## 2017-04-20 DIAGNOSIS — M069 Rheumatoid arthritis, unspecified: Secondary | ICD-10-CM | POA: Diagnosis not present

## 2017-04-20 DIAGNOSIS — E039 Hypothyroidism, unspecified: Secondary | ICD-10-CM | POA: Diagnosis not present

## 2017-04-20 DIAGNOSIS — K219 Gastro-esophageal reflux disease without esophagitis: Secondary | ICD-10-CM | POA: Diagnosis not present

## 2017-04-20 DIAGNOSIS — J449 Chronic obstructive pulmonary disease, unspecified: Secondary | ICD-10-CM | POA: Diagnosis not present

## 2017-04-20 DIAGNOSIS — I824Y9 Acute embolism and thrombosis of unspecified deep veins of unspecified proximal lower extremity: Secondary | ICD-10-CM | POA: Diagnosis not present

## 2017-04-20 DIAGNOSIS — L930 Discoid lupus erythematosus: Secondary | ICD-10-CM | POA: Diagnosis not present

## 2017-04-20 LAB — POCT INR: INR: 2.9

## 2017-04-20 NOTE — Progress Notes (Signed)
I have reviewed and agree with the plan. 

## 2017-04-20 NOTE — Telephone Encounter (Signed)
Discharging her next week her goals are being met tremendously she has winged herself off of continuous oxygen with O2 stat 93% and above. She has a pulmonology appointment July 11th. They are pleased with her progress and she has stopped smoking and would like to let you know that she is a great patient.   Her left wrist has pain right a her thumb joint towards her wrist pain level is at an 8.

## 2017-04-20 NOTE — Patient Instructions (Signed)
Pre visit review using our clinic review tool, if applicable. No additional management support is needed unless otherwise documented below in the visit note. 

## 2017-04-23 NOTE — Telephone Encounter (Signed)
That's great news about her breathing improving. For the hand pain try Ibuprofen.

## 2017-04-24 NOTE — Telephone Encounter (Signed)
I spoke with pt and gave below advice. 

## 2017-04-26 DIAGNOSIS — J449 Chronic obstructive pulmonary disease, unspecified: Secondary | ICD-10-CM | POA: Diagnosis not present

## 2017-04-27 ENCOUNTER — Ambulatory Visit (INDEPENDENT_AMBULATORY_CARE_PROVIDER_SITE_OTHER): Payer: Medicare Other | Admitting: General Practice

## 2017-04-27 DIAGNOSIS — I824Y9 Acute embolism and thrombosis of unspecified deep veins of unspecified proximal lower extremity: Secondary | ICD-10-CM

## 2017-04-27 DIAGNOSIS — M069 Rheumatoid arthritis, unspecified: Secondary | ICD-10-CM | POA: Diagnosis not present

## 2017-04-27 DIAGNOSIS — Z5181 Encounter for therapeutic drug level monitoring: Secondary | ICD-10-CM

## 2017-04-27 DIAGNOSIS — L930 Discoid lupus erythematosus: Secondary | ICD-10-CM | POA: Diagnosis not present

## 2017-04-27 DIAGNOSIS — K219 Gastro-esophageal reflux disease without esophagitis: Secondary | ICD-10-CM | POA: Diagnosis not present

## 2017-04-27 DIAGNOSIS — J449 Chronic obstructive pulmonary disease, unspecified: Secondary | ICD-10-CM | POA: Diagnosis not present

## 2017-04-27 DIAGNOSIS — E039 Hypothyroidism, unspecified: Secondary | ICD-10-CM | POA: Diagnosis not present

## 2017-04-27 DIAGNOSIS — E119 Type 2 diabetes mellitus without complications: Secondary | ICD-10-CM | POA: Diagnosis not present

## 2017-04-27 LAB — POCT INR: INR: 3.2

## 2017-04-27 NOTE — Progress Notes (Signed)
I have reviewed and agree with the plan. 

## 2017-04-27 NOTE — Patient Instructions (Signed)
Pre visit review using our clinic review tool, if applicable. No additional management support is needed unless otherwise documented below in the visit note. 

## 2017-05-02 ENCOUNTER — Encounter: Payer: Self-pay | Admitting: Internal Medicine

## 2017-05-02 ENCOUNTER — Ambulatory Visit (INDEPENDENT_AMBULATORY_CARE_PROVIDER_SITE_OTHER): Payer: Medicare Other | Admitting: Internal Medicine

## 2017-05-02 VITALS — BP 134/74 | HR 85 | Ht 64.0 in | Wt 202.2 lb

## 2017-05-02 DIAGNOSIS — J449 Chronic obstructive pulmonary disease, unspecified: Secondary | ICD-10-CM

## 2017-05-02 DIAGNOSIS — J9601 Acute respiratory failure with hypoxia: Secondary | ICD-10-CM | POA: Diagnosis not present

## 2017-05-02 DIAGNOSIS — I10 Essential (primary) hypertension: Secondary | ICD-10-CM

## 2017-05-02 MED ORDER — IRBESARTAN 300 MG PO TABS: 300.0000 mg | ORAL_TABLET | Freq: Every day | ORAL | 11 refills | Status: DC

## 2017-05-02 NOTE — Progress Notes (Signed)
Subjective:     Patient ID: Jaime Kelley, female   DOB: [birth year],   MRN: [id number]  HPI  22 yowf  Quit smoking 01/18/17 with best days limited more by arthritis in knees and hips than by sob s need fo 02 and rarely saba for flares which typically occurred just once a year then admit:     Admission date:  01/18/2017    Discharge Date:  01/21/2017   Primary MD  [name], MD  Recommendations for primary care physician for things to follow:   INR recheck Admission Diagnosis  Hypoxemia [R09.02] Hypokalemia [E87.6] COPD exacerbation (HCC) [J44.1] Hypotension, unspecified hypotension type [I95.9]   Discharge Diagnosis  Hypoxemia [R09.02] Hypokalemia [E87.6] COPD exacerbation (HCC) [J44.1] Hypotension, unspecified hypotension type [I95.9]    Principal Problem:   Acute hypoxemic respiratory failure ([place]) Active Problems:   Hypothyroidism   TOBACCO ABUSE   COPD with emphysema ([place])   GERD   Rheumatoid arthritis ([place])   HTN (hypertension)   Type 2 diabetes mellitus ([place])             HPI  from the history and physical done on the day of admission:    HPI: Jaime Kelley a 66 y.o.femalewith medical history significant of COPD, PE, who smokes one half packs a day, rheumatoid arthritis. The patient had a cough last week which improved with Zithromax however she continues to be severely short of breath and "feels like she is dying. She is not eating much. She is extremely weak. No complaint of chest pain. Has felt feverish but that was last week and has improved. It seems most of her symptoms improved however shortness of breath got worse. No sore throat,runny nose or headaches. EMS found her to have pulse ox in the 70s with wheezing. She was given albuterol Atrovent and Solu-Medrol by EMS.  ED Course:Pulse ox 95% on 4 L of oxygen. Blood pressure 82/58.  Sodium 127, potassium 2.6     Hospital Course:    1)Acute Hypoxemic Respiratory Failure-  patient was not previously on home O2, suspect hypoxia is secondary to COPD with acute exacerbation, influenza test and respiratory panel arenegative,  Treated with IV Solu-Medrol, bronchodilators and doxycycline.  okay to discharge home on oxygen and prednisone  2)Coag Neg Staph Bacteremia- coag-negative staph bacteremia, one out of 2 bottles suspect contaminant. No leukocytosis and patient does not look toxic.Lactic acid was normal. No fever  Or chills   3)HypoNatremia- improved, HCTZ has been stopped, sodium is 132, avoid excessive free water intake  4)H/o PE-  INR was elevated, INR was 5.8, Restarted Coumadin , INR today is < 2, Recheck INR Post discharge  5)DM-  Resume home rx  6)Tobacco Use Disorder- smoking cessation strongly advised  7)Rheumatoid Arthritis- continue methotrexate every weekly, ??? Patient has some degree of immunosuppression due to long-standing use of immunosuppressants  8)HTN- BP is improved, c/n Amlodipine and Lisinopril,    05/02/2017 1st [facility] office visit/ Amela Handley   Chief Complaint  Patient presents with  . Advice Only    Referred by Dr. Sarajane Jews due to being in the ICU 01/18/2017 for a total of 4 days, Dx of COPD. Breathing has improved since hospital visit. SOB on exertion. Denies any cough or CP. Pt was sent home on 5L after hospital admission and went down to 2L. Pt still has oxygen tank but is not currently using it. DME: [facility].  using duoneb maybe twice a week and the hfa not all.  One flight of steps s gasping / sleeping fine  sats only drop walking up steps, not with adls or flat walking (slowly with walker)  C/o daytime tickle and sense of pnds but s excess/ purulent sputum or mucus plugs    No obvious day to day or daytime variability or assoc   hemoptysis or cp or chest tightness, subjective wheeze or overt sinus or hb symptoms. No unusual exp hx or h/o childhood pna/ asthma or knowledge of premature birth.  Sleeping ok without  nocturnal  or early am exacerbation  of respiratory  c/o's or need for noct saba. Also denies any obvious fluctuation of symptoms with weather or environmental changes or other aggravating or alleviating factors except as outlined above   Current Medications, Allergies, Complete Past Medical History, Past Surgical History, Family History, and Social History were reviewed in [employer] record.  ROS  The following are not active complaints unless bolded sore throat, dysphagia, dental problems, itching, sneezing,  nasal congestion or excess/ purulent secretions, ear ache,   fever, chills, sweats, unintended wt loss, classically pleuritic or exertional cp,  orthopnea pnd or leg swelling, presyncope, palpitations, abdominal pain, anorexia, nausea, vomiting, diarrhea  or change in bowel or bladder habits, change in stools or urine, dysuria,hematuria,  rash, arthralgias, visual complaints, headache, numbness, weakness or ataxia or problems with walking or coordination,  change in mood/affect or memory.                  Review of Systems     Objective:   Physical Exam      amb obese wf nad - walks with 4 pronged walker   Wt Readings from Last 3 Encounters:  05/02/17 202 lb 3.2 oz (91.7 kg)  03/15/17 203 lb (92.1 kg)  02/13/17 215 lb (97.5 kg)    Vital signs reviewed  - Note on arrival 02 sats  95% on RA      HEENT: nl   turbinates bilaterally, and oropharynx. Nl external ear canals without cough reflex - edentulous    NECK :  without JVD/Nodes/TM/ nl carotid upstrokes bilaterally   LUNGS: no acc muscle use,  slt barrel contour chest with distant bs and scattered late exp minimal rhonchi bilaterally    CV:  RRR  no s3 or murmur or increase in P2, and no edema   ABD:  soft and nontender with nl inspiratory excursion in the supine position. No bruits or organomegaly appreciated, bowel sounds nl  MS:  Nl gait/ ext warm without deformities, calf tenderness,  cyanosis or clubbing No obvious joint restrictions   SKIN: warm and dry without lesions    NEURO:  alert, approp, nl sensorium with  no motor or cerebellar deficits apparent.       I personally reviewed images and agree with radiology impression as follows:  CXR:   01/08/17  COPD, no acute cardiopulmonary process.    Assessment:

## 2017-05-02 NOTE — Patient Instructions (Addendum)
Stop lisinipril and start avapro 300 mg (ibesartan)  one half daily      Plan B = Backup  Only use your albuterol (proair) as a rescue medication to be used if you can't catch your breath by resting or doing a relaxed purse lip breathing pattern.  - The less you use it, the better it will work when you need it. - Ok to use the inhaler up to 2 puffs  every 4 hours if you must but call for appointment if use goes up over your usual need - Don't leave home without it !!  (think of it like the spare tire for your car)   Plan C = Crisis - only use your albuterol-ipratropium  nebulizer if you first try Plan B and it fails to help > ok to use the nebulizer up to every 4 hours but if start needing it regularly call for immediate appointment   Work on inhaler technique:  relax and gently blow all the way out then take a nice smooth deep breath back in, triggering the inhaler at same time you start breathing in.  Hold for up to 5 seconds if you can. Blow out thru nose. Rinse and gargle with water when done  Keep 02 for now and use if your 02 stays below  90%   Please schedule a follow up office visit in 6 weeks, call sooner if needed with pfts on return

## 2017-05-03 ENCOUNTER — Encounter: Payer: Self-pay | Admitting: Internal Medicine

## 2017-05-03 ENCOUNTER — Telehealth: Payer: Self-pay | Admitting: *Deleted

## 2017-05-03 DIAGNOSIS — J449 Chronic obstructive pulmonary disease, unspecified: Secondary | ICD-10-CM

## 2017-05-03 NOTE — Assessment & Plan Note (Signed)
Quit smoking at admit  01/18/17  - Spirometry 05/02/2017  FEV1 1.09 (46%)  Ratio 50 with classic curvature   - 05/02/2017  After extensive coaching HFA effectiveness =    75%  From a baseline of 25%   As I explained to this patient in detail:  although there  Is severe copd present, it may not be clinically relevant:   it does not appear to be limiting activity tolerance any more than a set of worn tires limits someone from driving a car  around a parking lot.  A new set of Michelins might look good but would have no perceived impact on the performance of the car and would not be worth the cost.  That is to say:   this pt is so sedentary I don't recommend aggressive pulmonary rx at this point unless limiting symptoms arise or acute exacerbations become as issue, neither of which is the case now.  I asked the patient to contact this office at any time in the future should either of these problems arise.    Also worth noting:   informed the patient there was  no medication on the market that has proven to alter the curve/ its downward trajectory  or the likelihood of progression of their disease(unlike other chronic medical conditions such as atheroclerosis where we do think we can change the natural hx with risk reducing meds)    Therefore stopping smoking and maintaining abstinence is the most important aspect of care, not choice of inhalers or for that matter, doctors.    If she does become limited and need saba more than twice daily I would add a lama and if no better a lama/laba  If she started to have tendency to AB / exac I would add LABA/ICS next

## 2017-05-03 NOTE — Telephone Encounter (Signed)
I spoke with the pt and have scheduled appts

## 2017-05-03 NOTE — Assessment & Plan Note (Signed)
This has clearly resolved and now only desats when going up steps  02 for stable copd pts who only desat with exertion is no longer "standard of care" but if she becomes more active and we can prove the addition of 02 improves her activity tol, then would rec eval for POC for this purpose and will check this issue out again in 6 weeks.  In meantime 02 can be prn to keep sats > 90% or she can pace herself at a lower level (eg walk up steps slower s it, as it does add extra wt /risk of falling if she were truly to use it just walking up steps)

## 2017-05-03 NOTE — Telephone Encounter (Signed)
-----   Message from Tanda Rockers, MD sent at 05/03/2017  5:30 AM EDT ----- Needs pfts on return

## 2017-05-03 NOTE — Assessment & Plan Note (Addendum)
D/c acei 05/02/2017 due to throat tickle   ACE inhibitors are problematic in  pts with airway complaints because  even experienced pulmonologists can't always distinguish ace effects from copd/asthma.  By themselves they don't actually cause a problem, much like oxygen can't by itself start a fire, but they certainly serve as a powerful catalyst or enhancer for any "fire"  or inflammatory process in the upper airway, be it caused by an ET  tube or more commonly reflux (especially in the obese or pts with known GERD or who are on biphoshonates) or even a URI   In the era of ARB near equivalency,  it just makes sense to avoid ACEI  entirely in the short run and then decide later, having established  What if any side effects may have been due to the acei whether to put the pt back on them at all.   Try avaproo 300 mg one half daily and take a whole pill if not controlled as she is currently on high dose lisinopril and Follow up per Primary Care recommended

## 2017-05-03 NOTE — Assessment & Plan Note (Signed)
Body mass index is 34.71 kg/m.  -  Trending down even off cigs, encouraged Lab Results  Component Value Date   TSH 0.86 05/25/2016     Contributing to gerd risk/ doe/reviewed the need and the process to achieve and maintain neg calorie balance > defer f/u primary care including intermittently monitoring thyroid status

## 2017-05-14 ENCOUNTER — Ambulatory Visit (INDEPENDENT_AMBULATORY_CARE_PROVIDER_SITE_OTHER): Payer: Medicare Other | Admitting: General Practice

## 2017-05-14 DIAGNOSIS — Z5181 Encounter for therapeutic drug level monitoring: Secondary | ICD-10-CM | POA: Diagnosis not present

## 2017-05-14 LAB — POCT INR: INR: 2.5

## 2017-05-14 NOTE — Patient Instructions (Signed)
Pre visit review using our clinic review tool, if applicable. No additional management support is needed unless otherwise documented below in the visit note. 

## 2017-05-15 ENCOUNTER — Telehealth: Payer: Self-pay | Admitting: Family Medicine

## 2017-05-15 MED ORDER — HYDROCODONE-ACETAMINOPHEN 5-325 MG PO TABS: ORAL_TABLET | ORAL | 0 refills | Status: DC

## 2017-05-15 NOTE — Telephone Encounter (Signed)
Pt needs new rx hydrocodone °

## 2017-05-15 NOTE — Telephone Encounter (Signed)
Script is ready for pick up here at front office and I spoke with pt.  

## 2017-05-15 NOTE — Telephone Encounter (Signed)
Done

## 2017-05-20 ENCOUNTER — Other Ambulatory Visit: Payer: Self-pay | Admitting: Family Medicine

## 2017-05-21 ENCOUNTER — Other Ambulatory Visit: Payer: Self-pay | Admitting: Family Medicine

## 2017-06-02 ENCOUNTER — Other Ambulatory Visit: Payer: Self-pay | Admitting: Family Medicine

## 2017-06-11 ENCOUNTER — Ambulatory Visit (INDEPENDENT_AMBULATORY_CARE_PROVIDER_SITE_OTHER): Payer: Medicare Other | Admitting: General Practice

## 2017-06-11 DIAGNOSIS — I824Y9 Acute embolism and thrombosis of unspecified deep veins of unspecified proximal lower extremity: Secondary | ICD-10-CM

## 2017-06-11 DIAGNOSIS — Z5181 Encounter for therapeutic drug level monitoring: Secondary | ICD-10-CM

## 2017-06-11 LAB — POCT INR: INR: 3.2

## 2017-06-11 NOTE — Patient Instructions (Signed)
Pre visit review using our clinic review tool, if applicable. No additional management support is needed unless otherwise documented below in the visit note. 

## 2017-06-13 ENCOUNTER — Ambulatory Visit (INDEPENDENT_AMBULATORY_CARE_PROVIDER_SITE_OTHER): Payer: Medicare Other | Admitting: Internal Medicine

## 2017-06-13 ENCOUNTER — Encounter: Payer: Self-pay | Admitting: Internal Medicine

## 2017-06-13 VITALS — BP 106/70 | HR 88 | Ht 64.0 in | Wt 206.0 lb

## 2017-06-13 DIAGNOSIS — J449 Chronic obstructive pulmonary disease, unspecified: Secondary | ICD-10-CM | POA: Diagnosis not present

## 2017-06-13 DIAGNOSIS — J9601 Acute respiratory failure with hypoxia: Secondary | ICD-10-CM | POA: Diagnosis not present

## 2017-06-13 DIAGNOSIS — I10 Essential (primary) hypertension: Secondary | ICD-10-CM | POA: Diagnosis not present

## 2017-06-13 LAB — PULMONARY FUNCTION TEST
DL/VA % pred: 53 %
DL/VA: 2.56 ml/min/mmHg/L
DLCO COR % PRED: 43 %
DLCO UNC: 11.31 ml/min/mmHg
DLCO cor: 10.63 ml/min/mmHg
DLCO unc % pred: 46 %
FEF 25-75 POST: 0.56 L/s
FEF 25-75 Pre: 0.4 L/sec
FEF2575-%Change-Post: 40 %
FEF2575-%PRED-POST: 27 %
FEF2575-%Pred-Pre: 19 %
FEV1-%CHANGE-POST: 4 %
FEV1-%PRED-POST: 47 %
FEV1-%PRED-PRE: 45 %
FEV1-PRE: 1.08 L
FEV1-Post: 1.13 L
FEV1FVC-%CHANGE-POST: -5 %
FEV1FVC-%PRED-PRE: 60 %
FEV6-%Change-Post: 9 %
FEV6-%Pred-Post: 77 %
FEV6-%Pred-Pre: 70 %
FEV6-POST: 2.3 L
FEV6-Pre: 2.09 L
FEV6FVC-%Change-Post: 0 %
FEV6FVC-%PRED-POST: 93 %
FEV6FVC-%Pred-Pre: 93 %
FVC-%CHANGE-POST: 10 %
FVC-%PRED-PRE: 74 %
FVC-%Pred-Post: 83 %
FVC-Post: 2.57 L
FVC-Pre: 2.32 L
PRE FEV6/FVC RATIO: 90 %
Post FEV1/FVC ratio: 44 %
Post FEV6/FVC ratio: 89 %
Pre FEV1/FVC ratio: 47 %

## 2017-06-13 MED ORDER — GLYCOPYRROLATE-FORMOTEROL 9-4.8 MCG/ACT IN AERO: 2.0000 | INHALATION_SPRAY | Freq: Two times a day (BID) | RESPIRATORY_TRACT | 0 refills | Status: DC

## 2017-06-13 MED ORDER — GLYCOPYRROLATE-FORMOTEROL 9-4.8 MCG/ACT IN AERO: 2.0000 | INHALATION_SPRAY | Freq: Two times a day (BID) | RESPIRATORY_TRACT | 11 refills | Status: DC

## 2017-06-13 NOTE — Progress Notes (Signed)
PFT done today. 

## 2017-06-13 NOTE — Patient Instructions (Addendum)
Plan A = Automatic =  Bevespi Take 2 puffs first thing in am and then another 2 puffs about 12 hours later.   Work on inhaler technique:  relax and gently blow all the way out then take a nice smooth deep breath back in, triggering the inhaler at same time you start breathing in.  Hold for up to 5 seconds if you can. Blow out thru nose. Rinse and gargle with water when done      Plan B = Backup Only use your albuterol (proair)  as a rescue medication to be used if you can't catch your breath by resting or doing a relaxed purse lip breathing pattern.  - The less you use it, the better it will work when you need it. - Ok to use the inhaler up to 2 puffs  every 4 hours if you must but call for appointment if use goes up over your usual need - Don't leave home without it !!  (think of it like the spare tire for your car)   Plan C = Crisis - only use your albuterol nebulizer if you first try Plan B and it fails to help > ok to use the nebulizer up to every 4 hours but if start needing it regularly call for immediate appointment   Please schedule a follow up visit in 6 months but call sooner if needed

## 2017-06-13 NOTE — Progress Notes (Signed)
Subjective:     Patient ID: Jaime Kelley, female   DOB: [birth year],   MRN: [id number]  HPI  50 yowf  Quit smoking 01/18/17 with best days limited more by arthritis in knees and hips than by sob s need fo 02 and rarely saba for flares which typically occurred just once a year and PFT's  06/13/2017 c/w GOLD III COPD seen in pulmonary clinic  s/p admit:     Admission date:  01/18/2017    Discharge Date:  01/21/2017   Principal Problems:   Acute hypoxemic respiratory failure ([place]) Active Problems:   Hypothyroidism   TOBACCO ABUSE   COPD with emphysema ([place])   GERD   Rheumatoid arthritis ([place])   HTN (hypertension)   Type 2 diabetes mellitus ([place])           1)Acute Hypoxemic Respiratory Failure- patient was not previously on home O2, suspect hypoxia is secondary to COPD with acute exacerbation, influenza test and respiratory panel arenegative,  Treated with IV Solu-Medrol, bronchodilators and doxycycline.  okay to discharge home on oxygen and prednisone  2)Coag Neg Staph Bacteremia- coag-negative staph bacteremia, one out of 2 bottles suspect contaminant. No leukocytosis and patient does not look toxic.Lactic acid was normal. No fever  Or chills   3)HypoNatremia- improved, HCTZ has been stopped, sodium is 132, avoid excessive free water intake  4)H/o PE-  INR was elevated, INR was 5.8, Restarted Coumadin , INR today is < 2, Recheck INR Post discharge  5)DM-  Resume home rx  6)Tobacco Use Disorder- smoking cessation strongly advised  7)Rheumatoid Arthritis- continue methotrexate every weekly, ??? Patient has some degree of immunosuppression due to long-standing use of immunosuppressants  8)HTN- BP is improved, c/n Amlodipine and Lisinopril,    05/02/2017 1st [facility] [facility] office visit/ Mckensi Redinger   Chief Complaint  Patient presents with  . Advice Only    Referred by Dr. Sarajane Jews due to being in the ICU 01/18/2017 for a total of 4 days, Dx of COPD. Breathing has improved  since hospital visit. SOB on exertion. Denies any cough or CP. Pt was sent home on 5L after hospital admission and went down to 2L. Pt still has oxygen tank but is not currently using it. DME: [facility].  using duoneb maybe twice a week and the hfa not all.  One flight of steps s gasping / sleeping fine  sats only drop walking up steps, not with adls or flat walking (slowly with walker)  C/o daytime tickle and sense of pnds but s excess/ purulent sputum or mucus plugs   rec Stop lisinipril and start avapro 300 mg (ibesartan)  one half daily  Plan B = Backup  Only use your albuterol (proair) as a rescue medication Plan C = Crisis - only use your albuterol-ipratropium  nebulizer if you first try Plan B and it fails to help > ok to use the nebulizer up to every 4 hours but if start needing it regularly call for immediate appointment Work on inhaler technique:  Keep 02 for now and use if your 02 stays below  90%    06/13/2017  f/u ov/Eneida Evers re:  Not using any 02 at all even with exertion  Which is quite limited due to knees Chief Complaint  Patient presents with  . Follow-up    PFT's done today. Pt states her breathing is unchanged. She is coughing and having more congestion- not able to produce any sputum. She states she is using her albuterol inhaler 6 x  daily on average and has not used neb b/c she thought we told her to stop.   had not used saba at all day of ov/ more limited by arthritis than sob / not typically needing saba noct off 02   No obvious day to day or daytime variability or assoc excess/ purulent sputum or mucus plugs or hemoptysis or cp or chest tightness, subjective wheeze or overt sinus or hb symptoms. No unusual exp hx or h/o childhood pna/ asthma or knowledge of premature birth.  Sleeping ok without nocturnal  or early am exacerbation  of respiratory  c/o's or need for noct saba. Also denies any obvious fluctuation of symptoms with weather or environmental changes or other  aggravating or alleviating factors except as outlined above   Current Medications, Allergies, Complete Past Medical History, Past Surgical History, Family History, and Social History were reviewed in [employer] record.  ROS  The following are not active complaints unless bolded sore throat, dysphagia, dental problems, itching, sneezing,  nasal congestion or excess/ purulent secretions, ear ache,   fever, chills, sweats, unintended wt loss, classically pleuritic or exertional cp,  orthopnea pnd or leg swelling, presyncope, palpitations, abdominal pain, anorexia, nausea, vomiting, diarrhea  or change in bowel or bladder habits, change in stools or urine, dysuria,hematuria,  rash, arthralgias much worse than usual, visual complaints, headache, numbness, weakness or ataxia or problems with walking or coordination,  change in mood/affect or memory.            Objective:   Physical Exam      amb obese wf nad - walks with 4 pronged walker   06/13/2017        206  05/02/17 202 lb 3.2 oz (91.7 kg)  03/15/17 203 lb (92.1 kg)  02/13/17 215 lb (97.5 kg)    Vital signs reviewed  - Note on arrival 02 sats  90% on RA   And bp 106/70 on avapro 300 one half daily    HEENT: nl   turbinates bilaterally, and oropharynx. Nl external ear canals without cough reflex - edentulous    NECK :  without JVD/Nodes/TM/ nl carotid upstrokes bilaterally   LUNGS: no acc muscle use,  slt barrel contour chest with distant bs and bilateral faint exp >insp rhonchi bilaterally     CV:  RRR  no s3 or murmur or increase in P2, and no edema   ABD:  soft and nontender with nl inspiratory excursion in the supine position. No bruits or organomegaly appreciated, bowel sounds nl  MS:  Nl gait/ ext warm without deformities, calf tenderness, cyanosis or clubbing No obvious joint restrictions   SKIN: warm and dry without lesions    NEURO:  alert, approp, nl sensorium with  no motor or cerebellar  deficits apparent.       I personally reviewed images and agree with radiology impression as follows:  pCXR:   12/29/16 COPD, no acute cardiopulmonary process.      Assessment:       Outpatient Encounter Prescriptions as of 06/13/2017  Medication Sig  . albuterol (PROAIR HFA) 108 (90 Base) MCG/ACT inhaler Inhale 2 puffs into the lungs every 4 (four) hours as needed for wheezing.  Marland Kitchen amLODipine (NORVASC) 10 MG tablet TAKE 1 TABLET (10 MG TOTAL) BY MOUTH DAILY.  . DiphenhydrAMINE HCl (BENADRYL ALLERGY PO) Take by mouth.  . folic acid (FOLVITE) 1 MG tablet Take 1 mg by mouth daily.    . halobetasol (ULTRAVATE) 0.05 %  cream Apply topically 2 (two) times daily.  Marland Kitchen HYDROcodone-acetaminophen (NORCO/VICODIN) 5-325 MG tablet TAKE 1 TABLET BY MOUTH EVERY 6 HOURS AS NEEDED FOR PAIN  . ipratropium-albuterol (DUONEB) 0.5-2.5 (3) MG/3ML SOLN Take 3 mLs by nebulization every 4 (four) hours as needed.  . irbesartan (AVAPRO) 300 MG tablet Take 150 mg by mouth daily.  . metFORMIN (GLUCOPHAGE) 500 MG tablet TAKE 1 TABLET BY MOUTH 2 TIMES A DAY  . methotrexate (RHEUMATREX) 2.5 MG tablet Take 15 mg by mouth once a week. Take 6 tablets. Caution:Chemotherapy. Protect from light.  . sertraline (ZOLOFT) 100 MG tablet TAKE 1/2 TABLET BY MOUTH EVERY EVENING  . SYNTHROID 137 MCG tablet TAKE 1 TABLET (137 MCG TOTAL) BY MOUTH DAILY BEFORE BREAKFAST.  Marland Kitchen warfarin (COUMADIN) 10 MG tablet TAKE AS DIRECTED BY ANTICOAGULATION CLINIC.  . [DISCONTINUED] irbesartan (AVAPRO) 300 MG tablet Take 1 tablet (300 mg total) by mouth daily. (Patient taking differently: Take 150 mg by mouth daily. )  . Glycopyrrolate-Formoterol (BEVESPI AEROSPHERE) 9-4.8 MCG/ACT AERO Inhale 2 puffs into the lungs 2 (two) times daily.  . [DISCONTINUED] Glycopyrrolate-Formoterol (BEVESPI AEROSPHERE) 9-4.8 MCG/ACT AERO Inhale 2 puffs into the lungs 2 (two) times daily.  . [DISCONTINUED] ipratropium-albuterol (DUONEB) 0.5-2.5 (3) MG/3ML SOLN Take 3 mLs by  nebulization 3 (three) times daily. (Patient not taking: Reported on 06/13/2017)  . [DISCONTINUED] warfarin (COUMADIN) 7.5 MG tablet 1 tablet every evening, recheck INR on 01/23/2017 with PCP   No facility-administered encounter medications on file as of 06/13/2017.

## 2017-06-14 NOTE — Assessment & Plan Note (Signed)
Echo 04/20/17 [employer] : low nl ef, mild mr, RVS =21 D/c acei 05/02/2017 due to throat tickle   Adequate control on present rx, reviewed in detail with pt > no change in rx needed  = avapro 300 one half daily   Although even in retrospect it may not be clear the ACEi contributed to the pt's symptoms,  Pt improved off them and adding them back at this point or in the future would risk confusion in interpretation of non-specific respiratory symptoms to which this patient is prone  ie  Better not to muddy the waters here.   Follow up per Primary Care planned

## 2017-06-14 NOTE — Assessment & Plan Note (Signed)
As of 06/13/2017 not using 02 at all > advised to monitor with goal of keeping sats > 90% at all times

## 2017-06-14 NOTE — Assessment & Plan Note (Signed)
Quit smoking at admit  01/18/17  - Spirometry 05/02/2017  FEV1 1.09 (46%)  Ratio 50 with classic curvature   - 05/02/2017  After extensive coaching HFA effectiveness =    75% > ok to just use saba prn   - PFT's  06/13/2017  FEV1 1.13 (47 % ) ratio 44  p 4 % improvement from saba p nothing  prior to study with DLCO  46/43 % corrects to 53  % for alv volume   - 06/13/2017  After extensive coaching HFA effectiveness =      > try bevespi 2bid   I'm not really convinced she'll benefit from lama/laba but note baseline over using saba which I explained risks tachyphylaxis and saba should only be used if she can't catch her breath or helps her get from point a to b, which she repeatedly stated was her knees, not her breathing.    I reviewed the Fletcher curve with the patient that basically indicates  if you quit smoking when your best day FEV1 is still  preserved (as is relatively still the case here)  it is highly unlikely you will progress to severe disease and informed the patient there was  no medication on the market that has proven to alter the curve/ its downward trajectory  or the likelihood of progression of their disease(unlike other chronic medical conditions such as atheroclerosis where we do think we can change the natural hx with risk reducing meds)    Therefore stopping smoking and maintaining abstinence is the most important aspect of care, not choice of inhalers or for that matter, doctors.    ie  rx is for symptoms, not to change the natural hx of the dz    Pulmonary f/u can be q 6 m sooner prn    I had an extended discussion with the patient reviewing all relevant studies completed to date and  lasting 15 to 20 minutes of a 25 minute visit    Each maintenance medication was reviewed in detail including most importantly the difference between maintenance and prns and under what circumstances the prns are to be triggered using an action plan format that is not reflected in the computer  generated alphabetically organized AVS.    Please see AVS for specific instructions unique to this visit that I personally wrote and verbalized to the the pt in detail and then reviewed with pt  by my nurse highlighting any  changes in therapy recommended at today's visit to their plan of care.

## 2017-06-21 DIAGNOSIS — M0589 Other rheumatoid arthritis with rheumatoid factor of multiple sites: Secondary | ICD-10-CM | POA: Diagnosis not present

## 2017-06-21 DIAGNOSIS — M150 Primary generalized (osteo)arthritis: Secondary | ICD-10-CM | POA: Diagnosis not present

## 2017-06-21 DIAGNOSIS — M5136 Other intervertebral disc degeneration, lumbar region: Secondary | ICD-10-CM | POA: Diagnosis not present

## 2017-06-21 DIAGNOSIS — Z79899 Other long term (current) drug therapy: Secondary | ICD-10-CM | POA: Diagnosis not present

## 2017-06-21 LAB — HM HEPATITIS C SCREENING LAB: HM Hepatitis Screen: NEGATIVE

## 2017-06-26 ENCOUNTER — Telehealth: Payer: Self-pay | Admitting: Family Medicine

## 2017-06-26 MED ORDER — HYDROCODONE-ACETAMINOPHEN 5-325 MG PO TABS: ORAL_TABLET | ORAL | 0 refills | Status: DC

## 2017-06-26 NOTE — Telephone Encounter (Signed)
done

## 2017-06-26 NOTE — Telephone Encounter (Signed)
Pt need new Rx for Hydrocodone   Pt is aware of 3 business days for refills and someone will call when ready for pick up. °

## 2017-06-27 NOTE — Telephone Encounter (Signed)
Pt is aware rx ready for pick up.  

## 2017-06-28 ENCOUNTER — Other Ambulatory Visit: Payer: Self-pay | Admitting: General Practice

## 2017-06-28 ENCOUNTER — Other Ambulatory Visit: Payer: Self-pay | Admitting: Family Medicine

## 2017-06-28 MED ORDER — WARFARIN SODIUM 7.5 MG PO TABS: 7.5000 mg | ORAL_TABLET | Freq: Every day | ORAL | 0 refills | Status: DC

## 2017-07-02 DIAGNOSIS — Z201 Contact with and (suspected) exposure to tuberculosis: Secondary | ICD-10-CM | POA: Diagnosis not present

## 2017-07-09 ENCOUNTER — Ambulatory Visit: Payer: Medicare Other | Admitting: General Practice

## 2017-07-09 NOTE — Patient Instructions (Signed)
Pre visit review using our clinic review tool, if applicable. No additional management support is needed unless otherwise documented below in the visit note. 

## 2017-07-12 ENCOUNTER — Encounter: Payer: Self-pay | Admitting: Family Medicine

## 2017-07-16 NOTE — Progress Notes (Signed)
Thank You.

## 2017-07-16 NOTE — Progress Notes (Signed)
I'm sorry.  I removed all the charges.  Patient was not seen.  Her visit was cancelled.

## 2017-07-25 ENCOUNTER — Encounter: Payer: Self-pay | Admitting: Family Medicine

## 2017-07-25 DIAGNOSIS — M859 Disorder of bone density and structure, unspecified: Secondary | ICD-10-CM | POA: Diagnosis not present

## 2017-07-25 DIAGNOSIS — M25561 Pain in right knee: Secondary | ICD-10-CM | POA: Diagnosis not present

## 2017-07-25 DIAGNOSIS — M5136 Other intervertebral disc degeneration, lumbar region: Secondary | ICD-10-CM | POA: Diagnosis not present

## 2017-07-25 DIAGNOSIS — L409 Psoriasis, unspecified: Secondary | ICD-10-CM | POA: Diagnosis not present

## 2017-07-25 DIAGNOSIS — M150 Primary generalized (osteo)arthritis: Secondary | ICD-10-CM | POA: Diagnosis not present

## 2017-07-25 DIAGNOSIS — L930 Discoid lupus erythematosus: Secondary | ICD-10-CM | POA: Diagnosis not present

## 2017-07-25 DIAGNOSIS — M0589 Other rheumatoid arthritis with rheumatoid factor of multiple sites: Secondary | ICD-10-CM | POA: Diagnosis not present

## 2017-07-25 DIAGNOSIS — Z79899 Other long term (current) drug therapy: Secondary | ICD-10-CM | POA: Diagnosis not present

## 2017-07-25 DIAGNOSIS — M8580 Other specified disorders of bone density and structure, unspecified site: Secondary | ICD-10-CM | POA: Diagnosis not present

## 2017-07-26 ENCOUNTER — Telehealth: Payer: Self-pay | Admitting: *Deleted

## 2017-07-26 MED ORDER — HYDROCODONE-ACETAMINOPHEN 5-325 MG PO TABS: ORAL_TABLET | ORAL | 0 refills | Status: DC

## 2017-07-26 NOTE — Telephone Encounter (Signed)
Done

## 2017-07-26 NOTE — Telephone Encounter (Signed)
Patient called requesting a refill on her hydrocodone 5-325 mg

## 2017-07-27 NOTE — Telephone Encounter (Signed)
Script is ready for pick up here at front office and spoke with pt, also letter was given.  

## 2017-08-06 ENCOUNTER — Ambulatory Visit (INDEPENDENT_AMBULATORY_CARE_PROVIDER_SITE_OTHER): Payer: Medicare Other | Admitting: General Practice

## 2017-08-06 DIAGNOSIS — Z7901 Long term (current) use of anticoagulants: Secondary | ICD-10-CM

## 2017-08-06 LAB — POCT INR: INR: 3.9

## 2017-08-06 NOTE — Patient Instructions (Signed)
Pre visit review using our clinic review tool, if applicable. No additional management support is needed unless otherwise documented below in the visit note. 

## 2017-08-14 ENCOUNTER — Ambulatory Visit (INDEPENDENT_AMBULATORY_CARE_PROVIDER_SITE_OTHER): Payer: Medicare Other | Admitting: Family Medicine

## 2017-08-14 ENCOUNTER — Encounter: Payer: Self-pay | Admitting: Family Medicine

## 2017-08-14 VITALS — BP 100/78 | HR 88 | Temp 98.1°F | Wt 212.2 lb

## 2017-08-14 DIAGNOSIS — Z23 Encounter for immunization: Secondary | ICD-10-CM | POA: Diagnosis not present

## 2017-08-14 DIAGNOSIS — J449 Chronic obstructive pulmonary disease, unspecified: Secondary | ICD-10-CM

## 2017-08-14 DIAGNOSIS — E039 Hypothyroidism, unspecified: Secondary | ICD-10-CM

## 2017-08-14 DIAGNOSIS — I10 Essential (primary) hypertension: Secondary | ICD-10-CM

## 2017-08-14 DIAGNOSIS — E1100 Type 2 diabetes mellitus with hyperosmolarity without nonketotic hyperglycemic-hyperosmolar coma (NKHHC): Secondary | ICD-10-CM

## 2017-08-14 LAB — TSH: TSH: 0.1 u[IU]/mL — ABNORMAL LOW (ref 0.35–4.50)

## 2017-08-14 LAB — HEMOGLOBIN A1C: Hgb A1c MFr Bld: 6 % (ref 4.6–6.5)

## 2017-08-14 MED ORDER — METFORMIN HCL 500 MG PO TABS: 500.0000 mg | ORAL_TABLET | Freq: Two times a day (BID) | ORAL | 3 refills | Status: DC

## 2017-08-14 MED ORDER — HYDROCODONE-ACETAMINOPHEN 5-325 MG PO TABS: ORAL_TABLET | ORAL | 0 refills | Status: DC

## 2017-08-14 MED ORDER — SYNTHROID 137 MCG PO TABS: ORAL_TABLET | ORAL | 3 refills | Status: DC

## 2017-08-14 MED ORDER — AMLODIPINE BESYLATE 5 MG PO TABS: 5.0000 mg | ORAL_TABLET | Freq: Every day | ORAL | 3 refills | Status: DC

## 2017-08-14 NOTE — Progress Notes (Signed)
   Subjective:    Patient ID: Jaime Kelley, female    DOB: Sep 13, 1951, 66 y.o.   MRN: [id number]  HPI Here to follow up. She is doing well. Her BP is stable. She does not check her glucoses but her last A1c in March was 6.1. Her COPD is stable. She has been able to stay away from tobacco. She had labs with her rheumatologist in August showing normal sodium and potassium, and normal renal function.    Review of Systems  Constitutional: Negative.   Respiratory: Negative.   Cardiovascular: Negative.   Musculoskeletal: Positive for back pain.  Neurological: Negative.        Objective:   Physical Exam  Constitutional: She is oriented to person, place, and time.  Walks with a walker   Cardiovascular: Normal rate, regular rhythm, normal heart sounds and intact distal pulses.   Pulmonary/Chest: Effort normal and breath sounds normal. No respiratory distress. She has no wheezes. She has no rales.  Neurological: She is alert and oriented to person, place, and time.          Assessment & Plan:  Her rheumatoid arthritis and COPD are stable. Her BP is a little too low so we will reduce the dose of Amlodipine to 5 mg daily. Check the diabetes with an A1c today. Check the thyroid with a TSH.  [name], MD

## 2017-08-15 ENCOUNTER — Encounter: Payer: Self-pay | Admitting: Family Medicine

## 2017-08-15 NOTE — Telephone Encounter (Signed)
Actually a low TSH indicates  the dosage may be too high. I like to keep the levels exactly where they are because most patients feel better at levels that are high normal

## 2017-08-16 ENCOUNTER — Telehealth: Payer: Self-pay | Admitting: Family Medicine

## 2017-08-16 NOTE — Telephone Encounter (Signed)
Per Dr. Sarajane Jews, pt can put ice pack on arm and hopefully this will help with pain. I spoke with pt and gave advice, she will try this and if no bette will call back to schedule office visit.

## 2017-08-16 NOTE — Telephone Encounter (Signed)
Pt called in to report reaction at site where flu shot was given this week, red, swollen and warm to the touch where shot was given.

## 2017-08-18 ENCOUNTER — Other Ambulatory Visit: Payer: Self-pay | Admitting: Family Medicine

## 2017-09-03 ENCOUNTER — Ambulatory Visit (INDEPENDENT_AMBULATORY_CARE_PROVIDER_SITE_OTHER): Payer: Medicare Other | Admitting: General Practice

## 2017-09-03 DIAGNOSIS — Z7901 Long term (current) use of anticoagulants: Secondary | ICD-10-CM

## 2017-09-03 LAB — POCT INR: INR: 3.2

## 2017-09-03 NOTE — Patient Instructions (Addendum)
Pre visit review using our clinic review tool, if applicable. No additional management support is needed unless otherwise documented below in the visit note.  Change dosage to 7.5 mg daily.  Re-check in 4 weeks.

## 2017-09-27 ENCOUNTER — Telehealth: Payer: Self-pay | Admitting: Family Medicine

## 2017-09-27 MED ORDER — HYDROCODONE-ACETAMINOPHEN 5-325 MG PO TABS: ORAL_TABLET | ORAL | 0 refills | Status: DC

## 2017-09-27 NOTE — Telephone Encounter (Signed)
Sent to PCP for approval.  

## 2017-09-27 NOTE — Telephone Encounter (Signed)
Copied from [place] [id number]. Topic: Quick Communication - Rx Refill/Question >> Sep 27, 2017  9:14 AM Antonieta Iba C wrote: Self   Refill for HYDROcodone  CB: [phone]    Agent: Please be advised that RX refills may take up to 3 business days. We ask that you follow-up with your pharmacy.

## 2017-09-27 NOTE — Telephone Encounter (Signed)
Done but she will need a pmv soon

## 2017-09-28 NOTE — Telephone Encounter (Signed)
Called pt. Pt advised and voiced understanding. Rx placed up front for pick up.

## 2017-10-01 ENCOUNTER — Ambulatory Visit: Payer: Medicare Other

## 2017-10-08 ENCOUNTER — Ambulatory Visit (INDEPENDENT_AMBULATORY_CARE_PROVIDER_SITE_OTHER): Payer: Medicare Other | Admitting: General Practice

## 2017-10-08 DIAGNOSIS — Z7901 Long term (current) use of anticoagulants: Secondary | ICD-10-CM

## 2017-10-08 LAB — POCT INR: INR: 2.8

## 2017-10-08 NOTE — Patient Instructions (Addendum)
Pre visit review using our clinic review tool, if applicable. No additional management support is needed unless otherwise documented below in the visit note.  Continue to take 7.5mg daily. Recheck in 4 weeks.  

## 2017-10-24 ENCOUNTER — Telehealth: Payer: Self-pay | Admitting: Family Medicine

## 2017-10-24 NOTE — Telephone Encounter (Signed)
Called about scheduling an AWV -  Pt declined.

## 2017-10-26 ENCOUNTER — Ambulatory Visit (INDEPENDENT_AMBULATORY_CARE_PROVIDER_SITE_OTHER): Payer: Medicare Other | Admitting: Family Medicine

## 2017-10-26 ENCOUNTER — Encounter: Payer: Self-pay | Admitting: Family Medicine

## 2017-10-26 VITALS — BP 140/70 | HR 94 | Temp 98.2°F | Wt 213.8 lb

## 2017-10-26 DIAGNOSIS — F1190 Opioid use, unspecified, uncomplicated: Secondary | ICD-10-CM | POA: Diagnosis not present

## 2017-10-26 DIAGNOSIS — M0579 Rheumatoid arthritis with rheumatoid factor of multiple sites without organ or systems involvement: Secondary | ICD-10-CM | POA: Diagnosis not present

## 2017-10-26 MED ORDER — HYDROCODONE-ACETAMINOPHEN 5-325 MG PO TABS: ORAL_TABLET | ORAL | 0 refills | Status: DC

## 2017-10-26 NOTE — Progress Notes (Signed)
   Subjective:    Patient ID: Jaime Kelley, female    DOB: 05/28/1951, 67 y.o.   MRN: [id number]  HPI Here for pain management. She is doing fairly well.  Indication for chronic opioid: rheumatoid arthritis  Medication and dose: Norco 5-325 # pills per month: 120 Last UDS date: 10-26-17 Pain contract signed (Y/N): 10-26-17 Date narcotic database last reviewed (include red flags): 10-26-17    Review of Systems  Constitutional: Negative.   Respiratory: Negative.   Musculoskeletal: Positive for arthralgias.  Neurological: Negative.        Objective:   Physical Exam  Constitutional: She is oriented to person, place, and time. She appears well-developed and well-nourished.  Walks with a walker   Cardiovascular: Normal rate, regular rhythm, normal heart sounds and intact distal pulses.  Pulmonary/Chest: Effort normal and breath sounds normal. No respiratory distress. She has no wheezes. She has no rales.  Neurological: She is alert and oriented to person, place, and time.          Assessment & Plan:  Stable pain control, meds were refilled.  [name], MD

## 2017-10-27 ENCOUNTER — Encounter: Payer: Self-pay | Admitting: Family Medicine

## 2017-10-29 ENCOUNTER — Encounter: Payer: Self-pay | Admitting: Family Medicine

## 2017-10-30 LAB — PAIN MGMT, PROFILE 8 W/CONF, U
6 ACETYLMORPHINE: NEGATIVE ng/mL (ref <10)
ALCOHOL METABOLITES: NEGATIVE ng/mL (ref <500)
Amphetamines: NEGATIVE ng/mL (ref <500)
Benzodiazepines: NEGATIVE ng/mL (ref <100)
Buprenorphine, Urine: NEGATIVE ng/mL (ref <5)
COCAINE METABOLITE: NEGATIVE ng/mL (ref <150)
CODEINE: NEGATIVE ng/mL (ref <50)
CREATININE: 38.1 mg/dL
Hydrocodone: 689 ng/mL — ABNORMAL HIGH (ref <50)
MDMA: NEGATIVE ng/mL (ref <500)
MORPHINE: NEGATIVE ng/mL (ref <50)
Marijuana Metabolite: NEGATIVE ng/mL (ref <20)
NORHYDROCODONE: 742 ng/mL — AB (ref <50)
OXIDANT: NEGATIVE ug/mL (ref <200)
Opiates: POSITIVE ng/mL — AB (ref <100)
Oxycodone: NEGATIVE ng/mL (ref <100)
PH: 6.64 (ref 4.5–9.0)

## 2017-11-05 ENCOUNTER — Ambulatory Visit (INDEPENDENT_AMBULATORY_CARE_PROVIDER_SITE_OTHER): Payer: Medicare Other | Admitting: General Practice

## 2017-11-05 DIAGNOSIS — Z7901 Long term (current) use of anticoagulants: Secondary | ICD-10-CM | POA: Diagnosis not present

## 2017-11-05 DIAGNOSIS — I824Y9 Acute embolism and thrombosis of unspecified deep veins of unspecified proximal lower extremity: Secondary | ICD-10-CM | POA: Diagnosis not present

## 2017-11-05 LAB — POCT INR: INR: 2.4

## 2017-11-05 NOTE — Patient Instructions (Addendum)
Pre visit review using our clinic review tool, if applicable. No additional management support is needed unless otherwise documented below in the visit note.  Continue to take 7.5mg daily. Recheck in 4 weeks.  

## 2017-11-26 ENCOUNTER — Telehealth: Payer: Self-pay | Admitting: Family Medicine

## 2017-11-26 ENCOUNTER — Other Ambulatory Visit: Payer: Self-pay | Admitting: General Practice

## 2017-11-26 MED ORDER — WARFARIN SODIUM 7.5 MG PO TABS: ORAL_TABLET | ORAL | 1 refills | Status: DC

## 2017-11-26 NOTE — Telephone Encounter (Signed)
Copied from [place] [id number]. Topic: General - Other >> Nov 26, 2017  1:17 PM Darl Householder, RMA wrote: Reason for CRM: Medication refill for Warfarin 7.5 mg to be [facility] to [facility] [address]

## 2017-11-26 NOTE — Telephone Encounter (Signed)
Noted.  [facility]

## 2017-11-27 DIAGNOSIS — M150 Primary generalized (osteo)arthritis: Secondary | ICD-10-CM | POA: Diagnosis not present

## 2017-11-27 DIAGNOSIS — L409 Psoriasis, unspecified: Secondary | ICD-10-CM | POA: Diagnosis not present

## 2017-11-27 DIAGNOSIS — M1710 Unilateral primary osteoarthritis, unspecified knee: Secondary | ICD-10-CM | POA: Diagnosis not present

## 2017-11-27 DIAGNOSIS — Z79899 Other long term (current) drug therapy: Secondary | ICD-10-CM | POA: Diagnosis not present

## 2017-11-27 DIAGNOSIS — M25561 Pain in right knee: Secondary | ICD-10-CM | POA: Diagnosis not present

## 2017-11-27 DIAGNOSIS — M810 Age-related osteoporosis without current pathological fracture: Secondary | ICD-10-CM | POA: Diagnosis not present

## 2017-11-27 DIAGNOSIS — M0589 Other rheumatoid arthritis with rheumatoid factor of multiple sites: Secondary | ICD-10-CM | POA: Diagnosis not present

## 2017-11-27 DIAGNOSIS — M25511 Pain in right shoulder: Secondary | ICD-10-CM | POA: Diagnosis not present

## 2017-11-27 DIAGNOSIS — M5136 Other intervertebral disc degeneration, lumbar region: Secondary | ICD-10-CM | POA: Diagnosis not present

## 2017-11-27 DIAGNOSIS — L930 Discoid lupus erythematosus: Secondary | ICD-10-CM | POA: Diagnosis not present

## 2017-11-29 ENCOUNTER — Telehealth: Payer: Self-pay | Admitting: Family Medicine

## 2017-11-29 NOTE — Telephone Encounter (Signed)
See request. Last OV 10/26/17.Thanks.

## 2017-11-29 NOTE — Telephone Encounter (Signed)
Copied from [place]. Topic: Quick Communication - Rx Refill/Question >> Nov 29, 2017  2:02 PM Robina Ade, Helene Kelp D wrote: Medication: HYDROcodone-acetaminophen (NORCO/VICODIN) 5-325 MG tablet is backed order and pharmacy said it wont be available until March. They want to know if the doze can be increase or changed to something else? Has the patient contacted their pharmacy? Yes (Agent: If no, request that the patient contact the pharmacy for the refill.) Preferred Pharmacy (with phone number or street name): [id number] [place], [place]: Please be advised that RX refills may take up to 3 business days. We ask that you follow-up with your pharmacy.

## 2017-11-30 MED ORDER — HYDROCODONE-ACETAMINOPHEN 10-325 MG PO TABS: 0.5000 | ORAL_TABLET | Freq: Four times a day (QID) | ORAL | 0 refills | Status: DC | PRN

## 2017-11-30 NOTE — Telephone Encounter (Signed)
Sent to PCP ?

## 2017-11-30 NOTE — Telephone Encounter (Signed)
Called and spoke to pt. Pt advised and voiced understanding.  

## 2017-11-30 NOTE — Telephone Encounter (Signed)
[facility], can this be sent to [facility] on [address]? Please advise

## 2017-11-30 NOTE — Telephone Encounter (Signed)
We will send in one month of Norco 10-325 and she can take 1/2 tablet at a time.

## 2017-12-03 ENCOUNTER — Ambulatory Visit (INDEPENDENT_AMBULATORY_CARE_PROVIDER_SITE_OTHER): Payer: Medicare Other | Admitting: General Practice

## 2017-12-03 DIAGNOSIS — Z7901 Long term (current) use of anticoagulants: Secondary | ICD-10-CM

## 2017-12-03 LAB — POCT INR: INR: 2.1

## 2017-12-03 NOTE — Patient Instructions (Addendum)
Pre visit review using our clinic review tool, if applicable. No additional management support is needed unless otherwise documented below in the visit note.  Continue to take 7.5 mg daily.  Re-check in 6  weeks.  

## 2017-12-14 ENCOUNTER — Ambulatory Visit: Payer: Medicare Other | Admitting: Internal Medicine

## 2017-12-21 ENCOUNTER — Encounter: Payer: Self-pay | Admitting: Internal Medicine

## 2017-12-21 ENCOUNTER — Ambulatory Visit (INDEPENDENT_AMBULATORY_CARE_PROVIDER_SITE_OTHER): Payer: Medicare Other | Admitting: Internal Medicine

## 2017-12-21 VITALS — BP 122/90 | HR 87 | Ht 62.0 in | Wt 213.0 lb

## 2017-12-21 DIAGNOSIS — J449 Chronic obstructive pulmonary disease, unspecified: Secondary | ICD-10-CM | POA: Diagnosis not present

## 2017-12-21 DIAGNOSIS — R058 Other specified cough: Secondary | ICD-10-CM

## 2017-12-21 DIAGNOSIS — R05 Cough: Secondary | ICD-10-CM | POA: Diagnosis not present

## 2017-12-21 NOTE — Progress Notes (Signed)
Subjective:     Patient ID: Jaime Kelley, female   DOB: 12/22/1950,   MRN: [id number]     12 yowf  Quit smoking 01/18/17 with best days limited more by arthritis in knees and hips than by sob s need fo 02 and rarely saba for flares which typically occurred just once a year and PFT's  06/13/2017 c/w GOLD III COPD seen in pulmonary clinic  s/p admit:       Admission date:  01/18/2017   Discharge Date:  01/21/2017   Principal Problems:   Acute hypoxemic respiratory failure ([place]) Active Problems:   Hypothyroidism   TOBACCO ABUSE   COPD with emphysema ([place])   GERD   Rheumatoid arthritis ([place])   HTN (hypertension)   Type 2 diabetes mellitus ([place])        1)Acute Hypoxemic Respiratory Failure- patient was not previously on home O2, suspect hypoxia is secondary to COPD with acute exacerbation, influenza test and respiratory panel arenegative,  Treated with IV Solu-Medrol, bronchodilators and doxycycline.  okay to discharge home on oxygen and prednisone  2)Coag Neg Staph Bacteremia- coag-negative staph bacteremia, one out of 2 bottles suspect contaminant. No leukocytosis and patient does not look toxic.Lactic acid was normal. No fever  Or chills   3)HypoNatremia- improved, HCTZ has been stopped, sodium is 132, avoid excessive free water intake  4)H/o PE-  INR was elevated, INR was 5.8, Restarted Coumadin , INR today is < 2, Recheck INR Post discharge  5)DM-  Resume home rx  6)Tobacco Use Disorder- smoking cessation strongly advised  7)Rheumatoid Arthritis- continue methotrexate every weekly, ??? Patient has some degree of immunosuppression due to long-standing use of immunosuppressants  8)HTN- BP is improved, c/n Amlodipine and Lisinopril,    05/02/2017 1st [facility] office visit/ Jaime Kelley   Chief Complaint  Patient presents with  . Advice Only    Referred by Dr. Sarajane Jews due to being in the ICU 01/18/2017 for a total of 4 days, Dx of COPD. Breathing has improved since  hospital visit. SOB on exertion. Denies any cough or CP. Pt was sent home on 5L after hospital admission and went down to 2L. Pt still has oxygen tank but is not currently using it. DME: [facility].  using duoneb maybe twice a week and the hfa not all.  One flight of steps s gasping / sleeping fine  sats only drop walking up steps, not with adls or flat walking (slowly with walker)  C/o daytime tickle and sense of pnds but s excess/ purulent sputum or mucus plugs   rec Stop lisinipril and start avapro 300 mg (ibesartan)  one half daily  Plan B = Backup  Only use your albuterol (proair) as a rescue medication Plan C = Crisis - only use your albuterol-ipratropium  nebulizer if you first try Plan B and it fails to help > ok to use the nebulizer up to every 4 hours but if start needing it regularly call for immediate appointment Work on inhaler technique:  Keep 02 for now and use if your 02 stays below  90%     06/13/2017  f/u ov/Jaime Kelley re:  Not using any 02 at all even with exertion  Which is quite limited due to knees Chief Complaint  Patient presents with  . Follow-up    PFT's done today. Pt states her breathing is unchanged. She is coughing and having more congestion- not able to produce any sputum. She states she is using her albuterol inhaler 6 x  daily on average and has not used neb b/c she thought we told her to stop.   had not used saba at all day of ov/ more limited by arthritis than sob / not typically needing saba noct off 02  rec Plan A = Automatic =  Bevespi Take 2 puffs first thing in am and then another 2 puffs about 12 hours later.  Work on inhaler technique:  relax and gently blow all the way out then take a nice smooth deep breath back in, triggering the inhaler at same time you start breathing in.  Hold for up to 5 seconds if you can. Blow out thru nose. Rinse and gargle with water when done Plan B = Backup Only use your albuterol (proair)  Plan C = Crisis - only use your albuterol  nebulizer if you first try Plan B and it fails to help > ok to use the nebulizer up to every 4 hours but if start needing it regularly call for immediate appointment    12/21/2017  f/u ov/Jaime Kelley re: copd  GOLD III / no maint rx  Has 02, wants to keep it not needing regularly Chief Complaint  Patient presents with  . Follow-up    6 month ROV, coughing-productive    Dyspnea:  Knees stop her before her breathing/ lots of aches and pains Cough: assoc with pnds/ worse day > noct  Min slt beige assoc with nasal congestion x sev years Sleep: ok SABA use:  Not at all   No obvious day to day or daytime variability or assoc  mucus plugs or hemoptysis or cp or chest tightness, subjective wheeze or overt sinus or hb symptoms. No unusual exposure hx or h/o childhood pna/ asthma or knowledge of premature birth.  Sleeping ok flat without nocturnal  or early am exacerbation  of respiratory  c/o's or need for noct saba. Also denies any obvious fluctuation of symptoms with weather or environmental changes or other aggravating or alleviating factors except as outlined above   Current Allergies, Complete Past Medical History, Past Surgical History, Family History, and Social History were reviewed in [employer] record.  ROS  The following are not active complaints unless bolded Hoarseness, sore throat, dysphagia, dental problems, itching, sneezing,  nasal congestion or discharge of excess mucus or purulent secretions, ear ache,   fever, chills, sweats, unintended wt loss or wt gain, classically pleuritic or exertional cp,  orthopnea pnd or leg swelling, presyncope, palpitations, abdominal pain, anorexia, nausea, vomiting, diarrhea  or change in bowel habits or change in bladder habits, change in stools or change in urine, dysuria, hematuria,  rash, arthralgias, visual complaints, headache, numbness, weakness or ataxia or problems with walking or coordination,  change in mood/affect or memory.         Current Meds  Medication Sig  . albuterol (PROAIR HFA) 108 (90 Base) MCG/ACT inhaler Inhale 2 puffs into the lungs every 4 (four) hours as needed for wheezing.  Marland Kitchen amLODipine (NORVASC) 5 MG tablet Take 1 tablet (5 mg total) by mouth daily.  . DiphenhydrAMINE HCl (BENADRYL ALLERGY PO) Take by mouth.  . folic acid (FOLVITE) 1 MG tablet Take 1 mg by mouth daily.    . halobetasol (ULTRAVATE) 0.05 % cream Apply topically 2 (two) times daily.  Marland Kitchen HYDROcodone-acetaminophen (NORCO/VICODIN) 5-325 MG tablet TAKE 1 TABLET BY MOUTH EVERY 6 HOURS AS NEEDED FOR PAIN  . ipratropium-albuterol (DUONEB) 0.5-2.5 (3) MG/3ML SOLN Take 3 mLs by nebulization every 4 (four)  hours as needed.  . irbesartan (AVAPRO) 300 MG tablet Take 150 mg by mouth daily.  . metFORMIN (GLUCOPHAGE) 500 MG tablet Take 1 tablet (500 mg total) by mouth 2 (two) times daily.  . methotrexate (RHEUMATREX) 2.5 MG tablet Take 15 mg by mouth once a week. Take 6 tablets. Caution:Chemotherapy. Protect from light.  . sertraline (ZOLOFT) 100 MG tablet TAKE 1/2 TABLET BY MOUTH EVERY EVENING  . SYNTHROID 137 MCG tablet TAKE 1 TABLET (137 MCG TOTAL) BY MOUTH DAILY BEFORE BREAKFAST.  Marland Kitchen warfarin (COUMADIN) 7.5 MG tablet TAKE AS DIRECTED BY ANTICOAGULATION CLINIC. THIS IS A 90 DAY SUPPLY              Objective:   Physical Exam    amb obese wf 2 wheeled walker   12/21/2017          213  06/13/2017        206  05/02/17 202 lb 3.2 oz (91.7 kg)  03/15/17 203 lb (92.1 kg)  02/13/17 215 lb (97.5 kg)     Vital signs reviewed - Note on arrival 02 sats  95% on RA       full dntures LUNGS: no acc muscle use,  slt barrel contour chest with distant bs and bilateral faint exp >insp rhonchi bilaterally    HEENT: nl  turbinates bilaterally, and oropharynx. Nl external ear canals without cough reflex -full dentures    NECK :  without JVD/Nodes/TM/ nl carotid upstrokes bilaterally   LUNGS: no acc muscle use,  slt barrel contour chest/ distant bs  bilaterally with min late exp rhonchi    CV:  RRR  no s3 or murmur or increase in P2, and no edema   ABD:  soft and nontender with nl inspiratory excursion in the supine position. No bruits or organomegaly appreciated, bowel sounds nl  MS:  Very slow  Gait with walker/ ext warm without deformities, calf tenderness, cyanosis or clubbing No obvious joint restrictions   SKIN: warm and dry without lesions    NEURO:  alert, approp, nl sensorium with  no motor or cerebellar deficits apparent.              Assessment:

## 2017-12-21 NOTE — Patient Instructions (Signed)
Only use your albuterol Proair) as a rescue medication to be used if you can't catch your breath by resting or doing a relaxed purse lip breathing pattern.  - The less you use it, the better it will work when you need it. - Ok to use up to 2 puffs  every 4 hours if you must but call for immediate appointment if use goes up over your usual need - Don't leave home without it !!  (think of it like the spare tire for your car)    Work on inhaler technique:  relax and gently blow all the way out then take a nice smooth deep breath back in, triggering the inhaler at same time you start breathing in.  Hold for up to 5 seconds if you can. Blow out thru nose. Rinse and gargle with water when done       If you are satisfied with your treatment plan,  let your doctor know and he/she can either refill your medications or you can return here when your prescription runs out.     If in any way you are not 100% satisfied,  please tell us.  If 100% better, tell your friends!  Pulmonary follow up is as needed

## 2017-12-22 ENCOUNTER — Encounter: Payer: Self-pay | Admitting: Internal Medicine

## 2017-12-22 DIAGNOSIS — R058 Other specified cough: Secondary | ICD-10-CM | POA: Insufficient documentation

## 2017-12-22 DIAGNOSIS — R05 Cough: Secondary | ICD-10-CM | POA: Insufficient documentation

## 2017-12-22 NOTE — Assessment & Plan Note (Addendum)
acei d/ce 05/02/17 which helped   Chronic day > noct cough with sense of pnds c/w Upper airway cough syndrome (previously labeled PNDS),  is so named because it's frequently impossible to sort out how much is  CR/sinusitis with freq throat clearing (which can be related to primary GERD)   vs  causing  secondary (" extra esophageal")  GERD from wide swings in gastric pressure that occur with throat clearing, often  promoting self use of mint and menthol lozenges that reduce the lower esophageal sphincter tone and exacerbate the problem further in a cyclical fashion.   These are the same pts (now being labeled as having "irritable larynx syndrome" by some cough centers) who not infrequently have a history of having failed to tolerate ace inhibitors(which was the case here),  dry powder inhalers or biphosphonates or report having atypical/extraesophageal reflux symptoms that don't respond to standard doses of PPI  and are easily confused as having aecopd or asthma flares by even experienced allergists/ pulmonologists (myself included).  rec otc's prn and f/u here or with ent prn

## 2017-12-22 NOTE — Assessment & Plan Note (Signed)
Quit smoking at admit  01/18/17  - Spirometry 05/02/2017  FEV1 1.09 (46%)  Ratio 50 with classic curvature   - 05/02/2017  After extensive coaching HFA effectiveness =    75% > ok to just use saba prn  - PFT's  06/13/2017  FEV1 1.13 (47 % ) ratio 44  p 4 % improvement from saba p nothing  prior to study with DLCO  46/43 % corrects to 53  % for alv volume   - 06/13/2017    try bevespi 2bid > pt did not take consistently  - 12/21/2017  After extensive coaching inhaler device  effectiveness =    75% > rx saba prn and pulmonary f/u also prn   As I explained to this patient in detail:  although there may be copd present, it may not be clinically relevant:   it does not appear to be limiting activity tolerance any more than a set of worn tires limits someone from driving a car  around a parking lot.  A new set of Michelins might look good but would have no perceived impact on the performance of the car and would not be worth the cost.  That is to say:   this pt is so sedentary I don't recommend aggressive pulmonary rx at this point unless limiting symptoms arise or acute exacerbations become as issue, neither of which is the case now.  I asked the patient to contact this office at any time in the future should either of these problems arise.    Pulmonary f/u is prn

## 2018-01-09 ENCOUNTER — Other Ambulatory Visit: Payer: Self-pay | Admitting: Family Medicine

## 2018-01-09 NOTE — Telephone Encounter (Signed)
LAST ov 12/21/2017   Sent to PCP for approva

## 2018-01-14 ENCOUNTER — Ambulatory Visit (INDEPENDENT_AMBULATORY_CARE_PROVIDER_SITE_OTHER): Payer: Medicare Other | Admitting: General Practice

## 2018-01-14 DIAGNOSIS — Z7901 Long term (current) use of anticoagulants: Secondary | ICD-10-CM

## 2018-01-14 LAB — POCT INR: INR: 1.7

## 2018-01-14 NOTE — Patient Instructions (Addendum)
Pre visit review using our clinic review tool, if applicable. No additional management support is needed unless otherwise documented below in the visit note.  Take 1 1/2 tablets today and then continue to take 7.5 mg daily.  Re-check in 4  weeks.

## 2018-02-05 ENCOUNTER — Ambulatory Visit (INDEPENDENT_AMBULATORY_CARE_PROVIDER_SITE_OTHER): Payer: Medicare Other | Admitting: Family Medicine

## 2018-02-05 ENCOUNTER — Encounter: Payer: Self-pay | Admitting: Family Medicine

## 2018-02-05 VITALS — BP 160/90 | HR 61 | Temp 98.4°F | Ht 62.0 in | Wt 216.2 lb

## 2018-02-05 DIAGNOSIS — M0579 Rheumatoid arthritis with rheumatoid factor of multiple sites without organ or systems involvement: Secondary | ICD-10-CM | POA: Diagnosis not present

## 2018-02-05 DIAGNOSIS — F1190 Opioid use, unspecified, uncomplicated: Secondary | ICD-10-CM | POA: Diagnosis not present

## 2018-02-05 MED ORDER — HYDROCODONE-ACETAMINOPHEN 5-325 MG PO TABS: 1.0000 | ORAL_TABLET | Freq: Four times a day (QID) | ORAL | 0 refills | Status: DC | PRN

## 2018-02-05 NOTE — Progress Notes (Signed)
   Subjective:    Patient ID: Jaime Kelley, female    DOB: 08/10/1951, 67 y.o.   MRN: [id number]  HPI Here for a pain management visit. Her arthritis pains wax and wane according to the weather.  Indication for chronic opioid: rheumatoid arthritis Medication and dose: Norco 5-325  # pills per month: 120 Last UDS date: 10-26-17 Opioid Treatment Agreement signed (Y/N): 02-05-18 Opioid Treatment Agreement last reviewed with patient:  02-05-18 N[facility] reviewed this encounter (include red flags):  02-05-18     Review of Systems  Constitutional: Negative.   Respiratory: Negative.   Cardiovascular: Negative.   Musculoskeletal: Positive for arthralgias.  Neurological: Negative.        Objective:   Physical Exam  Constitutional: She is oriented to person, place, and time. She appears well-developed and well-nourished.  Cardiovascular: Normal rate, regular rhythm, normal heart sounds and intact distal pulses.  Pulmonary/Chest: Effort normal and breath sounds normal. No respiratory distress. She has no wheezes. She has no rales.  Neurological: She is alert and oriented to person, place, and time.          Assessment & Plan:  Pain management. Meds were refilled. [name], MD

## 2018-02-11 ENCOUNTER — Ambulatory Visit (INDEPENDENT_AMBULATORY_CARE_PROVIDER_SITE_OTHER): Payer: Medicare Other | Admitting: General Practice

## 2018-02-11 DIAGNOSIS — Z7901 Long term (current) use of anticoagulants: Secondary | ICD-10-CM | POA: Diagnosis not present

## 2018-02-11 LAB — POCT INR: INR: 2

## 2018-02-11 NOTE — Patient Instructions (Addendum)
Pre visit review using our clinic review tool, if applicable. No additional management support is needed unless otherwise documented below in the visit note.  Take extra 1/2 tablet today and then continue to take 7.5 mg daily.  Re-check in 4  weeks.

## 2018-03-11 ENCOUNTER — Ambulatory Visit (INDEPENDENT_AMBULATORY_CARE_PROVIDER_SITE_OTHER): Payer: Medicare Other | Admitting: General Practice

## 2018-03-11 DIAGNOSIS — Z7901 Long term (current) use of anticoagulants: Secondary | ICD-10-CM

## 2018-03-11 LAB — POCT INR: INR: 2.4 (ref 2.0–3.0)

## 2018-03-11 NOTE — Patient Instructions (Addendum)
Pre visit review using our clinic review tool, if applicable. No additional management support is needed unless otherwise documented below in the visit note.  Take extra 1/2 tablet today and then continue to take 7.5 mg daily.  Re-check in 4  weeks.

## 2018-03-15 DIAGNOSIS — M5136 Other intervertebral disc degeneration, lumbar region: Secondary | ICD-10-CM | POA: Diagnosis not present

## 2018-03-15 DIAGNOSIS — M150 Primary generalized (osteo)arthritis: Secondary | ICD-10-CM | POA: Diagnosis not present

## 2018-03-15 DIAGNOSIS — M25561 Pain in right knee: Secondary | ICD-10-CM | POA: Diagnosis not present

## 2018-03-15 DIAGNOSIS — M810 Age-related osteoporosis without current pathological fracture: Secondary | ICD-10-CM | POA: Diagnosis not present

## 2018-03-15 DIAGNOSIS — M0589 Other rheumatoid arthritis with rheumatoid factor of multiple sites: Secondary | ICD-10-CM | POA: Diagnosis not present

## 2018-03-15 DIAGNOSIS — L409 Psoriasis, unspecified: Secondary | ICD-10-CM | POA: Diagnosis not present

## 2018-03-15 DIAGNOSIS — Z79899 Other long term (current) drug therapy: Secondary | ICD-10-CM | POA: Diagnosis not present

## 2018-03-15 DIAGNOSIS — M1710 Unilateral primary osteoarthritis, unspecified knee: Secondary | ICD-10-CM | POA: Diagnosis not present

## 2018-03-15 DIAGNOSIS — L930 Discoid lupus erythematosus: Secondary | ICD-10-CM | POA: Diagnosis not present

## 2018-03-18 ENCOUNTER — Other Ambulatory Visit: Payer: Self-pay | Admitting: Family Medicine

## 2018-04-08 ENCOUNTER — Ambulatory Visit (INDEPENDENT_AMBULATORY_CARE_PROVIDER_SITE_OTHER): Payer: Medicare Other | Admitting: General Practice

## 2018-04-08 DIAGNOSIS — Z7901 Long term (current) use of anticoagulants: Secondary | ICD-10-CM

## 2018-04-08 LAB — POCT INR: INR: 2.8 (ref 2.0–3.0)

## 2018-04-08 NOTE — Patient Instructions (Addendum)
Pre visit review using our clinic review tool, if applicable. No additional management support is needed unless otherwise documented below in the visit note.  Continue to take 7.5 mg daily.  Re-check in 6  weeks.  

## 2018-04-15 ENCOUNTER — Ambulatory Visit (INDEPENDENT_AMBULATORY_CARE_PROVIDER_SITE_OTHER): Payer: Medicare Other | Admitting: Family Medicine

## 2018-04-15 ENCOUNTER — Encounter: Payer: Self-pay | Admitting: Family Medicine

## 2018-04-15 VITALS — BP 160/84 | HR 96 | Temp 98.7°F | Ht 62.0 in | Wt 219.2 lb

## 2018-04-15 DIAGNOSIS — F1190 Opioid use, unspecified, uncomplicated: Secondary | ICD-10-CM | POA: Diagnosis not present

## 2018-04-15 DIAGNOSIS — M0579 Rheumatoid arthritis with rheumatoid factor of multiple sites without organ or systems involvement: Secondary | ICD-10-CM | POA: Diagnosis not present

## 2018-04-15 MED ORDER — HYDROCODONE-ACETAMINOPHEN 5-325 MG PO TABS: 1.0000 | ORAL_TABLET | Freq: Four times a day (QID) | ORAL | 0 refills | Status: DC | PRN

## 2018-04-15 MED ORDER — AMLODIPINE BESYLATE 10 MG PO TABS: 10.0000 mg | ORAL_TABLET | Freq: Every day | ORAL | 3 refills | Status: DC

## 2018-04-15 MED ORDER — HYDROCODONE-ACETAMINOPHEN 5-325 MG PO TABS: 1.0000 | ORAL_TABLET | Freq: Four times a day (QID) | ORAL | 0 refills | Status: AC | PRN

## 2018-04-15 NOTE — Progress Notes (Signed)
   Subjective:    Patient ID: Jaime Kelley, female    DOB: 1951/10/21, 67 y.o.   MRN: [id number]  HPI Here for pain management. She is doing well. Her BP has been running a bit high. Her Rheumatologist is trying to get insurance approval for her to use Humira in addition to methotrexate.  Indication for chronic opioid: rheumatoid arthritis  Medication and dose: Norco 5/325  # pills per month: 120 Last UDS date: 10-26-17 Opioid Treatment Agreement signed (Y/N): 10-26-17 Opioid Treatment Agreement last reviewed with patient:  04-15-18 [place] reviewed this encounter (include red flags):  04-15-18     Review of Systems  Constitutional: Negative.   Respiratory: Negative.   Cardiovascular: Negative.   Musculoskeletal: Positive for arthralgias.  Neurological: Negative.        Objective:   Physical Exam  Constitutional: She is oriented to person, place, and time. She appears well-developed and well-nourished.  Walks with a walker   Cardiovascular: Normal rate, regular rhythm, normal heart sounds and intact distal pulses.  Pulmonary/Chest: Effort normal and breath sounds normal.  Neurological: She is alert and oriented to person, place, and time.          Assessment & Plan:  For pain management, refills were written. For HTN we will increase the Amlodipine to 10 mg daily.  [name], MD

## 2018-05-20 ENCOUNTER — Ambulatory Visit (INDEPENDENT_AMBULATORY_CARE_PROVIDER_SITE_OTHER): Payer: Medicare Other | Admitting: General Practice

## 2018-05-20 DIAGNOSIS — Z7901 Long term (current) use of anticoagulants: Secondary | ICD-10-CM

## 2018-05-20 LAB — POCT INR: INR: 2.4 (ref 2.0–3.0)

## 2018-05-20 NOTE — Patient Instructions (Addendum)
Pre visit review using our clinic review tool, if applicable. No additional management support is needed unless otherwise documented below in the visit note.  Continue to take 7.5 mg daily.  Re-check in 6  weeks.  

## 2018-06-01 ENCOUNTER — Other Ambulatory Visit: Payer: Self-pay | Admitting: Family Medicine

## 2018-06-03 NOTE — Telephone Encounter (Signed)
Sent to [facility]

## 2018-07-01 ENCOUNTER — Ambulatory Visit (INDEPENDENT_AMBULATORY_CARE_PROVIDER_SITE_OTHER): Payer: Medicare Other | Admitting: General Practice

## 2018-07-01 DIAGNOSIS — Z7901 Long term (current) use of anticoagulants: Secondary | ICD-10-CM | POA: Diagnosis not present

## 2018-07-01 LAB — POCT INR: INR: 2.5 (ref 2.0–3.0)

## 2018-07-01 NOTE — Patient Instructions (Signed)
Pre visit review using our clinic review tool, if applicable. No additional management support is needed unless otherwise documented below in the visit note.  Continue to take 7.5 mg daily.  Re-check in 6  weeks.  

## 2018-07-17 ENCOUNTER — Ambulatory Visit: Payer: Medicare Other | Admitting: Family Medicine

## 2018-07-19 ENCOUNTER — Encounter: Payer: Self-pay | Admitting: Family Medicine

## 2018-07-19 ENCOUNTER — Ambulatory Visit (INDEPENDENT_AMBULATORY_CARE_PROVIDER_SITE_OTHER): Payer: Medicare Other | Admitting: Family Medicine

## 2018-07-19 VITALS — BP 124/70 | HR 92 | Temp 98.2°F | Wt 222.5 lb

## 2018-07-19 DIAGNOSIS — M0579 Rheumatoid arthritis with rheumatoid factor of multiple sites without organ or systems involvement: Secondary | ICD-10-CM

## 2018-07-19 DIAGNOSIS — Z23 Encounter for immunization: Secondary | ICD-10-CM | POA: Diagnosis not present

## 2018-07-19 DIAGNOSIS — F1190 Opioid use, unspecified, uncomplicated: Secondary | ICD-10-CM

## 2018-07-19 MED ORDER — HYDROCODONE-ACETAMINOPHEN 5-325 MG PO TABS: 1.0000 | ORAL_TABLET | Freq: Four times a day (QID) | ORAL | 0 refills | Status: DC | PRN

## 2018-07-19 MED ORDER — HYDROCODONE-ACETAMINOPHEN 5-325 MG PO TABS: 1.0000 | ORAL_TABLET | Freq: Four times a day (QID) | ORAL | 0 refills | Status: AC | PRN

## 2018-07-19 NOTE — Addendum Note (Signed)
Addended by: Elie Confer on: 07/19/2018 01:55 PM   Modules accepted: Orders

## 2018-07-19 NOTE — Progress Notes (Signed)
   Subjective:    Patient ID: Jaime Kelley, female    DOB: [birth year], 67 y.o.   MRN: [id number]  HPI Here for pain management. She is doing well. She sees Rheumatology again next month.  Indication for chronic opioid: rheumatoid arthritis  Medication and dose: Norco 5-325 # pills per month: 120 Last UDS date: 10-26-17 Opioid Treatment Agreement signed (Y/N): 10-26-17 Opioid Treatment Agreement last reviewed with patient:  07-19-18 [place] reviewed this encounter (include red flags):  07-19-18    Review of Systems  Constitutional: Negative.   Respiratory: Negative.   Cardiovascular: Negative.   Musculoskeletal: Positive for arthralgias.  Neurological: Negative.        Objective:   Physical Exam  Constitutional: She is oriented to person, place, and time. She appears well-developed and well-nourished.  Uses a walker   Cardiovascular: Normal rate, regular rhythm, normal heart sounds and intact distal pulses.  Pulmonary/Chest: Effort normal and breath sounds normal.  Neurological: She is alert and oriented to person, place, and time.          Assessment & Plan:  Pain management. meds were refilled.  Jaime Penna, MD

## 2018-08-11 ENCOUNTER — Other Ambulatory Visit: Payer: Self-pay | Admitting: Family Medicine

## 2018-08-12 ENCOUNTER — Ambulatory Visit (INDEPENDENT_AMBULATORY_CARE_PROVIDER_SITE_OTHER): Payer: Medicare Other | Admitting: General Practice

## 2018-08-12 DIAGNOSIS — Z7901 Long term (current) use of anticoagulants: Secondary | ICD-10-CM

## 2018-08-12 DIAGNOSIS — Z23 Encounter for immunization: Secondary | ICD-10-CM

## 2018-08-12 LAB — POCT INR: INR: 2.8 (ref 2.0–3.0)

## 2018-08-12 NOTE — Patient Instructions (Addendum)
Pre visit review using our clinic review tool, if applicable. No additional management support is needed unless otherwise documented below in the visit note.  Continue to take 7.5 mg daily.  Re-check in 6  weeks.  

## 2018-08-14 ENCOUNTER — Other Ambulatory Visit: Payer: Self-pay | Admitting: Family Medicine

## 2018-08-15 ENCOUNTER — Other Ambulatory Visit: Payer: Self-pay | Admitting: Family Medicine

## 2018-08-16 ENCOUNTER — Other Ambulatory Visit: Payer: Self-pay | Admitting: Family Medicine

## 2018-08-16 DIAGNOSIS — Z1231 Encounter for screening mammogram for malignant neoplasm of breast: Secondary | ICD-10-CM

## 2018-08-19 ENCOUNTER — Other Ambulatory Visit: Payer: Self-pay | Admitting: General Practice

## 2018-08-19 ENCOUNTER — Encounter: Payer: Self-pay | Admitting: Podiatry

## 2018-08-19 ENCOUNTER — Ambulatory Visit (INDEPENDENT_AMBULATORY_CARE_PROVIDER_SITE_OTHER): Payer: Medicare Other | Admitting: Podiatry

## 2018-08-19 VITALS — BP 158/98 | HR 90

## 2018-08-19 DIAGNOSIS — E0843 Diabetes mellitus due to underlying condition with diabetic autonomic (poly)neuropathy: Secondary | ICD-10-CM

## 2018-08-19 DIAGNOSIS — M79676 Pain in unspecified toe(s): Secondary | ICD-10-CM

## 2018-08-19 DIAGNOSIS — B351 Tinea unguium: Secondary | ICD-10-CM

## 2018-08-19 MED ORDER — WARFARIN SODIUM 7.5 MG PO TABS: ORAL_TABLET | ORAL | 1 refills | Status: DC

## 2018-08-20 NOTE — Progress Notes (Signed)
   SUBJECTIVE Patient with a history of diabetes mellitus presents to office today complaining of elongated, thickened nails that cause pain while ambulating in shoes. She is unable to trim her own nails. She also reports her right great toenail came off four days ago. She denies pain but states it feels "weird". She has been applying Neosporin and a bandage. Patient is here for further evaluation and treatment.   Past Medical History:  Diagnosis Date  . COPD (chronic obstructive pulmonary disease) ([place])   . Discoid lupus    sees Dr. Allyson Sabal  . GERD (gastroesophageal reflux disease)   . History of colonic polyps   . Hx pulmonary embolism   . Hypothyroidism   . Rheumatoid arthritis(714.0)    sees Dr. Dossie Der   . Thyroid disease     OBJECTIVE General Patient is awake, alert, and oriented x 3 and in no acute distress. Derm Skin is dry and supple bilateral. Negative open lesions or macerations. Remaining integument unremarkable. Nails are tender, long, thickened and dystrophic with subungual debris, consistent with onychomycosis, 1-5 left, 2-5 right. No signs of infection noted. Vasc  DP and PT pedal pulses palpable bilaterally. Temperature gradient within normal limits.  Neuro Epicritic and protective threshold sensation diminished bilaterally.  Musculoskeletal Exam No symptomatic pedal deformities noted bilateral. Muscular strength within normal limits.  ASSESSMENT 1. Diabetes Mellitus w/ peripheral neuropathy 2. Onychomycosis of nail due to dermatophyte bilateral 3. Loss of toenail right hallux   PLAN OF CARE 1. Patient evaluated today. 2. Instructed to maintain good pedal hygiene and foot care. Stressed importance of controlling blood sugar.  3. Mechanical debridement of nails 1-5 left, 2-5 right performed using a nail nipper. Filed with dremel without incident.  4. Return to clinic in 3 mos.     Edrick Kins, DPM [facility]  Dr. Edrick Kins, [address]                                        [place], [place] [address]                Office [phone]  Fax [phone]

## 2018-08-21 ENCOUNTER — Telehealth: Payer: Self-pay | Admitting: Family Medicine

## 2018-08-21 NOTE — Telephone Encounter (Signed)
Copied from [place] [id number]. Topic: Quick Communication - Rx Refill/Question >> Aug 21, 2018 12:49 PM Burchel, Abbi R wrote: Medication: irbesartan (AVAPRO) 300 MG tablet   Preferred Pharmacy: [id number] [address], [place] [address] Phone: [phone] Fax: [phone]  Pt states this medication is on back order and [facility] does not know if/when [facility] will be available. Pt is requesting an alternative as she is completely out of medication.   Pt: [phone]

## 2018-08-21 NOTE — Telephone Encounter (Signed)
Dr. Sarajane Jews please advise of alternative as this medication is on back order.  Thanks

## 2018-08-23 MED ORDER — LOSARTAN POTASSIUM 100 MG PO TABS: 100.0000 mg | ORAL_TABLET | Freq: Every day | ORAL | 3 refills | Status: DC

## 2018-08-23 NOTE — Telephone Encounter (Signed)
Patient is aware 

## 2018-08-23 NOTE — Telephone Encounter (Signed)
Change this to Losartan 100 mg to take daily. Call in #[phone] with 3 rf

## 2018-08-23 NOTE — Telephone Encounter (Signed)
I have sent the new medication into the pharmacy and I have called and lmom to make the pt aware.

## 2018-09-05 ENCOUNTER — Ambulatory Visit
Admission: RE | Admit: 2018-09-05 | Discharge: 2018-09-05 | Disposition: A | Discharge status: Home / Self Care | Payer: Medicare Other | Source: Ambulatory Visit | Attending: Family Medicine | Admitting: Family Medicine

## 2018-09-05 DIAGNOSIS — Z1231 Encounter for screening mammogram for malignant neoplasm of breast: Secondary | ICD-10-CM | POA: Diagnosis not present

## 2018-09-12 DIAGNOSIS — Z79899 Other long term (current) drug therapy: Secondary | ICD-10-CM | POA: Diagnosis not present

## 2018-09-12 DIAGNOSIS — M1710 Unilateral primary osteoarthritis, unspecified knee: Secondary | ICD-10-CM | POA: Diagnosis not present

## 2018-09-12 DIAGNOSIS — L409 Psoriasis, unspecified: Secondary | ICD-10-CM | POA: Diagnosis not present

## 2018-09-12 DIAGNOSIS — L930 Discoid lupus erythematosus: Secondary | ICD-10-CM | POA: Diagnosis not present

## 2018-09-12 DIAGNOSIS — M810 Age-related osteoporosis without current pathological fracture: Secondary | ICD-10-CM | POA: Diagnosis not present

## 2018-09-12 DIAGNOSIS — M5136 Other intervertebral disc degeneration, lumbar region: Secondary | ICD-10-CM | POA: Diagnosis not present

## 2018-09-12 DIAGNOSIS — M0589 Other rheumatoid arthritis with rheumatoid factor of multiple sites: Secondary | ICD-10-CM | POA: Diagnosis not present

## 2018-09-12 DIAGNOSIS — M25561 Pain in right knee: Secondary | ICD-10-CM | POA: Diagnosis not present

## 2018-09-12 DIAGNOSIS — M150 Primary generalized (osteo)arthritis: Secondary | ICD-10-CM | POA: Diagnosis not present

## 2018-09-23 ENCOUNTER — Ambulatory Visit (INDEPENDENT_AMBULATORY_CARE_PROVIDER_SITE_OTHER): Payer: Medicare Other | Admitting: General Practice

## 2018-09-23 DIAGNOSIS — Z7901 Long term (current) use of anticoagulants: Secondary | ICD-10-CM

## 2018-09-23 LAB — POCT INR: INR: 2.8 (ref 2.0–3.0)

## 2018-09-23 NOTE — Patient Instructions (Addendum)
Pre visit review using our clinic review tool, if applicable. No additional management support is needed unless otherwise documented below in the visit note.  Continue to take 7.5 mg daily.  Re-check in 6  weeks.  

## 2018-10-24 ENCOUNTER — Encounter: Payer: Self-pay | Admitting: Family Medicine

## 2018-10-24 ENCOUNTER — Ambulatory Visit (INDEPENDENT_AMBULATORY_CARE_PROVIDER_SITE_OTHER): Payer: Medicare Other | Admitting: Family Medicine

## 2018-10-24 VITALS — BP 122/78 | HR 99 | Temp 98.1°F | Wt 227.2 lb

## 2018-10-24 DIAGNOSIS — F1190 Opioid use, unspecified, uncomplicated: Secondary | ICD-10-CM | POA: Diagnosis not present

## 2018-10-24 DIAGNOSIS — M0579 Rheumatoid arthritis with rheumatoid factor of multiple sites without organ or systems involvement: Secondary | ICD-10-CM

## 2018-10-24 MED ORDER — HYDROCODONE-ACETAMINOPHEN 5-325 MG PO TABS: 1.0000 | ORAL_TABLET | Freq: Four times a day (QID) | ORAL | 0 refills | Status: AC | PRN

## 2018-10-24 MED ORDER — HYDROCODONE-ACETAMINOPHEN 5-325 MG PO TABS: 1.0000 | ORAL_TABLET | Freq: Four times a day (QID) | ORAL | 0 refills | Status: DC | PRN

## 2018-10-24 NOTE — Progress Notes (Signed)
   Subjective:    Patient ID: Jaime Kelley, female    DOB: 09-Jan-1951, 68 y.o.   MRN: [id number]  HPI Here for pain management. She is doing well.  Indication for chronic opioid: rheumatoid arthritis  Medication and dose: Norco 5-325 # pills per month: 120 Last UDS date: 10-26-17 Opioid Treatment Agreement signed (Y/N): 10-26-17 Opioid Treatment Agreement last reviewed with patient:  10-24-18 N[facility] reviewed this encounter (include red flags):  10-24-08    Review of Systems  Constitutional: Negative.   Respiratory: Negative.   Cardiovascular: Negative.   Musculoskeletal: Positive for arthralgias.  Neurological: Negative.        Objective:   Physical Exam Constitutional:      Appearance: Normal appearance.  Cardiovascular:     Rate and Rhythm: Normal rate and regular rhythm.     Pulses: Normal pulses.     Heart sounds: Normal heart sounds.  Pulmonary:     Effort: Pulmonary effort is normal.     Breath sounds: Normal breath sounds.  Neurological:     General: No focal deficit present.     Mental Status: She is alert and oriented to person, place, and time.           Assessment & Plan:  Pain management, meds were refilled.  [name], MD

## 2018-10-27 LAB — PAIN MGMT, PROFILE 8 W/CONF, U
6 ACETYLMORPHINE: NEGATIVE ng/mL (ref <10)
ALCOHOL METABOLITES: NEGATIVE ng/mL (ref <500)
AMPHETAMINES: NEGATIVE ng/mL (ref <500)
Benzodiazepines: NEGATIVE ng/mL (ref <100)
Buprenorphine, Urine: NEGATIVE ng/mL (ref <5)
CREATININE: 86.7 mg/dL
Cocaine Metabolite: NEGATIVE ng/mL (ref <150)
Codeine: NEGATIVE ng/mL (ref <50)
Hydrocodone: 1409 ng/mL — ABNORMAL HIGH (ref <50)
Hydromorphone: 139 ng/mL — ABNORMAL HIGH (ref <50)
MDMA: NEGATIVE ng/mL (ref <500)
Marijuana Metabolite: NEGATIVE ng/mL (ref <20)
Morphine: NEGATIVE ng/mL (ref <50)
NORHYDROCODONE: 1383 ng/mL — AB (ref <50)
OPIATES: POSITIVE ng/mL — AB (ref <100)
OXIDANT: NEGATIVE ug/mL (ref <200)
OXYCODONE: NEGATIVE ng/mL (ref <100)
PH: 7.25 (ref 4.5–9.0)

## 2018-11-04 ENCOUNTER — Ambulatory Visit (INDEPENDENT_AMBULATORY_CARE_PROVIDER_SITE_OTHER): Payer: Medicare Other | Admitting: General Practice

## 2018-11-04 DIAGNOSIS — Z7901 Long term (current) use of anticoagulants: Secondary | ICD-10-CM

## 2018-11-04 LAB — POCT INR: INR: 3.1 — AB (ref 2.0–3.0)

## 2018-11-04 NOTE — Patient Instructions (Addendum)
Pre visit review using our clinic review tool, if applicable. No additional management support is needed unless otherwise documented below in the visit note.  Take 1/2 tablet today and then continue to take 7.5 mg daily.  Re-check in 6  weeks.

## 2018-11-09 ENCOUNTER — Other Ambulatory Visit: Payer: Self-pay | Admitting: Family Medicine

## 2018-11-12 ENCOUNTER — Other Ambulatory Visit: Payer: Self-pay | Admitting: Family Medicine

## 2018-11-18 ENCOUNTER — Ambulatory Visit (INDEPENDENT_AMBULATORY_CARE_PROVIDER_SITE_OTHER): Payer: Medicare Other | Admitting: Podiatry

## 2018-11-18 ENCOUNTER — Other Ambulatory Visit: Payer: Self-pay | Admitting: Family Medicine

## 2018-11-18 ENCOUNTER — Encounter: Payer: Self-pay | Admitting: Podiatry

## 2018-11-18 DIAGNOSIS — E1142 Type 2 diabetes mellitus with diabetic polyneuropathy: Secondary | ICD-10-CM | POA: Diagnosis not present

## 2018-11-18 DIAGNOSIS — M79676 Pain in unspecified toe(s): Secondary | ICD-10-CM

## 2018-11-18 DIAGNOSIS — B351 Tinea unguium: Secondary | ICD-10-CM

## 2018-11-18 DIAGNOSIS — L84 Corns and callosities: Secondary | ICD-10-CM | POA: Diagnosis not present

## 2018-11-18 NOTE — Patient Instructions (Signed)

## 2018-11-18 NOTE — Progress Notes (Signed)
Subjective: Jaime Kelley presents today for preventative diabetic foot care.  She has history of diabetes.  She is seen for follow-up of painful, mycotic toenails and also notes corn on her left fifth toe.  She voices no other concerns on today's visit.  She is also on chronic anticoagulants.   Laurey Morale, MD is her PCP.  Her last visit with him was October 24, 2018.   Current Outpatient Medications:  .  amLODipine (NORVASC) 10 MG tablet, Take 1 tablet (10 mg total) by mouth daily., Disp: 90 tablet, Rfl: 3 .  DiphenhydrAMINE HCl (BENADRYL ALLERGY PO), Take by mouth., Disp: , Rfl:  .  folic acid (FOLVITE) 1 MG tablet, Take 1 mg by mouth daily.  , Disp: , Rfl:  .  halobetasol (ULTRAVATE) 0.05 % cream, Apply topically 2 (two) times daily., Disp: 50 g, Rfl: 2 .  [START ON 12/23/2018] HYDROcodone-acetaminophen (NORCO) 5-325 MG tablet, Take 1 tablet by mouth every 6 (six) hours as needed for moderate pain., Disp: 120 tablet, Rfl: 0 .  ipratropium-albuterol (DUONEB) 0.5-2.5 (3) MG/3ML SOLN, Take 3 mLs by nebulization every 4 (four) hours as needed., Disp: , Rfl:  .  losartan (COZAAR) 100 MG tablet, Take 1 tablet (100 mg total) by mouth daily., Disp: 90 tablet, Rfl: 3 .  metFORMIN (GLUCOPHAGE) 500 MG tablet, TAKE 1 TABLET BY MOUTH TWICE A DAY, Disp: 180 tablet, Rfl: 0 .  methotrexate (RHEUMATREX) 2.5 MG tablet, Take 15 mg by mouth once a week. Take 6 tablets. Caution:Chemotherapy. Protect from light., Disp: , Rfl:  .  PROAIR HFA 108 (90 Base) MCG/ACT inhaler, INHALE 2 PUFFS INTO THE LUNGS EVERY 4 (FOUR) HOURS AS NEEDED FOR WHEEZING., Disp: 8.5 Inhaler, Rfl: 0 .  sertraline (ZOLOFT) 100 MG tablet, TAKE 1/2 TABLET BY MOUTH EVERY EVENING, Disp: , Rfl:  .  SYNTHROID 137 MCG tablet, TAKE 1 TABLET (137 MCG TOTAL) BY MOUTH DAILY BEFORE BREAKFAST., Disp: 90 tablet, Rfl: 1 .  warfarin (COUMADIN) 7.5 MG tablet, Take 1 tablet daily or AS DIRECTED BY ANTICOAGULATION CLINIC, Disp: 90 tablet, Rfl:  1  Allergies  Allergen Reactions  . Venlafaxine Rash  . Effexor [Venlafaxine Hydrochloride] Rash    Vascular Examination: Capillary refill time <3 seconds x 10 digits Dorsalis pedis and Posterior tibial pulses present b/l No digital hair x 10 digits Skin temperature gradient within normal limits bilaterally  Dermatological Examination: Skin with normal turgor, texture and tone b/l  Toenails 1-5 bilaterally are discolored, thick, dystrophic with subungual debris and pain with palpation to nailbeds due to thickness of nails.  Hyperkeratotic lesion dorsal aspect left fifth digit PIPJ.  There is no edema, no erythema, no drainage, no flocculence noted.  Musculoskeletal: Muscle strength 5/5 to all LE muscle groups  Neurological: Sensation diminished with 10 g monofilament.  Assessment: 1. Painful onychomycosis toenails 1-5 b/l 2. Dorsal corn left fifth digit 3. NIDDM with Diabetic neuropathy  Plan: 1. Continue diabetic foot care principles.  2. Toenails 1-5 b/l were debrided in length and girth without iatrogenic bleeding. 3. Hyperkeratotic lesion pared with sterile chisel blade left fifth digit  4. Patient to continue soft, supportive shoe gear 5. Patient to report any pedal injuries to medical professional  6. Follow up 3 months. Patient/POA to call should there be a concern in the interim.

## 2018-11-19 ENCOUNTER — Encounter: Payer: Self-pay | Admitting: Family Medicine

## 2018-11-28 IMAGING — MG DIGITAL SCREENING BILATERAL MAMMOGRAM WITH TOMO AND CAD
8 series · 8 of 24 positions shown · non-contrast
Comparison: Previous exam(s).

CLINICAL DATA: Screening.

EXAM:
DIGITAL SCREENING BILATERAL MAMMOGRAM WITH TOMO AND CAD

[L MLO synth-2D]
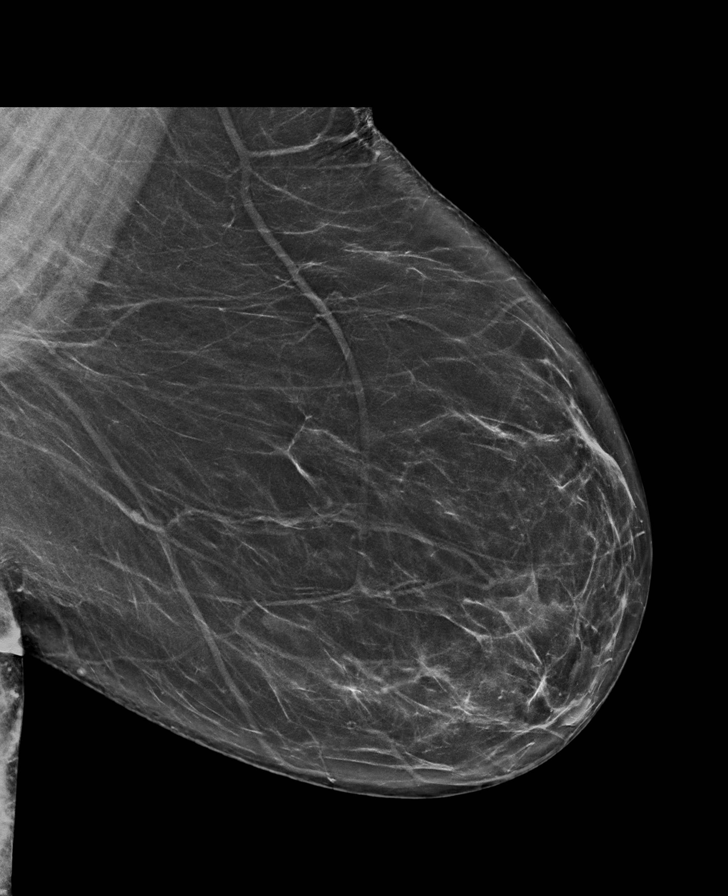

[R MLO synth-2D]
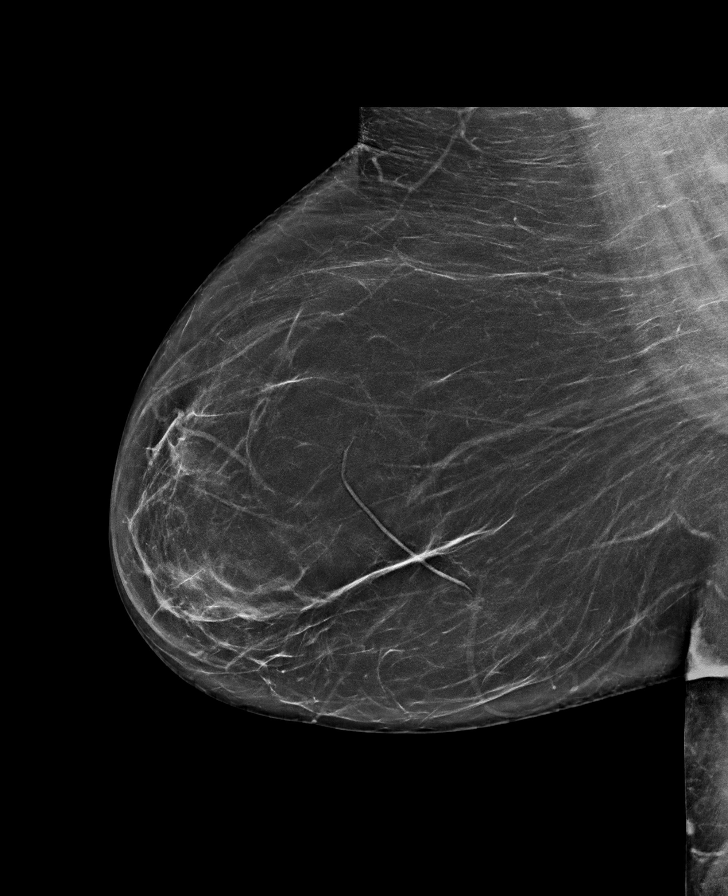

[R CC synth-2D]
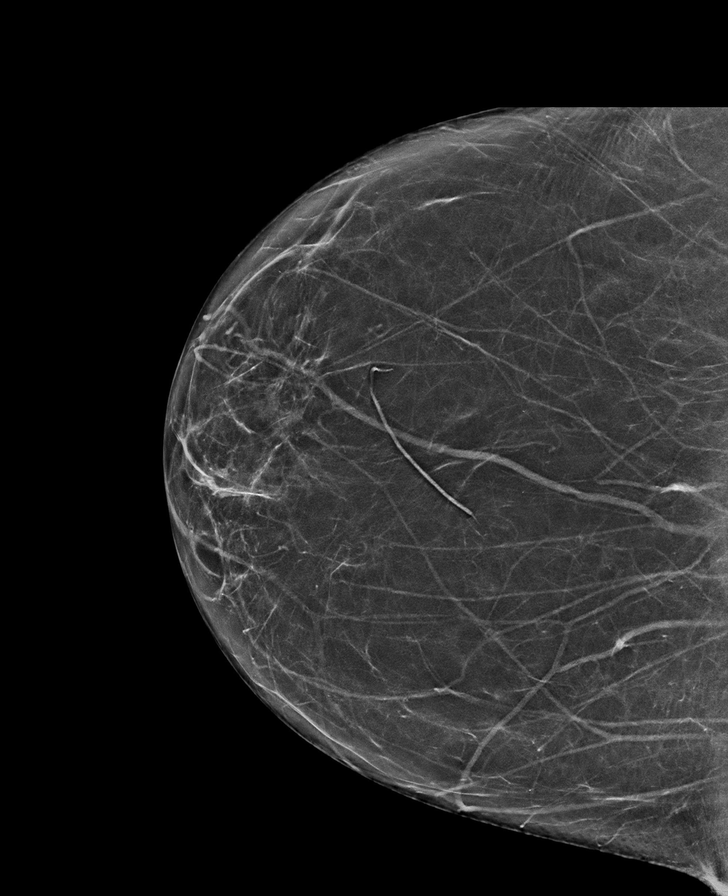

[L CC synth-2D]
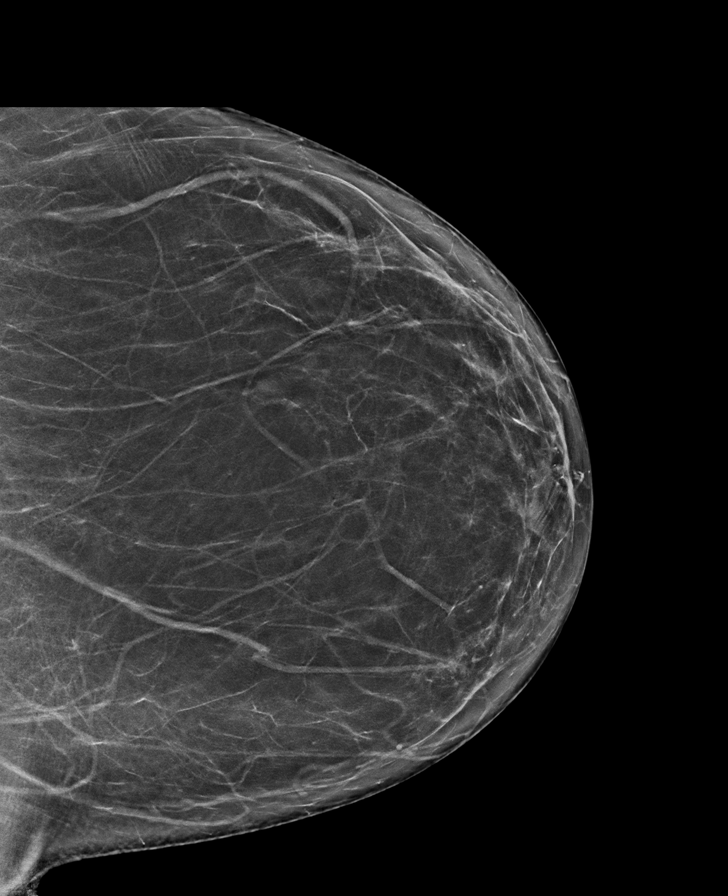

[L CC tomo · tomo slice 37/74.0]
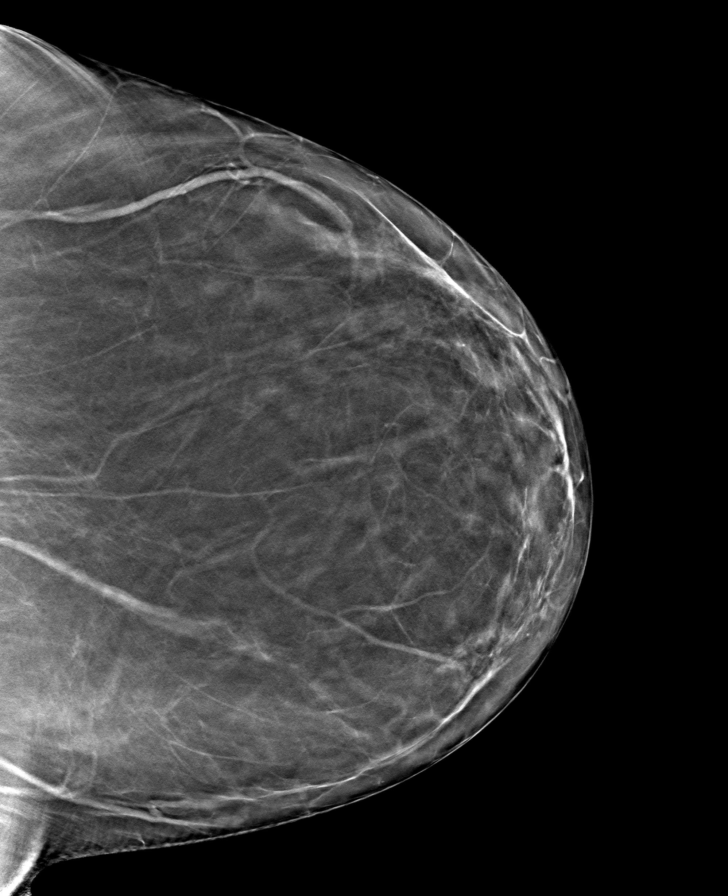

[L MLO tomo · tomo slice 40/79.0]
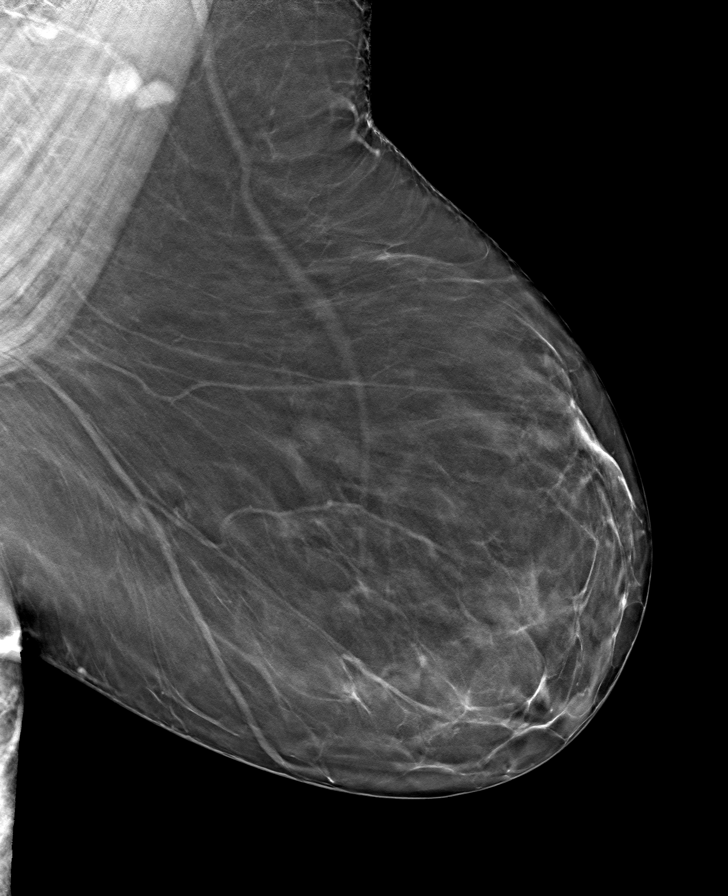

[R CC tomo · tomo slice 35/68.0]
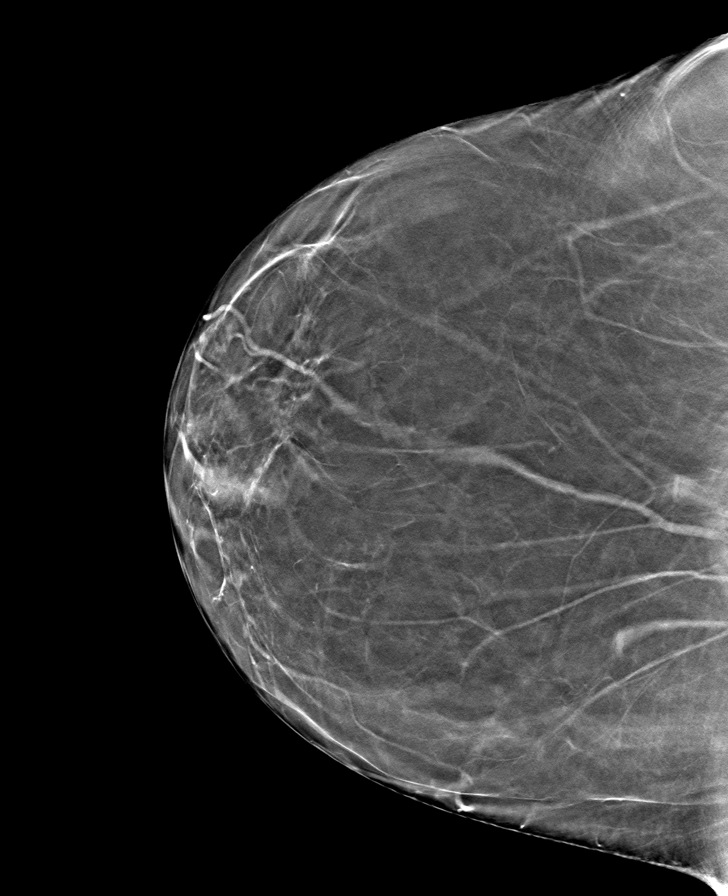

[R MLO tomo · tomo slice 41/80.0]
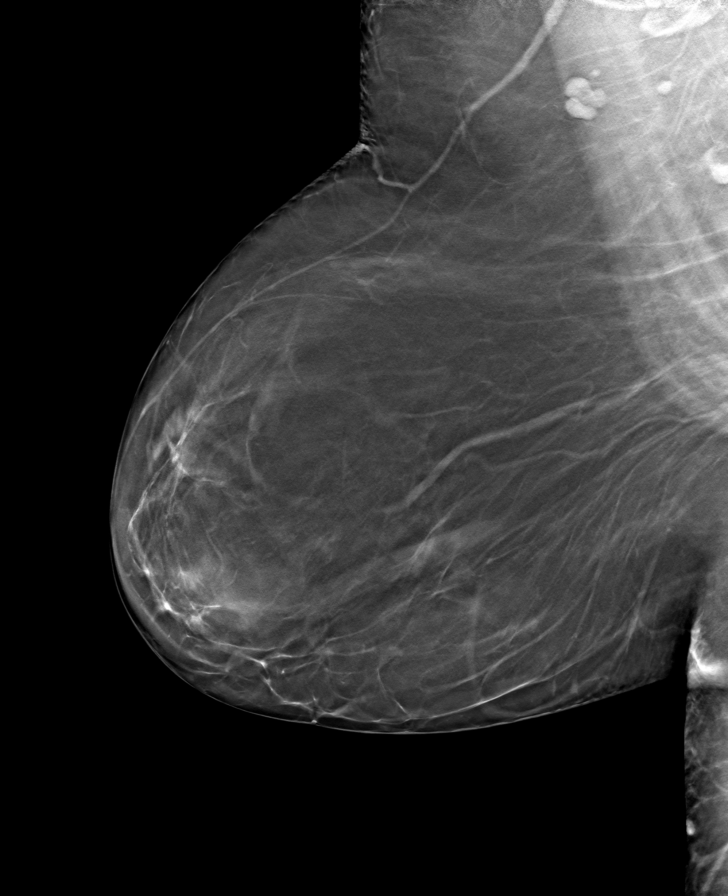

[8 of 24 positions shown; findings below may reference images not displayed]

ACR Breast Density Category b: There are scattered areas of
fibroglandular density.
FINDINGS: There are no findings suspicious for malignancy. Images were
processed with CAD.
IMPRESSION: No mammographic evidence of malignancy. A result letter of this
screening mammogram will be mailed directly to the patient.

RECOMMENDATION:
Screening mammogram in one year. (Code:[id number])

BI-RADS CATEGORY  1: Negative.

## 2018-12-16 ENCOUNTER — Ambulatory Visit (INDEPENDENT_AMBULATORY_CARE_PROVIDER_SITE_OTHER): Payer: Medicare Other | Admitting: General Practice

## 2018-12-16 DIAGNOSIS — Z7901 Long term (current) use of anticoagulants: Secondary | ICD-10-CM

## 2018-12-16 LAB — POCT INR: INR: 2.8 (ref 2.0–3.0)

## 2018-12-16 NOTE — Patient Instructions (Addendum)
Pre visit review using our clinic review tool, if applicable. No additional management support is needed unless otherwise documented below in the visit note.  Continue to take 7.5 mg daily.  Re-check in 6  weeks.  

## 2019-01-12 ENCOUNTER — Other Ambulatory Visit: Payer: Self-pay | Admitting: Family Medicine

## 2019-01-14 ENCOUNTER — Encounter: Payer: Self-pay | Admitting: Family Medicine

## 2019-01-14 ENCOUNTER — Ambulatory Visit (INDEPENDENT_AMBULATORY_CARE_PROVIDER_SITE_OTHER): Payer: Medicare Other | Admitting: Family Medicine

## 2019-01-14 ENCOUNTER — Ambulatory Visit: Payer: Self-pay

## 2019-01-14 ENCOUNTER — Other Ambulatory Visit: Payer: Self-pay

## 2019-01-14 DIAGNOSIS — J440 Chronic obstructive pulmonary disease with acute lower respiratory infection: Secondary | ICD-10-CM | POA: Diagnosis not present

## 2019-01-14 DIAGNOSIS — J209 Acute bronchitis, unspecified: Secondary | ICD-10-CM

## 2019-01-14 MED ORDER — LEVOFLOXACIN 500 MG PO TABS: 500.0000 mg | ORAL_TABLET | Freq: Every day | ORAL | 0 refills | Status: AC

## 2019-01-14 MED ORDER — METHYLPREDNISOLONE 4 MG PO TBPK: ORAL_TABLET | ORAL | 0 refills | Status: DC

## 2019-01-14 MED ORDER — ALBUTEROL SULFATE HFA 108 (90 BASE) MCG/ACT IN AERS: INHALATION_SPRAY | RESPIRATORY_TRACT | 11 refills | Status: DC

## 2019-01-14 MED ORDER — IPRATROPIUM-ALBUTEROL 0.5-2.5 (3) MG/3ML IN SOLN: 3.0000 mL | RESPIRATORY_TRACT | 5 refills | Status: DC | PRN

## 2019-01-14 MED ORDER — BENZONATATE 200 MG PO CAPS: 200.0000 mg | ORAL_CAPSULE | Freq: Two times a day (BID) | ORAL | 1 refills | Status: DC | PRN

## 2019-01-14 NOTE — Telephone Encounter (Signed)
Pt. Called to report increased cough, shortness of breath and fatigue over past 2 weeks.  Reported productive cough of thick green phlegm, intermittent wheezing, and chills. (doesn't have a thermometer)  C/o some chest heaviness with the increased coughing.  Reported using Albuterol Nebulizer prn.  Also, taking Benadryl PRN.  Reported using O2 at 4 liters/ [place].  O2 Sat. 95% on O2.  Reported decrease in O2 Sat. To upper 70's on room air.  Voiced concern of pneumonia.  Denied any travel or poss. exposure to Coronavirus in past 14 days.  Appt sched. At 1:30 PM today for Web Ex.  Care advice given per protocol.  Verb understanding and agreed with plan.  Called FC; advised of appt. Sched. At 1:30; confirmed that this would be a Web Ex.    Called pt. And informed she will receive a link via Email, for the Web Ex at 1:30 PM.  Agreed with plan.   Reason for Disposition . [1] Continuous (nonstop) coughing interferes with work or school AND [2] no improvement using cough treatment per protocol  Answer Assessment - Initial Assessment Questions 1. ONSET: "When did the cough begin?"      Cough changed in past couple weeks  2. SEVERITY: "How bad is the cough today?"      Moderate-severe 3. RESPIRATORY DISTRESS: "Describe your breathing."      Shortness of breath has increased with activity and rest  4. FEVER: "Do you have a fever?" If so, ask: "What is your temperature, how was it measured, and when did it start?"     chills 5. SPUTUM: "Describe the color of your sputum" (e.g., clear, white, yellow, green), "Has there been any change recently?"     Coughing up thick green phlegm  6. HEMOPTYSIS: "Are you coughing up any blood?" If so ask: "How much, flecks, streaks, tablespoons, etc.?"     denied 7. CARDIAC HISTORY: "Do you have any history of heart disease?" (e.g., heart attack, congestive heart failure)      Mitral stenosis 8. LUNG HISTORY: "Do you have any history of lung disease?"  (e.g., pulmonary  embolus, asthma, emphysema/COPD)     COPD 9. OTHER SYMPTOMS: "Do you have any other symptoms? (e.g., runny nose, wheezing, chest pain)     Intermittent wheezing, feels like chest is heavy, SOB; using O2 @ 4L per [place]; O2 sat 95 % on O2 10. PREGNANCY: "Is there any chance you are pregnant?" "When was your last menstrual period?"      N/a  11. TRAVEL: "Have you traveled out of the country in the last month?" (e.g., travel history, exposures)       Denied  Protocols used: [place]

## 2019-01-14 NOTE — Progress Notes (Signed)
   Subjective:    Patient ID: Jaime Kelley, female    DOB: 1951/04/10, 68 y.o.   MRN: [id number]  HPI Virtual Visit via Telephone Note  I connected with the patient  on 01/14/19 at  1:30 PM EDT by telephone and verified that I am speaking with the correct person using two identifiers.   I discussed the limitations, risks, security and privacy concerns of performing an evaluation and management service by telephone and the availability of in person appointments. I also discussed with the patient that there may be a patient responsible charge related to this service. The patient expressed understanding and agreed to proceed.  Location patient: home Location provider: work or home office Participants present for the call: patient, provider Patient did not have a visit in the prior 7 days to address this/these issue(s).   History of Present Illness: For the past 2 weeks she has had chest tightness and is coughing up green sputum. She is very SOB, and her RA sats were dropping to the high 70s. For the past 24 hours she is using her oxygen concentrator at 4 liters and her sats are up to 96%. No chest pain or fever. No GI symptoms. Drinking fluids. Using her Proair and her nebulizer averaging 3 times a day.    Observations/Objective: Patient sounds cheerful and well on the phone. I do not appreciate any SOB. Speech and thought processing are grossly intact. Patient reported vitals:  Assessment and Plan: She has a bronchitis on top of her COPD. We will treat with Levaquin, and also add a Medrol dose pack and Benzonatate. Follow up prn.  Follow Up Instructions:     [id number] 5-10 [id number] 11-20 [id number] 21-30 I did not refer this patient for an OV in the next 24 hours for this/these issue(s).  I discussed the assessment and treatment plan with the patient. The patient was provided an opportunity to ask questions and all were answered. The patient agreed with the plan and demonstrated an  understanding of the instructions.   The patient was advised to call back or seek an in-person evaluation if the symptoms worsen or if the condition fails to improve as anticipated.  I provided 18 minutes of non-face-to-face time during this encounter.   [name], MD    Review of Systems     Objective:   Physical Exam        Assessment & Plan:

## 2019-01-24 ENCOUNTER — Other Ambulatory Visit: Payer: Self-pay

## 2019-01-24 ENCOUNTER — Telehealth: Payer: Self-pay | Admitting: *Deleted

## 2019-01-24 ENCOUNTER — Encounter: Payer: Self-pay | Admitting: Family Medicine

## 2019-01-24 ENCOUNTER — Ambulatory Visit (INDEPENDENT_AMBULATORY_CARE_PROVIDER_SITE_OTHER): Payer: Medicare Other | Admitting: Family Medicine

## 2019-01-24 DIAGNOSIS — M0579 Rheumatoid arthritis with rheumatoid factor of multiple sites without organ or systems involvement: Secondary | ICD-10-CM | POA: Diagnosis not present

## 2019-01-24 DIAGNOSIS — F1190 Opioid use, unspecified, uncomplicated: Secondary | ICD-10-CM | POA: Diagnosis not present

## 2019-01-24 NOTE — Telephone Encounter (Signed)
Copied from [place] [phone]. Topic: General - Other >> Jan 23, 2019  1:25 PM [name], [place] C wrote: Reason for CRM: pt called in inquiring about her Rx for HYDROcodone-acetaminophen ([place]) 5-325 MG tablet. Pt says that she just completed a visit with PCP for med refill.    Please advise.    [phone]   Pt is having a PMV today with Dr. Sarajane Jews and will get these refills today.

## 2019-01-27 ENCOUNTER — Ambulatory Visit: Payer: Medicare Other

## 2019-01-27 ENCOUNTER — Ambulatory Visit (INDEPENDENT_AMBULATORY_CARE_PROVIDER_SITE_OTHER): Payer: Medicare Other | Admitting: General Practice

## 2019-01-27 ENCOUNTER — Encounter: Payer: Self-pay | Admitting: Family Medicine

## 2019-01-27 ENCOUNTER — Other Ambulatory Visit: Payer: Self-pay

## 2019-01-27 DIAGNOSIS — Z7901 Long term (current) use of anticoagulants: Secondary | ICD-10-CM | POA: Diagnosis not present

## 2019-01-27 LAB — POCT INR: INR: 2.7 (ref 2.0–3.0)

## 2019-01-27 MED ORDER — HYDROCODONE-ACETAMINOPHEN 5-325 MG PO TABS: 1.0000 | ORAL_TABLET | Freq: Four times a day (QID) | ORAL | 0 refills | Status: DC | PRN

## 2019-01-27 MED ORDER — HYDROCODONE-ACETAMINOPHEN 5-325 MG PO TABS: 1.0000 | ORAL_TABLET | Freq: Four times a day (QID) | ORAL | 0 refills | Status: AC | PRN

## 2019-01-27 NOTE — Telephone Encounter (Signed)
Pt called and stated that pharmacy does not have RX and would like a call back regarding. Please advise

## 2019-01-27 NOTE — Telephone Encounter (Signed)
These were sent in  

## 2019-01-27 NOTE — Telephone Encounter (Signed)
Dr. Fry please advise. Thanks  

## 2019-01-27 NOTE — Progress Notes (Signed)
Subjective:    Patient ID: Jaime Kelley, female    DOB: [birth year], 68 y.o.   MRN: [id number]  HPI Virtual Visit via Video Note  I connected with the patient on 01/27/19 at  3:30 PM EDT by a video enabled telemedicine application and verified that I am speaking with the correct person using two identifiers.  Location patient: home Location provider:work or home office Persons participating in the virtual visit: patient, provider  I discussed the limitations of evaluation and management by telemedicine and the availability of in person appointments. The patient expressed understanding and agreed to proceed.   HPI: Here for pain management. She is doing well.  Indication for chronic opioid: rheumatoid arthritis  Medication and dose: Norco 5-325 # pills per month: 120 Last UDS date: 10-24-18 Opioid Treatment Agreement signed (Y/N): 10-26-17                                                                                      Opioid Treatment Agreement last reviewed with patient:  01-24-19 [facility] reviewed this encounter (include red flags):  01-24-19    ROS: See pertinent positives and negatives per HPI.  Past Medical History:  Diagnosis Date  . COPD (chronic obstructive pulmonary disease) ([place])   . Discoid lupus    sees Dr. Allyson Sabal  . GERD (gastroesophageal reflux disease)   . History of colonic polyps   . Hx pulmonary embolism   . Hypothyroidism   . Rheumatoid arthritis(714.0)    sees Dr. Dossie Der   . Thyroid disease     Past Surgical History:  Procedure Laterality Date  . ABDOMINAL HYSTERECTOMY    . arthroscopy right knee    . BREAST EXCISIONAL BIOPSY Right 2011  . carpal tunnel release left wrist    . 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 TONSILLECTOMY      Family History  Problem Relation Age of Onset  . Arthritis Other   . Coronary artery disease Other   . Hypertension Other   . Stroke Other   . Cancer Other        lung  . Lymphoma Son      Current  Outpatient Medications:  .  albuterol (PROAIR HFA) 108 (90 Base) MCG/ACT inhaler, INHALE 2 PUFFS INTO THE LUNGS EVERY 4 (FOUR) HOURS AS NEEDED FOR WHEEZING., Disp: 8.5 Inhaler, Rfl: 11 .  amLODipine (NORVASC) 10 MG tablet, Take 1 tablet (10 mg total) by mouth daily., Disp: 90 tablet, Rfl: 3 .  benzonatate (TESSALON) 200 MG capsule, Take 1 capsule (200 mg total) by mouth 2 (two) times daily as needed for cough., Disp: 60 capsule, Rfl: 1 .  DiphenhydrAMINE HCl (BENADRYL ALLERGY PO), Take by mouth., Disp: , Rfl:  .  folic acid (FOLVITE) 1 MG tablet, Take 1 mg by mouth daily.  , Disp: , Rfl:  .  halobetasol (ULTRAVATE) 0.05 % cream, Apply topically 2 (two) times daily., Disp: 50 g, Rfl: 2 .  ipratropium-albuterol (DUONEB) 0.5-2.5 (3) MG/3ML SOLN, Take 3 mLs by nebulization every 4 (four) hours as needed., Disp: 360 mL, Rfl: 5 .  losartan (COZAAR) 100 MG tablet, Take 1 tablet (  100 mg total) by mouth daily., Disp: 90 tablet, Rfl: 3 .  metFORMIN (GLUCOPHAGE) 500 MG tablet, TAKE 1 TABLET BY MOUTH TWICE A DAY, Disp: 180 tablet, Rfl: 0 .  methotrexate (RHEUMATREX) 2.5 MG tablet, Take 15 mg by mouth once a week. Take 6 tablets. Caution:Chemotherapy. Protect from light., Disp: , Rfl:  .  methylPREDNISolone (MEDROL DOSEPAK) 4 MG TBPK tablet, As directed, Disp: 21 tablet, Rfl: 0 .  sertraline (ZOLOFT) 100 MG tablet, TAKE 1/2 TABLET BY MOUTH EVERY EVENING, Disp: , Rfl:  .  SYNTHROID 137 MCG tablet, TAKE 1 TABLET (137 MCG TOTAL) BY MOUTH DAILY BEFORE BREAKFAST., Disp: 90 tablet, Rfl: 1 .  warfarin (COUMADIN) 7.5 MG tablet, TAKE 1 TABLET DAILY OR AS DIRECTED BY ANTICOAGULATION CLINIC, Disp: 90 tablet, Rfl: 1  EXAM:  VITALS per patient if applicable:  GENERAL: alert, oriented, appears well and in no acute distress  HEENT: atraumatic, conjunttiva clear, no obvious abnormalities on inspection of external nose and ears  NECK: normal movements of the head and neck  LUNGS: on inspection no signs of respiratory  distress, breathing rate appears normal, no obvious gross SOB, gasping or wheezing  CV: no obvious cyanosis  MS: moves all visible extremities without noticeable abnormality  PSYCH/NEURO: pleasant and cooperative, no obvious depression or anxiety, speech and thought processing grossly intact  ASSESSMENT AND PLAN: Pain management, Norco was refilled. I also suggetsed she take a 500 mg Tylenol bid as needed.  [name], MD  Discussed the following assessment and plan:  No diagnosis found.     I discussed the assessment and treatment plan with the patient. The patient was provided an opportunity to ask questions and all were answered. The patient agreed with the plan and demonstrated an understanding of the instructions.   The patient was advised to call back or seek an in-person evaluation if the symptoms worsen or if the condition fails to improve as anticipated.      Review of Systems     Objective:   Physical Exam        Assessment & Plan:

## 2019-01-27 NOTE — Patient Instructions (Incomplete)
Pre visit review using our clinic review tool, if applicable. No additional management support is needed unless otherwise documented below in the visit note.  Continue to take 7.5 mg daily.  Re-check in 6  weeks.

## 2019-02-12 ENCOUNTER — Other Ambulatory Visit: Payer: Self-pay | Admitting: Family Medicine

## 2019-02-17 ENCOUNTER — Other Ambulatory Visit: Payer: Self-pay | Admitting: Family Medicine

## 2019-02-17 ENCOUNTER — Encounter: Payer: Self-pay | Admitting: Family Medicine

## 2019-02-17 ENCOUNTER — Ambulatory Visit: Payer: Medicare Other | Admitting: Podiatry

## 2019-02-24 ENCOUNTER — Encounter: Payer: Self-pay | Admitting: Family Medicine

## 2019-02-25 ENCOUNTER — Encounter: Payer: Self-pay | Admitting: Family Medicine

## 2019-02-25 ENCOUNTER — Ambulatory Visit (INDEPENDENT_AMBULATORY_CARE_PROVIDER_SITE_OTHER): Payer: Medicare Other | Admitting: Family Medicine

## 2019-02-25 ENCOUNTER — Other Ambulatory Visit: Payer: Self-pay

## 2019-02-25 DIAGNOSIS — G8929 Other chronic pain: Secondary | ICD-10-CM | POA: Diagnosis not present

## 2019-02-25 DIAGNOSIS — M25561 Pain in right knee: Secondary | ICD-10-CM

## 2019-02-25 DIAGNOSIS — M25562 Pain in left knee: Secondary | ICD-10-CM

## 2019-02-25 MED ORDER — DICLOFENAC SODIUM 1 % TD GEL: 2.0000 g | Freq: Four times a day (QID) | TRANSDERMAL | 5 refills | Status: AC

## 2019-02-25 NOTE — Progress Notes (Signed)
Subjective:    Patient ID: Jaime Kelley, female    DOB: 1950-11-13, 68 y.o.   MRN: [id number]  HPI Virtual Visit via Video Note  I connected with the patient on 02/25/19 at  1:30 PM EDT by a video enabled telemedicine application and verified that I am speaking with the correct person using two identifiers.  Location patient: home Location provider:work or home office Persons participating in the virtual visit: patient, provider  I discussed the limitations of evaluation and management by telemedicine and the availability of in person appointments. The patient expressed understanding and agreed to proceed.   HPI: Here for worsening pain in both knees. She has rheumatoid arthritis and also some osteoarthritis on top of that. She has had cortisone shots and arthroscopic surgeries on her knees in years past. However in the past few weeks both knees have become swollen and they are very painful. They are not red or warm. She has a lot of pain with going up or down steps. She is taking Norco on a regular basis. She has been using ice packs and applying Ephraim Hamburger to the knees with no relief.    ROS: See pertinent positives and negatives per HPI.  Past Medical History:  Diagnosis Date  . COPD (chronic obstructive pulmonary disease) ([place])   . Discoid lupus    sees Dr. Allyson Sabal  . GERD (gastroesophageal reflux disease)   . History of colonic polyps   . Hx pulmonary embolism   . Hypothyroidism   . Rheumatoid arthritis(714.0)    sees Dr. Dossie Der   . Thyroid disease     Past Surgical History:  Procedure Laterality Date  . ABDOMINAL HYSTERECTOMY    . arthroscopy right knee    . BREAST EXCISIONAL BIOPSY Right 2011  . carpal tunnel release left wrist    . 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 TONSILLECTOMY      Family History  Problem Relation Age of Onset  . Arthritis Other   . Coronary artery disease Other   . Hypertension Other   . Stroke Other   . Cancer Other     lung  . Lymphoma Son      Current Outpatient Medications:  .  albuterol (PROAIR HFA) 108 (90 Base) MCG/ACT inhaler, INHALE 2 PUFFS INTO THE LUNGS EVERY 4 (FOUR) HOURS AS NEEDED FOR WHEEZING., Disp: 8.5 Inhaler, Rfl: 11 .  amLODipine (NORVASC) 10 MG tablet, Take 1 tablet (10 mg total) by mouth daily., Disp: 90 tablet, Rfl: 3 .  benzonatate (TESSALON) 200 MG capsule, Take 1 capsule (200 mg total) by mouth 2 (two) times daily as needed for cough., Disp: 60 capsule, Rfl: 1 .  DiphenhydrAMINE HCl (BENADRYL ALLERGY PO), Take by mouth., Disp: , Rfl:  .  folic acid (FOLVITE) 1 MG tablet, Take 1 mg by mouth daily.  , Disp: , Rfl:  .  halobetasol (ULTRAVATE) 0.05 % cream, Apply topically 2 (two) times daily., Disp: 50 g, Rfl: 2 .  [START ON 03/29/2019] HYDROcodone-acetaminophen (NORCO) 5-325 MG tablet, Take 1 tablet by mouth every 6 (six) hours as needed for up to 30 days for moderate pain., Disp: 120 tablet, Rfl: 0 .  ipratropium-albuterol (DUONEB) 0.5-2.5 (3) MG/3ML SOLN, Take 3 mLs by nebulization every 4 (four) hours as needed., Disp: 360 mL, Rfl: 5 .  losartan (COZAAR) 100 MG tablet, Take 1 tablet (100 mg total) by mouth daily., Disp: 90 tablet, Rfl: 3 .  metFORMIN (GLUCOPHAGE) 500 MG tablet,  TAKE 1 TABLET BY MOUTH TWICE A DAY, Disp: 180 tablet, Rfl: 1 .  methotrexate (RHEUMATREX) 2.5 MG tablet, Take 15 mg by mouth once a week. Take 6 tablets. Caution:Chemotherapy. Protect from light., Disp: , Rfl:  .  methylPREDNISolone (MEDROL DOSEPAK) 4 MG TBPK tablet, As directed, Disp: 21 tablet, Rfl: 0 .  sertraline (ZOLOFT) 100 MG tablet, TAKE 1/2 TABLET BY MOUTH EVERY EVENING, Disp: , Rfl:  .  SYNTHROID 137 MCG tablet, TAKE 1 TABLET (137 MCG TOTAL) BY MOUTH DAILY BEFORE BREAKFAST., Disp: 90 tablet, Rfl: 1 .  warfarin (COUMADIN) 7.5 MG tablet, TAKE 1 TABLET DAILY OR AS DIRECTED BY ANTICOAGULATION CLINIC, Disp: 90 tablet, Rfl: 1 .  diclofenac sodium (VOLTAREN) 1 % GEL, Apply 2 g topically 4 (four) times daily  for 30 days., Disp: 100 g, Rfl: 5  EXAM:  VITALS per patient if applicable:  GENERAL: alert, oriented, appears well and in no acute distress  HEENT: atraumatic, conjunttiva clear, no obvious abnormalities on inspection of external nose and ears  NECK: normal movements of the head and neck  LUNGS: on inspection no signs of respiratory distress, breathing rate appears normal, no obvious gross SOB, gasping or wheezing  CV: no obvious cyanosis  MS: moves all visible extremities without noticeable abnormality  PSYCH/NEURO: pleasant and cooperative, no obvious depression or anxiety, speech and thought processing grossly intact  ASSESSMENT AND PLAN: Bilateral knee pain. I will let her try Diclofenac 1% gel to apply QID as needed. I will also refer her to Orthopedics to evaluate further.  [name], MD  Discussed the following assessment and plan:  Chronic pain of both knees - Plan: Ambulatory referral to Orthopedic Surgery     I discussed the assessment and treatment plan with the patient. The patient was provided an opportunity to ask questions and all were answered. The patient agreed with the plan and demonstrated an understanding of the instructions.   The patient was advised to call back or seek an in-person evaluation if the symptoms worsen or if the condition fails to improve as anticipated.     Review of Systems     Objective:   Physical Exam        Assessment & Plan:

## 2019-03-10 ENCOUNTER — Ambulatory Visit: Payer: Medicare Other

## 2019-03-24 ENCOUNTER — Other Ambulatory Visit: Payer: Self-pay

## 2019-03-24 ENCOUNTER — Ambulatory Visit (INDEPENDENT_AMBULATORY_CARE_PROVIDER_SITE_OTHER): Payer: Medicare Other | Admitting: General Practice

## 2019-03-24 DIAGNOSIS — Z7901 Long term (current) use of anticoagulants: Secondary | ICD-10-CM | POA: Diagnosis not present

## 2019-03-24 LAB — POCT INR: INR: 4.2 — AB (ref 2.0–3.0)

## 2019-03-24 NOTE — Patient Instructions (Incomplete)
Pre visit review using our clinic review tool, if applicable. No additional management support is needed unless otherwise documented below in the visit note.  Hold coumadin today and tomorrow and then continue to take 7.5 mg daily.  Re-check in 4  weeks.

## 2019-04-04 ENCOUNTER — Other Ambulatory Visit: Payer: Self-pay | Admitting: Family Medicine

## 2019-04-10 NOTE — Telephone Encounter (Signed)
Dr. Fry please advise on refill. Thanks  

## 2019-04-18 ENCOUNTER — Encounter: Payer: Self-pay | Admitting: Family Medicine

## 2019-04-21 ENCOUNTER — Ambulatory Visit: Payer: Medicare Other

## 2019-04-24 ENCOUNTER — Encounter: Payer: Self-pay | Admitting: Family Medicine

## 2019-04-24 MED ORDER — NITROFURANTOIN MONOHYD MACRO 100 MG PO CAPS: 100.0000 mg | ORAL_CAPSULE | Freq: Two times a day (BID) | ORAL | 0 refills | Status: DC

## 2019-04-24 NOTE — Telephone Encounter (Signed)
Dr. Fry please advise. Thanks  

## 2019-04-24 NOTE — Telephone Encounter (Signed)
Call in Macrobid 100 mg bid for 7 days  

## 2019-04-28 ENCOUNTER — Encounter: Payer: Self-pay | Admitting: Family Medicine

## 2019-04-29 ENCOUNTER — Other Ambulatory Visit: Payer: Self-pay | Admitting: Family Medicine

## 2019-04-29 NOTE — Telephone Encounter (Signed)
Dr. Fry please advise. Thanks  

## 2019-04-29 NOTE — Telephone Encounter (Signed)
Stop the Macrobid, and call in Cipro 500 mg bid for 7 days. Drink lots of water

## 2019-04-30 MED ORDER — CIPROFLOXACIN HCL 500 MG PO TABS: 500.0000 mg | ORAL_TABLET | Freq: Two times a day (BID) | ORAL | 0 refills | Status: DC

## 2019-05-02 ENCOUNTER — Encounter: Payer: Self-pay | Admitting: Family Medicine

## 2019-05-05 MED ORDER — AMLODIPINE BESYLATE 10 MG PO TABS: 10.0000 mg | ORAL_TABLET | Freq: Every day | ORAL | 1 refills | Status: DC

## 2019-05-09 ENCOUNTER — Encounter: Payer: Self-pay | Admitting: Family Medicine

## 2019-05-12 ENCOUNTER — Other Ambulatory Visit: Payer: Self-pay | Admitting: Family Medicine

## 2019-05-14 ENCOUNTER — Ambulatory Visit (INDEPENDENT_AMBULATORY_CARE_PROVIDER_SITE_OTHER): Payer: Medicare Other | Admitting: Family Medicine

## 2019-05-14 ENCOUNTER — Encounter: Payer: Self-pay | Admitting: Family Medicine

## 2019-05-14 ENCOUNTER — Other Ambulatory Visit: Payer: Self-pay

## 2019-05-14 DIAGNOSIS — M0579 Rheumatoid arthritis with rheumatoid factor of multiple sites without organ or systems involvement: Secondary | ICD-10-CM | POA: Diagnosis not present

## 2019-05-14 DIAGNOSIS — F1190 Opioid use, unspecified, uncomplicated: Secondary | ICD-10-CM | POA: Diagnosis not present

## 2019-05-14 MED ORDER — HYDROCODONE-ACETAMINOPHEN 5-325 MG PO TABS: 1.0000 | ORAL_TABLET | Freq: Four times a day (QID) | ORAL | 0 refills | Status: DC | PRN

## 2019-05-14 MED ORDER — HYDROCODONE-ACETAMINOPHEN 5-325 MG PO TABS: 1.0000 | ORAL_TABLET | Freq: Four times a day (QID) | ORAL | 0 refills | Status: AC | PRN

## 2019-05-14 NOTE — Progress Notes (Signed)
Subjective:    Patient ID: Jaime Kelley, female    DOB: September 02, 1951, 68 y.o.   MRN: [id number]  HPI Virtual Visit via Video Note  I connected with the patient on 05/14/19 at  1:00 PM EDT by a video enabled telemedicine application and verified that I am speaking with the correct person using two identifiers.  Location patient: home Location provider:work or home office Persons participating in the virtual visit: patient, provider  I discussed the limitations of evaluation and management by telemedicine and the availability of in person appointments. The patient expressed understanding and agreed to proceed.   HPI: Here for pain management. She has been struggling with pain, especially in the knees. She wants to keep her pain meds the same however. She will see her rheumatologist tomorrow.  Indication for chronic opioid: RA Medication and dose: Norco 5-325 # pills per month: 120 Last UDS date: 10-24-18 Opioid Treatment Agreement signed (Y/N): 10-26-17 Opioid Treatment Agreement last reviewed with patient:  05-14-19 NCCSRS reviewed this encounter (include red flags): Yes    ROS: See pertinent positives and negatives per HPI.  Past Medical History:  Diagnosis Date  . COPD (chronic obstructive pulmonary disease) ([place])   . Discoid lupus    sees Dr. Allyson Sabal  . GERD (gastroesophageal reflux disease)   . History of colonic polyps   . Hx pulmonary embolism   . Hypothyroidism   . Rheumatoid arthritis(714.0)    sees Dr. Dossie Der   . Thyroid disease     Past Surgical History:  Procedure Laterality Date  . ABDOMINAL HYSTERECTOMY    . arthroscopy right knee    . BREAST EXCISIONAL BIOPSY Right 2011  . carpal tunnel release left wrist    . 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 TONSILLECTOMY      Family History  Problem Relation Age of Onset  . Arthritis Other   . Coronary artery disease Other   . Hypertension Other   . Stroke Other   . Cancer Other        lung  .  Lymphoma Son      Current Outpatient Medications:  .  albuterol (PROAIR HFA) 108 (90 Base) MCG/ACT inhaler, INHALE 2 PUFFS INTO THE LUNGS EVERY 4 (FOUR) HOURS AS NEEDED FOR WHEEZING., Disp: 8.5 Inhaler, Rfl: 11 .  amLODipine (NORVASC) 10 MG tablet, Take 1 tablet (10 mg total) by mouth daily., Disp: 90 tablet, Rfl: 1 .  benzonatate (TESSALON) 200 MG capsule, Take 1 capsule (200 mg total) by mouth 2 (two) times daily as needed for cough., Disp: 60 capsule, Rfl: 1 .  ciprofloxacin (CIPRO) 500 MG tablet, Take 1 tablet (500 mg total) by mouth 2 (two) times daily., Disp: 14 tablet, Rfl: 0 .  DiphenhydrAMINE HCl (BENADRYL ALLERGY PO), Take by mouth., Disp: , Rfl:  .  folic acid (FOLVITE) 1 MG tablet, Take 1 mg by mouth daily.  , Disp: , Rfl:  .  halobetasol (ULTRAVATE) 0.05 % cream, Apply topically 2 (two) times daily., Disp: 50 g, Rfl: 2 .  ipratropium-albuterol (DUONEB) 0.5-2.5 (3) MG/3ML SOLN, Take 3 mLs by nebulization every 4 (four) hours as needed., Disp: 360 mL, Rfl: 5 .  losartan (COZAAR) 100 MG tablet, Take 1 tablet (100 mg total) by mouth daily., Disp: 90 tablet, Rfl: 3 .  metFORMIN (GLUCOPHAGE) 500 MG tablet, TAKE 1 TABLET BY MOUTH TWICE A DAY, Disp: 180 tablet, Rfl: 1 .  methotrexate (RHEUMATREX) 2.5 MG tablet, Take 15 mg by  mouth once a week. Take 6 tablets. Caution:Chemotherapy. Protect from light., Disp: , Rfl:  .  methylPREDNISolone (MEDROL DOSEPAK) 4 MG TBPK tablet, As directed, Disp: 21 tablet, Rfl: 0 .  nitrofurantoin, macrocrystal-monohydrate, (MACROBID) 100 MG capsule, Take 1 capsule (100 mg total) by mouth 2 (two) times daily., Disp: 14 capsule, Rfl: 0 .  sertraline (ZOLOFT) 100 MG tablet, TAKE 1 TABLET BY MOUTH EVERY DAY, Disp: 90 tablet, Rfl: 3 .  SYNTHROID 137 MCG tablet, TAKE 1 TABLET BY MOUTH EVERY DAY BEFORE BREAKFAST, Disp: 90 tablet, Rfl: 1 .  warfarin (COUMADIN) 7.5 MG tablet, TAKE 1 TABLET DAILY OR AS DIRECTED BY ANTICOAGULATION CLINIC, Disp: 90 tablet, Rfl: 1 .  [START  ON 07/15/2019] HYDROcodone-acetaminophen (NORCO) 5-325 MG tablet, Take 1 tablet by mouth every 6 (six) hours as needed for moderate pain., Disp: 120 tablet, Rfl: 0  EXAM:  VITALS per patient if applicable:  GENERAL: alert, oriented, appears well and in no acute distress  HEENT: atraumatic, conjunttiva clear, no obvious abnormalities on inspection of external nose and ears  NECK: normal movements of the head and neck  LUNGS: on inspection no signs of respiratory distress, breathing rate appears normal, no obvious gross SOB, gasping or wheezing  CV: no obvious cyanosis  MS: moves all visible extremities without noticeable abnormality  PSYCH/NEURO: pleasant and cooperative, no obvious depression or anxiety, speech and thought processing grossly intact  ASSESSMENT AND PLAN: Pain management, meds were refilled. She will see Rheumatology tomorrow.  [name], MD  Discussed the following assessment and plan:  No diagnosis found.     I discussed the assessment and treatment plan with the patient. The patient was provided an opportunity to ask questions and all were answered. The patient agreed with the plan and demonstrated an understanding of the instructions.   The patient was advised to call back or seek an in-person evaluation if the symptoms worsen or if the condition fails to improve as anticipated.     Review of Systems     Objective:   Physical Exam        Assessment & Plan:

## 2019-05-15 DIAGNOSIS — Z79899 Other long term (current) drug therapy: Secondary | ICD-10-CM | POA: Diagnosis not present

## 2019-05-15 DIAGNOSIS — M150 Primary generalized (osteo)arthritis: Secondary | ICD-10-CM | POA: Diagnosis not present

## 2019-05-15 DIAGNOSIS — M1710 Unilateral primary osteoarthritis, unspecified knee: Secondary | ICD-10-CM | POA: Diagnosis not present

## 2019-05-15 DIAGNOSIS — M25561 Pain in right knee: Secondary | ICD-10-CM | POA: Diagnosis not present

## 2019-05-15 DIAGNOSIS — L930 Discoid lupus erythematosus: Secondary | ICD-10-CM | POA: Diagnosis not present

## 2019-05-15 DIAGNOSIS — M810 Age-related osteoporosis without current pathological fracture: Secondary | ICD-10-CM | POA: Diagnosis not present

## 2019-05-15 DIAGNOSIS — M5136 Other intervertebral disc degeneration, lumbar region: Secondary | ICD-10-CM | POA: Diagnosis not present

## 2019-05-15 DIAGNOSIS — M0589 Other rheumatoid arthritis with rheumatoid factor of multiple sites: Secondary | ICD-10-CM | POA: Diagnosis not present

## 2019-05-15 DIAGNOSIS — L409 Psoriasis, unspecified: Secondary | ICD-10-CM | POA: Diagnosis not present

## 2019-05-23 ENCOUNTER — Other Ambulatory Visit: Payer: Self-pay

## 2019-06-17 ENCOUNTER — Other Ambulatory Visit: Payer: Self-pay | Admitting: Family Medicine

## 2019-07-10 ENCOUNTER — Telehealth: Payer: Self-pay | Admitting: General Practice

## 2019-07-10 ENCOUNTER — Other Ambulatory Visit: Payer: Self-pay | Admitting: Family Medicine

## 2019-07-10 NOTE — Telephone Encounter (Signed)
Okay for refill?  

## 2019-07-10 NOTE — Telephone Encounter (Signed)
LMOVM for patient to call Villa Herb, RN @ [phone].

## 2019-07-31 ENCOUNTER — Other Ambulatory Visit: Payer: Self-pay | Admitting: Family Medicine

## 2019-08-22 ENCOUNTER — Other Ambulatory Visit: Payer: Self-pay | Admitting: Family Medicine

## 2019-09-05 ENCOUNTER — Encounter: Payer: Self-pay | Admitting: Family Medicine

## 2019-09-08 ENCOUNTER — Encounter: Payer: Self-pay | Admitting: Family Medicine

## 2019-09-09 ENCOUNTER — Encounter: Payer: Self-pay | Admitting: Family Medicine

## 2019-09-09 ENCOUNTER — Other Ambulatory Visit: Payer: Self-pay

## 2019-09-09 ENCOUNTER — Telehealth (INDEPENDENT_AMBULATORY_CARE_PROVIDER_SITE_OTHER): Payer: Medicare Other | Admitting: Family Medicine

## 2019-09-09 DIAGNOSIS — M0579 Rheumatoid arthritis with rheumatoid factor of multiple sites without organ or systems involvement: Secondary | ICD-10-CM | POA: Diagnosis not present

## 2019-09-09 DIAGNOSIS — F1190 Opioid use, unspecified, uncomplicated: Secondary | ICD-10-CM

## 2019-09-09 MED ORDER — HYDROCODONE-ACETAMINOPHEN 10-325 MG PO TABS: 1.0000 | ORAL_TABLET | Freq: Four times a day (QID) | ORAL | 0 refills | Status: AC | PRN

## 2019-09-09 MED ORDER — HYDROCODONE-ACETAMINOPHEN 10-325 MG PO TABS: 1.0000 | ORAL_TABLET | Freq: Four times a day (QID) | ORAL | 0 refills | Status: DC | PRN

## 2019-09-09 NOTE — Progress Notes (Signed)
Virtual Visit via Video Note  I connected with the patient on 09/09/19 at  8:00 AM EST by a video enabled telemedicine application and verified that I am speaking with the correct person using two identifiers.  Location patient: home Location provider:work or home office Persons participating in the virtual visit: patient, provider  I discussed the limitations of evaluation and management by telemedicine and the availability of in person appointments. The patient expressed understanding and agreed to proceed.   HPI: Here for pain management. Her pain levels have increased quite a bit in the past month, and now her legs are so painful she cannot walk on them (even with using a walker). She had a virtual visit with her rheumatologist several months ago. She is not happy with the Methotrexate because it causes her to have diarrhea.  Indication for chronic opioid: rheumatoid arthritis  Medication and dose: Norco 5-325 # pills per month: 120 Last UDS date: 10-24-18 Opioid Treatment Agreement signed (Y/N): 10-26-17 Opioid Treatment Agreement last reviewed with patient:  09-09-19 NCCSRS reviewed this encounter (include red flags): Yes    ROS: See pertinent positives and negatives per HPI.  Past Medical History:  Diagnosis Date  . COPD (chronic obstructive pulmonary disease) ([place])   . Discoid lupus    sees Dr. Allyson Sabal  . GERD (gastroesophageal reflux disease)   . History of colonic polyps   . Hx pulmonary embolism   . Hypothyroidism   . Rheumatoid arthritis(714.0)    sees Dr. Dossie Der   . Thyroid disease     Past Surgical History:  Procedure Laterality Date  . ABDOMINAL HYSTERECTOMY    . arthroscopy right knee    . BREAST EXCISIONAL BIOPSY Right 2011  . carpal tunnel release left wrist    . 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 TONSILLECTOMY      Family History  Problem Relation Age of Onset  . Arthritis Other   . Coronary artery disease Other   . Hypertension Other   .  Stroke Other   . Cancer Other        lung  . Lymphoma Son      Current Outpatient Medications:  .  albuterol (PROAIR HFA) 108 (90 Base) MCG/ACT inhaler, INHALE 2 PUFFS INTO THE LUNGS EVERY 4 (FOUR) HOURS AS NEEDED FOR WHEEZING., Disp: 8.5 Inhaler, Rfl: 11 .  amLODipine (NORVASC) 10 MG tablet, Take 1 tablet (10 mg total) by mouth daily., Disp: 90 tablet, Rfl: 1 .  benzonatate (TESSALON) 200 MG capsule, Take 1 capsule (200 mg total) by mouth 2 (two) times daily as needed for cough., Disp: 60 capsule, Rfl: 1 .  ciprofloxacin (CIPRO) 500 MG tablet, Take 1 tablet (500 mg total) by mouth 2 (two) times daily., Disp: 14 tablet, Rfl: 0 .  DiphenhydrAMINE HCl (BENADRYL ALLERGY PO), Take by mouth., Disp: , Rfl:  .  folic acid (FOLVITE) 1 MG tablet, Take 1 mg by mouth daily.  , Disp: , Rfl:  .  halobetasol (ULTRAVATE) 0.05 % cream, Apply topically 2 (two) times daily., Disp: 50 g, Rfl: 2 .  [START ON 11/09/2019] HYDROcodone-acetaminophen (NORCO) 10-325 MG tablet, Take 1 tablet by mouth every 6 (six) hours as needed for severe pain., Disp: 120 tablet, Rfl: 0 .  ipratropium-albuterol (DUONEB) 0.5-2.5 (3) MG/3ML SOLN, TAKE 3 MLS BY NEBULIZATION EVERY 4 (FOUR) HOURS AS NEEDED., Disp: 360 mL, Rfl: 5 .  losartan (COZAAR) 100 MG tablet, TAKE 1 TABLET BY MOUTH EVERY DAY, Disp: 90 tablet,  Rfl: 3 .  metFORMIN (GLUCOPHAGE) 500 MG tablet, TAKE 1 TABLET BY MOUTH TWICE A DAY, Disp: 180 tablet, Rfl: 1 .  methotrexate (RHEUMATREX) 2.5 MG tablet, Take 15 mg by mouth once a week. Take 6 tablets. Caution:Chemotherapy. Protect from light., Disp: , Rfl:  .  methylPREDNISolone (MEDROL DOSEPAK) 4 MG TBPK tablet, As directed, Disp: 21 tablet, Rfl: 0 .  nitrofurantoin, macrocrystal-monohydrate, (MACROBID) 100 MG capsule, Take 1 capsule (100 mg total) by mouth 2 (two) times daily., Disp: 14 capsule, Rfl: 0 .  sertraline (ZOLOFT) 100 MG tablet, TAKE 1 TABLET BY MOUTH EVERY DAY, Disp: 90 tablet, Rfl: 3 .  SYNTHROID 137 MCG tablet,  TAKE 1 TABLET BY MOUTH EVERY DAY BEFORE BREAKFAST, Disp: 90 tablet, Rfl: 1 .  warfarin (COUMADIN) 7.5 MG tablet, TAKE 1 TABLET DAILY OR AS DIRECTED BY ANTICOAGULATION CLINIC, Disp: 90 tablet, Rfl: 1  EXAM:  VITALS per patient if applicable:  GENERAL: alert, oriented, appears well and in no acute distress  HEENT: atraumatic, conjunttiva clear, no obvious abnormalities on inspection of external nose and ears  NECK: normal movements of the head and neck  LUNGS: on inspection no signs of respiratory distress, breathing rate appears normal, no obvious gross SOB, gasping or wheezing  CV: no obvious cyanosis  MS: moves all visible extremities without noticeable abnormality  PSYCH/NEURO: pleasant and cooperative, no obvious depression or anxiety, speech and thought processing grossly intact  ASSESSMENT AND PLAN: Pain management. Her pain has increased so we will increase the dose of Norco to 10-325 every 6 hours as needed. I also asked her to follow up with her rheumatologist so they can come up with a different management plan.  [name], MD  Discussed the following assessment and plan:  Rheumatoid arthritis involving multiple sites with positive rheumatoid factor ([place])     I discussed the assessment and treatment plan with the patient. The patient was provided an opportunity to ask questions and all were answered. The patient agreed with the plan and demonstrated an understanding of the instructions.   The patient was advised to call back or seek an in-person evaluation if the symptoms worsen or if the condition fails to improve as anticipated.

## 2019-09-26 ENCOUNTER — Telehealth: Payer: Self-pay | Admitting: Family Medicine

## 2019-09-26 NOTE — Telephone Encounter (Signed)
Pt would like to use [facility] [phone] for her in home assistance.  And she stated that someone had called her over a week ago to get this done and no one has called her to let her know anything.  Pt has been using them for her O2.

## 2019-09-26 NOTE — Telephone Encounter (Signed)
Called pt 2x to see if she was needing HH for in home aide. Pt was called a week ago regarding medical supplies and advise me to send to any medical supply store. Supplies were sent to [facility].

## 2019-10-21 ENCOUNTER — Telehealth: Payer: Self-pay | Admitting: *Deleted

## 2019-10-21 NOTE — Telephone Encounter (Signed)
Copied from [place] [id number]. Topic: General - Other >> Oct 21, 2019  1:36 PM Jaime Kelley, [place] C wrote: Reason for CRM: pt called in to check the status of her nebulizer order, pt would like a call back to discuss further.   CB: [phone]

## 2019-10-22 ENCOUNTER — Other Ambulatory Visit: Payer: Self-pay

## 2019-10-22 ENCOUNTER — Other Ambulatory Visit: Payer: Self-pay | Admitting: *Deleted

## 2019-10-22 ENCOUNTER — Telehealth (INDEPENDENT_AMBULATORY_CARE_PROVIDER_SITE_OTHER): Payer: Medicare Other | Admitting: Family Medicine

## 2019-10-22 DIAGNOSIS — J438 Other emphysema: Secondary | ICD-10-CM | POA: Diagnosis not present

## 2019-10-22 DIAGNOSIS — G8929 Other chronic pain: Secondary | ICD-10-CM

## 2019-10-22 DIAGNOSIS — M0579 Rheumatoid arthritis with rheumatoid factor of multiple sites without organ or systems involvement: Secondary | ICD-10-CM

## 2019-10-22 DIAGNOSIS — M25562 Pain in left knee: Secondary | ICD-10-CM

## 2019-10-22 DIAGNOSIS — J209 Acute bronchitis, unspecified: Secondary | ICD-10-CM | POA: Diagnosis not present

## 2019-10-22 DIAGNOSIS — J449 Chronic obstructive pulmonary disease, unspecified: Secondary | ICD-10-CM

## 2019-10-22 MED ORDER — DOXYCYCLINE HYCLATE 100 MG PO CAPS: 100.0000 mg | ORAL_CAPSULE | Freq: Two times a day (BID) | ORAL | 0 refills | Status: AC

## 2019-10-22 MED ORDER — METHYLPREDNISOLONE 4 MG PO TBPK: ORAL_TABLET | ORAL | 0 refills | Status: DC

## 2019-10-22 NOTE — Telephone Encounter (Signed)
Pt's daughter called in and would like to speak with CMA in regards to status of order for medication. [name] states she would like an update so that way pt does not get confused as to what is going on. Please advise and call back is [phone].

## 2019-10-22 NOTE — Telephone Encounter (Signed)
HH order placed and Virtual set up for today.

## 2019-10-22 NOTE — Telephone Encounter (Signed)
Spoke with patient daughter. Informed her that I (clinic RN) informed her mom that she can get a nebulizer machine from the office and just sign the paperwork. Daughter mentioned mom sounds bad like she has pneumonia. Informed daughter that patient unfortunately cant come into the office and would have to be evaluated elsewhere, daughter informed RN that mom is bed bound and that she can do a tele visit with PCP if possible. Also, wants to know if [facility] is still an option. Okay to place an order for [facility]?

## 2019-10-22 NOTE — Progress Notes (Signed)
Virtual Visit via Video Note  I connected with the patient on 10/22/19 at  2:30 PM EST by a video enabled telemedicine application and verified that I am speaking with the correct person using two identifiers.  Location patient: home Location provider:work or home office Persons participating in the virtual visit: patient, provider  I discussed the limitations of evaluation and management by telemedicine and the availability of in person appointments. The patient expressed understanding and agreed to proceed.   HPI: Here for 2 weeks of chest congestion, wheezing, mild SOB, and a dry cough. No chest pain or fever. No ST or headache. No body aches or NVD. She is using her albuterol inhaler, and she is using Duonebs in her nebulizer. Her oxygne saturations have been stable, and in fact this is 94% as we speak. She has trouble walking so she is housebound, and she cannot get out to be tested for the Covid-19 virus.    ROS: See pertinent positives and negatives per HPI.  Past Medical History:  Diagnosis Date  . COPD (chronic obstructive pulmonary disease) ([place])   . Discoid lupus    sees Dr. Allyson Sabal  . GERD (gastroesophageal reflux disease)   . History of colonic polyps   . Hx pulmonary embolism   . Hypothyroidism   . Rheumatoid arthritis(714.0)    sees Dr. Dossie Der   . Thyroid disease     Past Surgical History:  Procedure Laterality Date  . ABDOMINAL HYSTERECTOMY    . arthroscopy right knee    . BREAST EXCISIONAL BIOPSY Right 2011  . carpal tunnel release left wrist    . 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 TONSILLECTOMY      Family History  Problem Relation Age of Onset  . Arthritis Other   . Coronary artery disease Other   . Hypertension Other   . Stroke Other   . Cancer Other        lung  . Lymphoma Son      Current Outpatient Medications:  .  albuterol (PROAIR HFA) 108 (90 Base) MCG/ACT inhaler, INHALE 2 PUFFS INTO THE LUNGS EVERY 4 (FOUR) HOURS AS NEEDED FOR  WHEEZING., Disp: 8.5 Inhaler, Rfl: 11 .  amLODipine (NORVASC) 10 MG tablet, Take 1 tablet (10 mg total) by mouth daily., Disp: 90 tablet, Rfl: 1 .  benzonatate (TESSALON) 200 MG capsule, Take 1 capsule (200 mg total) by mouth 2 (two) times daily as needed for cough., Disp: 60 capsule, Rfl: 1 .  ciprofloxacin (CIPRO) 500 MG tablet, Take 1 tablet (500 mg total) by mouth 2 (two) times daily., Disp: 14 tablet, Rfl: 0 .  DiphenhydrAMINE HCl (BENADRYL ALLERGY PO), Take by mouth., Disp: , Rfl:  .  folic acid (FOLVITE) 1 MG tablet, Take 1 mg by mouth daily.  , Disp: , Rfl:  .  halobetasol (ULTRAVATE) 0.05 % cream, Apply topically 2 (two) times daily., Disp: 50 g, Rfl: 2 .  [START ON 11/09/2019] HYDROcodone-acetaminophen (NORCO) 10-325 MG tablet, Take 1 tablet by mouth every 6 (six) hours as needed for severe pain., Disp: 120 tablet, Rfl: 0 .  ipratropium-albuterol (DUONEB) 0.5-2.5 (3) MG/3ML SOLN, TAKE 3 MLS BY NEBULIZATION EVERY 4 (FOUR) HOURS AS NEEDED., Disp: 360 mL, Rfl: 5 .  losartan (COZAAR) 100 MG tablet, TAKE 1 TABLET BY MOUTH EVERY DAY, Disp: 90 tablet, Rfl: 3 .  metFORMIN (GLUCOPHAGE) 500 MG tablet, TAKE 1 TABLET BY MOUTH TWICE A DAY, Disp: 180 tablet, Rfl: 1 .  methotrexate (RHEUMATREX)  2.5 MG tablet, Take 15 mg by mouth once a week. Take 6 tablets. Caution:Chemotherapy. Protect from light., Disp: , Rfl:  .  methylPREDNISolone (MEDROL DOSEPAK) 4 MG TBPK tablet, As directed, Disp: 21 tablet, Rfl: 0 .  nitrofurantoin, macrocrystal-monohydrate, (MACROBID) 100 MG capsule, Take 1 capsule (100 mg total) by mouth 2 (two) times daily., Disp: 14 capsule, Rfl: 0 .  sertraline (ZOLOFT) 100 MG tablet, TAKE 1 TABLET BY MOUTH EVERY DAY, Disp: 90 tablet, Rfl: 3 .  SYNTHROID 137 MCG tablet, TAKE 1 TABLET BY MOUTH EVERY DAY BEFORE BREAKFAST, Disp: 90 tablet, Rfl: 1 .  warfarin (COUMADIN) 7.5 MG tablet, TAKE 1 TABLET DAILY OR AS DIRECTED BY ANTICOAGULATION CLINIC, Disp: 90 tablet, Rfl: 1  EXAM:  VITALS per  patient if applicable:  GENERAL: alert, oriented, appears well and in no acute distress  HEENT: atraumatic, conjunttiva clear, no obvious abnormalities on inspection of external nose and ears  NECK: normal movements of the head and neck  LUNGS: on inspection no signs of respiratory distress, breathing rate appears normal, no obvious gross SOB, gasping or wheezing  CV: no obvious cyanosis  MS: moves all visible extremities without noticeable abnormality  PSYCH/NEURO: pleasant and cooperative, no obvious depression or anxiety, speech and thought processing grossly intact  ASSESSMENT AND PLAN: This is likely an acute bronchitis on top of COPD. Pneumonia would be less likely. We will treat this with Doxycycline and a steroid taper. Use the nebulizer as needed.  [name], MD  Discussed the following assessment and plan:  No diagnosis found.     I discussed the assessment and treatment plan with the patient. The patient was provided an opportunity to ask questions and all were answered. The patient agreed with the plan and demonstrated an understanding of the instructions.   The patient was advised to call back or seek an in-person evaluation if the symptoms worsen or if the condition fails to improve as anticipated.

## 2019-10-22 NOTE — Telephone Encounter (Signed)
Please order the home health as discussed. Set up a virtual visit with her

## 2019-10-27 ENCOUNTER — Telehealth: Payer: Self-pay | Admitting: General Practice

## 2019-10-27 ENCOUNTER — Telehealth: Payer: Self-pay | Admitting: Family Medicine

## 2019-10-27 NOTE — Telephone Encounter (Signed)
I understand. It think getting [facility] involved is a good idea

## 2019-10-27 NOTE — Telephone Encounter (Signed)
Katie, RN with [facility] stated she is going to be making an APS referral and they will not be accepting under home health services. Stated the pt's needs are beyond what they can provide and she is living in deplorable conditions. Requesting CB. No answer on FC line. Please advise. CB#[phone]

## 2019-10-27 NOTE — Telephone Encounter (Signed)
Information as been sent to [employer] to inquire about home INR monitoring.

## 2019-10-27 NOTE — Telephone Encounter (Signed)
See note

## 2019-10-28 NOTE — Telephone Encounter (Signed)
Jaime Kelley is aware. She stated that she has already called. Nothing further needed.

## 2019-10-30 ENCOUNTER — Other Ambulatory Visit: Payer: Self-pay | Admitting: Family Medicine

## 2019-11-02 DIAGNOSIS — M25561 Pain in right knee: Secondary | ICD-10-CM | POA: Diagnosis not present

## 2019-11-02 DIAGNOSIS — M25562 Pain in left knee: Secondary | ICD-10-CM | POA: Diagnosis not present

## 2019-11-02 DIAGNOSIS — M0579 Rheumatoid arthritis with rheumatoid factor of multiple sites without organ or systems involvement: Secondary | ICD-10-CM | POA: Diagnosis not present

## 2019-11-02 DIAGNOSIS — E039 Hypothyroidism, unspecified: Secondary | ICD-10-CM | POA: Diagnosis not present

## 2019-11-02 DIAGNOSIS — G8929 Other chronic pain: Secondary | ICD-10-CM | POA: Diagnosis not present

## 2019-11-02 DIAGNOSIS — J449 Chronic obstructive pulmonary disease, unspecified: Secondary | ICD-10-CM | POA: Diagnosis not present

## 2019-11-04 ENCOUNTER — Telehealth: Payer: Self-pay | Admitting: Family Medicine

## 2019-11-04 NOTE — Telephone Encounter (Signed)
This is managed by Villa Herb RN

## 2019-11-04 NOTE — Telephone Encounter (Signed)
Home Health Verbal Orders - Caller/Agency: [facility]  Callback Number: [phone] vm can be left on secure line  Requesting OT/PT/Home health aid /medical social worker  Frequency: 1x a week for 1 week  Skilled nursing  Frequency: 1x a week for 3 weeks and  2x's a week for 3 weeks    Pt also needs INR orders

## 2019-11-04 NOTE — Telephone Encounter (Signed)
Orders have been given. Jaime Kelley stated she needs to know when the patients INR should be checked.

## 2019-11-04 NOTE — Telephone Encounter (Signed)
Ok for orders? 

## 2019-11-04 NOTE — Telephone Encounter (Signed)
Message Routed to PCP CMA 

## 2019-11-04 NOTE — Telephone Encounter (Signed)
Please okay these orders  ?

## 2019-11-05 NOTE — Telephone Encounter (Signed)
Noted.  VO given to [place], RN @ [place] to check INR and report to Villa Herb, RN

## 2019-11-06 DIAGNOSIS — E039 Hypothyroidism, unspecified: Secondary | ICD-10-CM | POA: Diagnosis not present

## 2019-11-06 DIAGNOSIS — G8929 Other chronic pain: Secondary | ICD-10-CM | POA: Diagnosis not present

## 2019-11-06 DIAGNOSIS — M25562 Pain in left knee: Secondary | ICD-10-CM | POA: Diagnosis not present

## 2019-11-06 DIAGNOSIS — M25561 Pain in right knee: Secondary | ICD-10-CM | POA: Diagnosis not present

## 2019-11-06 DIAGNOSIS — J449 Chronic obstructive pulmonary disease, unspecified: Secondary | ICD-10-CM | POA: Diagnosis not present

## 2019-11-06 DIAGNOSIS — M0579 Rheumatoid arthritis with rheumatoid factor of multiple sites without organ or systems involvement: Secondary | ICD-10-CM | POA: Diagnosis not present

## 2019-11-06 LAB — POCT INR: INR: 2.9 (ref 2.0–3.0)

## 2019-11-10 ENCOUNTER — Ambulatory Visit (INDEPENDENT_AMBULATORY_CARE_PROVIDER_SITE_OTHER): Payer: Medicare Other | Admitting: General Practice

## 2019-11-10 DIAGNOSIS — E039 Hypothyroidism, unspecified: Secondary | ICD-10-CM | POA: Diagnosis not present

## 2019-11-10 DIAGNOSIS — G8929 Other chronic pain: Secondary | ICD-10-CM | POA: Diagnosis not present

## 2019-11-10 DIAGNOSIS — M0579 Rheumatoid arthritis with rheumatoid factor of multiple sites without organ or systems involvement: Secondary | ICD-10-CM | POA: Diagnosis not present

## 2019-11-10 DIAGNOSIS — Z7901 Long term (current) use of anticoagulants: Secondary | ICD-10-CM | POA: Diagnosis not present

## 2019-11-10 DIAGNOSIS — M25561 Pain in right knee: Secondary | ICD-10-CM | POA: Diagnosis not present

## 2019-11-10 DIAGNOSIS — J449 Chronic obstructive pulmonary disease, unspecified: Secondary | ICD-10-CM | POA: Diagnosis not present

## 2019-11-10 DIAGNOSIS — M25562 Pain in left knee: Secondary | ICD-10-CM | POA: Diagnosis not present

## 2019-11-10 NOTE — Patient Instructions (Signed)
Pre visit review using our clinic review tool, if applicable. No additional management support is needed unless otherwise documented below in the visit note.  Continue to take 7.5 mg daily.  Re-check in 4  Weeks.  Patient called with results from [facility] RN with [facility].

## 2019-11-11 DIAGNOSIS — E039 Hypothyroidism, unspecified: Secondary | ICD-10-CM | POA: Diagnosis not present

## 2019-11-11 DIAGNOSIS — G8929 Other chronic pain: Secondary | ICD-10-CM | POA: Diagnosis not present

## 2019-11-11 DIAGNOSIS — J449 Chronic obstructive pulmonary disease, unspecified: Secondary | ICD-10-CM | POA: Diagnosis not present

## 2019-11-11 DIAGNOSIS — M0579 Rheumatoid arthritis with rheumatoid factor of multiple sites without organ or systems involvement: Secondary | ICD-10-CM | POA: Diagnosis not present

## 2019-11-11 DIAGNOSIS — M25562 Pain in left knee: Secondary | ICD-10-CM | POA: Diagnosis not present

## 2019-11-11 DIAGNOSIS — M25561 Pain in right knee: Secondary | ICD-10-CM | POA: Diagnosis not present

## 2019-11-12 ENCOUNTER — Ambulatory Visit (INDEPENDENT_AMBULATORY_CARE_PROVIDER_SITE_OTHER): Payer: Medicare Other | Admitting: General Practice

## 2019-11-12 DIAGNOSIS — M25561 Pain in right knee: Secondary | ICD-10-CM | POA: Diagnosis not present

## 2019-11-12 DIAGNOSIS — J449 Chronic obstructive pulmonary disease, unspecified: Secondary | ICD-10-CM | POA: Diagnosis not present

## 2019-11-12 DIAGNOSIS — M0579 Rheumatoid arthritis with rheumatoid factor of multiple sites without organ or systems involvement: Secondary | ICD-10-CM | POA: Diagnosis not present

## 2019-11-12 DIAGNOSIS — G8929 Other chronic pain: Secondary | ICD-10-CM | POA: Diagnosis not present

## 2019-11-12 DIAGNOSIS — Z7901 Long term (current) use of anticoagulants: Secondary | ICD-10-CM

## 2019-11-12 DIAGNOSIS — M25562 Pain in left knee: Secondary | ICD-10-CM | POA: Diagnosis not present

## 2019-11-12 DIAGNOSIS — E039 Hypothyroidism, unspecified: Secondary | ICD-10-CM | POA: Diagnosis not present

## 2019-11-12 LAB — POCT INR: INR: 3 (ref 2.0–3.0)

## 2019-11-12 NOTE — Patient Instructions (Signed)
Pre visit review using our clinic review tool, if applicable. No additional management support is needed unless otherwise documented below in the visit note.  Take 1/2 tablet today and then continue to take 7.5 mg daily.  Re-check weekly while on patient's services with [facility].  Patient reported INR and reported that Alyse Low is the RN that checked her INR.  I have not yet heard from [facility].

## 2019-11-12 NOTE — Telephone Encounter (Signed)
Message was left on VM to call Villa Herb, RN @ [phone] with patient's INR results.

## 2019-11-12 NOTE — Telephone Encounter (Signed)
Jaime Kelley w/[facility] [phone] wanted the provider to know that they are going to check the patient's INR weekly.  They will check it again today and let you know what it is.

## 2019-11-13 DIAGNOSIS — M25561 Pain in right knee: Secondary | ICD-10-CM | POA: Diagnosis not present

## 2019-11-13 DIAGNOSIS — M25562 Pain in left knee: Secondary | ICD-10-CM | POA: Diagnosis not present

## 2019-11-13 DIAGNOSIS — G8929 Other chronic pain: Secondary | ICD-10-CM | POA: Diagnosis not present

## 2019-11-13 DIAGNOSIS — E039 Hypothyroidism, unspecified: Secondary | ICD-10-CM | POA: Diagnosis not present

## 2019-11-13 DIAGNOSIS — M0579 Rheumatoid arthritis with rheumatoid factor of multiple sites without organ or systems involvement: Secondary | ICD-10-CM | POA: Diagnosis not present

## 2019-11-13 DIAGNOSIS — J449 Chronic obstructive pulmonary disease, unspecified: Secondary | ICD-10-CM | POA: Diagnosis not present

## 2019-11-14 ENCOUNTER — Telehealth: Payer: Self-pay | Admitting: Family Medicine

## 2019-11-14 NOTE — Telephone Encounter (Signed)
Forwarding to Dr Sarajane Jews  PCP

## 2019-11-14 NOTE — Telephone Encounter (Signed)
fyi

## 2019-11-14 NOTE — Telephone Encounter (Signed)
Homehealth calls in saying that when she went to patients house that no one opened the door for her to do the visit.

## 2019-11-17 DIAGNOSIS — M25561 Pain in right knee: Secondary | ICD-10-CM | POA: Diagnosis not present

## 2019-11-17 DIAGNOSIS — M0579 Rheumatoid arthritis with rheumatoid factor of multiple sites without organ or systems involvement: Secondary | ICD-10-CM | POA: Diagnosis not present

## 2019-11-17 DIAGNOSIS — J449 Chronic obstructive pulmonary disease, unspecified: Secondary | ICD-10-CM | POA: Diagnosis not present

## 2019-11-17 DIAGNOSIS — G8929 Other chronic pain: Secondary | ICD-10-CM | POA: Diagnosis not present

## 2019-11-17 DIAGNOSIS — E039 Hypothyroidism, unspecified: Secondary | ICD-10-CM | POA: Diagnosis not present

## 2019-11-17 DIAGNOSIS — M25562 Pain in left knee: Secondary | ICD-10-CM | POA: Diagnosis not present

## 2019-11-18 ENCOUNTER — Telehealth: Payer: Self-pay | Admitting: Family Medicine

## 2019-11-18 DIAGNOSIS — M25561 Pain in right knee: Secondary | ICD-10-CM | POA: Diagnosis not present

## 2019-11-18 DIAGNOSIS — E039 Hypothyroidism, unspecified: Secondary | ICD-10-CM

## 2019-11-18 DIAGNOSIS — J449 Chronic obstructive pulmonary disease, unspecified: Secondary | ICD-10-CM

## 2019-11-18 DIAGNOSIS — M0579 Rheumatoid arthritis with rheumatoid factor of multiple sites without organ or systems involvement: Secondary | ICD-10-CM | POA: Diagnosis not present

## 2019-11-18 DIAGNOSIS — G8929 Other chronic pain: Secondary | ICD-10-CM | POA: Diagnosis not present

## 2019-11-18 DIAGNOSIS — M25562 Pain in left knee: Secondary | ICD-10-CM | POA: Diagnosis not present

## 2019-11-18 LAB — POCT INR: INR: 4.2 — AB (ref 2.0–3.0)

## 2019-11-18 NOTE — Telephone Encounter (Signed)
Jaime Kelley from Jaime Kelley is working with Jaime Kelley to get her on [place]. He is needing a DMA form filled out because Jaime Kelley needs a wheel chair, hospital bed, and transfer board.  Jaime Kelley can be contacted at [phone] or email [email or web address]

## 2019-11-19 DIAGNOSIS — E039 Hypothyroidism, unspecified: Secondary | ICD-10-CM | POA: Diagnosis not present

## 2019-11-19 DIAGNOSIS — M25562 Pain in left knee: Secondary | ICD-10-CM | POA: Diagnosis not present

## 2019-11-19 DIAGNOSIS — M25561 Pain in right knee: Secondary | ICD-10-CM | POA: Diagnosis not present

## 2019-11-19 DIAGNOSIS — M0579 Rheumatoid arthritis with rheumatoid factor of multiple sites without organ or systems involvement: Secondary | ICD-10-CM | POA: Diagnosis not present

## 2019-11-19 DIAGNOSIS — G8929 Other chronic pain: Secondary | ICD-10-CM | POA: Diagnosis not present

## 2019-11-19 DIAGNOSIS — J449 Chronic obstructive pulmonary disease, unspecified: Secondary | ICD-10-CM | POA: Diagnosis not present

## 2019-11-19 NOTE — Telephone Encounter (Signed)
Spoke with the patient she is aware that forms will need to be sent over

## 2019-11-19 NOTE — Telephone Encounter (Signed)
No we do not have these forms. He will need to send them to [place]

## 2019-11-21 ENCOUNTER — Ambulatory Visit (INDEPENDENT_AMBULATORY_CARE_PROVIDER_SITE_OTHER): Payer: Medicare Other | Admitting: General Practice

## 2019-11-21 DIAGNOSIS — E039 Hypothyroidism, unspecified: Secondary | ICD-10-CM | POA: Diagnosis not present

## 2019-11-21 DIAGNOSIS — M0579 Rheumatoid arthritis with rheumatoid factor of multiple sites without organ or systems involvement: Secondary | ICD-10-CM | POA: Diagnosis not present

## 2019-11-21 DIAGNOSIS — M25562 Pain in left knee: Secondary | ICD-10-CM | POA: Diagnosis not present

## 2019-11-21 DIAGNOSIS — Z7901 Long term (current) use of anticoagulants: Secondary | ICD-10-CM | POA: Diagnosis not present

## 2019-11-21 DIAGNOSIS — J449 Chronic obstructive pulmonary disease, unspecified: Secondary | ICD-10-CM | POA: Diagnosis not present

## 2019-11-21 DIAGNOSIS — M25561 Pain in right knee: Secondary | ICD-10-CM | POA: Diagnosis not present

## 2019-11-21 DIAGNOSIS — G8929 Other chronic pain: Secondary | ICD-10-CM | POA: Diagnosis not present

## 2019-11-21 NOTE — Patient Instructions (Signed)
Pre visit review using our clinic review tool, if applicable. No additional management support is needed unless otherwise documented below in the visit note.  Hold for 2 days and then continue to take 7.5 mg daily.  Re-check on Monday or Tuesday.  INR reported by Jinny Blossom, RN @ [facility] [phone].  Patient and RN have been given dosing instructions.

## 2019-11-21 NOTE — Progress Notes (Signed)
Medical screening examination/treatment/procedure(s) were performed by non-physician practitioner and as supervising physician I was immediately available for consultation/collaboration. I agree with above. Duard Spiewak, MD   

## 2019-11-24 DIAGNOSIS — M25561 Pain in right knee: Secondary | ICD-10-CM | POA: Diagnosis not present

## 2019-11-24 DIAGNOSIS — G8929 Other chronic pain: Secondary | ICD-10-CM | POA: Diagnosis not present

## 2019-11-24 DIAGNOSIS — E039 Hypothyroidism, unspecified: Secondary | ICD-10-CM | POA: Diagnosis not present

## 2019-11-24 DIAGNOSIS — J449 Chronic obstructive pulmonary disease, unspecified: Secondary | ICD-10-CM | POA: Diagnosis not present

## 2019-11-24 DIAGNOSIS — M0579 Rheumatoid arthritis with rheumatoid factor of multiple sites without organ or systems involvement: Secondary | ICD-10-CM | POA: Diagnosis not present

## 2019-11-24 DIAGNOSIS — M25562 Pain in left knee: Secondary | ICD-10-CM | POA: Diagnosis not present

## 2019-11-25 ENCOUNTER — Ambulatory Visit (INDEPENDENT_AMBULATORY_CARE_PROVIDER_SITE_OTHER): Payer: Medicare Other | Admitting: General Practice

## 2019-11-25 DIAGNOSIS — E039 Hypothyroidism, unspecified: Secondary | ICD-10-CM | POA: Diagnosis not present

## 2019-11-25 DIAGNOSIS — M0579 Rheumatoid arthritis with rheumatoid factor of multiple sites without organ or systems involvement: Secondary | ICD-10-CM | POA: Diagnosis not present

## 2019-11-25 DIAGNOSIS — M25562 Pain in left knee: Secondary | ICD-10-CM | POA: Diagnosis not present

## 2019-11-25 DIAGNOSIS — Z7901 Long term (current) use of anticoagulants: Secondary | ICD-10-CM

## 2019-11-25 DIAGNOSIS — J449 Chronic obstructive pulmonary disease, unspecified: Secondary | ICD-10-CM | POA: Diagnosis not present

## 2019-11-25 DIAGNOSIS — M25561 Pain in right knee: Secondary | ICD-10-CM | POA: Diagnosis not present

## 2019-11-25 DIAGNOSIS — G8929 Other chronic pain: Secondary | ICD-10-CM | POA: Diagnosis not present

## 2019-11-25 LAB — POCT INR: INR: 3 (ref 2.0–3.0)

## 2019-11-25 NOTE — Patient Instructions (Signed)
Pre visit review using our clinic review tool, if applicable. No additional management support is needed unless otherwise documented below in the visit note. 

## 2019-11-25 NOTE — Progress Notes (Signed)
Medical screening examination/treatment/procedure(s) were performed by non-physician practitioner and as supervising physician I was immediately available for consultation/collaboration. I agree with above. Dorthey Depace, MD   

## 2019-11-27 ENCOUNTER — Telehealth: Payer: Self-pay | Admitting: Family Medicine

## 2019-11-27 DIAGNOSIS — M0579 Rheumatoid arthritis with rheumatoid factor of multiple sites without organ or systems involvement: Secondary | ICD-10-CM | POA: Diagnosis not present

## 2019-11-27 DIAGNOSIS — E039 Hypothyroidism, unspecified: Secondary | ICD-10-CM | POA: Diagnosis not present

## 2019-11-27 DIAGNOSIS — M25561 Pain in right knee: Secondary | ICD-10-CM | POA: Diagnosis not present

## 2019-11-27 DIAGNOSIS — M25562 Pain in left knee: Secondary | ICD-10-CM | POA: Diagnosis not present

## 2019-11-27 DIAGNOSIS — J449 Chronic obstructive pulmonary disease, unspecified: Secondary | ICD-10-CM | POA: Diagnosis not present

## 2019-11-27 DIAGNOSIS — G8929 Other chronic pain: Secondary | ICD-10-CM | POA: Diagnosis not present

## 2019-11-27 NOTE — Telephone Encounter (Signed)
Let the Dr. Gwyndolyn Kaufman that we are discharging this patient from home therapy per patient.  Home health has only seen her for evaluation and she refused to get help to go down stairs and only wanted to stay  in her bed. She's been doing that since about June 2020.  The only way she can get out of the house is by ambulance.  [employer] [phone]

## 2019-11-28 ENCOUNTER — Telehealth: Payer: Self-pay | Admitting: Family Medicine

## 2019-11-28 NOTE — Telephone Encounter (Signed)
[facility] worker is call in to see if he can get order for the pt to receive a Hospital bed, wheelchair and transfer board.  You can call [name] back with [facility].

## 2019-11-28 NOTE — Telephone Encounter (Signed)
Noted. Will send to Dr. Fry as FYI 

## 2019-12-01 NOTE — Telephone Encounter (Signed)
Spoke with [name]. Orders have been faxed to [phone]

## 2019-12-01 NOTE — Telephone Encounter (Signed)
The order is ready to fax  

## 2019-12-02 DIAGNOSIS — J449 Chronic obstructive pulmonary disease, unspecified: Secondary | ICD-10-CM | POA: Diagnosis not present

## 2019-12-02 DIAGNOSIS — G8929 Other chronic pain: Secondary | ICD-10-CM | POA: Diagnosis not present

## 2019-12-02 DIAGNOSIS — E039 Hypothyroidism, unspecified: Secondary | ICD-10-CM | POA: Diagnosis not present

## 2019-12-02 DIAGNOSIS — M0579 Rheumatoid arthritis with rheumatoid factor of multiple sites without organ or systems involvement: Secondary | ICD-10-CM | POA: Diagnosis not present

## 2019-12-02 DIAGNOSIS — M25562 Pain in left knee: Secondary | ICD-10-CM | POA: Diagnosis not present

## 2019-12-02 DIAGNOSIS — M25561 Pain in right knee: Secondary | ICD-10-CM | POA: Diagnosis not present

## 2019-12-03 DIAGNOSIS — M25561 Pain in right knee: Secondary | ICD-10-CM | POA: Diagnosis not present

## 2019-12-03 DIAGNOSIS — G8929 Other chronic pain: Secondary | ICD-10-CM | POA: Diagnosis not present

## 2019-12-03 DIAGNOSIS — J449 Chronic obstructive pulmonary disease, unspecified: Secondary | ICD-10-CM | POA: Diagnosis not present

## 2019-12-03 DIAGNOSIS — E039 Hypothyroidism, unspecified: Secondary | ICD-10-CM | POA: Diagnosis not present

## 2019-12-03 DIAGNOSIS — M25562 Pain in left knee: Secondary | ICD-10-CM | POA: Diagnosis not present

## 2019-12-03 DIAGNOSIS — M0579 Rheumatoid arthritis with rheumatoid factor of multiple sites without organ or systems involvement: Secondary | ICD-10-CM | POA: Diagnosis not present

## 2019-12-17 DIAGNOSIS — G8929 Other chronic pain: Secondary | ICD-10-CM | POA: Diagnosis not present

## 2019-12-17 DIAGNOSIS — J449 Chronic obstructive pulmonary disease, unspecified: Secondary | ICD-10-CM | POA: Diagnosis not present

## 2019-12-17 DIAGNOSIS — M25562 Pain in left knee: Secondary | ICD-10-CM | POA: Diagnosis not present

## 2019-12-17 DIAGNOSIS — M0579 Rheumatoid arthritis with rheumatoid factor of multiple sites without organ or systems involvement: Secondary | ICD-10-CM | POA: Diagnosis not present

## 2019-12-17 DIAGNOSIS — E039 Hypothyroidism, unspecified: Secondary | ICD-10-CM | POA: Diagnosis not present

## 2019-12-17 DIAGNOSIS — M25561 Pain in right knee: Secondary | ICD-10-CM | POA: Diagnosis not present

## 2019-12-19 ENCOUNTER — Ambulatory Visit (INDEPENDENT_AMBULATORY_CARE_PROVIDER_SITE_OTHER): Payer: Medicare Other | Admitting: General Practice

## 2019-12-19 DIAGNOSIS — Z7901 Long term (current) use of anticoagulants: Secondary | ICD-10-CM | POA: Diagnosis not present

## 2019-12-19 LAB — POCT INR: INR: 2.9 (ref 2.0–3.0)

## 2019-12-19 NOTE — Patient Instructions (Addendum)
Pre visit review using our clinic review tool, if applicable. No additional management support is needed unless otherwise documented below in the visit note.  Take 7.5 mg tablet daily. Re-check in 2 weeks.   INR reported by Alyse Low, RN @ [facility].  Patient and RN have been given dosing instructions.  Patient will be discharged from [facility] at the next visit.

## 2019-12-19 NOTE — Progress Notes (Signed)
Medical screening examination/treatment/procedure(s) were performed by non-physician practitioner and as supervising physician I was immediately available for consultation/collaboration. I agree with above. Odessia Asleson, MD   

## 2019-12-26 ENCOUNTER — Telehealth: Payer: Self-pay | Admitting: Family Medicine

## 2019-12-26 DIAGNOSIS — J449 Chronic obstructive pulmonary disease, unspecified: Secondary | ICD-10-CM | POA: Diagnosis not present

## 2019-12-26 DIAGNOSIS — E039 Hypothyroidism, unspecified: Secondary | ICD-10-CM | POA: Diagnosis not present

## 2019-12-26 DIAGNOSIS — M0579 Rheumatoid arthritis with rheumatoid factor of multiple sites without organ or systems involvement: Secondary | ICD-10-CM | POA: Diagnosis not present

## 2019-12-26 DIAGNOSIS — G8929 Other chronic pain: Secondary | ICD-10-CM | POA: Diagnosis not present

## 2019-12-26 DIAGNOSIS — M25561 Pain in right knee: Secondary | ICD-10-CM | POA: Diagnosis not present

## 2019-12-26 DIAGNOSIS — M25562 Pain in left knee: Secondary | ICD-10-CM | POA: Diagnosis not present

## 2019-12-26 NOTE — Telephone Encounter (Signed)
Last filled 11/09/2019 Last OV 10/22/2019  Ok to fill?

## 2019-12-26 NOTE — Telephone Encounter (Signed)
She needs a PMV 

## 2019-12-26 NOTE — Telephone Encounter (Signed)
Medication Refill: Hydrocodone [facility]acy: [facility]  Phone:

## 2019-12-29 ENCOUNTER — Other Ambulatory Visit: Payer: Self-pay | Admitting: Family Medicine

## 2019-12-29 NOTE — Telephone Encounter (Signed)
Pt call and need a refill on   HYDROcodone-acetaminophen (NORCO/VICODIN) 5-325 MG tablet   [id number] [address], [place] - [place] Phone:  [phone]  Fax:  [phone]

## 2019-12-29 NOTE — Telephone Encounter (Signed)
Please advise. Rx is not on the current med list 

## 2019-12-30 NOTE — Telephone Encounter (Signed)
Left message for patient to call back  

## 2019-12-30 NOTE — Telephone Encounter (Signed)
She needs a PMV 

## 2019-12-31 ENCOUNTER — Ambulatory Visit (INDEPENDENT_AMBULATORY_CARE_PROVIDER_SITE_OTHER): Payer: Medicare Other | Admitting: General Practice

## 2019-12-31 DIAGNOSIS — E039 Hypothyroidism, unspecified: Secondary | ICD-10-CM | POA: Diagnosis not present

## 2019-12-31 DIAGNOSIS — J449 Chronic obstructive pulmonary disease, unspecified: Secondary | ICD-10-CM | POA: Diagnosis not present

## 2019-12-31 DIAGNOSIS — Z7901 Long term (current) use of anticoagulants: Secondary | ICD-10-CM

## 2019-12-31 DIAGNOSIS — M25562 Pain in left knee: Secondary | ICD-10-CM | POA: Diagnosis not present

## 2019-12-31 DIAGNOSIS — M25561 Pain in right knee: Secondary | ICD-10-CM | POA: Diagnosis not present

## 2019-12-31 DIAGNOSIS — G8929 Other chronic pain: Secondary | ICD-10-CM | POA: Diagnosis not present

## 2019-12-31 DIAGNOSIS — M0579 Rheumatoid arthritis with rheumatoid factor of multiple sites without organ or systems involvement: Secondary | ICD-10-CM | POA: Diagnosis not present

## 2019-12-31 LAB — POCT INR: INR: 3.2 — AB (ref 2.0–3.0)

## 2019-12-31 NOTE — Patient Instructions (Addendum)
Pre visit review using our clinic review tool, if applicable. No additional management support is needed unless otherwise documented below in the visit note.  Hold coumadin today and then change dosage and take 7.5 mg daily and a half tablet (3.75 mg) on Wednesdays.  Re-check in 3 to 4 weeks.   INR reported by Alyse Low, RN @ [facility].  LMOVM for RN and patient with dosing instructions.

## 2020-01-02 ENCOUNTER — Telehealth (INDEPENDENT_AMBULATORY_CARE_PROVIDER_SITE_OTHER): Payer: Medicare Other | Admitting: Family Medicine

## 2020-01-02 ENCOUNTER — Other Ambulatory Visit: Payer: Self-pay

## 2020-01-02 DIAGNOSIS — F1190 Opioid use, unspecified, uncomplicated: Secondary | ICD-10-CM | POA: Diagnosis not present

## 2020-01-02 DIAGNOSIS — M0579 Rheumatoid arthritis with rheumatoid factor of multiple sites without organ or systems involvement: Secondary | ICD-10-CM

## 2020-01-02 MED ORDER — HYDROCODONE-ACETAMINOPHEN 10-325 MG PO TABS: 1.0000 | ORAL_TABLET | Freq: Four times a day (QID) | ORAL | 0 refills | Status: AC | PRN

## 2020-01-02 MED ORDER — HYDROCODONE-ACETAMINOPHEN 10-325 MG PO TABS: 1.0000 | ORAL_TABLET | Freq: Four times a day (QID) | ORAL | 0 refills | Status: DC | PRN

## 2020-01-02 MED ORDER — ALBUTEROL SULFATE HFA 108 (90 BASE) MCG/ACT IN AERS: INHALATION_SPRAY | RESPIRATORY_TRACT | 11 refills | Status: AC

## 2020-01-02 NOTE — Telephone Encounter (Signed)
Appointment scheduled.

## 2020-01-02 NOTE — Progress Notes (Signed)
Virtual Visit via Telephone Note  I connected with the patient on 01/02/20 at 10:45 AM EST by telephone and verified that I am speaking with the correct person using two identifiers.   I discussed the limitations, risks, security and privacy concerns of performing an evaluation and management service by telephone and the availability of in person appointments. I also discussed with the patient that there may be a patient responsible charge related to this service. The patient expressed understanding and agreed to proceed.  Location patient: home Location provider: work or home office Participants present for the call: patient, provider Patient did not have a visit in the prior 7 days to address this/these issue(s).   History of Present Illness: Here for pain management, she is doing well.  Indication for chronic opioid: rheumatoid arthritis Medication and dose: Norco 10-325 # pills per month: 120 Last UDS date: 10-24-18 Opioid Treatment Agreement signed (Y/N): 10-26-17 Opioid Treatment Agreement last reviewed with patient:  01-02-20 NCCSRS reviewed this encounter (include red flags): Yes    Observations/Objective: Patient sounds cheerful and well on the phone. I do not appreciate any SOB. Speech and thought processing are grossly intact. Patient reported vitals:  Assessment and Plan: Pain management, meds were refilled.  [name], MD   Follow Up Instructions:     [phone] 5-10 [phone] 11-20 [phone] 21-30 I did not refer this patient for an OV in the next 24 hours for this/these issue(s).  I discussed the assessment and treatment plan with the patient. The patient was provided an opportunity to ask questions and all were answered. The patient agreed with the plan and demonstrated an understanding of the instructions.   The patient was advised to call back or seek an in-person evaluation if the symptoms worsen or if the condition fails to improve as anticipated.  I provided 12  minutes of non-face-to-face time during this encounter.   [name], MD

## 2020-01-11 ENCOUNTER — Other Ambulatory Visit: Payer: Self-pay | Admitting: Family Medicine

## 2020-01-13 ENCOUNTER — Telehealth: Payer: Self-pay

## 2020-01-13 NOTE — Telephone Encounter (Addendum)
Will reach out to patient this week to assess and start patient assistance for Xarelto.   ----- Message from Warden Fillers, RN sent at 01/13/2020  2:23 PM EDT ----- Regarding: FW: Anticoagulation Hi Korbin Notaro!  Patient has been taking coumadin for years for is unable to come to clinic and unable to afford a home monitoring service.  Dr. Sarajane Jews asked that you reach out to her about Xarelto and wanted to know if you could help her with a Patient assistance program.  Any help would be greatly appreciated.  A home health RN has been checking her INR for the past few weeks but that isn't a long term fix.  Thanks so much for anything you may be able to do to help!  Villa Herb, RN ----- Message ----- From: Laurey Morale, MD Sent: 01/13/2020   1:46 PM EDT To: Warden Fillers, RN Subject: RE: Anticoagulation                            Yes, I would refer this issue to Earnie Larsson, our new PharmD. I suggest she get off Coumadin and start on something like Xarelto every day. Anne Ng could help her apply for a patient assistance program  ----- Message ----- From: Warden Fillers, RN Sent: 01/13/2020   1:09 PM EDT To: Laurey Morale, MD Subject: Anticoagulation                                Hey Dr. Sarajane Jews,  Home health has discharged patient so I'm not sure how I'm going to follower her INR.  She is not able to come to the office and is unable to afford home monitoring.  Any suggestions?  Villa Herb, RN

## 2020-01-13 NOTE — Telephone Encounter (Signed)
Sees the Coumadin clinic

## 2020-01-19 ENCOUNTER — Telehealth: Payer: Self-pay | Admitting: General Practice

## 2020-01-19 NOTE — Telephone Encounter (Signed)
LMOVM to call Cindy Richa Shor, RN @ [phone]/ 

## 2020-01-27 ENCOUNTER — Other Ambulatory Visit: Payer: Self-pay | Admitting: Family Medicine

## 2020-04-05 ENCOUNTER — Telehealth: Payer: Self-pay | Admitting: General Practice

## 2020-04-05 ENCOUNTER — Encounter: Payer: Self-pay | Admitting: General Practice

## 2020-04-05 NOTE — Telephone Encounter (Signed)
Attempted to reach out to patient but no answer and VM is full.   I wanted to let patient know that we have a pharmacist on staff that would like to reach out and see if they can offer any help with medications.

## 2020-04-09 ENCOUNTER — Other Ambulatory Visit: Payer: Self-pay | Admitting: Family Medicine

## 2020-04-12 NOTE — Telephone Encounter (Signed)
Closing encounter.   Another encounter is open. Working with Meriam Sprague, RN in attempting to reach patient.   Patient has been mailed letter to call us back.

## 2020-04-13 ENCOUNTER — Telehealth: Payer: Self-pay | Admitting: General Practice

## 2020-04-13 NOTE — Telephone Encounter (Signed)
I attempted to call patient again today and not answer and VM is full.  Unable to reach her son but was able to leave a VM on her daughter Christine's phone.  I asked [name] to call Villa Herb, RN @ [phone] or to contact pt and have her call me.

## 2020-04-14 ENCOUNTER — Telehealth: Payer: Self-pay

## 2020-04-14 NOTE — Telephone Encounter (Signed)
Received message from Venita Sheffield.  She was able to reach patient's daughter, Rushie Goltz.   Called daughter and she said she will contact patient to have her reach back to [place]. Daughter requested for [place] to call her back if we do not hear back from patient.

## 2020-04-23 ENCOUNTER — Other Ambulatory Visit: Payer: Self-pay | Admitting: Family Medicine

## 2020-04-30 ENCOUNTER — Telehealth: Payer: Self-pay | Admitting: Family Medicine

## 2020-04-30 NOTE — Telephone Encounter (Signed)
HYDROcodone-acetaminophen (NORCO) 10-325 MG tablet    [id number] [place] [place] - [place] Phone:  [phone]  Fax:  [phone]     The patient is aware that Dr. Sarajane Jews is out of the office.

## 2020-04-30 NOTE — Telephone Encounter (Signed)
Received message from Meriam Sprague, RN.  Patient has a new number: [phone].   Patient is unable to come in for INR checks and unable to afford home monitoring.   Called patient to assist in patient assistance program for either Eliquis or Xarelto. Unable to leave message. Patient to return call to [phone].

## 2020-04-30 NOTE — Telephone Encounter (Signed)
Patient returned call.   Patient reported home INR monitoring is expensive for her since she is only on social security.   Explained patient other options (Eliquis and Xarelto) instead of warfarin and the other options do not require INR/PT monitoring.   Based on patient information, patient to apply for ExtraHelp. Patient requested assistance from daughter to help her complete online. Patient refers online versus requesting application through the mail.   Plan Plan to contact daughter per patient request and explain ExtraHelp application. Daughter to call patient and complete application together.   Anson Crofts, PharmD Clinical Pharmacist [facility] [place] [phone]

## 2020-04-30 NOTE — Telephone Encounter (Signed)
Okay for refill?   Lov 01/02/2020 PMV  Last refill info:   Dispense: 120 tablet  Refills: 0 ordered  [id number] [address], [place] (Ph: [phone])  Order Details Ordered on: 01/02/20  Start date: 03/03/20  Expired on: 04/02/20

## 2020-04-30 NOTE — Telephone Encounter (Signed)
Patient's daughter Rushie Goltz returned call.   Daughter was explained patient assistance program (Extra Help) and patient requested to call her to assist with this.   Daughter agreed to help patient and will call patient back to complete application.

## 2020-04-30 NOTE — Telephone Encounter (Signed)
Called patient's daughter per patient requested.  Left VM to return call.

## 2020-05-03 NOTE — Telephone Encounter (Signed)
Pt has been scheduled for visit.  

## 2020-05-03 NOTE — Telephone Encounter (Signed)
She needs a PMV 

## 2020-05-04 ENCOUNTER — Encounter: Payer: Self-pay | Admitting: Family Medicine

## 2020-05-04 ENCOUNTER — Other Ambulatory Visit: Payer: Self-pay

## 2020-05-04 ENCOUNTER — Telehealth (INDEPENDENT_AMBULATORY_CARE_PROVIDER_SITE_OTHER): Payer: Medicare Other | Admitting: Family Medicine

## 2020-05-04 DIAGNOSIS — M0579 Rheumatoid arthritis with rheumatoid factor of multiple sites without organ or systems involvement: Secondary | ICD-10-CM | POA: Diagnosis not present

## 2020-05-04 DIAGNOSIS — F1190 Opioid use, unspecified, uncomplicated: Secondary | ICD-10-CM | POA: Diagnosis not present

## 2020-05-04 MED ORDER — HYDROCODONE-ACETAMINOPHEN 10-325 MG PO TABS: 1.0000 | ORAL_TABLET | Freq: Four times a day (QID) | ORAL | 0 refills | Status: DC | PRN

## 2020-05-04 NOTE — Progress Notes (Signed)
° °  Subjective:    Patient ID: Jaime Kelley, female    DOB: [birth year], 69 y.o.   MRN: [id number]  HPI Virtual Visit via Telephone Note  I connected with the patient on 05/04/20 at  2:30 PM EDT by telephone and verified that I am speaking with the correct person using two identifiers.   I discussed the limitations, risks, security and privacy concerns of performing an evaluation and management service by telephone and the availability of in person appointments. I also discussed with the patient that there may be a patient responsible charge related to this service. The patient expressed understanding and agreed to proceed.  Location patient: home Location provider: work or home office Participants present for the call: patient, provider Patient did not have a visit in the prior 7 days to address this/these issue(s).   History of Present Illness: Here for pain management, she is doing well.  Indication for chronic opioid: rheumatoid arthritis  Medication and dose: Norco 10-325 # pills per month: 120 Last UDS date: 10-24-18 Opioid Treatment Agreement signed (Y/N): 10-26-17 Opioid Treatment Agreement last reviewed with patient:  05-04-20 NCCSRS reviewed this encounter (include red flags): Yes    Observations/Objective: Patient sounds cheerful and well on the phone. I do not appreciate any SOB. Speech and thought processing are grossly intact. Patient reported vitals:  Assessment and Plan: Pain management, meds were refilled. [name], MD   Follow Up Instructions:     [phone] 5-10 [phone] 11-20 9443 21-30 I did not refer this patient for an OV in the next 24 hours for this/these issue(s).  I discussed the assessment and treatment plan with the patient. The patient was provided an opportunity to ask questions and all were answered. The patient agreed with the plan and demonstrated an understanding of the instructions.   The patient was advised to call back or seek an  in-person evaluation if the symptoms worsen or if the condition fails to improve as anticipated.  I provided 15 minutes of non-face-to-face time during this encounter.   [name], MD    Review of Systems     Objective:   Physical Exam        Assessment & Plan:

## 2020-05-20 ENCOUNTER — Telehealth: Payer: Self-pay

## 2020-05-31 NOTE — Progress Notes (Addendum)
    Chronic Care Management Pharmacy Assistant   Name: Jaime Kelley  MRN: [id number] DOB: Nov 14, 1950  Reason for Encounter: Patient Assistance . PCP : Laurey Morale, MD  Allergies:   Allergies  Allergen Reactions   Venlafaxine Rash   Effexor [Venlafaxine Hydrochloride] Rash    Medications: Outpatient Encounter Medications as of 05/20/2020  Medication Sig   albuterol (PROAIR HFA) 108 (90 Base) MCG/ACT inhaler INHALE 2 PUFFS INTO THE LUNGS EVERY 4 (FOUR) HOURS AS NEEDED FOR WHEEZING.   amLODipine (NORVASC) 10 MG tablet TAKE 1 TABLET BY MOUTH EVERY DAY   DiphenhydrAMINE HCl (BENADRYL ALLERGY PO) Take by mouth.   folic acid (FOLVITE) 1 MG tablet Take 1 mg by mouth daily.     [START ON 07/05/2020] HYDROcodone-acetaminophen (NORCO) 10-325 MG tablet Take 1 tablet by mouth every 6 (six) hours as needed for moderate pain.   ipratropium-albuterol (DUONEB) 0.5-2.5 (3) MG/3ML SOLN TAKE 3 MLS BY NEBULIZATION EVERY 4 (FOUR) HOURS AS NEEDED.   losartan (COZAAR) 100 MG tablet TAKE 1 TABLET BY MOUTH EVERY DAY   metFORMIN (GLUCOPHAGE) 500 MG tablet TAKE 1 TABLET BY MOUTH TWICE A DAY   methotrexate (RHEUMATREX) 2.5 MG tablet Take 15 mg by mouth once a week. Take 6 tablets. Caution:Chemotherapy. Protect from light.   methylPREDNISolone (MEDROL DOSEPAK) 4 MG TBPK tablet As directed   sertraline (ZOLOFT) 100 MG tablet TAKE 1 TABLET BY MOUTH EVERY DAY   SYNTHROID 137 MCG tablet TAKE 1 TABLET BY MOUTH EVERY DAY BEFORE BREAKFAST   warfarin (COUMADIN) 7.5 MG tablet Take 1 tablet daily except take 1/2 tablet on Wed or AS DIRECTED BY ANTICOAGULATION CLINIC   No facility-administered encounter medications on file as of 05/20/2020.    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) 11/05/2010   BACK STRAIN, LUMBAR 07/09/2009   Rheumatoid arthritis ([place]) 02/10/2009   THUMB SPRAIN 06/26/2008   COPD GOLD III 10/31/2007   BRONCHITIS, ACUTE 06/17/2007   EMBOLISM/THROMBOSIS, DEEP VSL PRXML LWR EXTRM 05/31/2007   Hypothyroidism 04/15/2007   EMPHYSEMA 04/15/2007   GERD 04/15/2007   LUPUS ERYTHEMATOSUS, DISCOID 04/15/2007   PULMONARY EMBOLISM, HX OF 04/15/2007   Personal history of colonic polyps 04/15/2007   DIVERTICULITIS, HX OF 04/15/2007    Goals Addressed   None    Fanny Skates, CMA Clinical Pharmacist Assistant [phone]  Follow-Up:  Pharmacist Review

## 2020-06-08 NOTE — Progress Notes (Signed)
Attempts to reach patient have been unsuccessful. Called daughter, and daughter also states she has been unsuccessful reaching patient.   After calling patient's home, patient's house mate stated they were told by PCP they do not need to initiate anticoag medication.   After speaking to Dr. Sarajane Jews, patient is to continue/ apply for patient assistance to start a DOAC unless patient states she does not want to be on medications.    Plan  Attempt to call patient  and/ or daughter to communicate above message.    Anson Crofts, PharmD Clinical Pharmacist [facility] [place] [phone]

## 2020-06-09 ENCOUNTER — Telehealth: Payer: Self-pay | Admitting: Family Medicine

## 2020-06-09 NOTE — Telephone Encounter (Signed)
Pt call and want a call back about who dr.Fry spoke with about pro time machine for patients .

## 2020-06-10 ENCOUNTER — Telehealth: Payer: Self-pay | Admitting: General Practice

## 2020-06-10 NOTE — Telephone Encounter (Signed)
She is talking about a machine to check her Coumadin levels at home. I am not sure how she could obtain one of these so I will ask Villa Herb about this

## 2020-06-10 NOTE — Telephone Encounter (Signed)
Hi Dr. Sarajane Jews,  I tried to set her up with home INR monitoring  months ago and she encountered several obstacles that prevented it.  I will try to contact her to discuss this again.  She is very difficult to reach as she doesn't answer her phone and her VM is always full.  Anyway I will  try to reach out to her and maybe we can proceed.  Villa Herb, RN

## 2020-06-10 NOTE — Telephone Encounter (Signed)
Attempted to reach patient to discuss home INR monitoring.  No answer and unable to leave message.  Will try again at a later date.

## 2020-06-11 NOTE — Telephone Encounter (Signed)
That sounds good, thanks

## 2020-06-17 ENCOUNTER — Telehealth: Payer: Self-pay | Admitting: General Practice

## 2020-06-17 NOTE — Telephone Encounter (Signed)
Information sent to MD INR to try and get patient set up for home INR monitoring.

## 2020-06-25 ENCOUNTER — Telehealth: Payer: Self-pay | Admitting: Family Medicine

## 2020-06-25 MED ORDER — CHLORDIAZEPOXIDE HCL 5 MG PO CAPS: 5.0000 mg | ORAL_CAPSULE | Freq: Three times a day (TID) | ORAL | 0 refills | Status: DC | PRN

## 2020-06-25 NOTE — Telephone Encounter (Signed)
Spoke with the patient. She is aware that her medication has been sent in.

## 2020-06-25 NOTE — Addendum Note (Signed)
Addended by: [name] A on: 06/25/2020 05:09 PM   Modules accepted: Orders

## 2020-06-25 NOTE — Telephone Encounter (Signed)
Please advise. Would you prefer the patient have an appointment?

## 2020-06-25 NOTE — Telephone Encounter (Signed)
The patient contacted the office to get a Rx refill for Librium 10 MG. She stated that she hasn't taken this medicine in over 20 years and that this is the only medication that has helped with he anxiety.  I stated to the patient that we can schedule a virtual appointment next Tuesday with Dr. Sarajane Jews so she can discuss this medication with Dr. Sarajane Jews. She can't wait til Tuesday and would like for Dr. Sarajane Jews to send in the Rx today.  [id number] [address], [place] Phone:  [phone]  Fax:  [phone]

## 2020-06-25 NOTE — Telephone Encounter (Signed)
I sent the Librium to her pharmacy

## 2020-06-29 ENCOUNTER — Encounter: Payer: Self-pay | Admitting: Family Medicine

## 2020-07-02 ENCOUNTER — Other Ambulatory Visit: Payer: Self-pay | Admitting: Family Medicine

## 2020-07-09 DIAGNOSIS — Z7901 Long term (current) use of anticoagulants: Secondary | ICD-10-CM | POA: Diagnosis not present

## 2020-07-09 DIAGNOSIS — Z86718 Personal history of other venous thrombosis and embolism: Secondary | ICD-10-CM | POA: Diagnosis not present

## 2020-07-10 ENCOUNTER — Inpatient Hospital Stay (HOSPITAL_COMMUNITY)
Admission: EM | Admit: 2020-07-10 | Discharge: 2020-07-27 | DRG: 205 | Disposition: A | Discharge status: Skilled Nursing Facility | Payer: Medicare Other | Attending: Internal Medicine | Admitting: Internal Medicine

## 2020-07-10 ENCOUNTER — Encounter (HOSPITAL_COMMUNITY): Payer: Self-pay

## 2020-07-10 ENCOUNTER — Other Ambulatory Visit: Payer: Self-pay

## 2020-07-10 ENCOUNTER — Emergency Department (HOSPITAL_COMMUNITY): Payer: Medicare Other

## 2020-07-10 DIAGNOSIS — J9809 Other diseases of bronchus, not elsewhere classified: Secondary | ICD-10-CM | POA: Diagnosis not present

## 2020-07-10 DIAGNOSIS — Z87891 Personal history of nicotine dependence: Secondary | ICD-10-CM

## 2020-07-10 DIAGNOSIS — E039 Hypothyroidism, unspecified: Secondary | ICD-10-CM | POA: Diagnosis present

## 2020-07-10 DIAGNOSIS — M069 Rheumatoid arthritis, unspecified: Secondary | ICD-10-CM | POA: Diagnosis present

## 2020-07-10 DIAGNOSIS — R2681 Unsteadiness on feet: Secondary | ICD-10-CM | POA: Diagnosis present

## 2020-07-10 DIAGNOSIS — K219 Gastro-esophageal reflux disease without esophagitis: Secondary | ICD-10-CM | POA: Diagnosis present

## 2020-07-10 DIAGNOSIS — E1151 Type 2 diabetes mellitus with diabetic peripheral angiopathy without gangrene: Secondary | ICD-10-CM | POA: Diagnosis present

## 2020-07-10 DIAGNOSIS — M329 Systemic lupus erythematosus, unspecified: Secondary | ICD-10-CM | POA: Diagnosis present

## 2020-07-10 DIAGNOSIS — Z7901 Long term (current) use of anticoagulants: Secondary | ICD-10-CM | POA: Diagnosis not present

## 2020-07-10 DIAGNOSIS — E872 Acidosis: Secondary | ICD-10-CM | POA: Diagnosis present

## 2020-07-10 DIAGNOSIS — H9190 Unspecified hearing loss, unspecified ear: Secondary | ICD-10-CM | POA: Diagnosis present

## 2020-07-10 DIAGNOSIS — J90 Pleural effusion, not elsewhere classified: Secondary | ICD-10-CM | POA: Diagnosis not present

## 2020-07-10 DIAGNOSIS — R791 Abnormal coagulation profile: Secondary | ICD-10-CM | POA: Diagnosis present

## 2020-07-10 DIAGNOSIS — Z9689 Presence of other specified functional implants: Secondary | ICD-10-CM | POA: Diagnosis not present

## 2020-07-10 DIAGNOSIS — R410 Disorientation, unspecified: Secondary | ICD-10-CM | POA: Diagnosis not present

## 2020-07-10 DIAGNOSIS — K5641 Fecal impaction: Secondary | ICD-10-CM | POA: Diagnosis present

## 2020-07-10 DIAGNOSIS — Z9889 Other specified postprocedural states: Secondary | ICD-10-CM

## 2020-07-10 DIAGNOSIS — J449 Chronic obstructive pulmonary disease, unspecified: Secondary | ICD-10-CM | POA: Diagnosis present

## 2020-07-10 DIAGNOSIS — F419 Anxiety disorder, unspecified: Secondary | ICD-10-CM | POA: Diagnosis present

## 2020-07-10 DIAGNOSIS — I10 Essential (primary) hypertension: Secondary | ICD-10-CM | POA: Diagnosis present

## 2020-07-10 DIAGNOSIS — Z86711 Personal history of pulmonary embolism: Secondary | ICD-10-CM

## 2020-07-10 DIAGNOSIS — E890 Postprocedural hypothyroidism: Secondary | ICD-10-CM | POA: Diagnosis present

## 2020-07-10 DIAGNOSIS — Z7401 Bed confinement status: Secondary | ICD-10-CM | POA: Diagnosis not present

## 2020-07-10 DIAGNOSIS — Z86718 Personal history of other venous thrombosis and embolism: Secondary | ICD-10-CM | POA: Diagnosis not present

## 2020-07-10 DIAGNOSIS — R0600 Dyspnea, unspecified: Secondary | ICD-10-CM

## 2020-07-10 DIAGNOSIS — I517 Cardiomegaly: Secondary | ICD-10-CM | POA: Diagnosis not present

## 2020-07-10 DIAGNOSIS — J939 Pneumothorax, unspecified: Secondary | ICD-10-CM | POA: Diagnosis not present

## 2020-07-10 DIAGNOSIS — L89312 Pressure ulcer of right buttock, stage 2: Secondary | ICD-10-CM | POA: Diagnosis present

## 2020-07-10 DIAGNOSIS — T17500A Unspecified foreign body in bronchus causing asphyxiation, initial encounter: Secondary | ICD-10-CM | POA: Diagnosis not present

## 2020-07-10 DIAGNOSIS — Z9109 Other allergy status, other than to drugs and biological substances: Secondary | ICD-10-CM

## 2020-07-10 DIAGNOSIS — R079 Chest pain, unspecified: Secondary | ICD-10-CM | POA: Diagnosis not present

## 2020-07-10 DIAGNOSIS — M2550 Pain in unspecified joint: Secondary | ICD-10-CM | POA: Diagnosis not present

## 2020-07-10 DIAGNOSIS — E119 Type 2 diabetes mellitus without complications: Secondary | ICD-10-CM

## 2020-07-10 DIAGNOSIS — E669 Obesity, unspecified: Secondary | ICD-10-CM | POA: Diagnosis not present

## 2020-07-10 DIAGNOSIS — J9621 Acute and chronic respiratory failure with hypoxia: Secondary | ICD-10-CM | POA: Diagnosis present

## 2020-07-10 DIAGNOSIS — M6281 Muscle weakness (generalized): Secondary | ICD-10-CM | POA: Diagnosis present

## 2020-07-10 DIAGNOSIS — Z8719 Personal history of other diseases of the digestive system: Secondary | ICD-10-CM

## 2020-07-10 DIAGNOSIS — Z8249 Family history of ischemic heart disease and other diseases of the circulatory system: Secondary | ICD-10-CM

## 2020-07-10 DIAGNOSIS — D649 Anemia, unspecified: Secondary | ICD-10-CM

## 2020-07-10 DIAGNOSIS — E118 Type 2 diabetes mellitus with unspecified complications: Secondary | ICD-10-CM | POA: Diagnosis present

## 2020-07-10 DIAGNOSIS — J9811 Atelectasis: Principal | ICD-10-CM | POA: Diagnosis present

## 2020-07-10 DIAGNOSIS — Z20822 Contact with and (suspected) exposure to covid-19: Secondary | ICD-10-CM | POA: Diagnosis present

## 2020-07-10 DIAGNOSIS — Z79890 Hormone replacement therapy (postmenopausal): Secondary | ICD-10-CM

## 2020-07-10 DIAGNOSIS — Z79899 Other long term (current) drug therapy: Secondary | ICD-10-CM

## 2020-07-10 DIAGNOSIS — E871 Hypo-osmolality and hyponatremia: Secondary | ICD-10-CM | POA: Diagnosis not present

## 2020-07-10 DIAGNOSIS — I2510 Atherosclerotic heart disease of native coronary artery without angina pectoris: Secondary | ICD-10-CM | POA: Diagnosis present

## 2020-07-10 DIAGNOSIS — Z6832 Body mass index (BMI) 32.0-32.9, adult: Secondary | ICD-10-CM

## 2020-07-10 DIAGNOSIS — I5020 Unspecified systolic (congestive) heart failure: Secondary | ICD-10-CM | POA: Diagnosis present

## 2020-07-10 DIAGNOSIS — D509 Iron deficiency anemia, unspecified: Secondary | ICD-10-CM | POA: Diagnosis not present

## 2020-07-10 DIAGNOSIS — R531 Weakness: Secondary | ICD-10-CM

## 2020-07-10 DIAGNOSIS — J9622 Acute and chronic respiratory failure with hypercapnia: Secondary | ICD-10-CM | POA: Diagnosis not present

## 2020-07-10 DIAGNOSIS — G9389 Other specified disorders of brain: Secondary | ICD-10-CM | POA: Diagnosis not present

## 2020-07-10 DIAGNOSIS — Z4682 Encounter for fitting and adjustment of non-vascular catheter: Secondary | ICD-10-CM | POA: Diagnosis not present

## 2020-07-10 DIAGNOSIS — R52 Pain, unspecified: Secondary | ICD-10-CM | POA: Diagnosis not present

## 2020-07-10 DIAGNOSIS — F329 Major depressive disorder, single episode, unspecified: Secondary | ICD-10-CM | POA: Diagnosis present

## 2020-07-10 DIAGNOSIS — H748X3 Other specified disorders of middle ear and mastoid, bilateral: Secondary | ICD-10-CM | POA: Diagnosis not present

## 2020-07-10 DIAGNOSIS — E1100 Type 2 diabetes mellitus with hyperosmolarity without nonketotic hyperglycemic-hyperosmolar coma (NKHHC): Secondary | ICD-10-CM | POA: Diagnosis not present

## 2020-07-10 DIAGNOSIS — T17590A Other foreign object in bronchus causing asphyxiation, initial encounter: Secondary | ICD-10-CM | POA: Diagnosis present

## 2020-07-10 DIAGNOSIS — J948 Other specified pleural conditions: Secondary | ICD-10-CM | POA: Diagnosis not present

## 2020-07-10 DIAGNOSIS — R069 Unspecified abnormalities of breathing: Secondary | ICD-10-CM

## 2020-07-10 DIAGNOSIS — J323 Chronic sphenoidal sinusitis: Secondary | ICD-10-CM | POA: Diagnosis not present

## 2020-07-10 DIAGNOSIS — R918 Other nonspecific abnormal finding of lung field: Secondary | ICD-10-CM | POA: Diagnosis not present

## 2020-07-10 DIAGNOSIS — J9819 Other pulmonary collapse: Secondary | ICD-10-CM | POA: Diagnosis present

## 2020-07-10 DIAGNOSIS — M0600 Rheumatoid arthritis without rheumatoid factor, unspecified site: Secondary | ICD-10-CM | POA: Diagnosis present

## 2020-07-10 DIAGNOSIS — D473 Essential (hemorrhagic) thrombocythemia: Secondary | ICD-10-CM | POA: Diagnosis not present

## 2020-07-10 DIAGNOSIS — D75839 Thrombocytosis, unspecified: Secondary | ICD-10-CM | POA: Diagnosis present

## 2020-07-10 DIAGNOSIS — Z9981 Dependence on supplemental oxygen: Secondary | ICD-10-CM

## 2020-07-10 DIAGNOSIS — R4182 Altered mental status, unspecified: Secondary | ICD-10-CM | POA: Diagnosis not present

## 2020-07-10 DIAGNOSIS — Z90710 Acquired absence of both cervix and uterus: Secondary | ICD-10-CM

## 2020-07-10 DIAGNOSIS — R0602 Shortness of breath: Secondary | ICD-10-CM | POA: Diagnosis not present

## 2020-07-10 DIAGNOSIS — J439 Emphysema, unspecified: Secondary | ICD-10-CM | POA: Diagnosis not present

## 2020-07-10 DIAGNOSIS — Z9114 Patient's other noncompliance with medication regimen: Secondary | ICD-10-CM

## 2020-07-10 DIAGNOSIS — I700 Atherosclerosis of aorta: Secondary | ICD-10-CM | POA: Diagnosis not present

## 2020-07-10 DIAGNOSIS — J9383 Other pneumothorax: Secondary | ICD-10-CM | POA: Diagnosis present

## 2020-07-10 DIAGNOSIS — M47814 Spondylosis without myelopathy or radiculopathy, thoracic region: Secondary | ICD-10-CM | POA: Diagnosis not present

## 2020-07-10 DIAGNOSIS — F29 Unspecified psychosis not due to a substance or known physiological condition: Secondary | ICD-10-CM | POA: Diagnosis not present

## 2020-07-10 DIAGNOSIS — R0789 Other chest pain: Secondary | ICD-10-CM | POA: Diagnosis not present

## 2020-07-10 DIAGNOSIS — R2689 Other abnormalities of gait and mobility: Secondary | ICD-10-CM | POA: Diagnosis present

## 2020-07-10 HISTORY — DX: Type 2 diabetes mellitus without complications: E11.9

## 2020-07-10 HISTORY — DX: Depression, unspecified: F32.A

## 2020-07-10 HISTORY — DX: Essential (primary) hypertension: I10

## 2020-07-10 HISTORY — DX: Anxiety disorder, unspecified: F41.9

## 2020-07-10 HISTORY — DX: Dyspnea, unspecified: R06.00

## 2020-07-10 LAB — COMPREHENSIVE METABOLIC PANEL
ALT: 8 U/L (ref 0–44)
AST: 13 U/L — ABNORMAL LOW (ref 15–41)
Albumin: 2.7 g/dL — ABNORMAL LOW (ref 3.5–5.0)
Alkaline Phosphatase: 56 U/L (ref 38–126)
Anion gap: 12 (ref 5–15)
BUN: <5 mg/dL — ABNORMAL LOW (ref 8–23)
CO2: 30 mmol/L (ref 22–32)
Calcium: 8.6 mg/dL — ABNORMAL LOW (ref 8.9–10.3)
Chloride: 90 mmol/L — ABNORMAL LOW (ref 98–111)
Creatinine, Ser: 0.52 mg/dL (ref 0.44–1.00)
GFR calc Af Amer: >60 mL/min (ref >60)
GFR calc non Af Amer: >60 mL/min (ref >60)
Glucose, Bld: 168 mg/dL — ABNORMAL HIGH (ref 70–99)
Potassium: 3.3 mmol/L — ABNORMAL LOW (ref 3.5–5.1)
Sodium: 132 mmol/L — ABNORMAL LOW (ref 135–145)
Total Bilirubin: 0.4 mg/dL (ref 0.3–1.2)
Total Protein: 6.6 g/dL (ref 6.5–8.1)

## 2020-07-10 LAB — I-STAT ARTERIAL BLOOD GAS, ED
Acid-Base Excess: 9 mmol/L — ABNORMAL HIGH (ref 0.0–2.0)
Bicarbonate: 35.3 mmol/L — ABNORMAL HIGH (ref 20.0–28.0)
Calcium, Ion: 1.16 mmol/L (ref 1.15–1.40)
HCT: 26 % — ABNORMAL LOW (ref 36.0–46.0)
Hemoglobin: 8.8 g/dL — ABNORMAL LOW (ref 12.0–15.0)
O2 Saturation: 97 %
Potassium: 3.7 mmol/L (ref 3.5–5.1)
Sodium: 130 mmol/L — ABNORMAL LOW (ref 135–145)
TCO2: 37 mmol/L — ABNORMAL HIGH (ref 22–32)
pCO2 arterial: 56.5 mmHg — ABNORMAL HIGH (ref 32.0–48.0)
pH, Arterial: 7.404 (ref 7.350–7.450)
pO2, Arterial: 89 mmHg (ref 83.0–108.0)

## 2020-07-10 LAB — CBC WITH DIFFERENTIAL/PLATELET
Abs Immature Granulocytes: 0.02 10*3/uL (ref 0.00–0.07)
Basophils Absolute: 0 10*3/uL (ref 0.0–0.1)
Basophils Relative: 1 %
Eosinophils Absolute: 0.2 10*3/uL (ref 0.0–0.5)
Eosinophils Relative: 3 %
HCT: 26.8 % — ABNORMAL LOW (ref 36.0–46.0)
Hemoglobin: 6.5 g/dL — CL (ref 12.0–15.0)
Immature Granulocytes: 0 %
Lymphocytes Relative: 30 %
Lymphs Abs: 2.1 10*3/uL (ref 0.7–4.0)
MCH: 15.4 pg — ABNORMAL LOW (ref 26.0–34.0)
MCHC: 24.3 g/dL — ABNORMAL LOW (ref 30.0–36.0)
MCV: 63.4 fL — ABNORMAL LOW (ref 80.0–100.0)
Monocytes Absolute: 0.5 10*3/uL (ref 0.1–1.0)
Monocytes Relative: 7 %
Neutro Abs: 4.1 10*3/uL (ref 1.7–7.7)
Neutrophils Relative %: 59 %
Platelets: 607 10*3/uL — ABNORMAL HIGH (ref 150–400)
RBC: 4.23 MIL/uL (ref 3.87–5.11)
RDW: 23.9 % — ABNORMAL HIGH (ref 11.5–15.5)
WBC: 6.8 10*3/uL (ref 4.0–10.5)
nRBC: 0 % (ref 0.0–0.2)

## 2020-07-10 LAB — SARS CORONAVIRUS 2 BY RT PCR (HOSPITAL ORDER, PERFORMED IN ~~LOC~~ HOSPITAL LAB): SARS Coronavirus 2: NEGATIVE

## 2020-07-10 LAB — TROPONIN I (HIGH SENSITIVITY)
Troponin I (High Sensitivity): 4 ng/L (ref <18)
Troponin I (High Sensitivity): 5 ng/L (ref <18)

## 2020-07-10 LAB — PREPARE RBC (CROSSMATCH)

## 2020-07-10 LAB — CBG MONITORING, ED: Glucose-Capillary: 156 mg/dL — ABNORMAL HIGH (ref 70–99)

## 2020-07-10 LAB — PROTIME-INR
INR: 1.1 (ref 0.8–1.2)
Prothrombin Time: 13.9 seconds (ref 11.4–15.2)

## 2020-07-10 LAB — BRAIN NATRIURETIC PEPTIDE: B Natriuretic Peptide: 23.2 pg/mL (ref 0.0–100.0)

## 2020-07-10 LAB — LACTIC ACID, PLASMA: Lactic Acid, Venous: 1.8 mmol/L (ref 0.5–1.9)

## 2020-07-10 LAB — ABO/RH: ABO/RH(D): O POS

## 2020-07-10 LAB — POC OCCULT BLOOD, ED: Fecal Occult Bld: NEGATIVE

## 2020-07-10 MED ORDER — AMLODIPINE BESYLATE 10 MG PO TABS
10.0000 mg | ORAL_TABLET | Freq: Every day | ORAL | Status: DC
  Administered 2020-07-11 – 2020-07-14 (×5): 10 mg via ORAL
  Filled 2020-07-10 (×5): qty 1

## 2020-07-10 MED ORDER — HEPARIN BOLUS VIA INFUSION
2000.0000 [IU] | Freq: Once | INTRAVENOUS | Status: AC
  Administered 2020-07-10: 2000 [IU] via INTRAVENOUS
  Filled 2020-07-10: qty 2000

## 2020-07-10 MED ORDER — ONDANSETRON HCL 4 MG/2ML IJ SOLN: 4.0000 mg | Freq: Four times a day (QID) | INTRAMUSCULAR | Status: DC | PRN

## 2020-07-10 MED ORDER — SENNOSIDES-DOCUSATE SODIUM 8.6-50 MG PO TABS
1.0000 | ORAL_TABLET | Freq: Every evening | ORAL | Status: DC | PRN
  Administered 2020-07-20: 1 via ORAL
  Filled 2020-07-10: qty 1

## 2020-07-10 MED ORDER — LACTATED RINGERS IV SOLN: INTRAVENOUS | Status: DC

## 2020-07-10 MED ORDER — HYDROCODONE-ACETAMINOPHEN 10-325 MG PO TABS
0.5000 | ORAL_TABLET | Freq: Four times a day (QID) | ORAL | Status: DC | PRN
  Administered 2020-07-11 – 2020-07-26 (×32): 0.5 via ORAL
  Filled 2020-07-10 (×35): qty 1

## 2020-07-10 MED ORDER — ALBUTEROL SULFATE HFA 108 (90 BASE) MCG/ACT IN AERS
2.0000 | INHALATION_SPRAY | RESPIRATORY_TRACT | Status: DC | PRN
  Administered 2020-07-11 – 2020-07-20 (×2): 2 via RESPIRATORY_TRACT
  Filled 2020-07-10: qty 6.7

## 2020-07-10 MED ORDER — LEVOTHYROXINE SODIUM 25 MCG PO TABS
137.0000 ug | ORAL_TABLET | Freq: Every day | ORAL | Status: DC
  Administered 2020-07-11 – 2020-07-27 (×17): 137 ug via ORAL
  Filled 2020-07-10 (×17): qty 1

## 2020-07-10 MED ORDER — HEPARIN (PORCINE) 25000 UT/250ML-% IV SOLN
1200.0000 [IU]/h | INTRAVENOUS | Status: DC
  Administered 2020-07-10: 1200 [IU]/h via INTRAVENOUS
  Filled 2020-07-10: qty 250

## 2020-07-10 MED ORDER — ACETAMINOPHEN 650 MG RE SUPP: 650.0000 mg | Freq: Four times a day (QID) | RECTAL | Status: DC | PRN

## 2020-07-10 MED ORDER — ACETAMINOPHEN 325 MG PO TABS
650.0000 mg | ORAL_TABLET | Freq: Four times a day (QID) | ORAL | Status: DC | PRN
  Administered 2020-07-17 – 2020-07-25 (×5): 650 mg via ORAL
  Filled 2020-07-10 (×5): qty 2

## 2020-07-10 MED ORDER — SERTRALINE HCL 100 MG PO TABS
100.0000 mg | ORAL_TABLET | Freq: Every day | ORAL | Status: DC
  Administered 2020-07-11 – 2020-07-26 (×17): 100 mg via ORAL
  Filled 2020-07-10 (×17): qty 1

## 2020-07-10 MED ORDER — LOSARTAN POTASSIUM 50 MG PO TABS
100.0000 mg | ORAL_TABLET | Freq: Every day | ORAL | Status: DC
  Administered 2020-07-11 – 2020-07-26 (×17): 100 mg via ORAL
  Filled 2020-07-10 (×19): qty 2

## 2020-07-10 MED ORDER — IOHEXOL 300 MG/ML  SOLN
75.0000 mL | Freq: Once | INTRAMUSCULAR | Status: AC | PRN
  Administered 2020-07-10: 75 mL via INTRAVENOUS

## 2020-07-10 MED ORDER — IPRATROPIUM-ALBUTEROL 0.5-2.5 (3) MG/3ML IN SOLN
3.0000 mL | Freq: Four times a day (QID) | RESPIRATORY_TRACT | Status: DC
  Administered 2020-07-11 (×4): 3 mL via RESPIRATORY_TRACT
  Filled 2020-07-10 (×4): qty 3

## 2020-07-10 MED ORDER — SODIUM CHLORIDE 0.9 % IV SOLN
10.0000 mL/h | Freq: Once | INTRAVENOUS | Status: AC
  Administered 2020-07-10: 10 mL/h via INTRAVENOUS

## 2020-07-10 MED ORDER — ONDANSETRON HCL 4 MG PO TABS: 4.0000 mg | ORAL_TABLET | Freq: Four times a day (QID) | ORAL | Status: DC | PRN

## 2020-07-10 NOTE — ED Provider Notes (Signed)
[facility] EMERGENCY DEPARTMENT Provider Note   CSN: [id number] Arrival date & time: 07/10/20  1716     History Chief Complaint  Patient presents with  . Chest Pain  . COPD    Jaime Kelley is a 69 y.o. female with a past medical history of COPD on 5 L of oxygen via nasal cannula at baseline, prior PE, rheumatoid arthritis, currently on Coumadin presenting to the ED with chest pain.  Patient reports chest pain since today.  EMS states that she did have some signs of respiratory distress so she was given Solu-Medrol and DuoNeb and EMS with some improvement.  Patient denying any abdominal pain, injuries or falls.  EMS states that she resides at home with a partner but apparently has not left her bed for the past year.  Remainder of history is limited as patient unwilling to participate.  HPI     Past Medical History:  Diagnosis Date  . COPD (chronic obstructive pulmonary disease) ([place])   . Discoid lupus    sees Dr. Allyson Sabal  . GERD (gastroesophageal reflux disease)   . History of colonic polyps   . Hx pulmonary embolism   . Hypothyroidism   . Rheumatoid arthritis(714.0)    sees Dr. Dossie Der   . Thyroid disease     Patient Active Problem List   Diagnosis Date Noted  . Chronic pain of both knees 02/25/2019  . Upper airway cough syndrome 12/22/2017  . Acute hypoxemic respiratory failure ([place]) 01/18/2017  . Type 2 diabetes mellitus ([place]) 05/25/2016  . Encounter for therapeutic drug monitoring 11/20/2013  . Anxiety state, unspecified 09/16/2013  . Long term (current) use of anticoagulants 09/11/2013  . Anticoagulated on Coumadin 06/28/2013  . Depression 05/26/2013  . Hx pulmonary embolism 05/19/2013  . Tobacco abuse 05/19/2013  . Essential hypertension 05/13/2013  . Embolism and thrombosis of deep vessels of proximal lower extremity ([place]) 11/05/2010  . BACK STRAIN, LUMBAR 07/09/2009  . Rheumatoid arthritis ([place]) 02/10/2009  . THUMB SPRAIN 06/26/2008  .  COPD GOLD III 10/31/2007  . BRONCHITIS, ACUTE 06/17/2007  . EMBOLISM/THROMBOSIS, DEEP VSL PRXML LWR EXTRM 05/31/2007  . Hypothyroidism 04/15/2007  . EMPHYSEMA 04/15/2007  . GERD 04/15/2007  . LUPUS ERYTHEMATOSUS, DISCOID 04/15/2007  . PULMONARY EMBOLISM, HX OF 04/15/2007  . Personal history of colonic polyps 04/15/2007  . DIVERTICULITIS, HX OF 04/15/2007    Past Surgical History:  Procedure Laterality Date  . ABDOMINAL HYSTERECTOMY    . arthroscopy right knee    . BREAST EXCISIONAL BIOPSY Right 2011  . carpal tunnel release left wrist    . 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 TONSILLECTOMY       OB History   No obstetric history on file.     Family History  Problem Relation Age of Onset  . Arthritis Other   . Coronary artery disease Other   . Hypertension Other   . Stroke Other   . Cancer Other        lung  . Lymphoma Son     Social History   Tobacco Use  . Smoking status: Former Smoker    Packs/day: 1.00    Years: 40.00    Pack years: 40.00    Types: Cigarettes    Quit date: 01/18/2017    Years since quitting: 3.4  . Smokeless tobacco: Never Used  Substance Use Topics  . Alcohol use: No    Alcohol/week: 0.0 standard drinks  . Drug use: No  Home Medications Prior to Admission medications   Medication Sig Start Date End Date Taking? Authorizing Provider  albuterol (PROAIR HFA) 108 (90 Base) MCG/ACT inhaler INHALE 2 PUFFS INTO THE LUNGS EVERY 4 (FOUR) HOURS AS NEEDED FOR WHEEZING. Patient taking differently: Inhale 2 puffs into the lungs every 4 (four) hours as needed for wheezing or shortness of breath.  01/02/20  Yes Laurey Morale, MD  amLODipine (NORVASC) 10 MG tablet TAKE 1 TABLET BY MOUTH EVERY DAY Patient taking differently: Take 10 mg by mouth at bedtime.  04/23/20  Yes Laurey Morale, MD  diphenhydrAMINE (BENADRYL ALLERGY) 25 MG tablet Take 25 mg by mouth every 6 (six) hours.    Yes [provider]  HYDROcodone-acetaminophen  (NORCO) 10-325 MG tablet Take 1 tablet by mouth every 6 (six) hours as needed for moderate pain. Patient taking differently: Take 0.5 tablets by mouth 4 (four) times daily as needed (pain).  07/05/20 08/04/20 Yes Laurey Morale, MD  ipratropium-albuterol (DUONEB) 0.5-2.5 (3) MG/3ML SOLN TAKE 3 MLS BY NEBULIZATION EVERY 4 (FOUR) HOURS AS NEEDED. Patient taking differently: Take 3 mLs by nebulization 4 (four) times daily as needed (shortness of breath/wheezing).  08/27/19  Yes Laurey Morale, MD  losartan (COZAAR) 100 MG tablet TAKE 1 TABLET BY MOUTH EVERY DAY Patient taking differently: Take 100 mg by mouth at bedtime.  07/02/20  Yes Laurey Morale, MD  metFORMIN (GLUCOPHAGE) 500 MG tablet TAKE 1 TABLET BY MOUTH TWICE A DAY Patient taking differently: Take 500 mg by mouth 2 (two) times daily with a meal.  01/27/20  Yes Laurey Morale, MD  OXYGEN Inhale 5 L into the lungs. continuous   Yes [provider]  sertraline (ZOLOFT) 100 MG tablet TAKE 1 TABLET BY MOUTH EVERY DAY Patient taking differently: Take 100 mg by mouth at bedtime.  04/10/19  Yes Laurey Morale, MD  SYNTHROID 137 MCG tablet TAKE 1 TABLET BY MOUTH EVERY DAY BEFORE BREAKFAST Patient taking differently: Take 137 mcg by mouth daily before breakfast.  04/23/20  Yes Laurey Morale, MD  warfarin (COUMADIN) 7.5 MG tablet Take 1 tablet daily except take 1/2 tablet on Wed or [place] Patient taking differently: Take 3.75-7.5 mg by mouth See admin instructions. Take 1/2 tablet (3.75 mg) by mouth on Wednesday evening, take 1 tablet (7.5 mg) on all other evenings of the week 01/13/20  Yes Laurey Morale, MD  chlordiazePOXIDE (LIBRIUM) 5 MG capsule Take 1 capsule (5 mg total) by mouth 3 (three) times daily as needed for anxiety. Patient not taking: Reported on 07/10/2020 06/25/20   Laurey Morale, MD    Allergies    Effexor [venlafaxine hydrochloride]  Review of Systems   Review of Systems  Unable to perform ROS:  Patient nonverbal  Cardiovascular: Positive for chest pain.  Gastrointestinal: Negative for abdominal pain, nausea and vomiting.    Physical Exam Updated Vital Signs BP (!) 106/45 (BP Location: Right Arm)   Pulse 87   Temp 98.3 F (36.8 C) (Rectal)   Resp (!) 22   SpO2 100%   Physical Exam Vitals and nursing note reviewed. Exam conducted with a chaperone present.  Constitutional:      General: She is not in acute distress.    Appearance: She is well-developed.  HENT:     Head: Normocephalic and atraumatic.     Nose: Nose normal.  Eyes:     General: No scleral icterus.  Left eye: No discharge.     Conjunctiva/sclera: Conjunctivae normal.  Cardiovascular:     Rate and Rhythm: Normal rate and regular rhythm.     Heart sounds: Normal heart sounds. No murmur heard.  No friction rub. No gallop.   Pulmonary:     Effort: Pulmonary effort is normal. Tachypnea present. No respiratory distress.     Breath sounds: Normal breath sounds.  Abdominal:     General: Bowel sounds are normal. There is no distension.     Palpations: Abdomen is soft.     Tenderness: There is no abdominal tenderness. There is no guarding.  Genitourinary:    Rectum: Normal anal tone.     Comments: Brown stool that was disimpacted manually. Musculoskeletal:        General: Normal range of motion.     Cervical back: Normal range of motion and neck supple.     Right lower leg: No edema.     Left lower leg: No edema.     Comments: 2+ DP pulses noted bilaterally.  Edema to bilateral knees and thigh.  Skin:    General: Skin is warm and dry.     Findings: No rash.  Neurological:     Mental Status: She is alert.     Motor: No abnormal muscle tone.     Coordination: Coordination normal.     ED Results / Procedures / Treatments   Labs (all labs ordered are listed, but only abnormal results are displayed) Labs Reviewed  COMPREHENSIVE METABOLIC PANEL - Abnormal; Notable for the following components:       Result Value   Sodium 132 (*)    Potassium 3.3 (*)    Chloride 90 (*)    Glucose, Bld 168 (*)    BUN <5 (*)    Calcium 8.6 (*)    Albumin 2.7 (*)    AST 13 (*)    All other components within normal limits  CBC WITH DIFFERENTIAL/PLATELET - Abnormal; Notable for the following components:   Hemoglobin 6.5 (*)    HCT 26.8 (*)    MCV 63.4 (*)    MCH 15.4 (*)    MCHC 24.3 (*)    RDW 23.9 (*)    Platelets 607 (*)    All other components within normal limits  CBG MONITORING, ED - Abnormal; Notable for the following components:   Glucose-Capillary 156 (*)    All other components within normal limits  SARS CORONAVIRUS 2 BY RT PCR (HOSPITAL ORDER, [facility])  CULTURE, BLOOD (ROUTINE X 2)  CULTURE, BLOOD (ROUTINE X 2)  URINE CULTURE  BRAIN NATRIURETIC PEPTIDE  LACTIC ACID, PLASMA  PROTIME-INR  URINALYSIS, ROUTINE W REFLEX MICROSCOPIC  POC OCCULT BLOOD, ED  POC OCCULT BLOOD, ED  I-STAT ARTERIAL BLOOD GAS, ED  TYPE AND SCREEN  ABO/RH  PREPARE RBC (CROSSMATCH)  TROPONIN I (HIGH SENSITIVITY)  TROPONIN I (HIGH SENSITIVITY)    EKG None  Radiology CT Head Wo Contrast  Result Date: 07/10/2020 CLINICAL DATA:  Delirium EXAM: CT HEAD WITHOUT CONTRAST TECHNIQUE: Contiguous axial images were obtained from the base of the skull through the vertex without intravenous contrast. COMPARISON:  None. FINDINGS: Brain: Normal anatomic configuration. Parenchymal volume loss is advanced in relation to the patient's age. Moderate periventricular white matter changes are present likely reflecting the sequela of small vessel ischemia. Remote left pontine infarct. No abnormal intra or extra-axial mass lesion or fluid collection. No abnormal mass effect or midline shift. No  evidence of acute intracranial hemorrhage or infarct. Borderline ventriculomegaly is commensurate with the degree of cerebral atrophy and likely reflects central atrophy. Cerebellum unremarkable. Vascular: No  asymmetric hyperdense vasculature at the skull base. Skull: Intact Sinuses/Orbits: Moderate mucosal thickening and air-fluid level noted within the a sphenoid sinus. Osteoma noted within the left frontal sinus. Remaining paranasal sinuses are clear. Orbits are unremarkable. Other: There is near complete fluid opacification of the mastoid air cells and middle ear cavities bilaterally. No definite superimposed osseous erosion. IMPRESSION: No evidence of acute intracranial hemorrhage or infarct. Relatively advanced senescent changes.  Remote left pontine infarct. Extensive fluid opacification of the mastoid air cells and middle ear cavities. Correlation for otomastoiditis is recommended. Sphenoid sinusitis. Electronically Signed   By: Fidela Salisbury MD   On: 07/10/2020 19:35   CT Chest W Contrast  Result Date: 07/10/2020 CLINICAL DATA:  Pleural effusion EXAM: CT CHEST WITH CONTRAST TECHNIQUE: Multidetector CT imaging of the chest was performed during intravenous contrast administration. CONTRAST:  58mL OMNIPAQUE IOHEXOL 300 MG/ML  SOLN COMPARISON:  None. FINDINGS: Cardiovascular: There is extensive coronary artery calcification. Global cardiac size within normal limits. Left ventricular hypertrophy noted. Moderate calcification of the mitral valve annulus. No pericardial effusion. The central pulmonary arteries are of normal caliber. The thoracic aorta is of normal caliber. There is extensive atherosclerotic calcifications seen within the arch vasculature proximally, particularly the left subclavian artery, however, the degree of stenosis is not well assessed on this non arteriographic study. Scattered mild atherosclerotic calcifications seen within the descending thoracic aorta., Mediastinum/Nodes: No pathologic adenopathy. Thyroid unremarkable. Esophagus unremarkable. Lungs/Pleura: A large left pleural effusion is present with complete opacification of the left hemithorax and complete collapse of the left lung.  There is debris seen within the left mainstem bronchus. Moderate emphysema involving the aerated right lung. No superimposed focal pulmonary infiltrate or nodule. No pneumothorax or pleural effusion on the right. Upper Abdomen: No acute abnormality. Musculoskeletal: Osseous structures are age-appropriate. No acute bone abnormality. IMPRESSION: Large left pleural effusion with complete collapse of the left lung. Moderate debris within the left mainstem bronchus. Extensive coronary artery calcification. Peripheral vascular disease with extensive calcification of the origin of the left subclavian artery. If there is suggestion of hemodynamically significant stenosis (subclavian steal, left upper extremity claudication), this would be better assessed with CT arteriography. Aortic Atherosclerosis (ICD10-I70.0) and Emphysema (ICD10-J43.9). Electronically Signed   By: Fidela Salisbury MD   On: 07/10/2020 19:45   DG Chest Portable 1 View  Result Date: 07/10/2020 CLINICAL DATA:  Chest pain, shortness of breath, COPD EXAM: PORTABLE CHEST 1 VIEW COMPARISON:  01/18/2017 FINDINGS: There is total opacification of the left hemithorax, which is incompletely imaged due to angulation of radiograph and exclusion inferiorly. There is significant volume loss with leftward shift of the trachea and mediastinal contents. The right lung is normally aerated. IMPRESSION: There is total opacification of the left hemithorax, which is incompletely imaged due to angulation of radiograph and exclusion inferiorly. There is significant volume loss with leftward shift of the trachea and mediastinal contents. Findings are consistent with a large pleural effusion and total atelectasis or consolidation of the left lung, and generally concerning for malignancy. Recommend CT to further evaluate. Electronically Signed   By: Eddie Candle M.D.   On: 07/10/2020 18:25    Procedures Fecal disimpaction  Date/Time: 07/10/2020 7:42 PM Performed by: Delia Heady, PA-C Authorized by: Delia Heady, PA-C  Local anesthesia used: no  Anesthesia: Local anesthesia used: no  Sedation:  Patient sedated: no  Patient tolerance: patient tolerated the procedure well with no immediate complications Comments: Successfully removed stool.  .Critical Care Performed by: Delia Heady, PA-C Authorized by: Delia Heady, PA-C   Critical care provider statement:    Critical care time (minutes):  45   Critical care was necessary to treat or prevent imminent or life-threatening deterioration of the following conditions:  Circulatory failure, respiratory failure and cardiac failure   Critical care was time spent personally by me on the following activities:  Development of treatment plan with patient or surrogate, discussions with consultants, evaluation of patient's response to treatment, examination of patient, ordering and performing treatments and interventions, ordering and review of laboratory studies, ordering and review of radiographic studies, pulse oximetry, obtaining history from patient or surrogate, re-evaluation of patient's condition and review of old charts   I assumed direction of critical care for this patient from another provider in my specialty: no     (including critical care time)  Medications Ordered in ED Medications  0.9 %  sodium chloride infusion (has no administration in time range)  iohexol (OMNIPAQUE) 300 MG/ML solution 75 mL (75 mLs Intravenous Contrast Given 07/10/20 1922)    ED Course  I have reviewed the triage vital signs and the nursing notes.  Pertinent labs & imaging results that were available during my care of the patient were reviewed by me and considered in my medical decision making (see chart for details).  Clinical Course as of Jul 10 2105  Sat Jul 10, 2020  1801 Attempted to call emergency contact her son without reply.   [HK]  1856 Inform nurse that patient will need a rectal temp and stool sample for hemoccult  testing.   [HK]  1857 Hemoglobin(!!): 6.5 [HK]  1908 Hemoccult negative.   [HK]  2037 SARS Coronavirus 2: NEGATIVE [HK]    Clinical Course User Index [HK] Delia Heady, PA-C   MDM Rules/Calculators/A&P                          69 year old female with a past medical history of COPD on 5 L of oxygen via nasal cannula at baseline, prior PE on Coumadin, presenting to the ED with a chief complaint of chest pain.  Unsure how long her chest pain has been going on as patient only answers to yes or no questions.  She appears uncomfortable with laying down flat.  She is afebrile here.  She is tachypneic.  She is still on 5 L of oxygen via facemask.  She does have some edema of the bilateral upper legs above the knee.  Stool appears brown on rectal exam.  She has a hemoglobin of 6.5 of unknown chronicity.  She denies history of GI bleed.  Her INR is 1.1, appears she has not been taking her Coumadin.  Chest x-ray shows left-sided pleural effusion with collapse of the left lung.  No evidence of pneumonia noted on chest x-ray.  She has no leukocytosis.  CT of the head is negative for acute abnormality.  Covid test is negative.  Initial and delta troponin are both negative.  EKG shows sinus rhythm, nonspecific T wave changes.  Spoke to hospitalist who will admit the patient.  I also spoke to pulmonology, Dr. Madalyn Rob.  They will see the patient in consult.  She will require drainage of this pleural effusion and further evaluation by pulmonology to determine etiology of this pleural effusion.  They will be contacted  overnight if there are any changes to her hemodynamic status. I have informed hospitalist about pulmonology consult.  If she needs to be anticoagulated, instead of Coumadin will need to be on heparin which will need to be stopped prior to attempting drainage. Appreciate the help of consult and hospitalist for management of this patient. Patient discussed with and seen by the attending, Dr. Jeanell Sparrow.  All  imaging, if done today, including plain films, CT scans, and ultrasounds, independently reviewed by me, and interpretations confirmed via formal radiology reads.  Portions of this note were generated with [employer]. Dictation errors may occur despite best attempts at proofreading.  Final Clinical Impression(s) / ED Diagnoses Final diagnoses:  Fecal impaction ([place])  Symptomatic anemia  Pleural effusion, left    Rx / DC Orders ED Discharge Orders    None       Delia Heady, PA-C 07/10/20 2136    Pattricia Boss, MD 07/13/20 1330

## 2020-07-10 NOTE — H&P (Signed)
History and Physical    Jaime Kelley GGE:[id number] DOB: 08-09-1951 DOA: 07/10/2020  PCP: Laurey Morale, MD   Patient coming from:  Home  Chief Complaint:   Chest pain, shortness of breath  HPI: Jaime Kelley is a 69 y.o. 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 She is normally on oxygen by nasal cannula at 5 L/min at home.  She reports that this morning she had increased shortness of breath and then developed a substernal chest pressure.  She reports the pressure is underneath her sternum and does not radiate.  She reports that was a 6 out of 10 at its worst.  As the day progressed she had increasing shortness of breath.  She reports that she has increased shortness of breath with talking or shifting position in bed.  She has been too weak to get out of bed and has not tried to ambulate.  She did not have any alleviating  factors for the chest pain or shortness of breath.  She reports she has had generalized weakness the last month and has not been up and out of bed.  She has a caregiver that helps her at home.  She reports she has not had any fevers or chills.  She has a chronic cough that is nonproductive and is unchanged.  She denies any abdominal pain, cramping, nausea, vomiting, diarrhea, black tarry stool or blood in her stool.  She denies any urinary frequency or dysuria.  She has not had any rash.  She has not had any rash.  She denies any swelling of her legs.  She states she has been taking her medications at home including her Coumadin.  She is not sure when she had her last INR checked.  Denies any known sick contacts or exposures to COVID-19. He has a history of smoking but quit in 2018.  She denies alcohol or illicit drug use  ED Course:   Emergency room patient is found to be  anemic with a hemoglobin of 6.8.  Last hemoglobin was over 13 on lab 3 years ago in her chart.  There are no other labs to compare to.  INR is subtherapeutic at 1.1 on testing today. Found to have a large left-sided pleural effusion which is causing complete collapse of her left lung.  She is now requiring a nonrebreather to maintain O2 sat.  Patient was Hemoccult negative with brown stool on rectal exam in the emergency room by the ER provider.  ER provider discussed case with pulmonology stated they will see patient in the morning for thoracentesis if her condition decompensates overnight to call them back and they will evaluate patient emergently at that time and perform thoracentesis if required overnight.   Review of Systems:  General: Reports generalized weakness. Denies fever, chills, weight loss, night sweats.  Denies dizziness.  Denies change in appetite HENT: Denies head trauma, headache, denies change in hearing, tinnitus.  Denies nasal congestion or bleeding.  Denies sore throat, sores in mouth.  Denies difficulty swallowing Eyes: Denies blurry vision, pain in eye, drainage.  Denies discoloration of eyes. Neck: Denies pain.  Denies swelling.  Denies pain with movement. Cardiovascular: Reports substernal chest pain. Denies palpitations.  Denies edema.  Reports orthopnea today Respiratory: Reports shortness of breath with chronic dry cough.  Denies wheezing.  Denies sputum production Gastrointestinal: Denies abdominal pain, swelling.  Denies nausea, vomiting, diarrhea.  Denies melena.  Denies hematemesis. Musculoskeletal: Denies limitation of movement.  Denies deformity or swelling.  Denies pain.  Denies arthralgias or myalgias. Genitourinary: Denies pelvic pain.  Denies urinary frequency or hesitancy.  Denies dysuria.  Skin: Denies rash.  Denies petechiae, purpura, ecchymosis. Neurological: Denies headache.  Denies syncope.  Denies seizure activity.  Denies weakness or paresthesia.  Denies  slurred speech, drooping face.  Denies visual change. Psychiatric: Denies depression, anxiety.  Denies suicidal thoughts or ideation.  Denies hallucinations.  Past Medical History:  Diagnosis Date  . Anxiety   . COPD (chronic obstructive pulmonary disease) ([place])   . Depression   . Diabetes mellitus without complication ([place])   . Discoid lupus    sees Dr. Allyson Sabal  . Dyspnea   . GERD (gastroesophageal reflux disease)   . History of colonic polyps   . Hx pulmonary embolism   . Hypertension   . Hypothyroidism   . Rheumatoid arthritis(714.0)    sees Dr. Dossie Der   . Thyroid disease     Past Surgical History:  Procedure Laterality Date  . ABDOMINAL HYSTERECTOMY    . arthroscopy right knee    . BREAST EXCISIONAL BIOPSY Right 2011  . carpal tunnel release left wrist    . 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 TONSILLECTOMY      Social History  reports that she quit smoking about 3 years ago. Her smoking use included cigarettes. She has a 40.00 pack-year smoking history. She has never used smokeless tobacco. She reports that she does not drink alcohol and does not use drugs.  Allergies  Allergen Reactions  . Effexor [Venlafaxine Hydrochloride] Rash    Family History  Problem Relation Age of Onset  . Arthritis Other   . Coronary artery disease Other   . Hypertension Other   . Stroke Other   . Cancer Other        lung  . Lymphoma Son      Prior to Admission medications   Medication Sig Start Date End Date Taking? Authorizing Provider  albuterol (PROAIR HFA) 108 (90 Base) MCG/ACT inhaler INHALE 2 PUFFS INTO THE LUNGS EVERY 4 (FOUR) HOURS AS NEEDED FOR WHEEZING. Patient taking differently: Inhale 2 puffs into the lungs every 4 (four) hours as needed for wheezing or shortness of breath.  01/02/20  Yes Laurey Morale, MD  amLODipine (NORVASC) 10 MG tablet TAKE 1 TABLET BY MOUTH EVERY DAY Patient taking differently: Take 10 mg by mouth at bedtime.  04/23/20  Yes Laurey Morale, MD  diphenhydrAMINE (BENADRYL ALLERGY) 25 MG tablet Take 25 mg by mouth every 6 (six) hours.    Yes [provider]  HYDROcodone-acetaminophen (NORCO) 10-325 MG tablet Take 1 tablet by mouth every 6 (six) hours as needed for moderate pain. Patient taking differently: Take 0.5 tablets by mouth 4 (four) times daily as needed (pain).  07/05/20 08/04/20 Yes Laurey Morale, MD  ipratropium-albuterol (DUONEB) 0.5-2.5 (3) MG/3ML SOLN TAKE 3 MLS BY NEBULIZATION EVERY 4 (FOUR) HOURS AS NEEDED. Patient taking differently: Take 3 mLs by nebulization 4 (four) times daily as needed (shortness of breath/wheezing).  08/27/19  Yes Laurey Morale, MD  losartan (COZAAR) 100 MG tablet TAKE 1 TABLET BY MOUTH EVERY DAY Patient taking differently: Take 100 mg by mouth at bedtime.  07/02/20  Yes Laurey Morale, MD  metFORMIN (GLUCOPHAGE) 500 MG tablet TAKE 1  TABLET BY MOUTH TWICE A DAY Patient taking differently: Take 500 mg by mouth 2 (two) times daily with a meal.  01/27/20  Yes Laurey Morale, MD  OXYGEN Inhale 5 L into the lungs. continuous   Yes [provider]  sertraline (ZOLOFT) 100 MG tablet TAKE 1 TABLET BY MOUTH EVERY DAY Patient taking differently: Take 100 mg by mouth at bedtime.  04/10/19  Yes Laurey Morale, MD  SYNTHROID 137 MCG tablet TAKE 1 TABLET BY MOUTH EVERY DAY BEFORE BREAKFAST Patient taking differently: Take 137 mcg by mouth daily before breakfast.  04/23/20  Yes Laurey Morale, MD  warfarin (COUMADIN) 7.5 MG tablet Take 1 tablet daily except take 1/2 tablet on Wed or [place] Patient taking differently: Take 3.75-7.5 mg by mouth See admin instructions. Take 1/2 tablet (3.75 mg) by mouth on Wednesday evening, take 1 tablet (7.5 mg) on all other evenings of the week 01/13/20  Yes Laurey Morale, MD  chlordiazePOXIDE (LIBRIUM) 5 MG capsule Take 1 capsule (5 mg total) by mouth 3 (three) times daily as needed for anxiety. Patient not taking: Reported on  07/10/2020 06/25/20   Laurey Morale, MD    Physical Exam: Vitals:   07/10/20 1942 07/10/20 2100 07/10/20 2119 07/10/20 2122  BP: (!) 106/45 119/65  (!) 110/56  Pulse: 87 85  83  Resp: (!) 22 19  17   Temp: 98.3 F (36.8 C)   97.6 F (36.4 C)  TempSrc: Rectal   Oral  SpO2: 100% 99%  100%  Weight:   97.5 kg   Height:   5\' 2"  (1.575 m)     Constitutional: NAD, calm, comfortable Vitals:   07/10/20 1942 07/10/20 2100 07/10/20 2119 07/10/20 2122  BP: (!) 106/45 119/65  (!) 110/56  Pulse: 87 85  83  Resp: (!) 22 19  17   Temp: 98.3 F (36.8 C)   97.6 F (36.4 C)  TempSrc: Rectal   Oral  SpO2: 100% 99%  100%  Weight:   97.5 kg   Height:   5\' 2"  (1.575 m)    General: WDWN chronically ill appearing female who appears older than stated age, Alert and oriented x3.  Eyes: EOMI, PERRL, lids normal. Conjunctivae pale.  Sclera nonicteric. No discharge HENT:  [place]/AT, external ears normal. Nares patent without epistasis.  Mucous membranes are pale. Posterior pharynx clear of any exudate or lesions Neck: Soft, normal range of motion, supple, no masses, no thyromegaly.  Trachea midline Respiratory:  No breath sounds appreciated on left side. Right side with air movement and diffuse rales. no wheezing, no crackles. Normal respiratory effort. No accessory muscle use.  Cardiovascular: Regular rate and rhythm, no murmurs / rubs / gallops. No extremity edema. 1+ pedal pulses.  Abdomen: Soft, no tenderness, nondistended, no rebound or guarding. Obese. No masses palpated. Bowel sounds normoactive Musculoskeletal: FROM. Has clubbing of digits. No cyanosis. No joint deformity upper and lower extremities.   Skin: Warm, dry, intact no rashes, lesions, ulcers. No induration Neurologic: CN 2-12 grossly intact.  Normal speech.  Sensation intact, patella DTR +1 bilaterally. Strength 3/5 in all extremities.   Psychiatric: Normal judgment and insight.  Normal mood.    Labs on Admission: I have personally reviewed  following labs and imaging studies  CBC: Recent Labs  Lab 07/10/20 1759  WBC 6.8  NEUTROABS 4.1  HGB 6.5*  HCT 26.8*  MCV 63.4*  PLT 607*    Basic Metabolic Panel: Recent Labs  Lab 07/10/20 1759  NA 132*  K 3.3*  CL 90*  CO2 30  GLUCOSE 168*  BUN <5*  CREATININE 0.52  CALCIUM 8.6*    GFR: Estimated Creatinine Clearance: 72.4 mL/min (by C-G formula based on SCr of 0.52 mg/dL).  Liver Function Tests: Recent Labs  Lab 07/10/20 1759  AST 13*  ALT 8  ALKPHOS 56  BILITOT 0.4  PROT 6.6  ALBUMIN 2.7*    Urine analysis:    Component Value Date/Time   COLORURINE YELLOW 01/18/2017 1451   APPEARANCEUR CLEAR 01/18/2017 1451   LABSPEC 1.008 01/18/2017 1451   PHURINE 6.0 01/18/2017 1451   GLUCOSEU NEGATIVE 01/18/2017 1451   HGBUR SMALL (A) 01/18/2017 1451   BILIRUBINUR NEGATIVE 01/18/2017 1451   BILIRUBINUR n 02/03/2015 1507   KETONESUR NEGATIVE 01/18/2017 1451   PROTEINUR NEGATIVE 01/18/2017 1451   UROBILINOGEN 0.2 02/03/2015 1507   NITRITE NEGATIVE 01/18/2017 1451   LEUKOCYTESUR LARGE (A) 01/18/2017 1451    Radiological Exams on Admission: CT Head Wo Contrast  Result Date: 07/10/2020 CLINICAL DATA:  Delirium EXAM: CT HEAD WITHOUT CONTRAST TECHNIQUE: Contiguous axial images were obtained from the base of the skull through the vertex without intravenous contrast. COMPARISON:  None. FINDINGS: Brain: Normal anatomic configuration. Parenchymal volume loss is advanced in relation to the patient's age. Moderate periventricular white matter changes are present likely reflecting the sequela of small vessel ischemia. Remote left pontine infarct. No abnormal intra or extra-axial mass lesion or fluid collection. No abnormal mass effect or midline shift. No evidence of acute intracranial hemorrhage or infarct. Borderline ventriculomegaly is commensurate with the degree of cerebral atrophy and likely reflects central atrophy. Cerebellum unremarkable. Vascular: No asymmetric  hyperdense vasculature at the skull base. Skull: Intact Sinuses/Orbits: Moderate mucosal thickening and air-fluid level noted within the a sphenoid sinus. Osteoma noted within the left frontal sinus. Remaining paranasal sinuses are clear. Orbits are unremarkable. Other: There is near complete fluid opacification of the mastoid air cells and middle ear cavities bilaterally. No definite superimposed osseous erosion. IMPRESSION: No evidence of acute intracranial hemorrhage or infarct. Relatively advanced senescent changes.  Remote left pontine infarct. Extensive fluid opacification of the mastoid air cells and middle ear cavities. Correlation for otomastoiditis is recommended. Sphenoid sinusitis. Electronically Signed   By: Fidela Salisbury MD   On: 07/10/2020 19:35   CT Chest W Contrast  Result Date: 07/10/2020 CLINICAL DATA:  Pleural effusion EXAM: CT CHEST WITH CONTRAST TECHNIQUE: Multidetector CT imaging of the chest was performed during intravenous contrast administration. CONTRAST:  28mL OMNIPAQUE IOHEXOL 300 MG/ML  SOLN COMPARISON:  None. FINDINGS: Cardiovascular: There is extensive coronary artery calcification. Global cardiac size within normal limits. Left ventricular hypertrophy noted. Moderate calcification of the mitral valve annulus. No pericardial effusion. The central pulmonary arteries are of normal caliber. The thoracic aorta is of normal caliber. There is extensive atherosclerotic calcifications seen within the arch vasculature proximally, particularly the left subclavian artery, however, the degree of stenosis is not well assessed on this non arteriographic study. Scattered mild atherosclerotic calcifications seen within the descending thoracic aorta., Mediastinum/Nodes: No pathologic adenopathy. Thyroid unremarkable. Esophagus unremarkable. Lungs/Pleura: A large left pleural effusion is present with complete opacification of the left hemithorax and complete collapse of the left lung. There is  debris seen within the left mainstem bronchus. Moderate emphysema involving the aerated right lung. No superimposed focal pulmonary infiltrate or nodule. No pneumothorax or pleural effusion on the right. Upper Abdomen: No acute abnormality. Musculoskeletal: Osseous structures are age-appropriate. No  acute bone abnormality. IMPRESSION: Large left pleural effusion with complete collapse of the left lung. Moderate debris within the left mainstem bronchus. Extensive coronary artery calcification. Peripheral vascular disease with extensive calcification of the origin of the left subclavian artery. If there is suggestion of hemodynamically significant stenosis (subclavian steal, left upper extremity claudication), this would be better assessed with CT arteriography. Aortic Atherosclerosis (ICD10-I70.0) and Emphysema (ICD10-J43.9). Electronically Signed   By: Fidela Salisbury MD   On: 07/10/2020 19:45   DG Chest Portable 1 View  Result Date: 07/10/2020 CLINICAL DATA:  Chest pain, shortness of breath, COPD EXAM: PORTABLE CHEST 1 VIEW COMPARISON:  01/18/2017 FINDINGS: There is total opacification of the left hemithorax, which is incompletely imaged due to angulation of radiograph and exclusion inferiorly. There is significant volume loss with leftward shift of the trachea and mediastinal contents. The right lung is normally aerated. IMPRESSION: There is total opacification of the left hemithorax, which is incompletely imaged due to angulation of radiograph and exclusion inferiorly. There is significant volume loss with leftward shift of the trachea and mediastinal contents. Findings are consistent with a large pleural effusion and total atelectasis or consolidation of the left lung, and generally concerning for malignancy. Recommend CT to further evaluate. Electronically Signed   By: Eddie Candle M.D.   On: 07/10/2020 18:25    EKG: Independently reviewed.  EKG is reviewed and shows normal sinus rhythm with nonspecific  ST changes.  No acute ST elevation or depression.  QTc is 466  Assessment/Plan Principal Problem:   Pleural effusion on left Ms. Carandang is admitted to stepdown unit for close monitoring.  She has a large pleural effusion on the left which is causing pleat collapse of her left lung.  She is having shortness of breath and requiring nonrebreather at this time.  PCCM, Dr. Corinna Lines, has been consulted by the ER provider and will see patient in the morning and plan for thoracentesis.  If patient decompensates during the night will recall PCCM and they will see emergently during the night if it is warranted. Fluid from thoracentesis will be sent to lab for further evaluation to determine if exudative or transudative and if malignant cells present.  Active Problems:   Collapsed lung Patient with dyspnea secondary to collapsed lung resulting from pleural effusion.  PCCM to drain with thoracentesis as above.    COPD GOLD III Patient with history of COPD.  DuoNeb provided every 6 hours.  RT to follow.  Incentive spirometer every 2 hours while awake. Supplemental oxygen provided keep O2 sat between 92-96%    Type 2 diabetes mellitus (HCC) Metformin is held for 48 hours secondary to using contrast for CT scan earlier today. Blood sugars will be monitored with meals and bedtime.  Check hemoglobin A1c SSI as  need for glycemic control    Anemia Patient with hemoglobin of 6.8.  Last hemoglobin in medical record is from 3 years ago was greater than 13.  Denies brown stool and no report of melena.  Patient was Hemoccult negative in the emergency room. Transfuse 1 unit of packed red blood cells. Recheck CBC in morning Further work-up for anemia will need to be undertaken.  We will check iron studies    Chest pain With chest pain that she describes as a substernal chest pressure.  Patient with troponins are negative.  Will check serial troponins.  Continue to monitor on telemetry    Thrombocytosis  (HCC) Platelet count elevated at greater than 600,000.  Patient is  placed on heparin for anticoagulation.  Recheck CBC in morning and if those remain elevated may need hematology consult     Generalized weakness Consult PT for evaluation in am. Pt may need home health vs. SNF placement. States she has been laying in bed for the past month and weak.     Anticoagulated on Coumadin Patient been on Coumadin for a few years she states.  INR subtherapeutic at 1.1.  Is converted to heparin for anticoagulation overnight in anticipation of thoracentesis in the morning    DVT prophylaxis: She is chronically on Coumadin for history of PE and DVTs.  INR subtherapeutic.  Patient replaced on full dose heparin for anticoagulation per pharmacy.  Heparin is used so that it can be turned off 3 to 4 hours prior to thoracentesis with pulmonology in the morning Code Status:   Full code Family Communication:  Diagnosis plan discussed with patient.  Questions answered.  Patient verbalized understanding agrees with plan.  Further agrees to follow as clinical indicated Disposition Plan:   Patient is from:  Home  Anticipated DC to:  Home versus rehab  Anticipated DC date:  Anticipate greater than 2 midnight stay in the hospital to treat acute medical condition  Anticipated DC barriers: DC plan will be made after PT evaluation and work-up determine if patient will need home health versus inpatient                                                                        rehabilitation services  Consults called:  Pulmonology, PCCM consulted by ER provider and PCCM will see in the morning Admission status:  Inpatient  Severity of Illness: The appropriate patient status for this patient is INPATIENT. Inpatient status is judged to be reasonable and necessary in order to provide the required intensity of service to ensure the patient's safety. The patient's presenting symptoms, physical exam findings, and initial radiographic  and laboratory data in the context of their chronic comorbidities is felt to place them at high risk for further clinical deterioration. Furthermore, it is not anticipated that the patient will be medically stable for discharge from the hospital within 2 midnights of admission. The following factors support the patient status of inpatient.    * I certify that at the point of admission it is my clinical judgment that the patient will require inpatient hospital care spanning beyond 2 midnights from the point of admission due to high intensity of service, high risk for further deterioration and high frequency of surveillance required.Yevonne Aline Haziel Molner MD [facility]  How to contact the [facility] Attending or Consulting provider [place] or covering provider during after hours [place], for this patient?   1. Check the care team in [facility] and look for a) attending/consulting [facility] provider listed and b) the [facility] team listed 2. Log into [email or web address] and use [place]'s universal password to access. If you do not have the password, please contact the hospital operator. 3. Locate the [facility] provider you are looking for under [facility] and page to a number that you can be directly reached. 4. If you still have difficulty reaching the provider, please page the [facility] (Director on Call) for the Hospitalists listed  on amion for assistance.  07/10/2020, 9:33 PM

## 2020-07-10 NOTE — ED Notes (Signed)
Patient transported to CT 

## 2020-07-10 NOTE — ED Notes (Signed)
Pam caregiver number if any questions  #  [phone]

## 2020-07-10 NOTE — Progress Notes (Signed)
ANTICOAGULATION CONSULT NOTE - Initial Consult  Pharmacy Consult for heparin Indication: H/o PE/DVT  Allergies  Allergen Reactions  . Effexor [Venlafaxine Hydrochloride] Rash    Patient Measurements: Height: 5\' 2"  (157.5 cm) Weight: 97.5 kg (215 lb) IBW/kg (Calculated) : 50.1 Heparin Dosing Weight: 73.1 kg   Vital Signs: Temp: 97.1 F (36.2 C) (09/18 2143) Temp Source: Axillary (09/18 2143) BP: 99/66 (09/18 2143) Pulse Rate: 81 (09/18 2143)  Labs: Recent Labs    07/10/20 1759 07/10/20 1816 07/10/20 1935 07/10/20 2132  HGB 6.5*  --   --  8.8*  HCT 26.8*  --   --  26.0*  PLT 607*  --   --   --   LABPROT  --  13.9  --   --   INR  --  1.1  --   --   CREATININE 0.52  --   --   --   TROPONINIHS 4  --  5  --     Estimated Creatinine Clearance: 72.4 mL/min (by C-G formula based on SCr of 0.52 mg/dL).   Medical History: Past Medical History:  Diagnosis Date  . Anxiety   . COPD (chronic obstructive pulmonary disease) ([place])   . Depression   . Diabetes mellitus without complication ([place])   . Discoid lupus    sees Dr. Allyson Sabal  . Dyspnea   . GERD (gastroesophageal reflux disease)   . History of colonic polyps   . Hx pulmonary embolism   . Hypertension   . Hypothyroidism   . Rheumatoid arthritis(714.0)    sees Dr. Dossie Der   . Thyroid disease     Medications:  (Not in a hospital admission)   Assessment: 33 YOF with h/o PE on warfarin here with chest pain. INR subtherapeutic at 1.1 on admission.  Pharmacy consulted to transition to heparin drip.   H/H low, Plt high. SCr nl  Goal of Therapy:  Heparin level 0.3-0.7 units/ml Monitor platelets by anticoagulation protocol: Yes   Plan:  -Heparin 2000 units IV bolus followed by heparin infusion at 1200 units/hr  -F/u 6 hr HL -Monitor daily HL, CBC and s/s of bleeding   Albertina Parr, PharmD., BCPS, BCCCP Clinical Pharmacist Clinical phone for 07/10/20 until 10pm: [phone] If after 10pm, please refer to  [facility] for unit-specific pharmacist

## 2020-07-10 NOTE — ED Triage Notes (Signed)
Pt arrived to ED with c/o CP (per EMS), on scene pt was a poor historian & unable to explain what her complaints were with limited speaking. Upon arrival to ED EMS reports to this RN that pt has not been out of her bed in 1 year (per pt's roommate) & EMS reports pt laying in a saturated mattress with urine. Pt has a history of COPD & it took quite a bit of maneuvering to get her into the ambulance that she was out of breath and EMS reports that her breathing was exacerbated. She was given a duo-neb and 125 solu-medrol while in route, 5L via face mask was applied, pt is HOH, pt is verbal but will not respond at every attempt to get her to speak.

## 2020-07-10 NOTE — Progress Notes (Signed)
ABG attempted 2X and did not obtain. RN aware. Will ask oncoming RT to obtain specimen.

## 2020-07-11 ENCOUNTER — Inpatient Hospital Stay (HOSPITAL_COMMUNITY): Payer: Medicare Other

## 2020-07-11 DIAGNOSIS — D473 Essential (hemorrhagic) thrombocythemia: Secondary | ICD-10-CM

## 2020-07-11 DIAGNOSIS — J449 Chronic obstructive pulmonary disease, unspecified: Secondary | ICD-10-CM

## 2020-07-11 DIAGNOSIS — D649 Anemia, unspecified: Secondary | ICD-10-CM

## 2020-07-11 DIAGNOSIS — E1100 Type 2 diabetes mellitus with hyperosmolarity without nonketotic hyperglycemic-hyperosmolar coma (NKHHC): Secondary | ICD-10-CM

## 2020-07-11 DIAGNOSIS — J90 Pleural effusion, not elsewhere classified: Secondary | ICD-10-CM

## 2020-07-11 DIAGNOSIS — R079 Chest pain, unspecified: Secondary | ICD-10-CM

## 2020-07-11 LAB — BPAM RBC
Blood Product Expiration Date: 202110212359
ISSUE DATE / TIME: 202109182122
Unit Type and Rh: 5100

## 2020-07-11 LAB — BLOOD CULTURE ID PANEL (REFLEXED) - BCID2

## 2020-07-11 LAB — CBC
HCT: 28.7 % — ABNORMAL LOW (ref 36.0–46.0)
Hemoglobin: 7.6 g/dL — ABNORMAL LOW (ref 12.0–15.0)
MCH: 17.8 pg — ABNORMAL LOW (ref 26.0–34.0)
MCHC: 26.5 g/dL — ABNORMAL LOW (ref 30.0–36.0)
MCV: 67.1 fL — ABNORMAL LOW (ref 80.0–100.0)
Platelets: 429 10*3/uL — ABNORMAL HIGH (ref 150–400)
RBC: 4.28 MIL/uL (ref 3.87–5.11)
RDW: 29 % — ABNORMAL HIGH (ref 11.5–15.5)
WBC: 10.4 10*3/uL (ref 4.0–10.5)
nRBC: 0 % (ref 0.0–0.2)

## 2020-07-11 LAB — COMPREHENSIVE METABOLIC PANEL
ALT: 8 U/L (ref 0–44)
AST: 11 U/L — ABNORMAL LOW (ref 15–41)
Albumin: 2.7 g/dL — ABNORMAL LOW (ref 3.5–5.0)
Alkaline Phosphatase: 60 U/L (ref 38–126)
Anion gap: 9 (ref 5–15)
BUN: 5 mg/dL — ABNORMAL LOW (ref 8–23)
CO2: 32 mmol/L (ref 22–32)
Calcium: 8.6 mg/dL — ABNORMAL LOW (ref 8.9–10.3)
Chloride: 90 mmol/L — ABNORMAL LOW (ref 98–111)
Creatinine, Ser: 0.5 mg/dL (ref 0.44–1.00)
GFR calc Af Amer: >60 mL/min (ref >60)
GFR calc non Af Amer: >60 mL/min (ref >60)
Glucose, Bld: 146 mg/dL — ABNORMAL HIGH (ref 70–99)
Potassium: 3.7 mmol/L (ref 3.5–5.1)
Sodium: 131 mmol/L — ABNORMAL LOW (ref 135–145)
Total Bilirubin: 0.5 mg/dL (ref 0.3–1.2)
Total Protein: 6.6 g/dL (ref 6.5–8.1)

## 2020-07-11 LAB — IRON AND TIBC
Iron: 10 ug/dL — ABNORMAL LOW (ref 28–170)
Saturation Ratios: 2 % — ABNORMAL LOW (ref 10.4–31.8)
TIBC: 420 ug/dL (ref 250–450)
UIBC: 410 ug/dL

## 2020-07-11 LAB — BODY FLUID CELL COUNT WITH DIFFERENTIAL
Eos, Fluid: 3 %
Lymphs, Fluid: 19 %
Monocyte-Macrophage-Serous Fluid: 53 % (ref 50–90)
Neutrophil Count, Fluid: 25 % (ref 0–25)
Total Nucleated Cell Count, Fluid: 106 cu mm (ref 0–1000)

## 2020-07-11 LAB — PROTIME-INR
INR: 1.1 (ref 0.8–1.2)
Prothrombin Time: 14.1 seconds (ref 11.4–15.2)

## 2020-07-11 LAB — TYPE AND SCREEN
ABO/RH(D): O POS
Antibody Screen: NEGATIVE
Unit division: 0

## 2020-07-11 LAB — GLUCOSE, CAPILLARY
Glucose-Capillary: 117 mg/dL — ABNORMAL HIGH (ref 70–99)
Glucose-Capillary: 169 mg/dL — ABNORMAL HIGH (ref 70–99)

## 2020-07-11 LAB — HEPARIN LEVEL (UNFRACTIONATED): Heparin Unfractionated: 0.33 IU/mL (ref 0.30–0.70)

## 2020-07-11 LAB — TROPONIN I (HIGH SENSITIVITY): Troponin I (High Sensitivity): 4 ng/L (ref <18)

## 2020-07-11 LAB — HIV ANTIBODY (ROUTINE TESTING W REFLEX): HIV Screen 4th Generation wRfx: NONREACTIVE

## 2020-07-11 LAB — PROTEIN, PLEURAL OR PERITONEAL FLUID: Total protein, fluid: 4.3 g/dL

## 2020-07-11 MED ORDER — MORPHINE SULFATE (PF) 2 MG/ML IV SOLN
2.0000 mg | INTRAVENOUS | Status: AC | PRN
  Administered 2020-07-11 – 2020-07-20 (×3): 2 mg via INTRAVENOUS
  Filled 2020-07-11 (×3): qty 1

## 2020-07-11 NOTE — Progress Notes (Signed)
PROGRESS NOTE    Jaime Kelley  VHQ:[id number] DOB: 1951/06/21 DOA: 07/10/2020 PCP: Laurey Morale, MD   Brief Narrative:   Jaime Kelley is a 69 y.o. 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 She is normally on oxygen by nasal cannula at 5 L/min at home  She was seen bu pulm and underwent a thora this am with palns for further pulm eval planned for "airway inspection and pigtail"   Assessment & Plan:   Principal Problem:   Pleural effusion on left Active Problems:   COPD GOLD III   Type 2 diabetes mellitus ([place])   Anticoagulated on Coumadin   Collapsed lung   Anemia   Chest pain   Thrombocytosis (HCC)   Generalized weakness    Pleural effusion on left Jaime Kelley was admitted to stepdown unit for close monitoring.  She has a large pleural effusion on the left which is causing pleat collapse of her left lung.  She is having shortness of breath and requiring nonrebreather at this time.  PCCM, Dr. Corinna Lines, has been consulted by the ER provider and saw the pt in consultation Now s/p thorawith 1300 removed   Collapsed lung Patient with dyspnea secondary to collapsed lung resulting from pleural effusion.  PCCM thoracentesis as above. Will f/up CXR    COPD GOLD III Patient with history of COPD.  DuoNeb provided every 6 hours.  RT to follow.  Incentive spirometer every 2 hours while awake. Supplemental oxygen provided keep O2 sat between 92-96%    Type 2 diabetes mellitus (HCC) Metformin is held for 48 hours secondary to using contrast for CT scan 9/18. Blood sugars will be monitored with meals and bedtime.  SSI as  need for glycemic control    Anemia Patient with hemoglobin of 6.8 ON ADMISSION.  Last hemoglobin in medical record is from 3 years ago was greater  than 13.  Denies brown stool and no report of melena.  Patient was Hemoccult negative in the emergency room. TransfuseD 1 unit of packed red blood cells. Recheck CBC in morning Further work-up for anemia will need to be undertaken.   F/up  iron studies    Chest pain With chest pain that she describes as a substernal chest pressure.  Patient with troponins are negative.  Will check serial troponins.  Continue to monitor on telemetry Likely 2/2 large effusion/sob and compression    Thrombocytosis (HCC) Platelet count elevated at greater than 600,000 now 429k.  Patient is placed on heparin for anticoagulation.   Recheck CBC in morning      Generalized weakness Consult PT for evaluation in am. Pt may need home health vs. SNF placement. States she has been laying in bed for the past month and weak.     Anticoagulated on Coumadin Patient been on Coumadin for a few years she states.  INR subtherapeutic at 1.1.  Is converted to heparin for anticoagulation overnight in anticipation of thoracentesis in the morning, will hold on coumadin 2/2 planned PULM eval for  onday  DVT prophylaxis: On:HEP GTT  Code Status: FULL    Code Status Orders  (From admission, onward)         Start     Ordered   07/10/20 2349  Full code  Continuous        07/10/20  2349        Code Status History    Date Active Date Inactive Code Status Order ID Comments User Context   01/18/2017 1810 01/21/2017 2214 DNR [id number]  Debbe Odea, MD ED   Advance Care Planning Activity     Family Communication: CALLED SON [phone] Disposition Plan:     Patient is from:                        Home             Anticipated DC to:                   Home versus rehab             Anticipated DC date:               1-2 DAY, PT NOT STABLE FOR D/C REQUIRING FURTHER PULM EVAL AND INPT WORKUP             Anticipated DC barriers:         DC plan will be made after PT evaluation and work-up determine if patient will need home  health versus inpatient                                                                        rehabilitation services Consults called: None Admission status: Inpatient   Consultants:   PULM  Procedures:  CT Head Wo Contrast  Result Date: 07/10/2020 CLINICAL DATA:  Delirium EXAM: CT HEAD WITHOUT CONTRAST TECHNIQUE: Contiguous axial images were obtained from the base of the skull through the vertex without intravenous contrast. COMPARISON:  None. FINDINGS: Brain: Normal anatomic configuration. Parenchymal volume loss is advanced in relation to the patient's age. Moderate periventricular white matter changes are present likely reflecting the sequela of small vessel ischemia. Remote left pontine infarct. No abnormal intra or extra-axial mass lesion or fluid collection. No abnormal mass effect or midline shift. No evidence of acute intracranial hemorrhage or infarct. Borderline ventriculomegaly is commensurate with the degree of cerebral atrophy and likely reflects central atrophy. Cerebellum unremarkable. Vascular: No asymmetric hyperdense vasculature at the skull base. Skull: Intact Sinuses/Orbits: Moderate mucosal thickening and air-fluid level noted within the a sphenoid sinus. Osteoma noted within the left frontal sinus. Remaining paranasal sinuses are clear. Orbits are unremarkable. Other: There is near complete fluid opacification of the mastoid air cells and middle ear cavities bilaterally. No definite superimposed osseous erosion. IMPRESSION: No evidence of acute intracranial hemorrhage or infarct. Relatively advanced senescent changes.  Remote left pontine infarct. Extensive fluid opacification of the mastoid air cells and middle ear cavities. Correlation for otomastoiditis is recommended. Sphenoid sinusitis. Electronically Signed   By: Fidela Salisbury MD   On: 07/10/2020 19:35   CT Chest W Contrast  Result Date: 07/10/2020 CLINICAL DATA:  Pleural effusion EXAM: CT CHEST WITH CONTRAST TECHNIQUE:  Multidetector CT imaging of the chest was performed during intravenous contrast administration. CONTRAST:  53mL OMNIPAQUE IOHEXOL 300 MG/ML  SOLN COMPARISON:  None. FINDINGS: Cardiovascular: There is extensive coronary artery calcification. Global cardiac size within normal limits. Left ventricular hypertrophy noted. Moderate calcification of the mitral valve annulus. No pericardial effusion. The  central pulmonary arteries are of normal caliber. The thoracic aorta is of normal caliber. There is extensive atherosclerotic calcifications seen within the arch vasculature proximally, particularly the left subclavian artery, however, the degree of stenosis is not well assessed on this non arteriographic study. Scattered mild atherosclerotic calcifications seen within the descending thoracic aorta., Mediastinum/Nodes: No pathologic adenopathy. Thyroid unremarkable. Esophagus unremarkable. Lungs/Pleura: A large left pleural effusion is present with complete opacification of the left hemithorax and complete collapse of the left lung. There is debris seen within the left mainstem bronchus. Moderate emphysema involving the aerated right lung. No superimposed focal pulmonary infiltrate or nodule. No pneumothorax or pleural effusion on the right. Upper Abdomen: No acute abnormality. Musculoskeletal: Osseous structures are age-appropriate. No acute bone abnormality. IMPRESSION: Large left pleural effusion with complete collapse of the left lung. Moderate debris within the left mainstem bronchus. Extensive coronary artery calcification. Peripheral vascular disease with extensive calcification of the origin of the left subclavian artery. If there is suggestion of hemodynamically significant stenosis (subclavian steal, left upper extremity claudication), this would be better assessed with CT arteriography. Aortic Atherosclerosis (ICD10-I70.0) and Emphysema (ICD10-J43.9). Electronically Signed   By: Fidela Salisbury MD   On: 07/10/2020  19:45   DG CHEST PORT 1 VIEW  Result Date: 07/11/2020 CLINICAL DATA:  Status post thoracocentesis EXAM: PORTABLE CHEST 1 VIEW COMPARISON:  July 10, 2020 FINDINGS: The cardiomediastinal silhouette is completely obscured.Persistent complete opacification of the LEFT hemithorax. Large LEFT pleural effusion. No significant pneumothorax. RIGHT lung is clear. Visualized abdomen is unremarkable. Multilevel degenerative changes of the thoracic spine. IMPRESSION: Persistent complete opacification of the LEFT hemithorax. No significant pneumothorax. Electronically Signed   By: Valentino Saxon MD   On: 07/11/2020 10:30   DG Chest Portable 1 View  Result Date: 07/10/2020 CLINICAL DATA:  Chest pain, shortness of breath, COPD EXAM: PORTABLE CHEST 1 VIEW COMPARISON:  01/18/2017 FINDINGS: There is total opacification of the left hemithorax, which is incompletely imaged due to angulation of radiograph and exclusion inferiorly. There is significant volume loss with leftward shift of the trachea and mediastinal contents. The right lung is normally aerated. IMPRESSION: There is total opacification of the left hemithorax, which is incompletely imaged due to angulation of radiograph and exclusion inferiorly. There is significant volume loss with leftward shift of the trachea and mediastinal contents. Findings are consistent with a large pleural effusion and total atelectasis or consolidation of the left lung, and generally concerning for malignancy. Recommend CT to further evaluate. Electronically Signed   By: Eddie Candle M.D.   On: 07/10/2020 18:25       Subjective: Reports she still feels SOB,  S/P thora this AM, No obvious SOB on exam/clinically  Objective: Vitals:   07/11/20 1030 07/11/20 1057 07/11/20 1132 07/11/20 1201  BP: 110/60 (!) 116/58 110/63   Pulse: 74 73 72   Resp:   18   Temp:   98.2 F (36.8 C)   TempSrc:   Oral   SpO2: 98% 100% 98% 100%  Weight:      Height:        Intake/Output  Summary (Last 24 hours) at 07/11/2020 1258 Last data filed at 07/11/2020 0300 Gross per 24 hour  Intake 771.86 ml  Output --  Net 771.86 ml   Filed Weights   07/10/20 2119  Weight: 97.5 kg    Examination:  General: WDWN chronically ill appearing female who appears older than stated age, Alert and oriented x3.  Eyes: EOMI, PERRL, lids normal.  Conjunctivae pale.  Sclera nonicteric. No discharge HENT:  [place]/AT, external ears normal. Nares patent without epistasis.  Mucous membranes are pale.  Neck: Soft, normal range of motion, supple, no masses, no thyromegaly.  Trachea midline Respiratory:  No wheezing or rhonchi, mild rales left> right, nl rate Cardiovascular: Regular rate and rhythm, no murmurs / rubs / gallops. No extremity edema. 1+ pedal pulses.  Abdomen: Soft, no tenderness, nondistended, no rebound or guarding. Obese. No masses palpated. Bowel sounds normoactive Musculoskeletal: FROM. Has clubbing of digits. No cyanosis. No joint deformity upper and lower extremities.   Skin: Warm, dry, intact no rashes, lesions, ulcers. No induration Neurologic: CN 2-12 grossly intact.  Normal speech.  Sensation intact, Psychiatric: Normal judgment and insight.  Normal mood.     Data Reviewed: I have personally reviewed following labs and imaging studies  CBC: Recent Labs  Lab 07/10/20 1759 07/10/20 2132 07/11/20 0226  WBC 6.8  --  10.4  NEUTROABS 4.1  --   --   HGB 6.5* 8.8* 7.6*  HCT 26.8* 26.0* 28.7*  MCV 63.4*  --  67.1*  PLT 607*  --  465*   Basic Metabolic Panel: Recent Labs  Lab 07/10/20 1759 07/10/20 2132 07/11/20 0226  NA 132* 130* 131*  K 3.3* 3.7 3.7  CL 90*  --  90*  CO2 30  --  32  GLUCOSE 168*  --  146*  BUN <5*  --  5*  CREATININE 0.52  --  0.50  CALCIUM 8.6*  --  8.6*   GFR: Estimated Creatinine Clearance: 72.4 mL/min (by C-G formula based on SCr of 0.5 mg/dL). Liver Function Tests: Recent Labs  Lab 07/10/20 1759 07/11/20 0226  AST 13* 11*  ALT 8 8   ALKPHOS 56 60  BILITOT 0.4 0.5  PROT 6.6 6.6  ALBUMIN 2.7* 2.7*   No results for input(s): LIPASE, AMYLASE in the last 168 hours. No results for input(s): AMMONIA in the last 168 hours. Coagulation Profile: Recent Labs  Lab 07/10/20 1816 07/11/20 0226  INR 1.1 1.1   Cardiac Enzymes: No results for input(s): CKTOTAL, CKMB, CKMBINDEX, TROPONINI in the last 168 hours. BNP (last 3 results) No results for input(s): PROBNP in the last 8760 hours. HbA1C: No results for input(s): HGBA1C in the last 72 hours. CBG: Recent Labs  Lab 07/10/20 1835 07/11/20 0019 07/11/20 1129  GLUCAP 156* 169* 117*   Lipid Profile: No results for input(s): CHOL, HDL, LDLCALC, TRIG, CHOLHDL, LDLDIRECT in the last 72 hours. Thyroid Function Tests: No results for input(s): TSH, T4TOTAL, FREET4, T3FREE, THYROIDAB in the last 72 hours. Anemia Panel: Recent Labs    07/10/20 2146  TIBC 420  IRON 10*   Sepsis Labs: Recent Labs  Lab 07/10/20 1756  LATICACIDVEN 1.8    Recent Results (from the past 240 hour(s))  SARS Coronavirus 2 by RT PCR (hospital order, performed in [facility] hospital lab) Nasopharyngeal Nasopharyngeal Swab     Status: None   Collection Time: 07/10/20  5:35 PM   Specimen: Nasopharyngeal Swab  Result Value Ref Range Status   SARS Coronavirus 2 NEGATIVE NEGATIVE Final    Comment: (NOTE) SARS-CoV-2 target nucleic acids are NOT DETECTED.  The SARS-CoV-2 RNA is generally detectable in upper and lower respiratory specimens during the acute phase of infection. The lowest concentration of SARS-CoV-2 viral copies this assay can detect is 250 copies / mL. A negative result does not preclude SARS-CoV-2 infection and should not be used as the sole basis  for treatment or other patient management decisions.  A negative result may occur with improper specimen collection / handling, submission of specimen other than nasopharyngeal swab, presence of viral mutation(s) within the areas  targeted by this assay, and inadequate number of viral copies (<250 copies / mL). A negative result must be combined with clinical observations, patient history, and epidemiological information.  Fact Sheet for Patients:   StrictlyIdeas.no  Fact Sheet for Healthcare Providers: BankingDealers.co.za  This test is not yet approved or  cleared by the [place] FDA and has been authorized for detection and/or diagnosis of SARS-CoV-2 by FDA under an Emergency Use Authorization (EUA).  This EUA will remain in effect (meaning this test can be used) for the duration of the COVID-19 declaration under Section 564(b)(1) of the Act, 21 U.S.C. section 360bbb-3(b)(1), unless the authorization is terminated or revoked sooner.  Performed at [facility], [place] [address], [place], [place] [address]   Culture, blood (routine x 2)     Status: None (Preliminary result)   Collection Time: 07/10/20  5:36 PM   Specimen: BLOOD  Result Value Ref Range Status   Specimen Description BLOOD LEFT ANTECUBITAL  Final   Special Requests   Final    BOTTLES DRAWN AEROBIC AND ANAEROBIC Blood Culture adequate volume   Culture  Setup Time   Final    GRAM POSITIVE COCCI IN CLUSTERS ANAEROBIC BOTTLE ONLY CRITICAL RESULT CALLED TO, READ BACK BY AND VERIFIED WITH: Alycia Patten [phone] FCP Performed at [facility], [place] [address], [place], [place] [address]    Culture GRAM POSITIVE COCCI  Final   Report Status PENDING  Incomplete  Blood Culture ID Panel (Reflexed)     Status: Abnormal   Collection Time: 07/10/20  5:36 PM  Result Value Ref Range Status   Enterococcus faecalis NOT DETECTED NOT DETECTED Final   Enterococcus Faecium NOT DETECTED NOT DETECTED Final   Listeria monocytogenes NOT DETECTED NOT DETECTED Final   Staphylococcus species DETECTED (A) NOT DETECTED Final    Comment: CRITICAL RESULT CALLED TO, READ BACK BY AND VERIFIED WITH: PHARMD  K PIERCE [phone] FCP    Staphylococcus aureus (BCID) NOT DETECTED NOT DETECTED Final   Staphylococcus epidermidis DETECTED (A) NOT DETECTED Final    Comment: CRITICAL RESULT CALLED TO, READ BACK BY AND VERIFIED WITH: PHARMD K PIERCE [id number] FCP    Staphylococcus lugdunensis NOT DETECTED NOT DETECTED Final   Streptococcus species NOT DETECTED NOT DETECTED Final   Streptococcus agalactiae NOT DETECTED NOT DETECTED Final   Streptococcus pneumoniae NOT DETECTED NOT DETECTED Final   Streptococcus pyogenes NOT DETECTED NOT DETECTED Final   A.calcoaceticus-baumannii NOT DETECTED NOT DETECTED Final   Bacteroides fragilis NOT DETECTED NOT DETECTED Final   Enterobacterales NOT DETECTED NOT DETECTED Final   Enterobacter cloacae complex NOT DETECTED NOT DETECTED Final   Escherichia coli NOT DETECTED NOT DETECTED Final   Klebsiella aerogenes NOT DETECTED NOT DETECTED Final   Klebsiella oxytoca NOT DETECTED NOT DETECTED Final   Klebsiella pneumoniae NOT DETECTED NOT DETECTED Final   Proteus species NOT DETECTED NOT DETECTED Final   Salmonella species NOT DETECTED NOT DETECTED Final   Serratia marcescens NOT DETECTED NOT DETECTED Final   Haemophilus influenzae NOT DETECTED NOT DETECTED Final   Neisseria meningitidis NOT DETECTED NOT DETECTED Final   Pseudomonas aeruginosa NOT DETECTED NOT DETECTED Final   Stenotrophomonas maltophilia NOT DETECTED NOT DETECTED Final   Candida albicans NOT DETECTED NOT DETECTED Final  Candida auris NOT DETECTED NOT DETECTED Final   Candida glabrata NOT DETECTED NOT DETECTED Final   Candida krusei NOT DETECTED NOT DETECTED Final   Candida parapsilosis NOT DETECTED NOT DETECTED Final   Candida tropicalis NOT DETECTED NOT DETECTED Final   Cryptococcus neoformans/gattii NOT DETECTED NOT DETECTED Final   Methicillin resistance mecA/C NOT DETECTED NOT DETECTED Final    Comment: Performed at [facility], [place] [address], [place], [place] [address]  Body  fluid culture (includes gram stain)     Status: None (Preliminary result)   Collection Time: 07/11/20  9:30 AM   Specimen: Pleural Fluid  Result Value Ref Range Status   Specimen Description PLEURAL FLUID  Final   Special Requests LEFT  Final   Gram Stain   Final    WBC PRESENT,BOTH PMN AND MONONUCLEAR NO ORGANISMS SEEN CYTOSPIN SMEAR Performed at [facility], [address], [place],  [address]    Culture PENDING  Incomplete   Report Status PENDING  Incomplete         Radiology Studies: CT Head Wo Contrast  Result Date: 07/10/2020 CLINICAL DATA:  Delirium EXAM: CT HEAD WITHOUT CONTRAST TECHNIQUE: Contiguous axial images were obtained from the base of the skull through the vertex without intravenous contrast. COMPARISON:  None. FINDINGS: Brain: Normal anatomic configuration. Parenchymal volume loss is advanced in relation to the patient's age. Moderate periventricular white matter changes are present likely reflecting the sequela of small vessel ischemia. Remote left pontine infarct. No abnormal intra or extra-axial mass lesion or fluid collection. No abnormal mass effect or midline shift. No evidence of acute intracranial hemorrhage or infarct. Borderline ventriculomegaly is commensurate with the degree of cerebral atrophy and likely reflects central atrophy. Cerebellum unremarkable. Vascular: No asymmetric hyperdense vasculature at the skull base. Skull: Intact Sinuses/Orbits: Moderate mucosal thickening and air-fluid level noted within the a sphenoid sinus. Osteoma noted within the left frontal sinus. Remaining paranasal sinuses are clear. Orbits are unremarkable. Other: There is near complete fluid opacification of the mastoid air cells and middle ear cavities bilaterally. No definite superimposed osseous erosion. IMPRESSION: No evidence of acute intracranial hemorrhage or infarct. Relatively advanced senescent changes.  Remote left pontine infarct. Extensive fluid  opacification of the mastoid air cells and middle ear cavities. Correlation for otomastoiditis is recommended. Sphenoid sinusitis. Electronically Signed   By: Fidela Salisbury MD   On: 07/10/2020 19:35   CT Chest W Contrast  Result Date: 07/10/2020 CLINICAL DATA:  Pleural effusion EXAM: CT CHEST WITH CONTRAST TECHNIQUE: Multidetector CT imaging of the chest was performed during intravenous contrast administration. CONTRAST:  36mL OMNIPAQUE IOHEXOL 300 MG/ML  SOLN COMPARISON:  None. FINDINGS: Cardiovascular: There is extensive coronary artery calcification. Global cardiac size within normal limits. Left ventricular hypertrophy noted. Moderate calcification of the mitral valve annulus. No pericardial effusion. The central pulmonary arteries are of normal caliber. The thoracic aorta is of normal caliber. There is extensive atherosclerotic calcifications seen within the arch vasculature proximally, particularly the left subclavian artery, however, the degree of stenosis is not well assessed on this non arteriographic study. Scattered mild atherosclerotic calcifications seen within the descending thoracic aorta., Mediastinum/Nodes: No pathologic adenopathy. Thyroid unremarkable. Esophagus unremarkable. Lungs/Pleura: A large left pleural effusion is present with complete opacification of the left hemithorax and complete collapse of the left lung. There is debris seen within the left mainstem bronchus. Moderate emphysema involving the aerated right lung. No superimposed focal pulmonary infiltrate or nodule. No pneumothorax or pleural effusion  on the right. Upper Abdomen: No acute abnormality. Musculoskeletal: Osseous structures are age-appropriate. No acute bone abnormality. IMPRESSION: Large left pleural effusion with complete collapse of the left lung. Moderate debris within the left mainstem bronchus. Extensive coronary artery calcification. Peripheral vascular disease with extensive calcification of the origin of the  left subclavian artery. If there is suggestion of hemodynamically significant stenosis (subclavian steal, left upper extremity claudication), this would be better assessed with CT arteriography. Aortic Atherosclerosis (ICD10-I70.0) and Emphysema (ICD10-J43.9). Electronically Signed   By: Fidela Salisbury MD   On: 07/10/2020 19:45   DG CHEST PORT 1 VIEW  Result Date: 07/11/2020 CLINICAL DATA:  Status post thoracocentesis EXAM: PORTABLE CHEST 1 VIEW COMPARISON:  July 10, 2020 FINDINGS: The cardiomediastinal silhouette is completely obscured.Persistent complete opacification of the LEFT hemithorax. Large LEFT pleural effusion. No significant pneumothorax. RIGHT lung is clear. Visualized abdomen is unremarkable. Multilevel degenerative changes of the thoracic spine. IMPRESSION: Persistent complete opacification of the LEFT hemithorax. No significant pneumothorax. Electronically Signed   By: Valentino Saxon MD   On: 07/11/2020 10:30   DG Chest Portable 1 View  Result Date: 07/10/2020 CLINICAL DATA:  Chest pain, shortness of breath, COPD EXAM: PORTABLE CHEST 1 VIEW COMPARISON:  01/18/2017 FINDINGS: There is total opacification of the left hemithorax, which is incompletely imaged due to angulation of radiograph and exclusion inferiorly. There is significant volume loss with leftward shift of the trachea and mediastinal contents. The right lung is normally aerated. IMPRESSION: There is total opacification of the left hemithorax, which is incompletely imaged due to angulation of radiograph and exclusion inferiorly. There is significant volume loss with leftward shift of the trachea and mediastinal contents. Findings are consistent with a large pleural effusion and total atelectasis or consolidation of the left lung, and generally concerning for malignancy. Recommend CT to further evaluate. Electronically Signed   By: Eddie Candle M.D.   On: 07/10/2020 18:25        Scheduled Meds: . amLODipine  10 mg  Oral QHS  . ipratropium-albuterol  3 mL Nebulization Q6H  . levothyroxine  137 mcg Oral Q0600  . losartan  100 mg Oral QHS  . sertraline  100 mg Oral QHS   Continuous Infusions:   LOS: 1 day    Time spent: 26 min    Nicolette Bang, MD [facility]  If 7PM-7AM, please contact night-coverage  07/11/2020, 12:58 PM

## 2020-07-11 NOTE — Progress Notes (Signed)
Imaging reviewed. Probably needs airway inspection and pigtail, discussed with Icard who will perform tomorrow.  Does not need bridging with heparin drip so this has been turned off. Patient updated.  Erskine Emery MD PCCM

## 2020-07-11 NOTE — Procedures (Signed)
Thoracentesis  Procedure Note  Jaime Kelley  [id number]  Oct 18, 1951  Date:07/11/20  Time:10:05 AM   Provider Performing:Josuha Fontanez C Tamala Julian   Procedure: Thoracentesis with imaging guidance (00298)  Indication(s) Pleural Effusion  Consent Risks of the procedure as well as the alternatives and risks of each were explained to the patient and/or caregiver.  Consent for the procedure was obtained and is signed in the bedside chart  Anesthesia Topical only with 1% lidocaine    Time Out Verified patient identification, verified procedure, site/side was marked, verified correct patient position, special equipment/implants available, medications/allergies/relevant history reviewed, required imaging and test results available.   Sterile Technique Maximal sterile technique including full sterile barrier drape, hand hygiene, sterile gown, sterile gloves, mask, hair covering, sterile ultrasound probe cover (if used).  Procedure Description Ultrasound was used to identify appropriate pleural anatomy for placement and overlying skin marked.  Area of drainage cleaned and draped in sterile fashion. Lidocaine was used to anesthetize the skin and subcutaneous tissue.  1300 cc's of straw-clear appearing fluid was drained from the left pleural space. Catheter then removed and bandaid applied to site.   Complications/Tolerance Had pleurisy after 1300cc removed suggesting some element of entrapment.  Will see what CXR looks like but need to consider bronchoscopy if lung not re-inflating  Chest X-ray is ordered to confirm no post-procedural complication.   EBL Minimal   Specimen(s) Pleural fluid

## 2020-07-11 NOTE — Consult Note (Addendum)
NAME:  Jaime Kelley, MRN:  [id number], DOB:  29-Jul-1951, LOS: 1 ADMISSION DATE:  07/10/2020, CONSULTATION DATE:  07/11/20 REFERRING MD:  07/11/20, CHIEF COMPLAINT:  SOB   Brief History   69 year old former smoker, DM2, COPD, RA, hx DVT/PE presenting with insidious onset dyspnea found to have abnormal chest imaging.  History of present illness   69 year old former smoker, DM2, COPD, RA, hx DVT/PE presenting with insidious onset dyspnea beginning 3-4 months ago.  Began [facility] [facility] now [facility].  Found to have large left effusion and suggestion of endobronchial debris/mass so pulmonary consulted.  Hx of 2ppd smoking quit last year after diagnosis of COPD.   Denies sick contacts, trauma, falls.  Occasional trouble swallowing. No fevers, chills, night sweats, weight loss.  Has cough but unable to bring anything up.  Associated with the worsening dyspnea is sternal pressure worse with deep breathing which has been present for past week.  Notable labs include INR 1.1 despite her stating compliance with home coumadin and Hgb 6.8.  Past Medical History   Past Medical History:  Diagnosis Date  . Anxiety   . COPD (chronic obstructive pulmonary disease) ([place])   . Depression   . Diabetes mellitus without complication ([place])   . Discoid lupus    sees Dr. Allyson Sabal  . Dyspnea   . GERD (gastroesophageal reflux disease)   . History of colonic polyps   . Hx pulmonary embolism   . Hypertension   . Hypothyroidism   . Rheumatoid arthritis(714.0)    sees Dr. Dossie Der   . Thyroid disease      Significant Hospital Events   9/18 admitted  Consults:  N/A  Procedures:  9/19 Thoracentesis  Significant Diagnostic Tests:  CT chest complete collapse LLL with debris in mainstem downward, large left effusion, no adenopathy, no PE/DVT  Micro Data:  COVID Neg  Antimicrobials:  None   Interim history/subjective:  Consulted  Objective   Blood pressure 114/62, pulse 77, temperature 98 F (36.7 C),  temperature source Oral, resp. rate 19, height 5\' 2"  (1.575 m), weight 97.5 kg, SpO2 100 %.    FiO2 (%):  [40 %-100 %] 40 %   Intake/Output Summary (Last 24 hours) at 07/11/2020 6644 Last data filed at 07/11/2020 0300 Gross per 24 hour  Intake 771.86 ml  Output --  Net 771.86 ml   Filed Weights   07/10/20 2119  Weight: 97.5 kg    Examination: Constitutional: ill appearing woman in mild respiratory distress Eyes: eyes are anicteric, reactive to light Ears, nose, mouth, and throat: mucous membranes moist, trachea midline Cardiovascular: heart sounds are regular, ext are warm to touch. no edema Respiratory: absent on left, clear on right, large effusion with [place] Gastrointestinal: abdomen is soft with + BS Skin: No rashes, normal turgor Neurologic: very hard of hearing, moves all 4 ext to command, profoundly weak, has been unable to transfer for past month due to combination of weakness and breathlessness Psychiatric: poor insight but AOx3  Labs notable for mild hyponatremia, low albumin, iron deficiency anemia, chronic compensated respiratory acidemia  PFTs 2018 showing ratio 0.47, FEV1 1.1L (45% pred), DLCO 43% pred  Resolved Hospital Problem list   N/A  Assessment & Plan:  Assessment -Acute hypoxemic respiratory failure due to large left pleural effusion and question of endobronchial debris vs. Obstruction; insidious onset worrisome for malignancy -Iron deficiency anemia of unclear origin -Abnormal chest imaging in smoker -COPD -Hx of VTE on warfarin with subtherapeutic INR,  CTA no PE  Plan - Drain left pleural space, send for usual studies - If incomplete expansion of left lung will need to consider bronch - Heparin gtt fine for now - IS, flutter, nebs, wean O2 as able - Transfusion goals and further workup of anemia per primary  Labs   CBC: Recent Labs  Lab 07/10/20 1759 07/10/20 2132 07/11/20 0226  WBC 6.8  --  10.4  NEUTROABS 4.1  --   --   HGB 6.5* 8.8* 7.6*   HCT 26.8* 26.0* 28.7*  MCV 63.4*  --  67.1*  PLT 607*  --  429*    Basic Metabolic Panel: Recent Labs  Lab 07/10/20 1759 07/10/20 2132 07/11/20 0226  NA 132* 130* 131*  K 3.3* 3.7 3.7  CL 90*  --  90*  CO2 30  --  32  GLUCOSE 168*  --  146*  BUN <5*  --  5*  CREATININE 0.52  --  0.50  CALCIUM 8.6*  --  8.6*   GFR: Estimated Creatinine Clearance: 72.4 mL/min (by C-G formula based on SCr of 0.5 mg/dL). Recent Labs  Lab 07/10/20 1756 07/10/20 1759 07/11/20 0226  WBC  --  6.8 10.4  LATICACIDVEN 1.8  --   --     Liver Function Tests: Recent Labs  Lab 07/10/20 1759 07/11/20 0226  AST 13* 11*  ALT 8 8  ALKPHOS 56 60  BILITOT 0.4 0.5  PROT 6.6 6.6  ALBUMIN 2.7* 2.7*   No results for input(s): LIPASE, AMYLASE in the last 168 hours. No results for input(s): AMMONIA in the last 168 hours.  ABG    Component Value Date/Time   PHART 7.404 07/10/2020 2132   PCO2ART 56.5 (H) 07/10/2020 2132   PO2ART 89 07/10/2020 2132   HCO3 35.3 (H) 07/10/2020 2132   TCO2 37 (H) 07/10/2020 2132   O2SAT 97.0 07/10/2020 2132     Coagulation Profile: Recent Labs  Lab 07/10/20 1816 07/11/20 0226  INR 1.1 1.1    Cardiac Enzymes: No results for input(s): CKTOTAL, CKMB, CKMBINDEX, TROPONINI in the last 168 hours.  HbA1C: Hgb A1c MFr Bld  Date/Time Value Ref Range Status  08/14/2017 01:42 PM 6.0 4.6 - 6.5 % Final    Comment:    Glycemic Control Guidelines for People with Diabetes:Non Diabetic:  <6%Goal of Therapy: <7%Additional Action Suggested:  >8%   01/18/2017 07:20 PM 6.1 (H) 4.8 - 5.6 % Final    Comment:    (NOTE)         Pre-diabetes: 5.7 - 6.4         Diabetes: >6.4         Glycemic control for adults with diabetes: <7.0     CBG: Recent Labs  Lab 07/10/20 1835 07/11/20 0019  GLUCAP 156* 169*    Review of Systems:    Positive Symptoms in bold:  Constitutional fevers, chills, weight loss, fatigue, anorexia, malaise  Eyes decreased vision, double vision,  eye irritation  Ears, Nose, Mouth, Throat sore throat, trouble swallowing, sinus congestion  Cardiovascular chest pain, paroxysmal nocturnal dyspnea, lower ext edema, palpitations   Respiratory SOB, cough, DOE, hemoptysis, wheezing  Gastrointestinal nausea, vomiting, diarrhea  Genitourinary burning with urination, trouble urinating  Musculoskeletal joint aches, joint swelling, back pain  Integumentary  rashes, skin lesions  Neurological focal weakness, focal numbness, trouble speaking, headaches  Psychiatric depression, anxiety, confusion  Endocrine polyuria, polydipsia, cold intolerance, heat intolerance  Hematologic abnormal bruising, abnormal bleeding, unexplained nose bleeds  Allergic/Immunologic recurrent infections, hives, swollen lymph nodes     Past Medical History  She,  has a past medical history of Anxiety, COPD (chronic obstructive pulmonary disease) ([place]), Depression, Diabetes mellitus without complication ([place]), Discoid lupus, Dyspnea, GERD (gastroesophageal reflux disease), History of colonic polyps, pulmonary embolism, Hypertension, Hypothyroidism, Rheumatoid arthritis(714.0), and Thyroid disease.   Surgical History    Past Surgical History:  Procedure Laterality Date  . ABDOMINAL HYSTERECTOMY    . arthroscopy right knee    . BREAST EXCISIONAL BIOPSY Right 2011  . carpal tunnel release left wrist    . 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 TONSILLECTOMY       Social History   reports that she quit smoking about 3 years ago. Her smoking use included cigarettes. She has a 40.00 pack-year smoking history. She has never used smokeless tobacco. She reports that she does not drink alcohol and does not use drugs.   Family History   Her family history includes Arthritis in an other family member; Cancer in an other family member; Coronary artery disease in an other family member; Hypertension in an other family member; Lymphoma in her son; Stroke in an other  family member.   Allergies Allergies  Allergen Reactions  . Effexor [Venlafaxine Hydrochloride] Rash     Home Medications  Prior to Admission medications   Medication Sig Start Date End Date Taking? Authorizing Provider  albuterol (PROAIR HFA) 108 (90 Base) MCG/ACT inhaler INHALE 2 PUFFS INTO THE LUNGS EVERY 4 (FOUR) HOURS AS NEEDED FOR WHEEZING. Patient taking differently: Inhale 2 puffs into the lungs every 4 (four) hours as needed for wheezing or shortness of breath.  01/02/20  Yes Laurey Morale, MD  amLODipine (NORVASC) 10 MG tablet TAKE 1 TABLET BY MOUTH EVERY DAY Patient taking differently: Take 10 mg by mouth at bedtime.  04/23/20  Yes Laurey Morale, MD  diphenhydrAMINE (BENADRYL ALLERGY) 25 MG tablet Take 25 mg by mouth every 6 (six) hours.    Yes [provider]  HYDROcodone-acetaminophen (NORCO) 10-325 MG tablet Take 1 tablet by mouth every 6 (six) hours as needed for moderate pain. Patient taking differently: Take 0.5 tablets by mouth 4 (four) times daily as needed (pain).  07/05/20 08/04/20 Yes Laurey Morale, MD  ipratropium-albuterol (DUONEB) 0.5-2.5 (3) MG/3ML SOLN TAKE 3 MLS BY NEBULIZATION EVERY 4 (FOUR) HOURS AS NEEDED. Patient taking differently: Take 3 mLs by nebulization 4 (four) times daily as needed (shortness of breath/wheezing).  08/27/19  Yes Laurey Morale, MD  losartan (COZAAR) 100 MG tablet TAKE 1 TABLET BY MOUTH EVERY DAY Patient taking differently: Take 100 mg by mouth at bedtime.  07/02/20  Yes Laurey Morale, MD  metFORMIN (GLUCOPHAGE) 500 MG tablet TAKE 1 TABLET BY MOUTH TWICE A DAY Patient taking differently: Take 500 mg by mouth 2 (two) times daily with a meal.  01/27/20  Yes Laurey Morale, MD  OXYGEN Inhale 5 L into the lungs. continuous   Yes [provider]  sertraline (ZOLOFT) 100 MG tablet TAKE 1 TABLET BY MOUTH EVERY DAY Patient taking differently: Take 100 mg by mouth at bedtime.  04/10/19  Yes Laurey Morale, MD  SYNTHROID 137 MCG  tablet TAKE 1 TABLET BY MOUTH EVERY DAY BEFORE BREAKFAST Patient taking differently: Take 137 mcg by mouth daily before breakfast.  04/23/20  Yes Laurey Morale, MD  warfarin (COUMADIN) 7.5 MG tablet Take 1 tablet daily except take 1/2 tablet on Wed  or AS DIRECTED BY ANTICOAGULATION CLINIC Patient taking differently: Take 3.75-7.5 mg by mouth See admin instructions. Take 1/2 tablet (3.75 mg) by mouth on Wednesday evening, take 1 tablet (7.5 mg) on all other evenings of the week 01/13/20  Yes Laurey Morale, MD  chlordiazePOXIDE (LIBRIUM) 5 MG capsule Take 1 capsule (5 mg total) by mouth 3 (three) times daily as needed for anxiety. Patient not taking: Reported on 07/10/2020 06/25/20   Laurey Morale, MD

## 2020-07-11 NOTE — Evaluation (Signed)
Physical Therapy Evaluation Patient Details Name: Jaime Kelley MRN: [id number] DOB: Feb 20, 1951 Today's Date: 07/11/2020   History of Present Illness  Jaime Kelley a 69 y.o.femalewith medical history significant forCOPD, diabetes mellitus type 2, RA, thyroidism, hypertension, history of PE/DVT on chronic Coumadin who presents to the hospital via EMS with complaint of substernal chest pain and shortness of breath.  Clinical Impression  Pt admitted with above diagnosis. Pt was able to sit EOB for 5 min with min guard assist. Pt tolerated well but did not want to sit for long and refused to stand. Pt states she has not been OOB in months and that caregiver changes her when she uses bathroom and brings her food.  Feel that pt may need SNF for skilled care.  Will follow acutely.  Pt currently with functional limitations due to the deficits listed below (see PT Problem List). Pt will benefit from skilled PT to increase their independence and safety with mobility to allow discharge to the venue listed below.      Follow Up Recommendations SNF;Supervision/Assistance - 24 hour    Equipment Recommendations  Other (comment) (TBA)    Recommendations for Other Services       Precautions / Restrictions Precautions Precautions: Fall Restrictions Weight Bearing Restrictions: No      Mobility  Bed Mobility Overal bed mobility: Needs Assistance Bed Mobility: Supine to Sit     Supine to sit: Mod assist     General bed mobility comments: assist for LEs and for elevation of trunk.   Transfers Overall transfer level: Needs assistance   Transfers: Sit to/from Stand           General transfer comment: Pt would not attempt today  Ambulation/Gait             General Gait Details: States she hasnt got OOB in months  Stairs            Wheelchair Mobility    Modified Rankin (Stroke Patients Only)       Balance Overall balance assessment: Needs  assistance Sitting-balance support: No upper extremity supported;Feet supported;Bilateral upper extremity supported Sitting balance-[name]: Fair Sitting balance - Comments: pt able to sit EOB for 5 min without assist. Pt asked to lie back down as she states she was "tired".  Did a few LE exercises.                                     Pertinent Vitals/Pain Pain Assessment: No/denies pain    Home Living Family/patient expects to be discharged to:: Private residence Living Arrangements: Other (Comment) (roommate) Available Help at Discharge: Friend(s);Available 24 hours/day Type of Home: House Home Access: Stairs to enter Entrance Stairs-Rails: Right Entrance Stairs-Number of Steps: 3 Home Layout: Two level;Bed/bath upstairs;1/2 bath on main level Home Equipment: Walker - 2 wheels Additional Comments: 5LO2 at baseline    Prior Function Level of Independence: Needs assistance   Gait / Transfers Assistance Needed: Pt reports she has stayed in bed for the past couple months  ADL's / Homemaking Assistance Needed: Roommate cleans pt after a BM per pt  Comments: roommate gets pt food and drinks      Hand Dominance   Dominant Hand: Right    Extremity/Trunk Assessment   Upper Extremity Assessment Upper Extremity Assessment: Defer to OT evaluation    Lower Extremity Assessment Lower Extremity Assessment: Generalized weakness    Cervical /  Trunk Assessment Cervical / Trunk Assessment: Kyphotic  Communication   Communication: No difficulties  Cognition Arousal/Alertness: Awake/alert Behavior During Therapy: WFL for tasks assessed/performed Overall Cognitive Status: History of cognitive impairments - at baseline                                        General Comments General comments (skin integrity, edema, etc.): Pt on 4LO2 simple mask with VSS    Exercises General Exercises - Lower Extremity Ankle Circles/Pumps: AROM;Both;10  reps;Seated Long Arc Quad: AROM;Both;10 reps;Seated   Assessment/Plan    PT Assessment Patient needs continued PT services  PT Problem List Decreased activity tolerance;Decreased balance;Decreased mobility;Decreased knowledge of use of DME;Decreased safety awareness;Decreased knowledge of precautions;Cardiopulmonary status limiting activity       PT Treatment Interventions DME instruction;Functional mobility training;Therapeutic activities;Therapeutic exercise;Balance training;Patient/family education    PT Goals (Current goals can be found in the Care Plan section)  Acute Rehab PT Goals Patient Stated Goal: unable to state PT Goal Formulation: With patient Time For Goal Achievement: 07/25/20 Potential to Achieve Goals: Good    Frequency Min 2X/week   Barriers to discharge Decreased caregiver support      Co-evaluation               AM-PAC PT "6 Clicks" Mobility  Outcome Measure Help needed turning from your back to your side while in a flat bed without using bedrails?: A Lot Help needed moving from lying on your back to sitting on the side of a flat bed without using bedrails?: A Lot Help needed moving to and from a bed to a chair (including a wheelchair)?: A Lot Help needed standing up from a chair using your arms (e.g., wheelchair or bedside chair)?: Total Help needed to walk in hospital room?: Total Help needed climbing 3-5 steps with a railing? : Total 6 Click Score: 9    End of Session Equipment Utilized During Treatment: Gait belt;Oxygen Activity Tolerance: Patient limited by fatigue Patient left: in bed;with call bell/phone within reach;with bed alarm set Nurse Communication: Mobility status PT Visit Diagnosis: Muscle weakness (generalized) (M62.81)    Time: 1425-1440 PT Time Calculation (min) (ACUTE ONLY): 15 min   Charges:   PT Evaluation $PT Eval Moderate Complexity: 1 Mod          Jaime Kelley,PT Acute Rehabilitation Services Pager:  [phone]   Office:  [phone]    Jaime Kelley 07/11/2020, 3:48 PM

## 2020-07-11 NOTE — Plan of Care (Signed)
?  Problem: Coping: ?Goal: Level of anxiety will decrease ?Outcome: Progressing ?  ?Problem: Safety: ?Goal: Ability to remain free from injury will improve ?Outcome: Progressing ?  ?

## 2020-07-11 NOTE — H&P (View-Only) (Signed)
NAME:  Jaime Kelley, MRN:  [id number], DOB:  1951/09/24, LOS: 1 ADMISSION DATE:  07/10/2020, CONSULTATION DATE:  07/11/20 REFERRING MD:  07/11/20, CHIEF COMPLAINT:  SOB   Brief History   69 year old former smoker, DM2, COPD, RA, hx DVT/PE presenting with insidious onset dyspnea found to have abnormal chest imaging.  History of present illness   69 year old former smoker, DM2, COPD, RA, hx DVT/PE presenting with insidious onset dyspnea beginning 3-4 months ago.  Began [place] 1 now [place] [facility].  Found to have large left effusion and suggestion of endobronchial debris/mass so pulmonary consulted.  Hx of 2ppd smoking quit last year after diagnosis of COPD.   Denies sick contacts, trauma, falls.  Occasional trouble swallowing. No fevers, chills, night sweats, weight loss.  Has cough but unable to bring anything up.  Associated with the worsening dyspnea is sternal pressure worse with deep breathing which has been present for past week.  Notable labs include INR 1.1 despite her stating compliance with home coumadin and Hgb 6.8.  Past Medical History   Past Medical History:  Diagnosis Date  . Anxiety   . COPD (chronic obstructive pulmonary disease) ([place])   . Depression   . Diabetes mellitus without complication ([place])   . Discoid lupus    sees Dr. Allyson Sabal  . Dyspnea   . GERD (gastroesophageal reflux disease)   . History of colonic polyps   . Hx pulmonary embolism   . Hypertension   . Hypothyroidism   . Rheumatoid arthritis(714.0)    sees Dr. Dossie Der   . Thyroid disease      Significant Hospital Events   9/18 admitted  Consults:  N/A  Procedures:  9/19 Thoracentesis  Significant Diagnostic Tests:  CT chest complete collapse LLL with debris in mainstem downward, large left effusion, no adenopathy, no PE/DVT  Micro Data:  COVID Neg  Antimicrobials:  None   Interim history/subjective:  Consulted  Objective   Blood pressure 114/62, pulse 77, temperature 98 F (36.7 C),  temperature source Oral, resp. rate 19, height 5\' 2"  (1.575 m), weight 97.5 kg, SpO2 100 %.    FiO2 (%):  [40 %-100 %] 40 %   Intake/Output Summary (Last 24 hours) at 07/11/2020 5170 Last data filed at 07/11/2020 0300 Gross per 24 hour  Intake 771.86 ml  Output --  Net 771.86 ml   Filed Weights   07/10/20 2119  Weight: 97.5 kg    Examination: Constitutional: ill appearing woman in mild respiratory distress Eyes: eyes are anicteric, reactive to light Ears, nose, mouth, and throat: mucous membranes moist, trachea midline Cardiovascular: heart sounds are regular, ext are warm to touch. no edema Respiratory: absent on left, clear on right, large effusion with [place] Gastrointestinal: abdomen is soft with + BS Skin: No rashes, normal turgor Neurologic: very hard of hearing, moves all 4 ext to command, profoundly weak, has been unable to transfer for past month due to combination of weakness and breathlessness Psychiatric: poor insight but AOx3  Labs notable for mild hyponatremia, low albumin, iron deficiency anemia, chronic compensated respiratory acidemia  PFTs 2018 showing ratio 0.47, FEV1 1.1L (45% pred), DLCO 43% pred  Resolved Hospital Problem list   N/A  Assessment & Plan:  Assessment -Acute hypoxemic respiratory failure due to large left pleural effusion and question of endobronchial debris vs. Obstruction; insidious onset worrisome for malignancy -Iron deficiency anemia of unclear origin -Abnormal chest imaging in smoker -COPD -Hx of VTE on warfarin with subtherapeutic INR,  CTA no PE  Plan - Drain left pleural space, send for usual studies - If incomplete expansion of left lung will need to consider bronch - Heparin gtt fine for now - IS, flutter, nebs, wean O2 as able - Transfusion goals and further workup of anemia per primary  Labs   CBC: Recent Labs  Lab 07/10/20 1759 07/10/20 2132 07/11/20 0226  WBC 6.8  --  10.4  NEUTROABS 4.1  --   --   HGB 6.5* 8.8* 7.6*   HCT 26.8* 26.0* 28.7*  MCV 63.4*  --  67.1*  PLT 607*  --  429*    Basic Metabolic Panel: Recent Labs  Lab 07/10/20 1759 07/10/20 2132 07/11/20 0226  NA 132* 130* 131*  K 3.3* 3.7 3.7  CL 90*  --  90*  CO2 30  --  32  GLUCOSE 168*  --  146*  BUN <5*  --  5*  CREATININE 0.52  --  0.50  CALCIUM 8.6*  --  8.6*   GFR: Estimated Creatinine Clearance: 72.4 mL/min (by C-G formula based on SCr of 0.5 mg/dL). Recent Labs  Lab 07/10/20 1756 07/10/20 1759 07/11/20 0226  WBC  --  6.8 10.4  LATICACIDVEN 1.8  --   --     Liver Function Tests: Recent Labs  Lab 07/10/20 1759 07/11/20 0226  AST 13* 11*  ALT 8 8  ALKPHOS 56 60  BILITOT 0.4 0.5  PROT 6.6 6.6  ALBUMIN 2.7* 2.7*   No results for input(s): LIPASE, AMYLASE in the last 168 hours. No results for input(s): AMMONIA in the last 168 hours.  ABG    Component Value Date/Time   PHART 7.404 07/10/2020 2132   PCO2ART 56.5 (H) 07/10/2020 2132   PO2ART 89 07/10/2020 2132   HCO3 35.3 (H) 07/10/2020 2132   TCO2 37 (H) 07/10/2020 2132   O2SAT 97.0 07/10/2020 2132     Coagulation Profile: Recent Labs  Lab 07/10/20 1816 07/11/20 0226  INR 1.1 1.1    Cardiac Enzymes: No results for input(s): CKTOTAL, CKMB, CKMBINDEX, TROPONINI in the last 168 hours.  HbA1C: Hgb A1c MFr Bld  Date/Time Value Ref Range Status  08/14/2017 01:42 PM 6.0 4.6 - 6.5 % Final    Comment:    Glycemic Control Guidelines for People with Diabetes:Non Diabetic:  <6%Goal of Therapy: <7%Additional Action Suggested:  >8%   01/18/2017 07:20 PM 6.1 (H) 4.8 - 5.6 % Final    Comment:    (NOTE)         Pre-diabetes: 5.7 - 6.4         Diabetes: >6.4         Glycemic control for adults with diabetes: <7.0     CBG: Recent Labs  Lab 07/10/20 1835 07/11/20 0019  GLUCAP 156* 169*    Review of Systems:    Positive Symptoms in bold:  Constitutional fevers, chills, weight loss, fatigue, anorexia, malaise  Eyes decreased vision, double vision,  eye irritation  Ears, Nose, Mouth, Throat sore throat, trouble swallowing, sinus congestion  Cardiovascular chest pain, paroxysmal nocturnal dyspnea, lower ext edema, palpitations   Respiratory SOB, cough, DOE, hemoptysis, wheezing  Gastrointestinal nausea, vomiting, diarrhea  Genitourinary burning with urination, trouble urinating  Musculoskeletal joint aches, joint swelling, back pain  Integumentary  rashes, skin lesions  Neurological focal weakness, focal numbness, trouble speaking, headaches  Psychiatric depression, anxiety, confusion  Endocrine polyuria, polydipsia, cold intolerance, heat intolerance  Hematologic abnormal bruising, abnormal bleeding, unexplained nose bleeds  Allergic/Immunologic recurrent infections, hives, swollen lymph nodes     Past Medical History  She,  has a past medical history of Anxiety, COPD (chronic obstructive pulmonary disease) ([place]), Depression, Diabetes mellitus without complication ([place]), Discoid lupus, Dyspnea, GERD (gastroesophageal reflux disease), History of colonic polyps, pulmonary embolism, Hypertension, Hypothyroidism, Rheumatoid arthritis(714.0), and Thyroid disease.   Surgical History    Past Surgical History:  Procedure Laterality Date  . ABDOMINAL HYSTERECTOMY    . arthroscopy right knee    . BREAST EXCISIONAL BIOPSY Right 2011  . carpal tunnel release left wrist    . 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 TONSILLECTOMY       Social History   reports that she quit smoking about 3 years ago. Her smoking use included cigarettes. She has a 40.00 pack-year smoking history. She has never used smokeless tobacco. She reports that she does not drink alcohol and does not use drugs.   Family History   Her family history includes Arthritis in an other family member; Cancer in an other family member; Coronary artery disease in an other family member; Hypertension in an other family member; Lymphoma in her son; Stroke in an other  family member.   Allergies Allergies  Allergen Reactions  . Effexor [Venlafaxine Hydrochloride] Rash     Home Medications  Prior to Admission medications   Medication Sig Start Date End Date Taking? Authorizing Provider  albuterol (PROAIR HFA) 108 (90 Base) MCG/ACT inhaler INHALE 2 PUFFS INTO THE LUNGS EVERY 4 (FOUR) HOURS AS NEEDED FOR WHEEZING. Patient taking differently: Inhale 2 puffs into the lungs every 4 (four) hours as needed for wheezing or shortness of breath.  01/02/20  Yes Laurey Morale, MD  amLODipine (NORVASC) 10 MG tablet TAKE 1 TABLET BY MOUTH EVERY DAY Patient taking differently: Take 10 mg by mouth at bedtime.  04/23/20  Yes Laurey Morale, MD  diphenhydrAMINE (BENADRYL ALLERGY) 25 MG tablet Take 25 mg by mouth every 6 (six) hours.    Yes [provider]  HYDROcodone-acetaminophen (NORCO) 10-325 MG tablet Take 1 tablet by mouth every 6 (six) hours as needed for moderate pain. Patient taking differently: Take 0.5 tablets by mouth 4 (four) times daily as needed (pain).  07/05/20 08/04/20 Yes Laurey Morale, MD  ipratropium-albuterol (DUONEB) 0.5-2.5 (3) MG/3ML SOLN TAKE 3 MLS BY NEBULIZATION EVERY 4 (FOUR) HOURS AS NEEDED. Patient taking differently: Take 3 mLs by nebulization 4 (four) times daily as needed (shortness of breath/wheezing).  08/27/19  Yes Laurey Morale, MD  losartan (COZAAR) 100 MG tablet TAKE 1 TABLET BY MOUTH EVERY DAY Patient taking differently: Take 100 mg by mouth at bedtime.  07/02/20  Yes Laurey Morale, MD  metFORMIN (GLUCOPHAGE) 500 MG tablet TAKE 1 TABLET BY MOUTH TWICE A DAY Patient taking differently: Take 500 mg by mouth 2 (two) times daily with a meal.  01/27/20  Yes Laurey Morale, MD  OXYGEN Inhale 5 L into the lungs. continuous   Yes [provider]  sertraline (ZOLOFT) 100 MG tablet TAKE 1 TABLET BY MOUTH EVERY DAY Patient taking differently: Take 100 mg by mouth at bedtime.  04/10/19  Yes Laurey Morale, MD  SYNTHROID 137 MCG  tablet TAKE 1 TABLET BY MOUTH EVERY DAY BEFORE BREAKFAST Patient taking differently: Take 137 mcg by mouth daily before breakfast.  04/23/20  Yes Laurey Morale, MD  warfarin (COUMADIN) 7.5 MG tablet Take 1 tablet daily except take 1/2 tablet on Wed  or AS DIRECTED BY ANTICOAGULATION CLINIC Patient taking differently: Take 3.75-7.5 mg by mouth See admin instructions. Take 1/2 tablet (3.75 mg) by mouth on Wednesday evening, take 1 tablet (7.5 mg) on all other evenings of the week 01/13/20  Yes Laurey Morale, MD  chlordiazePOXIDE (LIBRIUM) 5 MG capsule Take 1 capsule (5 mg total) by mouth 3 (three) times daily as needed for anxiety. Patient not taking: Reported on 07/10/2020 06/25/20   Laurey Morale, MD

## 2020-07-11 NOTE — Progress Notes (Signed)
PHARMACY - PHYSICIAN COMMUNICATION CRITICAL VALUE ALERT - BLOOD CULTURE IDENTIFICATION (BCID)  Results for orders placed or performed during the hospital encounter of 01/18/17  Blood Culture ID Panel (Reflexed) (Collected: 01/18/2017  3:07 PM)  Result Value Ref Range   Enterococcus species NOT DETECTED NOT DETECTED   Listeria monocytogenes NOT DETECTED NOT DETECTED   Staphylococcus species DETECTED (A) NOT DETECTED   Staphylococcus aureus (BCID) NOT DETECTED NOT DETECTED   Methicillin resistance DETECTED (A) NOT DETECTED   Streptococcus species NOT DETECTED NOT DETECTED   Streptococcus agalactiae NOT DETECTED NOT DETECTED   Streptococcus pneumoniae NOT DETECTED NOT DETECTED   Streptococcus pyogenes NOT DETECTED NOT DETECTED   Acinetobacter baumannii NOT DETECTED NOT DETECTED   Enterobacteriaceae species NOT DETECTED NOT DETECTED   Enterobacter cloacae complex NOT DETECTED NOT DETECTED   Escherichia coli NOT DETECTED NOT DETECTED   Klebsiella oxytoca NOT DETECTED NOT DETECTED   Klebsiella pneumoniae NOT DETECTED NOT DETECTED   Proteus species NOT DETECTED NOT DETECTED   Serratia marcescens NOT DETECTED NOT DETECTED   Haemophilus influenzae NOT DETECTED NOT DETECTED   Neisseria meningitidis NOT DETECTED NOT DETECTED   Pseudomonas aeruginosa NOT DETECTED NOT DETECTED   Candida albicans NOT DETECTED NOT DETECTED   Candida glabrata NOT DETECTED NOT DETECTED   Candida krusei NOT DETECTED NOT DETECTED   Candida parapsilosis NOT DETECTED NOT DETECTED   Candida tropicalis NOT DETECTED NOT DETECTED   Assessment:  1/4 blood cultures positive for staph epi. Contamination likely  Name of physician (or Provider) Contacted: Nicolette Bang  Changes to prescribed antibiotics required: No antibiotics indicated at this time  Norina Buzzard, PharmD PGY1 Pharmacy Resident 07/11/2020 12:38 PM

## 2020-07-11 NOTE — Progress Notes (Signed)
ANTICOAGULATION CONSULT NOTE - Initial Consult  Pharmacy Consult for heparin Indication: H/o PE/DVT  Allergies  Allergen Reactions  . Effexor [Venlafaxine Hydrochloride] Rash    Patient Measurements: Height: 5\' 2"  (157.5 cm) Weight: 97.5 kg (215 lb) IBW/kg (Calculated) : 50.1 Heparin Dosing Weight: 73.1 kg   Vital Signs: Temp: 98 F (36.7 C) (09/19 0628) Temp Source: Oral (09/19 0628) BP: 114/62 (09/19 0628) Pulse Rate: 77 (09/19 0628)  Labs: Recent Labs    07/10/20 1759 07/10/20 1759 07/10/20 1816 07/10/20 1935 07/10/20 2132 07/11/20 0226  HGB 6.5*   < >  --   --  8.8* 7.6*  HCT 26.8*  --   --   --  26.0* 28.7*  PLT 607*  --   --   --   --  429*  LABPROT  --   --  13.9  --   --  14.1  INR  --   --  1.1  --   --  1.1  HEPARINUNFRC  --   --   --   --   --  0.33  CREATININE 0.52  --   --   --   --  0.50  TROPONINIHS 4  --   --  5  --  4   < > = values in this interval not displayed.    Estimated Creatinine Clearance: 72.4 mL/min (by C-G formula based on SCr of 0.5 mg/dL).   Assessment: 27 YOF with h/o PE on warfarin here with chest pain. INR subtherapeutic at 1.1 on admission.  Pharmacy consulted to transition to heparin drip.   This morning heparin level is therapeutic at 0.33. H/H low, Plt high. Patient was anemic with Hgb <7 on admit, undergoing workup for anemia and s/p transfusion.   Goal of Therapy:  Heparin level 0.3-0.7 units/ml Monitor platelets by anticoagulation protocol: Yes   Plan:  -Continue heparin infusion at 1200 units/hr  -Monitor daily HL, CBC and s/s of bleeding   Norina Buzzard, PharmD PGY1 Pharmacy Resident 07/11/2020 7:08 AM

## 2020-07-12 ENCOUNTER — Encounter (HOSPITAL_COMMUNITY)
Admission: EM | Disposition: A | Discharge status: Skilled Nursing Facility | Payer: Self-pay | Source: Home / Self Care | Attending: Internal Medicine

## 2020-07-12 ENCOUNTER — Encounter (HOSPITAL_COMMUNITY): Payer: Self-pay | Admitting: Family Medicine

## 2020-07-12 ENCOUNTER — Inpatient Hospital Stay (HOSPITAL_COMMUNITY): Payer: Medicare Other | Admitting: Certified Registered"

## 2020-07-12 ENCOUNTER — Inpatient Hospital Stay (HOSPITAL_COMMUNITY): Payer: Medicare Other

## 2020-07-12 DIAGNOSIS — D509 Iron deficiency anemia, unspecified: Secondary | ICD-10-CM

## 2020-07-12 DIAGNOSIS — L89312 Pressure ulcer of right buttock, stage 2: Secondary | ICD-10-CM | POA: Diagnosis present

## 2020-07-12 DIAGNOSIS — E669 Obesity, unspecified: Secondary | ICD-10-CM | POA: Diagnosis present

## 2020-07-12 DIAGNOSIS — J9621 Acute and chronic respiratory failure with hypoxia: Secondary | ICD-10-CM

## 2020-07-12 DIAGNOSIS — T17500A Unspecified foreign body in bronchus causing asphyxiation, initial encounter: Secondary | ICD-10-CM | POA: Diagnosis present

## 2020-07-12 DIAGNOSIS — J9809 Other diseases of bronchus, not elsewhere classified: Secondary | ICD-10-CM

## 2020-07-12 HISTORY — PX: CHEST TUBE INSERTION: SHX231

## 2020-07-12 HISTORY — PX: VIDEO BRONCHOSCOPY: SHX5072

## 2020-07-12 LAB — CBC
HCT: 28.7 % — ABNORMAL LOW (ref 36.0–46.0)
Hemoglobin: 7.7 g/dL — ABNORMAL LOW (ref 12.0–15.0)
MCH: 18.3 pg — ABNORMAL LOW (ref 26.0–34.0)
MCHC: 26.8 g/dL — ABNORMAL LOW (ref 30.0–36.0)
MCV: 68.2 fL — ABNORMAL LOW (ref 80.0–100.0)
Platelets: 522 10*3/uL — ABNORMAL HIGH (ref 150–400)
RBC: 4.21 MIL/uL (ref 3.87–5.11)
RDW: 29.2 % — ABNORMAL HIGH (ref 11.5–15.5)
WBC: 10.9 10*3/uL — ABNORMAL HIGH (ref 4.0–10.5)
nRBC: 0 % (ref 0.0–0.2)

## 2020-07-12 LAB — FERRITIN: Ferritin: 20 ng/mL (ref 11–307)

## 2020-07-12 LAB — CYTOLOGY - NON PAP

## 2020-07-12 LAB — PH, BODY FLUID: pH, Body Fluid: 7.8

## 2020-07-12 SURGERY — INSERTION, PLEURAL DRAINAGE CATHETER
Anesthesia: General

## 2020-07-12 MED ORDER — INFLUENZA VAC A&B SA ADJ QUAD 0.5 ML IM PRSY: 0.5000 mL | PREFILLED_SYRINGE | INTRAMUSCULAR | Status: DC

## 2020-07-12 MED ORDER — PHENYLEPHRINE HCL-NACL 10-0.9 MG/250ML-% IV SOLN
INTRAVENOUS | Status: DC | PRN
  Administered 2020-07-12: 40 ug/min via INTRAVENOUS

## 2020-07-12 MED ORDER — ONDANSETRON HCL 4 MG/2ML IJ SOLN
INTRAMUSCULAR | Status: DC | PRN
  Administered 2020-07-12: 4 mg via INTRAVENOUS

## 2020-07-12 MED ORDER — SUGAMMADEX SODIUM 200 MG/2ML IV SOLN
INTRAVENOUS | Status: DC | PRN
  Administered 2020-07-12: 160 mg via INTRAVENOUS

## 2020-07-12 MED ORDER — EPHEDRINE SULFATE 50 MG/ML IJ SOLN
INTRAMUSCULAR | Status: DC | PRN
  Administered 2020-07-12: 15 mg via INTRAVENOUS

## 2020-07-12 MED ORDER — ALBUMIN HUMAN 5 % IV SOLN: INTRAVENOUS | Status: DC | PRN

## 2020-07-12 MED ORDER — IPRATROPIUM-ALBUTEROL 0.5-2.5 (3) MG/3ML IN SOLN
3.0000 mL | Freq: Three times a day (TID) | RESPIRATORY_TRACT | Status: DC
  Administered 2020-07-12 – 2020-07-15 (×9): 3 mL via RESPIRATORY_TRACT
  Filled 2020-07-12 (×9): qty 3

## 2020-07-12 MED ORDER — PHENYLEPHRINE 40 MCG/ML (10ML) SYRINGE FOR IV PUSH (FOR BLOOD PRESSURE SUPPORT)
PREFILLED_SYRINGE | INTRAVENOUS | Status: DC | PRN
  Administered 2020-07-12: 160 ug via INTRAVENOUS
  Administered 2020-07-12: 120 ug via INTRAVENOUS

## 2020-07-12 MED ORDER — LACTATED RINGERS IV SOLN: INTRAVENOUS | Status: DC | PRN

## 2020-07-12 MED ORDER — DEXAMETHASONE SODIUM PHOSPHATE 10 MG/ML IJ SOLN
INTRAMUSCULAR | Status: DC | PRN
  Administered 2020-07-12: 10 mg via INTRAVENOUS

## 2020-07-12 MED ORDER — LIDOCAINE 2% (20 MG/ML) 5 ML SYRINGE
INTRAMUSCULAR | Status: DC | PRN
  Administered 2020-07-12: 60 mg via INTRAVENOUS

## 2020-07-12 MED ORDER — PROPOFOL 10 MG/ML IV BOLUS
INTRAVENOUS | Status: DC | PRN
  Administered 2020-07-12: 100 mg via INTRAVENOUS

## 2020-07-12 MED ORDER — FENTANYL CITRATE (PF) 100 MCG/2ML IJ SOLN
INTRAMUSCULAR | Status: DC | PRN
Start: 2020-07-12 — End: 2020-07-12
  Administered 2020-07-12: 50 ug via INTRAVENOUS

## 2020-07-12 MED ORDER — ROCURONIUM BROMIDE 10 MG/ML (PF) SYRINGE
PREFILLED_SYRINGE | INTRAVENOUS | Status: DC | PRN
  Administered 2020-07-12: 50 mg via INTRAVENOUS

## 2020-07-12 NOTE — Op Note (Signed)
Insertion of Chest Tube Procedure Note  Jaime Kelley  [id number]  Sep 14, 1951  Date:07/12/20  Time:2:15 PM   Provider Performing: Jaime Kelley   Procedure: Pleural Catheter Insertion w/ Imaging Guidance [id number])  Indication(s) Effusion  Consent Risks of the procedure as well as the alternatives and risks of each were explained to the patient and/or caregiver.  Consent for the procedure was obtained and is signed in the bedside chart  Anesthesia Topical only with 1% lidocaine   Time Out Verified patient identification, verified procedure, site/side was marked, verified correct patient position, special equipment/implants available, medications/allergies/relevant history reviewed, required imaging and test results available.  Sterile Technique Maximal sterile technique including full sterile barrier drape, hand hygiene, sterile gown, sterile gloves, mask, hair covering, sterile ultrasound probe cover (if used).  Procedure Description Ultrasound used to identify appropriate pleural anatomy for placement and overlying skin marked. Area of placement cleaned and draped in sterile fashion.  A 14 French pigtail pleural catheter was placed into the left pleural space using Seldinger technique. Appropriate return of fluid was obtained.  The tube was connected to atrium and placed on -20 cm H2O wall suction.  Complications/Tolerance None; patient tolerated the procedure well. Chest X-ray is ordered to verify placement.  EBL Minimal  Specimen(s) none   Jaime Nash, DO [facility] 07/12/2020 2:16 PM

## 2020-07-12 NOTE — Anesthesia Preprocedure Evaluation (Signed)
Anesthesia Evaluation  Patient identified by MRN, date of birth, ID band Patient awake    Reviewed: Allergy & Precautions, H&P , NPO status , Patient's Chart, lab work & pertinent test results  Airway Mallampati: II   Neck ROM: full    Dental   Pulmonary shortness of breath, COPD, former smoker,    breath sounds clear to auscultation       Cardiovascular hypertension,  Rhythm:regular Rate:Normal     Neuro/Psych PSYCHIATRIC DISORDERS Anxiety Depression    GI/Hepatic GERD  ,  Endo/Other  diabetes, Type 2Hypothyroidism   Renal/GU      Musculoskeletal  (+) Arthritis ,   Abdominal   Peds  Hematology  (+) Blood dyscrasia, anemia ,   Anesthesia Other Findings   Reproductive/Obstetrics                             Anesthesia Physical Anesthesia Plan  ASA: III  Anesthesia Plan: General   Post-op Pain Management:    Induction: Intravenous  PONV Risk Score and Plan: 3 and Ondansetron, Dexamethasone and Treatment may vary due to age or medical condition  Airway Management Planned: Oral ETT  Additional Equipment:   Intra-op Plan:   Post-operative Plan: Extubation in OR  Informed Consent: I have reviewed the patients History and Physical, chart, labs and discussed the procedure including the risks, benefits and alternatives for the proposed anesthesia with the patient or authorized representative who has indicated his/her understanding and acceptance.       Plan Discussed with: CRNA, Anesthesiologist and Surgeon  Anesthesia Plan Comments:         Anesthesia Quick Evaluation

## 2020-07-12 NOTE — Progress Notes (Addendum)
PROGRESS NOTE  Jaime Kelley HYI:[id number] DOB: Jan 01, 1951 DOA: 07/10/2020 PCP: Laurey Morale, MD  HPI/Recap of past 98 hours: 69 year old female with past medical history of obesity, COPD with chronic respiratory failure on 5 L nasal cannula, diabetes mellitus, rheumatoid arthritis, hypertension and PE on chronic Coumadin who presented to the emergency room with shortness of breath on 9/18.  Patient found to be anemic with a hemoglobin of 6.8, was at 13 3 years ago on her chart, subtherapeutic INR of 1.1 and a large left-sided pleural effusion causing near complete collapse of her left lung and requiring a nonrebreather to keep her oxygen saturations greater than 90%.  Pulmonary consulted for thoracentesis and patient was admitted to the hospitalist service.  Patient underwent thoracentesis on 9/19 with approximately 1300 cc of fluid removed.  Breathing has since improved post procedure x-rays noted persistent complete opacification of left hemithorax.  Pulmonary plans to do bronchoscopy and placement of pigtail catheter today, 9/20.  Patient seen this morning before procedure.  No complaints, feels like breathing is a little bit better, fatigued.  Assessment/Plan: Principal Problem: COPD Gold with chronic respiratory failure on 5 L nasal cannula with large pleural effusion on left causing collapsed lung.  Status post thoracentesis.  Persistent collapse and so bronchoscopy and pigtail catheter to follow.  Continue supplemental oxygen and incentive spirometer.  Breathing is actually since improved and currently patient is actually on 4 L nasal cannula.  Noted blood cultures grew out 1 out of 4 for staph epi, likely contamination. Active Problems:    Type 2 diabetes mellitus ([place]): Following CBGs, staying under 180.   Anticoagulated on Coumadin: INR subtherapeutic on admission.  Holding Coumadin until procedures completed.  Hypothyroidism: Continue Synthroid.  microcytic anemia: No evidence  of bleeding.  Hemoglobin on admission at 6.5 with an MCV of 63.  Status post transfusion and hemoglobin today at 7.7.  Note drop from previous day at 8.8 although they admitted from hemoconcentration.  Continue to monitor closely.  Checking ferritin level although low iron and equivalent TIBC does make this likely chronic disease.  Obesity: Patient meets criteria with BMI greater than 30  Essential hypertension: Continue Norvasc  Depression: Continue Zoloft    Thrombocytosis ([place]): In the setting of pleural effusion   Generalized weakness   Pressure injury of skin   Code Status: Full code  Family Communication: Left message for son  Disposition Plan: Seen by PT recommending skilled nursing   Consultants:  Pulmonary  Procedures:  Status post thoracentesis on 9/19 with 1300 cc fluid removed  Pending bronchoscopy and pigtail catheter placement  Antimicrobials:  None  DVT prophylaxis: Anticoagulation on hold until post procedure.  SCDs.   Objective: Vitals:   07/12/20 0752 07/12/20 1156  BP:  (!) 99/53  Pulse: 74 73  Resp: 18 16  Temp:  98.4 F (36.9 C)  SpO2: 99% 97%    Intake/Output Summary (Last 24 hours) at 07/12/2020 1220 Last data filed at 07/11/2020 1700 Gross per 24 hour  Intake 480 ml  Output 200 ml  Net 280 ml   Filed Weights   07/10/20 2119 07/12/20 0535  Weight: 97.5 kg 78.9 kg   Body mass index is 31.83 kg/m.  Exam:   General: Alert and oriented x3, fatigued, no acute distress  HEENT: Normocephalic and atraumatic, mucous membranes are moist  Neck: Thick, narrow airway  Cardiovascular: Regular rate and rhythm, S1-S2  Respiratory: Decreased breath sounds left side  Abdomen: Soft, obese, nontender, positive bowel  sounds  Musculoskeletal: No clubbing or cyanosis, trace pitting edema  Skin: No skin breaks, tears or lesions  Neuro: No focal deficits  Psychiatry: Appropriate, no evidence of psychoses   Data Reviewed: CBC: Recent  Labs  Lab 07/10/20 1759 07/10/20 2132 07/11/20 0226 07/12/20 0611  WBC 6.8  --  10.4 10.9*  NEUTROABS 4.1  --   --   --   HGB 6.5* 8.8* 7.6* 7.7*  HCT 26.8* 26.0* 28.7* 28.7*  MCV 63.4*  --  67.1* 68.2*  PLT 607*  --  429* 536*   Basic Metabolic Panel: Recent Labs  Lab 07/10/20 1759 07/10/20 2132 07/11/20 0226  NA 132* 130* 131*  K 3.3* 3.7 3.7  CL 90*  --  90*  CO2 30  --  32  GLUCOSE 168*  --  146*  BUN <5*  --  5*  CREATININE 0.52  --  0.50  CALCIUM 8.6*  --  8.6*   GFR: Estimated Creatinine Clearance: 64.5 mL/min (by C-G formula based on SCr of 0.5 mg/dL). Liver Function Tests: Recent Labs  Lab 07/10/20 1759 07/11/20 0226  AST 13* 11*  ALT 8 8  ALKPHOS 56 60  BILITOT 0.4 0.5  PROT 6.6 6.6  ALBUMIN 2.7* 2.7*   No results for input(s): LIPASE, AMYLASE in the last 168 hours. No results for input(s): AMMONIA in the last 168 hours. Coagulation Profile: Recent Labs  Lab 07/10/20 1816 07/11/20 0226  INR 1.1 1.1   Cardiac Enzymes: No results for input(s): CKTOTAL, CKMB, CKMBINDEX, TROPONINI in the last 168 hours. BNP (last 3 results) No results for input(s): PROBNP in the last 8760 hours. HbA1C: No results for input(s): HGBA1C in the last 72 hours. CBG: Recent Labs  Lab 07/10/20 1835 07/11/20 0019 07/11/20 1129  GLUCAP 156* 169* 117*   Lipid Profile: No results for input(s): CHOL, HDL, LDLCALC, TRIG, CHOLHDL, LDLDIRECT in the last 72 hours. Thyroid Function Tests: No results for input(s): TSH, T4TOTAL, FREET4, T3FREE, THYROIDAB in the last 72 hours. Anemia Panel: Recent Labs    07/10/20 2146  TIBC 420  IRON 10*   Urine analysis:    Component Value Date/Time   COLORURINE YELLOW 01/18/2017 1451   APPEARANCEUR CLEAR 01/18/2017 1451   LABSPEC 1.008 01/18/2017 1451   PHURINE 6.0 01/18/2017 1451   GLUCOSEU NEGATIVE 01/18/2017 1451   HGBUR SMALL (A) 01/18/2017 1451   BILIRUBINUR NEGATIVE 01/18/2017 1451   BILIRUBINUR n 02/03/2015 1507    KETONESUR NEGATIVE 01/18/2017 1451   PROTEINUR NEGATIVE 01/18/2017 1451   UROBILINOGEN 0.2 02/03/2015 1507   NITRITE NEGATIVE 01/18/2017 1451   LEUKOCYTESUR LARGE (A) 01/18/2017 1451   Sepsis Labs: @LABRCNTIP (procalcitonin:4,lacticidven:4)  ) Recent Results (from the past 240 hour(s))  SARS Coronavirus 2 by RT PCR (hospital order, performed in [place] hospital lab) Nasopharyngeal Nasopharyngeal Swab     Status: None   Collection Time: 07/10/20  5:35 PM   Specimen: Nasopharyngeal Swab  Result Value Ref Range Status   SARS Coronavirus 2 NEGATIVE NEGATIVE Final    Comment: (NOTE) SARS-CoV-2 target nucleic acids are NOT DETECTED.  The SARS-CoV-2 RNA is generally detectable in upper and lower respiratory specimens during the acute phase of infection. The lowest concentration of SARS-CoV-2 viral copies this assay can detect is 250 copies / mL. A negative result does not preclude SARS-CoV-2 infection and should not be used as the sole basis for treatment or other patient management decisions.  A negative result may occur with improper specimen collection / handling,  submission of specimen other than nasopharyngeal swab, presence of viral mutation(s) within the areas targeted by this assay, and inadequate number of viral copies (<250 copies / mL). A negative result must be combined with clinical observations, patient history, and epidemiological information.  Fact Sheet for Patients:   StrictlyIdeas.no  Fact Sheet for Healthcare Providers: BankingDealers.co.za  This test is not yet approved or  cleared by the [place] FDA and has been authorized for detection and/or diagnosis of SARS-CoV-2 by FDA under an Emergency Use Authorization (EUA).  This EUA will remain in effect (meaning this test can be used) for the duration of the COVID-19 declaration under Section 564(b)(1) of the Act, 21 U.S.C. section 360bbb-3(b)(1), unless the  authorization is terminated or revoked sooner.  Performed at [facility], [place] [address], [place], [place] [address]   Culture, blood (routine x 2)     Status: Abnormal (Preliminary result)   Collection Time: 07/10/20  5:36 PM   Specimen: BLOOD  Result Value Ref Range Status   Specimen Description BLOOD LEFT ANTECUBITAL  Final   Special Requests   Final    BOTTLES DRAWN AEROBIC AND ANAEROBIC Blood Culture adequate volume   Culture  Setup Time   Final    GRAM POSITIVE COCCI IN CLUSTERS IN BOTH AEROBIC AND ANAEROBIC BOTTLES CRITICAL RESULT CALLED TO, READ BACK BY AND VERIFIED WITH: PHARMD K PIERCE [id number] FCP    Culture (A)  Final    STAPHYLOCOCCUS EPIDERMIDIS THE SIGNIFICANCE OF ISOLATING THIS ORGANISM FROM A SINGLE SET OF BLOOD CULTURES WHEN MULTIPLE SETS ARE DRAWN IS UNCERTAIN. PLEASE NOTIFY THE MICROBIOLOGY DEPARTMENT WITHIN ONE WEEK IF SPECIATION AND SENSITIVITIES ARE REQUIRED. Performed at [facility], [place] [address], [place],  [address]    Report Status PENDING  Incomplete  Blood Culture ID Panel (Reflexed)     Status: Abnormal   Collection Time: 07/10/20  5:36 PM  Result Value Ref Range Status   Enterococcus faecalis NOT DETECTED NOT DETECTED Final   Enterococcus Faecium NOT DETECTED NOT DETECTED Final   Listeria monocytogenes NOT DETECTED NOT DETECTED Final   Staphylococcus species DETECTED (A) NOT DETECTED Final    Comment: CRITICAL RESULT CALLED TO, READ BACK BY AND VERIFIED WITH: PHARMD K PIERCE [id number] FCP    Staphylococcus aureus (BCID) NOT DETECTED NOT DETECTED Final   Staphylococcus epidermidis DETECTED (A) NOT DETECTED Final    Comment: CRITICAL RESULT CALLED TO, READ BACK BY AND VERIFIED WITH: PHARMD K PIERCE [id number] FCP    Staphylococcus lugdunensis NOT DETECTED NOT DETECTED Final   Streptococcus species NOT DETECTED NOT DETECTED Final   Streptococcus agalactiae NOT DETECTED NOT DETECTED Final   Streptococcus pneumoniae NOT  DETECTED NOT DETECTED Final   Streptococcus pyogenes NOT DETECTED NOT DETECTED Final   A.calcoaceticus-baumannii NOT DETECTED NOT DETECTED Final   Bacteroides fragilis NOT DETECTED NOT DETECTED Final   Enterobacterales NOT DETECTED NOT DETECTED Final   Enterobacter cloacae complex NOT DETECTED NOT DETECTED Final   Escherichia coli NOT DETECTED NOT DETECTED Final   Klebsiella aerogenes NOT DETECTED NOT DETECTED Final   Klebsiella oxytoca NOT DETECTED NOT DETECTED Final   Klebsiella pneumoniae NOT DETECTED NOT DETECTED Final   Proteus species NOT DETECTED NOT DETECTED Final   Salmonella species NOT DETECTED NOT DETECTED Final   Serratia marcescens NOT DETECTED NOT DETECTED Final   Haemophilus influenzae NOT DETECTED NOT DETECTED Final   Neisseria meningitidis NOT DETECTED NOT DETECTED Final   Pseudomonas aeruginosa NOT  DETECTED NOT DETECTED Final   Stenotrophomonas maltophilia NOT DETECTED NOT DETECTED Final   Candida albicans NOT DETECTED NOT DETECTED Final   Candida auris NOT DETECTED NOT DETECTED Final   Candida glabrata NOT DETECTED NOT DETECTED Final   Candida krusei NOT DETECTED NOT DETECTED Final   Candida parapsilosis NOT DETECTED NOT DETECTED Final   Candida tropicalis NOT DETECTED NOT DETECTED Final   Cryptococcus neoformans/gattii NOT DETECTED NOT DETECTED Final   Methicillin resistance mecA/C NOT DETECTED NOT DETECTED Final    Comment: Performed at [facility], [place] [address] [place], [place] [address]  Culture, blood (routine x 2)     Status: None (Preliminary result)   Collection Time: 07/10/20  6:47 PM   Specimen: BLOOD RIGHT FOREARM  Result Value Ref Range Status   Specimen Description BLOOD RIGHT FOREARM  Final   Special Requests   Final    BOTTLES DRAWN AEROBIC AND ANAEROBIC Blood Culture adequate volume   Culture   Final    NO GROWTH < 24 HOURS Performed at [facility], [place] [address], [place], [place] [address]    Report Status PENDING  Incomplete   Body fluid culture (includes gram stain)     Status: None (Preliminary result)   Collection Time: 07/11/20  9:30 AM   Specimen: Pleural Fluid  Result Value Ref Range Status   Specimen Description PLEURAL FLUID  Final   Special Requests LEFT  Final   Gram Stain   Final    WBC PRESENT,BOTH PMN AND MONONUCLEAR NO ORGANISMS SEEN CYTOSPIN SMEAR    Culture   Final    NO GROWTH 1 DAY Performed at [facility], [place] [address] [place], [place] [address]    Report Status PENDING  Incomplete      Studies: No results found.  Scheduled Meds: . amLODipine  10 mg Oral QHS  . ipratropium-albuterol  3 mL Nebulization TID  . levothyroxine  137 mcg Oral Q0600  . losartan  100 mg Oral QHS  . sertraline  100 mg Oral QHS    Continuous Infusions:   LOS: 2 days     Annita Brod, MD [facility]   07/12/2020, 12:20 PM

## 2020-07-12 NOTE — Anesthesia Postprocedure Evaluation (Signed)
Anesthesia Post Note  Patient: LATECIA MILER  Procedure(s) Performed: INSERTION PLEURAL DRAINAGE CATHETER (Left ) VIDEO BRONCHOSCOPY WITHOUT FLUORO (N/A )     Patient location during evaluation: PACU Anesthesia Type: General Level of consciousness: awake and alert Pain management: pain level controlled Vital Signs Assessment: post-procedure vital signs reviewed and stable Respiratory status: spontaneous breathing, nonlabored ventilation, respiratory function stable and patient connected to nasal cannula oxygen Cardiovascular status: blood pressure returned to baseline and stable Postop Assessment: no apparent nausea or vomiting Anesthetic complications: no   No complications documented.  Last Vitals:  Vitals:   07/12/20 1539 07/12/20 1615  BP: (!) 95/49 (!) 89/55  Pulse: 72 76  Resp: 14 18  Temp:  36.7 C  SpO2: 96% 96%    Last Pain:  Vitals:   07/12/20 1615  TempSrc: Oral  PainSc:                  [place]

## 2020-07-12 NOTE — Plan of Care (Signed)
°  Problem: Coping: °Goal: Level of anxiety will decrease °Outcome: Progressing °  °

## 2020-07-12 NOTE — Anesthesia Procedure Notes (Signed)
Procedure Name: Intubation Date/Time: 07/12/2020 1:24 PM Performed by: Imagene Riches, CRNA Pre-anesthesia Checklist: Patient identified, Emergency Drugs available, Suction available and Patient being monitored Patient Re-evaluated:Patient Re-evaluated prior to induction Oxygen Delivery Method: Circle System Utilized Preoxygenation: Pre-oxygenation with 100% oxygen Induction Type: IV induction Ventilation: Mask ventilation without difficulty Laryngoscope Size: Mac and 3 Grade View: Grade I Tube type: Oral Tube size: 8.5 mm Number of attempts: 1 Airway Equipment and Method: Stylet and Oral airway Placement Confirmation: ETT inserted through vocal cords under direct vision,  positive ETCO2 and breath sounds checked- equal and bilateral Secured at: 21 cm Tube secured with: Tape Dental Injury: Teeth and Oropharynx as per pre-operative assessment

## 2020-07-12 NOTE — Interval H&P Note (Signed)
History and Physical Interval Note:  07/12/2020 1:12 PM  Jaime Kelley  has presented today for surgery, with the diagnosis of Endobronchial obstruction, Left pleural effusion.  The various methods of treatment have been discussed with the patient and family. After consideration of risks, benefits and other options for treatment, the patient has consented to  Procedure(s): INSERTION PLEURAL DRAINAGE CATHETER (Left) VIDEO BRONCHOSCOPY WITHOUT FLUORO (N/A) as a surgical intervention.  The patient's history has been reviewed, patient examined, no change in status, stable for surgery.  I have reviewed the patient's chart and labs.  Questions were answered to the patient's satisfaction.    Patient seen in pre-op. Discussed risks, benefits and alternatives of procedure to include bleeding, pneumothorax and death. The patient is agreeable to proceed.   [place]

## 2020-07-12 NOTE — Transfer of Care (Signed)
Immediate Anesthesia Transfer of Care Note  Patient: Jaime Kelley  Procedure(s) Performed: INSERTION PLEURAL DRAINAGE CATHETER (Left ) VIDEO BRONCHOSCOPY WITHOUT FLUORO (N/A )  Patient Location: PACU  Anesthesia Type:General  Level of Consciousness: drowsy  Airway & Oxygen Therapy: Patient Spontanous Breathing and Patient connected to nasal cannula oxygen  Post-op Assessment: Report given to RN and Post -op Vital signs reviewed and stable  Post vital signs: Reviewed and stable  Last Vitals:  Vitals Value Taken Time  BP 115/47 07/12/20 1419  Temp    Pulse 66 07/12/20 1421  Resp 15 07/12/20 1421  SpO2 100 % 07/12/20 1421  Vitals shown include unvalidated device data.  Last Pain:  Vitals:   07/12/20 1257  TempSrc: Tympanic  PainSc: 0-No pain         Complications: No complications documented.

## 2020-07-12 NOTE — Progress Notes (Signed)
Initial Nutrition Assessment  DOCUMENTATION CODES:   Obesity unspecified  INTERVENTION:   -RD will follow for diet advancement and add supplements as appropriate  NUTRITION DIAGNOSIS:   Inadequate oral intake related to inability to eat as evidenced by NPO status.  GOAL:   Patient will meet greater than or equal to 90% of their needs  MONITOR:   Diet advancement, Labs, Weight trends, Skin, I & O's  REASON FOR ASSESSMENT:   Low Braden    ASSESSMENT:   Jaime Kelley is a 69 y.o. 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  Pt admitted with lt pleural effusion.   9/19- s/p thoracentesis (1300 ml straw clear fluid removed from lt pleural site)  Reviewed I/O's: +280 ml x 24 hours and +1.1 L since admission  UOP: 200 ml x 24 hours  Per PCCM notes, probably needs airway inspection and pigtail.   Spoke with pt at bedside, however, did not answer RD questions. Noted pt is currently NPO, but was previously only consuming 25% of meals.   Reviewed wt hx; noted distant hx of weight loss, however, no recent wts to assess more recent trends.   Lab Results  Component Value Date   HGBA1C 6.0 08/14/2017   PTA DM medications are 500 mg metformin daily.   Labs reviewed: CBGS: 177 (inpatient orders for glycemic control are none).   NUTRITION - FOCUSED PHYSICAL EXAM:    Most Recent Value  Orbital Region No depletion  Upper Arm Region No depletion  Thoracic and Lumbar Region No depletion  Buccal Region No depletion  Temple Region No depletion  Clavicle Bone Region No depletion  Clavicle and Acromion Bone Region No depletion  Scapular Bone Region No depletion  Dorsal Hand No depletion  Patellar Region No depletion  Anterior Thigh Region No depletion  Posterior Calf Region  No depletion  Edema (RD Assessment) Mild  Hair Reviewed  Eyes Reviewed  Mouth Reviewed  Skin Reviewed  Nails Reviewed       Diet Order:   Diet Order            Diet NPO time specified  Diet effective midnight                 EDUCATION NEEDS:   Not appropriate for education at this time  Skin:  Skin Assessment: Skin Integrity Issues: Skin Integrity Issues:: Stage II Stage II: lt buttocks  Last BM:  07/10/20  Height:   Ht Readings from Last 1 Encounters:  07/12/20 5\' 2"  (1.575 m)    Weight:   Wt Readings from Last 1 Encounters:  07/12/20 78.9 kg    Ideal Body Weight:  50 kg  BMI:  Body mass index is 31.81 kg/m.  Estimated Nutritional Needs:   Kcal:  1800-2000  Protein:  100-115 grams  Fluid:  > 1.8 L    Loistine Chance, RD, LDN, [place] Registered Dietitian II Certified Diabetes Care and Education Specialist Please refer to [facility] for RD and/or RD on-call/weekend/after hours pager

## 2020-07-12 NOTE — Op Note (Signed)
Video Bronchoscopy Procedure Note  Date of Operation: 07/12/2020  Pre-op Diagnosis: endobronchial obstruction   Post-op Diagnosis: mucus plugging   Surgeon: Garner Nash, DO   Assistants: none  Anesthesia: general   Meds Given: per anesthesia record   Operation: Flexible video fiberoptic bronchoscopy and biopsies.  Estimated Blood Loss: <1 cc   Complications: none noted  Indications and History: Jaime Kelley is 69 y.o. with history of tobacco abuse, large effusion.  Recommendation was to perform video fiberoptic bronchoscopy with biopsies. The risks, benefits, complications, treatment options and expected outcomes were discussed with the patient.  The possibilities of pneumothorax, pneumonia, reaction to medication, pulmonary aspiration, perforation of a viscus, bleeding, failure to diagnose a condition and creating a complication requiring transfusion or operation were discussed with the patient who freely signed the consent.    Description of Procedure: The patient was seen in the Preoperative Area, was examined and was deemed appropriate to proceed.  The patient was taken to [facility], identified as Jaime Kelley and the procedure verified as Flexible Video Fiberoptic Bronchoscopy.  A Time Out was held and the above information confirmed.   The bronchoscope was introduced through the ETT. The right lung was patent with normal anatomy. There was a thick near solidified mucous plug within the left mainstem that was suctioned to the therapeutic bronchoscope working channel and retracted en bloc.  The remaining left upper lobe and lower lobe subsegments were suctioned clear of thick tenacious gray secretions.  Saline was used for bronchial lavage and clearance of the left mainstem secretions.  There was no evidence of active bleeding.  At the termination of the procedure all distal subsegments of the right and left lung were patent with no evidence of endobronchial disease or  tumor.  Samples: None    Garner Nash, DO [facility] 07/12/2020 2:08 PM

## 2020-07-13 ENCOUNTER — Inpatient Hospital Stay (HOSPITAL_COMMUNITY): Payer: Medicare Other

## 2020-07-13 ENCOUNTER — Encounter: Payer: Self-pay | Admitting: Family Medicine

## 2020-07-13 DIAGNOSIS — I10 Essential (primary) hypertension: Secondary | ICD-10-CM

## 2020-07-13 DIAGNOSIS — J9819 Other pulmonary collapse: Secondary | ICD-10-CM

## 2020-07-13 LAB — TRIGLYCERIDES, BODY FLUIDS: Triglycerides, Fluid: 43 mg/dL

## 2020-07-13 LAB — CULTURE, BLOOD (ROUTINE X 2): Special Requests: ADEQUATE

## 2020-07-13 LAB — BASIC METABOLIC PANEL
Anion gap: 9 (ref 5–15)
BUN: 9 mg/dL (ref 8–23)
CO2: 30 mmol/L (ref 22–32)
Calcium: 8.5 mg/dL — ABNORMAL LOW (ref 8.9–10.3)
Chloride: 91 mmol/L — ABNORMAL LOW (ref 98–111)
Creatinine, Ser: 0.46 mg/dL (ref 0.44–1.00)
GFR calc Af Amer: >60 mL/min (ref >60)
GFR calc non Af Amer: >60 mL/min (ref >60)
Glucose, Bld: 89 mg/dL (ref 70–99)
Potassium: 4 mmol/L (ref 3.5–5.1)
Sodium: 130 mmol/L — ABNORMAL LOW (ref 135–145)

## 2020-07-13 LAB — CBC
HCT: 28.6 % — ABNORMAL LOW (ref 36.0–46.0)
Hemoglobin: 7.8 g/dL — ABNORMAL LOW (ref 12.0–15.0)
MCH: 18.3 pg — ABNORMAL LOW (ref 26.0–34.0)
MCHC: 27.3 g/dL — ABNORMAL LOW (ref 30.0–36.0)
MCV: 67.1 fL — ABNORMAL LOW (ref 80.0–100.0)
Platelets: 585 10*3/uL — ABNORMAL HIGH (ref 150–400)
RBC: 4.26 MIL/uL (ref 3.87–5.11)
RDW: 29.2 % — ABNORMAL HIGH (ref 11.5–15.5)
WBC: 12.5 10*3/uL — ABNORMAL HIGH (ref 4.0–10.5)
nRBC: 0 % (ref 0.0–0.2)

## 2020-07-13 MED ORDER — ENOXAPARIN SODIUM 40 MG/0.4ML ~~LOC~~ SOLN
40.0000 mg | Freq: Every day | SUBCUTANEOUS | Status: DC
  Administered 2020-07-13 – 2020-07-18 (×6): 40 mg via SUBCUTANEOUS
  Filled 2020-07-13 (×7): qty 0.4

## 2020-07-13 MED ORDER — WARFARIN - PHARMACIST DOSING INPATIENT: Freq: Every day | Status: DC

## 2020-07-13 MED ORDER — WARFARIN SODIUM 10 MG PO TABS
10.0000 mg | ORAL_TABLET | Freq: Once | ORAL | Status: AC
  Administered 2020-07-13: 10 mg via ORAL
  Filled 2020-07-13: qty 1

## 2020-07-13 NOTE — Plan of Care (Signed)

## 2020-07-13 NOTE — Progress Notes (Addendum)
Name: Jaime Kelley MRN: [id number] DOB: [birth year]    ADMISSION DATE:  07/10/2020 CONSULTATION DATE: 07/11/2020  REFERRING MD :  [facility]  CHIEF COMPLAINT: Hypoxia in the setting of large left pleural effusion  BRIEF PATIENT DESCRIPTION:    SIGNIFICANT EVENTS  07/11/2020 thoracentesis 07/12/2020 bronchoscopy 07/12/2020 left chest tube pigtail placed  STUDIES:  DG CHEST PORT 1 VIEW  Result Date: 07/12/2020 CLINICAL DATA:  Left pleural effusion EXAM: PORTABLE CHEST 1 VIEW COMPARISON:  07/11/2020 FINDINGS: Decreasing left pleural effusion with left chest tube in place. Moderate to large left effusion remains with improved aeration in the left lung. Continued left lower lobe atelectasis or infiltrate. No confluent opacity on the right. No pneumothorax. Aortic atherosclerosis. IMPRESSION: Decreasing left effusion. The left effusion remains moderate to large with left lower lobe atelectasis or infiltrate. Left chest tube in place.  No visible pneumothorax. Electronically Signed   By: Rolm Baptise M.D.   On: 07/12/2020 15:56      HISTORY OF PRESENT ILLNESS:   69 year old former smoker, DM2, COPD, RA, hx DVT/PE presenting with insidious onset dyspnea beginning 3-4 months ago.  Began [place] 1 now [place] 4.  Found to have large left effusion and suggestion of endobronchial debris/mass so pulmonary consulted.  Hx of 2ppd smoking quit last year after diagnosis of COPD.   Denies sick contacts, trauma, falls.  Occasional trouble swallowing. No fevers, chills, night sweats, weight loss.  Has cough but unable to bring anything up.  Associated with the worsening dyspnea is sternal pressure worse with deep breathing which has been present for past week.  Notable labs include INR 1.1 despite her stating compliance with home coumadin and Hgb 6.8   PAST MEDICAL HISTORY :  Past Medical History:  Diagnosis Date  . Anxiety   . COPD (chronic obstructive pulmonary disease) ([place])   . Depression   .  Diabetes mellitus without complication ([place])   . Discoid lupus    sees Dr. Allyson Sabal  . Dyspnea   . GERD (gastroesophageal reflux disease)   . History of colonic polyps   . Hx pulmonary embolism   . Hypertension   . Hypothyroidism   . Rheumatoid arthritis(714.0)    sees Dr. Dossie Der   . Thyroid disease      SUBJECTIVE:  No acute distress at rest VITAL SIGNS: Temp:  [97.3 F (36.3 C)-98.4 F (36.9 C)] 98.1 F (36.7 C) (09/21 1115) Pulse Rate:  [68-87] 79 (09/21 1115) Resp:  [12-19] 18 (09/21 1115) BP: (89-145)/(43-70) 109/60 (09/21 0407) SpO2:  [90 %-100 %] 96 % (09/21 1115) Weight:  [78.9 kg-79.4 kg] 79.4 kg (09/21 0017)  PHYSICAL EXAMINATION: General: Frail kyphotic female in no acute distress Neuro: Dull effect but answers questions appropriately HEENT: No JVD or lymphadenopathy is appreciated Cardiovascular: Heart sounds are distant Lungs: Diminished breath sounds throughout severe kyphoscoliosis. Left ct with air leak and 290 cc output. Abdomen: Soft nontender positive bowel sounds Musculoskeletal: Intact Skin: 2+ edema lower extremities  Recent Labs  Lab 07/10/20 1759 07/10/20 1759 07/10/20 2132 07/11/20 0226 07/13/20 0845  NA 132*   < > 130* 131* 130*  K 3.3*   < > 3.7 3.7 4.0  CL 90*  --   --  90* 91*  CO2 30  --   --  32 30  BUN <5*  --   --  5* 9  CREATININE 0.52  --   --  0.50 0.46  GLUCOSE 168*  --   --  146* 89   < > = values in this interval not displayed.   Recent Labs  Lab 07/11/20 0226 07/12/20 0611 07/13/20 0845  HGB 7.6* 7.7* 7.8*  HCT 28.7* 28.7* 28.6*  WBC 10.4 10.9* 12.5*  PLT 429* 522* 585*   DG CHEST PORT 1 VIEW  Result Date: 07/12/2020 CLINICAL DATA:  Left pleural effusion EXAM: PORTABLE CHEST 1 VIEW COMPARISON:  07/11/2020 FINDINGS: Decreasing left pleural effusion with left chest tube in place. Moderate to large left effusion remains with improved aeration in the left lung. Continued left lower lobe atelectasis or infiltrate. No  confluent opacity on the right. No pneumothorax. Aortic atherosclerosis. IMPRESSION: Decreasing left effusion. The left effusion remains moderate to large with left lower lobe atelectasis or infiltrate. Left chest tube in place.  No visible pneumothorax. Electronically Signed   By: Rolm Baptise M.D.   On: 07/12/2020 15:56    ASSESSMENT:Plan Status post thoracentesis with left pneumothorax  Chest tube 20 cm of suction with airleak noted 290 cc output , will need to be less than 50 for ct removal Daily assessment of chest tube for removal of suction Serial chest x-rays  Endobronchial obstruction Status post fiberoptic bronchoscopy Status post thoracentesis Track lab data   Chronic struct of pulmonary disease chronic respiratory failure on 5 L nasal cannula. Continue current treatment  Further treatment per primary  [place] ACNP Acute Care Nurse Practitioner [place] Please consult [place] 07/13/2020, 11:40 AM

## 2020-07-13 NOTE — NC FL2 (Signed)
[place] LEVEL OF CARE SCREENING TOOL     IDENTIFICATION  Patient Name: Jaime Kelley Birthdate: 1951-05-05 Sex: female Admission Date (Current Location): 07/10/2020  [facility] and [place] Number:  Herbalist and Address:  [facility], [place] [address], [place], [place] [address]      Provider Number: [id number]  Attending Physician Name and Address:  Annita Brod, MD  Relative Name and Phone Number:  Bevelyn Ngo [phone]    Current Level of Care: Hospital Recommended Level of Care: [place] Prior Approval Number:    Date Approved/Denied:   PASRR Number:    Discharge Plan: SNF    Current Diagnoses: Patient Active Problem List   Diagnosis Date Noted  . Pressure injury of skin 07/12/2020  . Obesity (BMI 30-39.9) 07/12/2020  . Mucus plugging of bronchi   . Pleural effusion on left 07/10/2020  . Collapsed lung 07/10/2020  . Microcytic anemia 07/10/2020  . Chest pain 07/10/2020  . Thrombocytosis ([place]) 07/10/2020  . Generalized weakness 07/10/2020  . Chronic pain of both knees 02/25/2019  . Upper airway cough syndrome 12/22/2017  . Acute hypoxemic respiratory failure ([place]) 01/18/2017  . Type 2 diabetes mellitus ([place]) 05/25/2016  . Encounter for therapeutic drug monitoring 11/20/2013  . Anxiety state, unspecified 09/16/2013  . Long term (current) use of anticoagulants 09/11/2013  . Anticoagulated on Coumadin 06/28/2013  . Depression 05/26/2013  . Hx pulmonary embolism 05/19/2013  . Tobacco abuse 05/19/2013  . Essential hypertension 05/13/2013  . Embolism and thrombosis of deep vessels of proximal lower extremity ([place]) 11/05/2010  . BACK STRAIN, LUMBAR 07/09/2009  . Rheumatoid arthritis ([place]) 02/10/2009  . THUMB SPRAIN 06/26/2008  . COPD GOLD III 10/31/2007  . BRONCHITIS, ACUTE 06/17/2007  . EMBOLISM/THROMBOSIS, DEEP VSL PRXML LWR EXTRM 05/31/2007  . Hypothyroidism 04/15/2007  .  EMPHYSEMA 04/15/2007  . GERD 04/15/2007  . LUPUS ERYTHEMATOSUS, DISCOID 04/15/2007  . PULMONARY EMBOLISM, HX OF 04/15/2007  . Personal history of colonic polyps 04/15/2007  . DIVERTICULITIS, HX OF 04/15/2007    Orientation RESPIRATION BLADDER Height & Weight     Self, Time, Situation, Place  O2 ([place] 5 liters) Incontinent, External catheter Weight: 175 lb (79.4 kg) Height:  5\' 2"  (157.5 cm)  BEHAVIORAL SYMPTOMS/MOOD NEUROLOGICAL BOWEL NUTRITION STATUS      Continent Diet (see dc summary)  AMBULATORY STATUS COMMUNICATION OF NEEDS Skin   Extensive Assist Verbally Other (Comment) (Pressure injury 07/10/20 buttocks left stage 2)                       Personal Care Assistance Level of Assistance  Bathing, Feeding, Dressing Bathing Assistance: Maximum assistance Feeding assistance: Independent Dressing Assistance: Limited assistance     Functional Limitations Info  Sight, Hearing, Speech Sight Info: Impaired Hearing Info: Adequate Speech Info: Adequate    SPECIAL CARE FACTORS FREQUENCY  PT (By licensed PT), OT (By licensed OT)     PT Frequency: 5x week OT Frequency: 5x week            Contractures Contractures Info: Not present    Additional Factors Info  Code Status, Allergies, Psychotropic Code Status Info: Full Allergies Info: Effexor (Venlafaxine Hydrochloride) Psychotropic Info: sertraline (ZOLOFT) tablet 100 mg         Current Medications (07/13/2020):  This is the current hospital active medication list Current Facility-Administered Medications  Medication Dose Route Frequency Provider Last Rate Last Admin  . acetaminophen (TYLENOL) tablet  650 mg  650 mg Oral Q6H PRN Chotiner, Yevonne Aline, MD       Or  . acetaminophen (TYLENOL) suppository 650 mg  650 mg Rectal Q6H PRN Chotiner, Yevonne Aline, MD      . albuterol (VENTOLIN HFA) 108 (90 Base) MCG/ACT inhaler 2 puff  2 puff Inhalation Q4H PRN Chotiner, Yevonne Aline, MD   2 puff at 07/11/20 0250  . amLODipine  (NORVASC) tablet 10 mg  10 mg Oral QHS Chotiner, Yevonne Aline, MD   10 mg at 07/12/20 2133  . enoxaparin (LOVENOX) injection 40 mg  40 mg Subcutaneous Daily Annita Brod, MD   40 mg at 07/13/20 1120  . HYDROcodone-acetaminophen (NORCO) 10-325 MG per tablet 0.5 tablet  0.5 tablet Oral QID PRN Chotiner, Yevonne Aline, MD   0.5 tablet at 07/13/20 1120  . ipratropium-albuterol (DUONEB) 0.5-2.5 (3) MG/3ML nebulizer solution 3 mL  3 mL Nebulization TID Chotiner, Yevonne Aline, MD   3 mL at 07/13/20 1425  . levothyroxine (SYNTHROID) tablet 137 mcg  137 mcg Oral Q0600 Chotiner, Yevonne Aline, MD   137 mcg at 07/13/20 0515  . losartan (COZAAR) tablet 100 mg  100 mg Oral QHS Chotiner, Yevonne Aline, MD   100 mg at 07/12/20 2133  . morphine 2 MG/ML injection 2-4 mg  2-4 mg Intravenous Q4H PRN Merlene Laughter F, NP   2 mg at 07/11/20 1219  . ondansetron (ZOFRAN) tablet 4 mg  4 mg Oral Q6H PRN Chotiner, Yevonne Aline, MD       Or  . ondansetron (ZOFRAN) injection 4 mg  4 mg Intravenous Q6H PRN Chotiner, Yevonne Aline, MD      . senna-docusate (Senokot-S) tablet 1 tablet  1 tablet Oral QHS PRN Chotiner, Yevonne Aline, MD      . sertraline (ZOLOFT) tablet 100 mg  100 mg Oral QHS Chotiner, Yevonne Aline, MD   100 mg at 07/12/20 2133  . warfarin (COUMADIN) tablet 10 mg  10 mg Oral ONCE-1600 Karren Cobble, [place]      . Warfarin - Pharmacist Dosing Inpatient   Does not apply [place], [place], [facility]         Discharge Medications: Please see discharge summary for a list of discharge medications.  Relevant Imaging Results:  Relevant Lab Results:   Additional Information SSN [id number]  Loreta Ave, LCSWA

## 2020-07-13 NOTE — Progress Notes (Signed)
Physical Therapy Treatment Patient Details Name: Jaime Kelley MRN: [id number] DOB: 1950-11-24 Today's Date: 07/13/2020    History of Present Illness Jaime Kelley a 69 y.o.femalewith medical history significant forCOPD, diabetes mellitus type 2, RA, thyroidism, hypertension, history of PE/DVT on chronic Coumadin who presents to the hospital via EMS with complaint of substernal chest pain and shortness of breath.  Thoracentesis and chest tube placed on 9/20.     PT Comments    Pt admitted with above diagnosis. Pt was able to sit EOB for 5 min with min guard assist but could not transfer to the chair even with +2 max to total assist with use of pad.  Could not clear pts buttocks off bed.  Pt not giving much effort and asking constantly to lie back down. Deemed it unsafe to get her to chair without lift at current level therefore laid her back down.  Pt currently with functional limitations due to balance and endurance deficits. Pt will benefit from skilled PT to increase their independence and safety with mobility to allow discharge to the venue listed below.     Follow Up Recommendations  SNF;Supervision/Assistance - 24 hour     Equipment Recommendations  Other (comment) (TBA)    Recommendations for Other Services       Precautions / Restrictions Precautions Precautions: Fall Precaution Comments: chest tube Restrictions Weight Bearing Restrictions: No    Mobility  Bed Mobility Overal bed mobility: Needs Assistance Bed Mobility: Supine to Sit;Sit to Supine     Supine to sit: Mod assist Sit to supine: Min assist   General bed mobility comments: assist for LEs and for elevation of trunk.   Transfers Overall transfer level: Needs assistance   Transfers: Sit to/from Stand Sit to Stand: Total assist;+2 physical assistance;From elevated surface         General transfer comment: Attempted but could not clear buttocks off bed due to low effort from pt. Even with  use of pad, deemed it unsafe to transfer pt to chair therefore laid her back in bed.   Ambulation/Gait             General Gait Details: States she hasnt got OOB in months   Stairs             Wheelchair Mobility    Modified Rankin (Stroke Patients Only)       Balance Overall balance assessment: Needs assistance Sitting-balance support: No upper extremity supported;Feet supported;Bilateral upper extremity supported Sitting balance-[name]: Fair Sitting balance - Comments: pt able to sit EOB for 5 min without assist. Pt asked to lie back down as she states she was "tired".  Did a few LE exercises.                                    Cognition Arousal/Alertness: Awake/alert Behavior During Therapy: WFL for tasks assessed/performed Overall Cognitive Status: History of cognitive impairments - at baseline                                        Exercises General Exercises - Lower Extremity Ankle Circles/Pumps: AROM;Both;10 reps;Seated Long Arc Quad: AROM;Both;10 reps;Seated    General Comments General comments (skin integrity, edema, etc.): Pt on 5LO2 with VSS      Pertinent Vitals/Pain Pain Assessment: Faces Faces Pain Scale:  Hurts whole lot Pain Location: generalized Pain Descriptors / Indicators: Aching;Grimacing;Guarding Pain Intervention(s): Limited activity within patient's tolerance;Monitored during session;Repositioned    Home Living                      Prior Function            PT Goals (current goals can now be found in the care plan section) Acute Rehab PT Goals Patient Stated Goal: unable to state Progress towards PT goals: Not progressing toward goals - comment (Pt self limiting)    Frequency    Min 2X/week      PT Plan Current plan remains appropriate    Co-evaluation              AM-PAC PT "6 Clicks" Mobility   Outcome Measure  Help needed turning from your back to your side  while in a flat bed without using bedrails?: A Lot Help needed moving from lying on your back to sitting on the side of a flat bed without using bedrails?: A Lot Help needed moving to and from a bed to a chair (including a wheelchair)?: A Lot Help needed standing up from a chair using your arms (e.g., wheelchair or bedside chair)?: Total Help needed to walk in hospital room?: Total Help needed climbing 3-5 steps with a railing? : Total 6 Click Score: 9    End of Session Equipment Utilized During Treatment: Gait belt;Oxygen Activity Tolerance: Patient limited by fatigue Patient left: in bed;with call bell/phone within reach;with bed alarm set Nurse Communication: Mobility status;Need for lift equipment PT Visit Diagnosis: Muscle weakness (generalized) (M62.81)     Time: 3474-2595 PT Time Calculation (min) (ACUTE ONLY): 24 min  Charges:  $Therapeutic Exercise: 8-22 mins $Therapeutic Activity: 8-22 mins                     Jaime Kelley W,PT Acute Rehabilitation Services Pager:  [phone]  Office:  [place] 07/13/2020, 11:47 AM

## 2020-07-13 NOTE — TOC Initial Note (Signed)
Transition of Care [facility]) - Initial/Assessment Note    Patient Details  Name: Jaime Kelley MRN: [id number] Date of Birth: [birth year]  [facility] CM/SW Contact:    Loreta Ave, [place] Phone Number: 07/13/2020, 4:47 PM  Clinical Narrative:                 CSW received consult for possible SNF placement at time of discharge. CSW tried to speak with patient regarding PT recommendation of SNF placement at time of discharge, but pt was very lethargic and unresponsive. CSW called and spoke with pt's daughter Bevelyn Ngo [phone], who stated that her mom has been pretty much bed bound for about 1.5 years. She stated that pt lives with her roommate/ex girlfriend who also has health issues. Patient's daughter reported that patient's roommate  is currently unable to care for patient at their home given patient's current physical needs and fall risk. Patient's daughter expressed understanding of PT recommendation and is agreeable to SNF placement at time of discharge but is concerned that pt will deny the need for SNF, but knows that the conditions of the home are not suitable. CSW discussed insurance authorization process and provided Medicare SNF ratings list. Patient has not received the COVID vaccines. Patient's daughter expressed that she would like pt to be vaccinated prior to leaving, especially if pt is going to SNF. Patient's daughter expressed being hopeful for rehab and for pt  to feel better soon. No further questions reported at this time. CSW to continue to follow and assist with discharge planning needs.    Barriers to Discharge: Continued Medical Work up, Ship broker   Patient Goals and CMS Choice Patient states their goals for this hospitalization and ongoing recovery are:: Get better soon CMS Medicare.gov Compare Post Acute Care list provided to::  Bevelyn Ngo [phone]) Choice offered to / list presented to : Adult Children  Expected  Discharge Plan and Services   In-house Referral: Clinical Social Work     Living arrangements for the past 2 months: Single Family Home                                      Prior Living Arrangements/Services Living arrangements for the past 2 months: Single Family Home Lives with:: Roommate, Domestic Partner Patient language and need for interpreter reviewed:: Yes Do you feel safe going back to the place where you live?: No   No one to take care of pt  Need for Family Participation in Patient Care: Yes (Comment) Care giver support system in place?: No (comment)   Criminal Activity/Legal Involvement Pertinent to Current Situation/Hospitalization: No - Comment as needed  Activities of Daily Living      Permission Sought/Granted Permission sought to share information with : Facility Sport and exercise psychologist, Case Manager, Family Supports Permission granted to share information with : No (pt unresponsive)  Share Information with NAME: Bevelyn Ngo  Permission granted to share info w AGENCY: SNFs  Permission granted to share info w Relationship: Daughter  Permission granted to share info w Contact Information: [phone]  Emotional Assessment Appearance:: Appears stated age Attitude/Demeanor/Rapport: Sedated Affect (typically observed): Other (comment) (sleeping) Orientation: : Oriented to Self, Oriented to Place, Oriented to  Time, Oriented to Situation Alcohol / Substance Use: Not Applicable Psych Involvement: No (comment)  Admission diagnosis:  Fecal impaction ([place]) [K56.41] Collapsed lung [J98.19] Pleural effusion, left [J90] Symptomatic anemia [D64.9]  Patient Active Problem List   Diagnosis Date Noted  . Pressure injury of skin 07/12/2020  . Obesity (BMI 30-39.9) 07/12/2020  . Mucus plugging of bronchi   . Pleural effusion on left 07/10/2020  . Collapsed lung 07/10/2020  . Microcytic anemia 07/10/2020  . Chest pain 07/10/2020  . Thrombocytosis ([place])  07/10/2020  . Generalized weakness 07/10/2020  . Chronic pain of both knees 02/25/2019  . Upper airway cough syndrome 12/22/2017  . Acute hypoxemic respiratory failure ([place]) 01/18/2017  . Type 2 diabetes mellitus ([place]) 05/25/2016  . Encounter for therapeutic drug monitoring 11/20/2013  . Anxiety state, unspecified 09/16/2013  . Long term (current) use of anticoagulants 09/11/2013  . Anticoagulated on Coumadin 06/28/2013  . Depression 05/26/2013  . Hx pulmonary embolism 05/19/2013  . Tobacco abuse 05/19/2013  . Essential hypertension 05/13/2013  . Embolism and thrombosis of deep vessels of proximal lower extremity ([place]) 11/05/2010  . BACK STRAIN, LUMBAR 07/09/2009  . Rheumatoid arthritis ([place]) 02/10/2009  . THUMB SPRAIN 06/26/2008  . COPD GOLD III 10/31/2007  . BRONCHITIS, ACUTE 06/17/2007  . EMBOLISM/THROMBOSIS, DEEP VSL PRXML LWR EXTRM 05/31/2007  . Hypothyroidism 04/15/2007  . EMPHYSEMA 04/15/2007  . GERD 04/15/2007  . LUPUS ERYTHEMATOSUS, DISCOID 04/15/2007  . PULMONARY EMBOLISM, HX OF 04/15/2007  . Personal history of colonic polyps 04/15/2007  . DIVERTICULITIS, HX OF 04/15/2007   PCP:  Laurey Morale, MD Pharmacy:   [id number] [place], [place] [address] Phone: [phone] Fax: [phone]     Social Determinants of Health (SDOH) Interventions    Readmission Risk Interventions No flowsheet data found.

## 2020-07-13 NOTE — Plan of Care (Signed)
  Problem: Clinical Measurements: Goal: Respiratory complications will improve Outcome: Progressing   Problem: Activity: Goal: Risk for activity intolerance will decrease Outcome: Progressing   Problem: Safety: Goal: Ability to remain free from injury will improve Outcome: Progressing   

## 2020-07-13 NOTE — Progress Notes (Signed)
PROGRESS NOTE  Jaime Kelley WCB:[id number] DOB: 01-20-1951 DOA: 07/10/2020 PCP: Laurey Morale, MD  HPI/Recap of past 55 hours: 69 year old female with past medical history of obesity, COPD with chronic respiratory failure on 5 L nasal cannula, diabetes mellitus, rheumatoid arthritis, hypertension and PE on chronic Coumadin who presented to the emergency room with shortness of breath on 9/18.  Patient found to be anemic with a hemoglobin of 6.8, was at 13 3 years ago on her chart, subtherapeutic INR of 1.1 and a large left-sided pleural effusion causing near complete collapse of her left lung and requiring a nonrebreather to keep her oxygen saturations greater than 90%.  Pulmonary consulted for thoracentesis and patient was admitted to the hospitalist service.  Patient underwent thoracentesis on 9/19 with approximately 1300 cc of fluid removed, .  Breathing has since improved post procedure x-rays noted persistent complete opacification of left hemithorax.  Pulmonary took patient for bronchoscopy on 9/20 finding solidified mucous plug within the left mainstem which was able to be suctioned clear.  Following, pleural catheter placed.   Today, patient is feeling significantly better, breathing easier.  Oxygen saturations are at 96% on 5 L.  Assessment/Plan: Principal Problem: COPD Gold with chronic respiratory failure on 5 L nasal cannula with large pleural effusion on left causing collapsed lung.  Status post thoracentesis followed by bronchoscopy and resolution of mucous plug and pleural catheter insertion.  Lung reexpanded.  Follow-up x-ray noted some pleural effusion.  Over a liter of fluid out since chest tube has been placed.  Noted blood cultures grew out 1 out of 4 for staph epi, likely contamination. Active Problems:    Type 2 diabetes mellitus ([place]): Following CBGs, staying under 180.    Anticoagulated on Coumadin: INR subtherapeutic on admission.  Was on heparin which was held for  procedures.  Coumadin restarted 9/21  Hypothyroidism: Continue Synthroid.  microcytic anemia: No evidence of bleeding.  Hemoglobin on admission at 6.5 with an MCV of 63.  Status post transfusion and hemoglobin at 7.7 on 9/20 and has remained stable.  Note drop from previous day at 8.8 although they admitted from hemoconcentration.  Continue to monitor closely.  Checking ferritin level although low iron, but normal TIBC and ferritin makes this likely chronic disease from COPD  Obesity: Patient meets criteria with BMI greater than 30  Essential hypertension: Continue Norvasc  Depression: Continue Zoloft    Thrombocytosis ([place]): In the setting of pleural effusion   Generalized weakness   Pressure injury of skin   Code Status: Full code  Family Communication: Left message for son  Disposition Plan: Seen by PT and recommended for skilled nursing.  Hopefully can go in a few days once chest tube is out and INR is therapeutic   Consultants:  Pulmonary  Procedures:  Status post thoracentesis on 9/19 with 1300 cc fluid removed  Status post bronchoscopy and pigtail catheter placement on 9/20  Antimicrobials:  None  DVT prophylaxis: Anticoagulation on hold until post procedure.  SCDs.   Objective: Vitals:   07/13/20 0850 07/13/20 1115  BP:    Pulse: 74 79  Resp: 18 18  Temp:  98.1 F (36.7 C)  SpO2: 95% 96%    Intake/Output Summary (Last 24 hours) at 07/13/2020 1120 Last data filed at 07/13/2020 1046 Gross per 24 hour  Intake 970 ml  Output 1325 ml  Net -355 ml   Filed Weights   07/12/20 0535 07/12/20 1257 07/13/20 0017  Weight: 78.9 kg 78.9 kg  79.4 kg   Body mass index is 32.01 kg/m.  Exam:   General: Alert and oriented x3, fatigued, no acute distress  HEENT: Normocephalic and atraumatic, mucous membranes are moist  Neck: Thick, narrow airway  Cardiovascular: Regular rate and rhythm, S1-S2  Respiratory: Decreased breath sounds throughout, but better  airway aeration on both sides  Abdomen: Soft, obese, nontender, positive bowel sounds  Musculoskeletal: No clubbing or cyanosis, trace pitting edema  Skin: No skin breaks, tears or lesions  Neuro: No focal deficits  Psychiatry: Appropriate, no evidence of psychoses   Data Reviewed: CBC: Recent Labs  Lab 07/10/20 1759 07/10/20 2132 07/11/20 0226 07/12/20 0611 07/13/20 0845  WBC 6.8  --  10.4 10.9* 12.5*  NEUTROABS 4.1  --   --   --   --   HGB 6.5* 8.8* 7.6* 7.7* 7.8*  HCT 26.8* 26.0* 28.7* 28.7* 28.6*  MCV 63.4*  --  67.1* 68.2* 67.1*  PLT 607*  --  429* 522* 240*   Basic Metabolic Panel: Recent Labs  Lab 07/10/20 1759 07/10/20 2132 07/11/20 0226 07/13/20 0845  NA 132* 130* 131* 130*  K 3.3* 3.7 3.7 4.0  CL 90*  --  90* 91*  CO2 30  --  32 30  GLUCOSE 168*  --  146* 89  BUN <5*  --  5* 9  CREATININE 0.52  --  0.50 0.46  CALCIUM 8.6*  --  8.6* 8.5*   GFR: Estimated Creatinine Clearance: 64.8 mL/min (by C-G formula based on SCr of 0.46 mg/dL). Liver Function Tests: Recent Labs  Lab 07/10/20 1759 07/11/20 0226  AST 13* 11*  ALT 8 8  ALKPHOS 56 60  BILITOT 0.4 0.5  PROT 6.6 6.6  ALBUMIN 2.7* 2.7*   No results for input(s): LIPASE, AMYLASE in the last 168 hours. No results for input(s): AMMONIA in the last 168 hours. Coagulation Profile: Recent Labs  Lab 07/10/20 1816 07/11/20 0226  INR 1.1 1.1   Cardiac Enzymes: No results for input(s): CKTOTAL, CKMB, CKMBINDEX, TROPONINI in the last 168 hours. BNP (last 3 results) No results for input(s): PROBNP in the last 8760 hours. HbA1C: No results for input(s): HGBA1C in the last 72 hours. CBG: Recent Labs  Lab 07/10/20 1835 07/11/20 0019 07/11/20 1129  GLUCAP 156* 169* 117*   Lipid Profile: No results for input(s): CHOL, HDL, LDLCALC, TRIG, CHOLHDL, LDLDIRECT in the last 72 hours. Thyroid Function Tests: No results for input(s): TSH, T4TOTAL, FREET4, T3FREE, THYROIDAB in the last 72  hours. Anemia Panel: Recent Labs    07/10/20 2146 07/11/20 0226  FERRITIN  --  20  TIBC 420  --   IRON 10*  --    Urine analysis:    Component Value Date/Time   COLORURINE YELLOW 01/18/2017 [place] 01/18/2017 1451   LABSPEC 1.008 01/18/2017 1451   PHURINE 6.0 01/18/2017 1451   GLUCOSEU NEGATIVE 01/18/2017 1451   HGBUR SMALL (A) 01/18/2017 1451   BILIRUBINUR NEGATIVE 01/18/2017 1451   BILIRUBINUR n 02/03/2015 1507   KETONESUR NEGATIVE 01/18/2017 1451   PROTEINUR NEGATIVE 01/18/2017 1451   UROBILINOGEN 0.2 02/03/2015 1507   NITRITE NEGATIVE 01/18/2017 1451   LEUKOCYTESUR LARGE (A) 01/18/2017 1451   Sepsis Labs: @LABRCNTIP (procalcitonin:4,lacticidven:4)  ) Recent Results (from the past 240 hour(s))  SARS Coronavirus 2 by RT PCR (hospital order, performed in [place] hospital lab) Nasopharyngeal Nasopharyngeal Swab     Status: None   Collection Time: 07/10/20  5:35 PM   Specimen: Nasopharyngeal  Swab  Result Value Ref Range Status   SARS Coronavirus 2 NEGATIVE NEGATIVE Final    Comment: (NOTE) SARS-CoV-2 target nucleic acids are NOT DETECTED.  The SARS-CoV-2 RNA is generally detectable in upper and lower respiratory specimens during the acute phase of infection. The lowest concentration of SARS-CoV-2 viral copies this assay can detect is 250 copies / mL. A negative result does not preclude SARS-CoV-2 infection and should not be used as the sole basis for treatment or other patient management decisions.  A negative result may occur with improper specimen collection / handling, submission of specimen other than nasopharyngeal swab, presence of viral mutation(s) within the areas targeted by this assay, and inadequate number of viral copies (<250 copies / mL). A negative result must be combined with clinical observations, patient history, and epidemiological information.  Fact Sheet for Patients:   StrictlyIdeas.no  Fact  Sheet for Healthcare Providers: BankingDealers.co.za  This test is not yet approved or  cleared by the [place] FDA and has been authorized for detection and/or diagnosis of SARS-CoV-2 by FDA under an Emergency Use Authorization (EUA).  This EUA will remain in effect (meaning this test can be used) for the duration of the COVID-19 declaration under Section 564(b)(1) of the Act, 21 U.S.C. section 360bbb-3(b)(1), unless the authorization is terminated or revoked sooner.  Performed at [facility], [place] [address], [place], [place] [address]   Culture, blood (routine x 2)     Status: Abnormal (Preliminary result)   Collection Time: 07/10/20  5:36 PM   Specimen: BLOOD  Result Value Ref Range Status   Specimen Description BLOOD LEFT ANTECUBITAL  Final   Special Requests   Final    BOTTLES DRAWN AEROBIC AND ANAEROBIC Blood Culture adequate volume   Culture  Setup Time   Final    GRAM POSITIVE COCCI IN CLUSTERS IN BOTH AEROBIC AND ANAEROBIC BOTTLES CRITICAL RESULT CALLED TO, READ BACK BY AND VERIFIED WITH: PHARMD K PIERCE [id number] FCP    Culture (A)  Final    STAPHYLOCOCCUS EPIDERMIDIS THE SIGNIFICANCE OF ISOLATING THIS ORGANISM FROM A SINGLE SET OF BLOOD CULTURES WHEN MULTIPLE SETS ARE DRAWN IS UNCERTAIN. PLEASE NOTIFY THE MICROBIOLOGY DEPARTMENT WITHIN ONE WEEK IF SPECIATION AND SENSITIVITIES ARE REQUIRED. Performed at [facility], [place] [address] [place], [place] [address]    Report Status PENDING  Incomplete  Blood Culture ID Panel (Reflexed)     Status: Abnormal   Collection Time: 07/10/20  5:36 PM  Result Value Ref Range Status   Enterococcus faecalis NOT DETECTED NOT DETECTED Final   Enterococcus Faecium NOT DETECTED NOT DETECTED Final   Listeria monocytogenes NOT DETECTED NOT DETECTED Final   Staphylococcus species DETECTED (A) NOT DETECTED Final    Comment: CRITICAL RESULT CALLED TO, READ BACK BY AND VERIFIED WITH: PHARMD K PIERCE  [id number] FCP    Staphylococcus aureus (BCID) NOT DETECTED NOT DETECTED Final   Staphylococcus epidermidis DETECTED (A) NOT DETECTED Final    Comment: CRITICAL RESULT CALLED TO, READ BACK BY AND VERIFIED WITH: PHARMD K PIERCE [id number] FCP    Staphylococcus lugdunensis NOT DETECTED NOT DETECTED Final   Streptococcus species NOT DETECTED NOT DETECTED Final   Streptococcus agalactiae NOT DETECTED NOT DETECTED Final   Streptococcus pneumoniae NOT DETECTED NOT DETECTED Final   Streptococcus pyogenes NOT DETECTED NOT DETECTED Final   A.calcoaceticus-baumannii NOT DETECTED NOT DETECTED Final   Bacteroides fragilis NOT DETECTED NOT DETECTED Final   Enterobacterales NOT DETECTED NOT  DETECTED Final   Enterobacter cloacae complex NOT DETECTED NOT DETECTED Final   Escherichia coli NOT DETECTED NOT DETECTED Final   Klebsiella aerogenes NOT DETECTED NOT DETECTED Final   Klebsiella oxytoca NOT DETECTED NOT DETECTED Final   Klebsiella pneumoniae NOT DETECTED NOT DETECTED Final   Proteus species NOT DETECTED NOT DETECTED Final   Salmonella species NOT DETECTED NOT DETECTED Final   Serratia marcescens NOT DETECTED NOT DETECTED Final   Haemophilus influenzae NOT DETECTED NOT DETECTED Final   Neisseria meningitidis NOT DETECTED NOT DETECTED Final   Pseudomonas aeruginosa NOT DETECTED NOT DETECTED Final   Stenotrophomonas maltophilia NOT DETECTED NOT DETECTED Final   Candida albicans NOT DETECTED NOT DETECTED Final   Candida auris NOT DETECTED NOT DETECTED Final   Candida glabrata NOT DETECTED NOT DETECTED Final   Candida krusei NOT DETECTED NOT DETECTED Final   Candida parapsilosis NOT DETECTED NOT DETECTED Final   Candida tropicalis NOT DETECTED NOT DETECTED Final   Cryptococcus neoformans/gattii NOT DETECTED NOT DETECTED Final   Methicillin resistance mecA/C NOT DETECTED NOT DETECTED Final    Comment: Performed at [facility], [place] [address], [place], [place] [address]  Culture, blood  (routine x 2)     Status: None (Preliminary result)   Collection Time: 07/10/20  6:47 PM   Specimen: BLOOD RIGHT FOREARM  Result Value Ref Range Status   Specimen Description BLOOD RIGHT FOREARM  Final   Special Requests   Final    BOTTLES DRAWN AEROBIC AND ANAEROBIC Blood Culture adequate volume   Culture   Final    NO GROWTH 2 DAYS Performed at [facility], [place] [address], [place], [place] [address]    Report Status PENDING  Incomplete  Body fluid culture (includes gram stain)     Status: None (Preliminary result)   Collection Time: 07/11/20  9:30 AM   Specimen: Pleural Fluid  Result Value Ref Range Status   Specimen Description PLEURAL FLUID  Final   Special Requests LEFT  Final   Gram Stain   Final    WBC PRESENT,BOTH PMN AND MONONUCLEAR NO ORGANISMS SEEN CYTOSPIN SMEAR    Culture   Final    NO GROWTH 2 DAYS Performed at [facility], [place] [address] [place], [place] [address]    Report Status PENDING  Incomplete      Studies: DG CHEST PORT 1 VIEW  Result Date: 07/12/2020 CLINICAL DATA:  Left pleural effusion EXAM: PORTABLE CHEST 1 VIEW COMPARISON:  07/11/2020 FINDINGS: Decreasing left pleural effusion with left chest tube in place. Moderate to large left effusion remains with improved aeration in the left lung. Continued left lower lobe atelectasis or infiltrate. No confluent opacity on the right. No pneumothorax. Aortic atherosclerosis. IMPRESSION: Decreasing left effusion. The left effusion remains moderate to large with left lower lobe atelectasis or infiltrate. Left chest tube in place.  No visible pneumothorax. Electronically Signed   By: Rolm Baptise M.D.   On: 07/12/2020 15:56    Scheduled Meds: . amLODipine  10 mg Oral QHS  . enoxaparin (LOVENOX) injection  40 mg Subcutaneous Daily  . ipratropium-albuterol  3 mL Nebulization TID  . levothyroxine  137 mcg Oral Q0600  . losartan  100 mg Oral QHS  . sertraline  100 mg Oral QHS  . warfarin  10 mg Oral  ONCE-1600  . Warfarin - Pharmacist Dosing Inpatient   Does not apply q1600    Continuous Infusions:   LOS: 3 days  Annita Brod, MD [facility]   07/13/2020, 11:20 AM

## 2020-07-13 NOTE — Progress Notes (Signed)
RE: Jaime Kelley Date of Birth:  07/03/1951 Date: 07/13/20  Please be advised that the above-named patient will require a short-term nursing home stay - anticipated 30 days or less for rehabilitation and strengthening.  The plan is for return home.

## 2020-07-13 NOTE — Progress Notes (Signed)
ANTICOAGULATION CONSULT NOTE - Initial Consult  Pharmacy Consult for Coumadin Indication: h/o multiple dVTs and PEs  Allergies  Allergen Reactions   Effexor [Venlafaxine Hydrochloride] Rash    Patient Measurements: Height: 5\' 2"  (157.5 cm) Weight: 79.4 kg (175 lb) IBW/kg (Calculated) : 50.1  Vital Signs: Temp: 97.8 F (36.6 C) (09/21 0407) Temp Source: Oral (09/21 0407) BP: 109/60 (09/21 0407) Pulse Rate: 74 (09/21 0850)  Labs: Recent Labs    07/10/20 1759 07/10/20 1759 07/10/20 1816 07/10/20 1935 07/10/20 2132 07/10/20 2132 07/11/20 0226 07/12/20 0611  HGB 6.5*   < >  --   --  8.8*   < > 7.6* 7.7*  HCT 26.8*   < >  --   --  26.0*  --  28.7* 28.7*  PLT 607*  --   --   --   --   --  429* 522*  LABPROT  --   --  13.9  --   --   --  14.1  --   INR  --   --  1.1  --   --   --  1.1  --   HEPARINUNFRC  --   --   --   --   --   --  0.33  --   CREATININE 0.52  --   --   --   --   --  0.50  --   TROPONINIHS 4  --   --  5  --   --  4  --    < > = values in this interval not displayed.    Estimated Creatinine Clearance: 64.8 mL/min (by C-G formula based on SCr of 0.5 mg/dL).   Medical History: Past Medical History:  Diagnosis Date   Anxiety    COPD (chronic obstructive pulmonary disease) ([place])    Depression    Diabetes mellitus without complication (HCC)    Discoid lupus    sees Dr. Allyson Sabal   Dyspnea    GERD (gastroesophageal reflux disease)    History of colonic polyps    Hx pulmonary embolism    Hypertension    Hypothyroidism    Rheumatoid arthritis(714.0)    sees Dr. Dossie Der    Thyroid disease     Assessment: CC/HPI: substernal chest pain and shortness of breath > pleural effusion, Hgb 6.8,  PMH: COPD on 4-5L PTA, diabetes mellitus type 2, discoid lupus, GERD, RA, hypothyroidism, hypertension, history of PE/DVT on chronic Coumadin. 40 pack years, quit x45yr, obesity, microcytic anemia, depression  Anticoag: H/o DVT and PE (2008,2012,2014) on  warfarin. LD 9/17. INR 1.1 Hgb 7.7. Plts 522 - PTA regimen 7.5 mg daily except 3.75 mg on Wed (INR 1.1 on admit)  Goal of Therapy:  INR 2-3 Monitor platelets by anticoagulation protocol: Yes   Plan:  Coumadin 10mg  po x 1 tonight Daily INR Any overlapping anticoagulation?   Harald Quevedo S. Alford Highland, PharmD, BCPS Clinical Staff Pharmacist [email or web address] Alford Highland, Liona Wengert Stillinger 07/13/2020,9:27 AM

## 2020-07-13 NOTE — Social Work (Signed)
CSW tried to talk with pt about dc plans but pt very lethargic and unresponsive. CSW tried to call both children listed on contact list but voicemail not set up for either. CSW will continue to try.

## 2020-07-14 ENCOUNTER — Encounter (HOSPITAL_COMMUNITY): Payer: Self-pay | Admitting: Pulmonary Disease

## 2020-07-14 ENCOUNTER — Inpatient Hospital Stay (HOSPITAL_COMMUNITY): Payer: Medicare Other

## 2020-07-14 ENCOUNTER — Other Ambulatory Visit: Payer: Self-pay | Admitting: Family Medicine

## 2020-07-14 LAB — CBC
HCT: 28.3 % — ABNORMAL LOW (ref 36.0–46.0)
Hemoglobin: 7.5 g/dL — ABNORMAL LOW (ref 12.0–15.0)
MCH: 18.1 pg — ABNORMAL LOW (ref 26.0–34.0)
MCHC: 26.5 g/dL — ABNORMAL LOW (ref 30.0–36.0)
MCV: 68.4 fL — ABNORMAL LOW (ref 80.0–100.0)
Platelets: 609 10*3/uL — ABNORMAL HIGH (ref 150–400)
RBC: 4.14 MIL/uL (ref 3.87–5.11)
RDW: 29.4 % — ABNORMAL HIGH (ref 11.5–15.5)
WBC: 11.3 10*3/uL — ABNORMAL HIGH (ref 4.0–10.5)
nRBC: 0 % (ref 0.0–0.2)

## 2020-07-14 LAB — BODY FLUID CULTURE: Culture: NO GROWTH

## 2020-07-14 LAB — PROTIME-INR
INR: 1.1 (ref 0.8–1.2)
Prothrombin Time: 13.7 seconds (ref 11.4–15.2)

## 2020-07-14 MED ORDER — ADULT MULTIVITAMIN W/MINERALS CH
1.0000 | ORAL_TABLET | Freq: Every day | ORAL | Status: DC
  Administered 2020-07-14 – 2020-07-27 (×14): 1 via ORAL
  Filled 2020-07-14 (×14): qty 1

## 2020-07-14 MED ORDER — WARFARIN SODIUM 10 MG PO TABS
10.0000 mg | ORAL_TABLET | Freq: Once | ORAL | Status: AC
  Administered 2020-07-14: 10 mg via ORAL
  Filled 2020-07-14: qty 1

## 2020-07-14 NOTE — Progress Notes (Signed)
Name: Jaime Kelley MRN: [id number] DOB: December 14, 1950    ADMISSION DATE:  07/10/2020 CONSULTATION DATE: 07/11/2020  REFERRING MD :  [facility]  CHIEF COMPLAINT: Hypoxia in the setting of large left pleural effusion  BRIEF PATIENT DESCRIPTION:    SIGNIFICANT EVENTS  07/11/2020 thoracentesis 07/12/2020 bronchoscopy 07/12/2020 left chest tube pigtail placed  STUDIES:  DG Chest Port 1 View  Result Date: 07/14/2020 CLINICAL DATA:  Chest tube surveillance EXAM: PORTABLE CHEST 1 VIEW COMPARISON:  07/13/2020 FINDINGS: Left-sided chest tube remains in place. Stable cardiomegaly. Aortic atherosclerosis. Suspect small residual left-sided pleural effusion. Persistent bibasilar atelectasis. No pneumothorax is seen. IMPRESSION: 1. Left-sided chest tube remains in place. No pneumothorax. 2. Persistent bibasilar atelectasis and probable small residual left pleural effusion. Electronically Signed   By: Davina Poke D.O.   On: 07/14/2020 08:18   DG Chest Port 1 View  Result Date: 07/13/2020 CLINICAL DATA:  69 year old female with pleural effusion, chest tube placed. EXAM: PORTABLE CHEST 1 VIEW COMPARISON:  Portable chest 07/12/2020 and earlier. FINDINGS: Portable AP semi upright view at 1222 hours. Left chest tube position has not significantly changed. No pneumothorax. No residual pleural effusion is evident now. Mild patchy residual opacity at the left lung base. Mediastinal contours are within normal limits. Allowing for portable technique the right lung appears clear. Negative trachea. No acute osseous abnormality identified. IMPRESSION: 1. Stable left chest tube. Residual atelectasis but no pneumothorax or residual pleural effusion. 2. No new cardiopulmonary abnormality. Electronically Signed   By: Genevie Ann M.D.   On: 07/13/2020 12:41   DG CHEST PORT 1 VIEW  Result Date: 07/12/2020 CLINICAL DATA:  Left pleural effusion EXAM: PORTABLE CHEST 1 VIEW COMPARISON:  07/11/2020 FINDINGS: Decreasing left  pleural effusion with left chest tube in place. Moderate to large left effusion remains with improved aeration in the left lung. Continued left lower lobe atelectasis or infiltrate. No confluent opacity on the right. No pneumothorax. Aortic atherosclerosis. IMPRESSION: Decreasing left effusion. The left effusion remains moderate to large with left lower lobe atelectasis or infiltrate. Left chest tube in place.  No visible pneumothorax. Electronically Signed   By: Rolm Baptise M.D.   On: 07/12/2020 15:56      HISTORY OF PRESENT ILLNESS:   69 year old former smoker, DM2, COPD, RA, hx DVT/PE presenting with insidious onset dyspnea beginning 3-4 months ago.  Began [place] 1 now [place] 4.  Found to have large left effusion and suggestion of endobronchial debris/mass so pulmonary consulted.  Hx of 2ppd smoking quit last year after diagnosis of COPD.   Denies sick contacts, trauma, falls.  Occasional trouble swallowing. No fevers, chills, night sweats, weight loss.  Has cough but unable to bring anything up.  Associated with the worsening dyspnea is sternal pressure worse with deep breathing which has been present for past week.  Notable labs include INR 1.1 despite her stating compliance with home coumadin and Hgb 6.8   PAST MEDICAL HISTORY :  Past Medical History:  Diagnosis Date  . Anxiety   . COPD (chronic obstructive pulmonary disease) ([place])   . Depression   . Diabetes mellitus without complication ([place])   . Discoid lupus    sees Dr. Allyson Sabal  . Dyspnea   . GERD (gastroesophageal reflux disease)   . History of colonic polyps   . Hx pulmonary embolism   . Hypertension   . Hypothyroidism   . Rheumatoid arthritis(714.0)    sees Dr. Dossie Der   . Thyroid disease  SUBJECTIVE:  No acute distress at rest.  Chest tube placed to underwater seal suction removed VITAL SIGNS: Temp:  [98.1 F (36.7 C)-98.3 F (36.8 C)] 98.2 F (36.8 C) (09/22 0800) Pulse Rate:  [72-79] 76 (09/22 0800) Resp:   [17-19] 18 (09/22 0400) BP: (104-124)/(60-67) 104/60 (09/22 0800) SpO2:  [95 %-100 %] 98 % (09/22 0730)  PHYSICAL EXAMINATION: General: Frail elderly female noted to be kyphotic and hard of hearing HEENT: No JVD or adenopathy Neuro: Grossly intact follows commands CV: Heart sounds are distant PULM: Diminished breath sounds in the bases, left chest tube without air leak less than 50 cc every 24 hours drainage  GI: soft, bsx4 active obese Extremities: warm/dry, 2+ edema  Skin: no rashes or lesions    Recent Labs  Lab 07/10/20 1759 07/10/20 1759 07/10/20 2132 07/11/20 0226 07/13/20 0845  NA 132*   < > 130* 131* 130*  K 3.3*   < > 3.7 3.7 4.0  CL 90*  --   --  90* 91*  CO2 30  --   --  32 30  BUN <5*  --   --  5* 9  CREATININE 0.52  --   --  0.50 0.46  GLUCOSE 168*  --   --  146* 89   < > = values in this interval not displayed.   Recent Labs  Lab 07/12/20 0611 07/13/20 0845 07/14/20 0735  HGB 7.7* 7.8* 7.5*  HCT 28.7* 28.6* 28.3*  WBC 10.9* 12.5* 11.3*  PLT 522* 585* 609*   DG Chest Port 1 View  Result Date: 07/14/2020 CLINICAL DATA:  Chest tube surveillance EXAM: PORTABLE CHEST 1 VIEW COMPARISON:  07/13/2020 FINDINGS: Left-sided chest tube remains in place. Stable cardiomegaly. Aortic atherosclerosis. Suspect small residual left-sided pleural effusion. Persistent bibasilar atelectasis. No pneumothorax is seen. IMPRESSION: 1. Left-sided chest tube remains in place. No pneumothorax. 2. Persistent bibasilar atelectasis and probable small residual left pleural effusion. Electronically Signed   By: Davina Poke D.O.   On: 07/14/2020 08:18   DG Chest Port 1 View  Result Date: 07/13/2020 CLINICAL DATA:  69 year old female with pleural effusion, chest tube placed. EXAM: PORTABLE CHEST 1 VIEW COMPARISON:  Portable chest 07/12/2020 and earlier. FINDINGS: Portable AP semi upright view at 1222 hours. Left chest tube position has not significantly changed. No pneumothorax. No  residual pleural effusion is evident now. Mild patchy residual opacity at the left lung base. Mediastinal contours are within normal limits. Allowing for portable technique the right lung appears clear. Negative trachea. No acute osseous abnormality identified. IMPRESSION: 1. Stable left chest tube. Residual atelectasis but no pneumothorax or residual pleural effusion. 2. No new cardiopulmonary abnormality. Electronically Signed   By: Genevie Ann M.D.   On: 07/13/2020 12:41   DG CHEST PORT 1 VIEW  Result Date: 07/12/2020 CLINICAL DATA:  Left pleural effusion EXAM: PORTABLE CHEST 1 VIEW COMPARISON:  07/11/2020 FINDINGS: Decreasing left pleural effusion with left chest tube in place. Moderate to large left effusion remains with improved aeration in the left lung. Continued left lower lobe atelectasis or infiltrate. No confluent opacity on the right. No pneumothorax. Aortic atherosclerosis. IMPRESSION: Decreasing left effusion. The left effusion remains moderate to large with left lower lobe atelectasis or infiltrate. Left chest tube in place.  No visible pneumothorax. Electronically Signed   By: Rolm Baptise M.D.   On: 07/12/2020 15:56    ASSESSMENT:Plan Status post thoracentesis with left pneumothorax  Scant drainage from chest tube overnight with less than  50 cc No air leak is noted Transition to waterseal 07/14/2020 with follow-up chest x-ray at 1300 for possible chest tube removal and 07/15/2020 to confirm no pneumo  Endobronchial obstruction Pathology is pending   Chronic struct of pulmonary disease chronic respiratory failure on 5 L nasal cannula. Continue current treatment  Further treatment per primary  [place] ACNP Acute Care Nurse Practitioner [place] Please consult [place] 07/14/2020, 9:22 AM

## 2020-07-14 NOTE — Progress Notes (Addendum)
PROGRESS NOTE  Jaime Kelley FMB:[id number] DOB: Dec 21, 1950 DOA: 07/10/2020 PCP: Laurey Morale, MD  HPI/Recap of past 15 hours: 69 year old female with past medical history of obesity, COPD with chronic respiratory failure on 5 L nasal cannula, diabetes mellitus, rheumatoid arthritis, hypertension and PE on chronic Coumadin who presented to the emergency room with shortness of breath on 9/18.  Patient found to be anemic with a hemoglobin of 6.8, was at 13 3 years ago on her chart, subtherapeutic INR of 1.1 and a large left-sided pleural effusion causing near complete collapse of her left lung and requiring a nonrebreather to keep her oxygen saturations greater than 90%.  Pulmonary consulted for thoracentesis and patient was admitted to the hospitalist service.  Patient underwent thoracentesis on 9/19 with approximately 1300 cc of fluid removed, .  Breathing has since improved post procedure x-rays noted persistent complete opacification of left hemithorax.  Pulmonary took patient for bronchoscopy on 9/20 finding solidified mucous plug within the left mainstem which was able to be suctioned clear.  Following, pleural catheter placed.   Patient continues to show signs of improvement.  Follow-up chest x-ray notes reinflation of the lung.  Had extensive conversation with patient's daughter, who lives in [place], on 9/21.  Apparently, the patient lives in a townhome with a roommate.  She has not walked in well over a year although it is from atrophy and deconditioning, not a medical condition.  According to the patient, she has a roommate who cleans her when she has to use the bathroom, as she wears diapers.  The daughter suspects patient is in a hoarding type situation.  The patient affirms that the roommate could not lift her up and can barely care for herself.  She does not want to go to a skilled nursing facility.  Assessment/Plan: Principal Problem: COPD Gold with chronic respiratory failure on 5 L  nasal cannula with large pleural effusion on left causing collapsed lung.  Status post thoracentesis followed by bronchoscopy and resolution of mucous plug and pleural catheter insertion.  Lung reexpanded.  Follow-up x-ray noted resolution of pleural effusion.  Over a liter of fluid out since chest tube has been placed.  Noted blood cultures grew out 1 out of 4 for staph epi, likely contamination.  Pulmonary plans to hopefully take out chest tube on 9/23 Active Problems:    Type 2 diabetes mellitus ([place]): Following CBGs, staying under 180.    Anticoagulated on Coumadin: INR subtherapeutic on admission.  Was on heparin which was held for procedures.  Coumadin restarted 9/21.  According to patient's roommate, the patient often does not take her medications.  Hypothyroidism: Continue Synthroid.  Given social situation, see below, will check TSH and free T4.  Stage 2 decubitus ulcer: from staying in bed.  Non-mobile.  Provide wound care.  microcytic anemia: No evidence of bleeding.  Hemoglobin on admission at 6.5 with an MCV of 63.  Status post transfusion and hemoglobin at 7.7 on 9/20 and has remained stable.  Note drop from previous day at 8.8 although they admitted from hemoconcentration.  Continue to monitor closely.  Checking ferritin level although low iron, but normal TIBC and ferritin makes this likely chronic disease from COPD or hypothyroidism.  Obesity: Patient meets criteria with BMI greater than 30  Essential hypertension: Continue Norvasc  Depression: Continue Zoloft.  We will asked psychiatry to see given her social situation.  See below.  Home situation: I had extensive discussion with the patient's daughter who lives  in [place].  The patient lives in a 2 story townhome with her roommate.  I spoke with the roommate and they have been friends for over 37 years and have lived together for almost 2 decades.  The patient herself suffers from rheumatoid arthritis and refuses to often take her  medications.  The patient has had problems with mobility including a bad knee and rheumatoid arthritis.  She was supposed to see a rheumatologist, but then approximately a year and a half ago, she got into her bed and said that she was not going to see the doctor and according to her roommate and her daughter, the patient has not gotten out of bed since.  The patient denies any pain.  She has become horribly deconditioned to the point where she is no longer mobile.  She does not have another medical condition that would cause this.  According to the roommate, the roommate cleans her as best she can and babysitter and changes her.  The patient uses pads on her bed.  She has not been out of bed to use the commode.  According to her roommate, patient has never fallen because she does not try to get out of bed.  The townhome is in disarray and has not been kept up because the roommate has to take care of herself as well as the patient and there is no time to do anything else.  The patient's son who reportedly lives in [place] has not come by.  Patient seems lucid with no signs of dementia.  It is possible she could be suffering from depression and not taking her Zoloft.  Less psychiatry to evaluate.  Is also possible that she is not taking her Synthroid and will check thyroid function studies.  According to the patient's roommate, sometimes the patient does not feel like taking her medications.  This would certainly account for why her INR was 1.1 on admission. Pt does have stage 2 decub ulcer on R buttock.  While it does not seem to be a good situation, and not sure if the patient can be deemed mentally incompetent or the home situation unlivable.  We will need to see what her thyroid studies and psychiatry evaluation reveals.  When asked the patient about some of these questions and going to details and specifics, she ignores me and does not answer.  She does not seem to have a good answer when I posed her  questions.  That said, she has never fallen since she never leaves the bed.    Thrombocytosis ([place]): In the setting of pleural effusion   Generalized weakness   Pressure injury of skin   Code Status: Full code  Family Communication: Spoke with daughter by phone  Disposition Plan: Seen by PT and recommended for skilled nursing.  The patient herself certainly does not want to go.  Difficult situation as I am not sure she could be deemed incompetent or her home situation unlivable.  Although it is not a good situation, patient himself seems to be decently well cared for.  Might need to ask for a home evaluation and we will asked psychiatry to see and also check to see how her thyroid studies are.   Consultants:  Pulmonary  Procedures:  Status post thoracentesis on 9/19 with 1300 cc fluid removed  Status post bronchoscopy and pigtail catheter placement on 9/20  Antimicrobials:  None  DVT prophylaxis: Anticoagulation on hold until post procedure.  SCDs.   Objective: Vitals:  07/14/20 0800 07/14/20 1134  BP: 104/60 117/66  Pulse: 76   Resp:  18  Temp: 98.2 F (36.8 C) 98.3 F (36.8 C)  SpO2:      Intake/Output Summary (Last 24 hours) at 07/14/2020 1228 Last data filed at 07/13/2020 2013 Gross per 24 hour  Intake 240 ml  Output --  Net 240 ml   Filed Weights   07/12/20 0535 07/12/20 1257 07/13/20 0017  Weight: 78.9 kg 78.9 kg 79.4 kg   Body mass index is 32.01 kg/m.  Exam:   General: Alert and oriented x3, fatigued, no acute distress  HEENT: Normocephalic and atraumatic, mucous membranes are moist  Neck: Thick, narrow airway  Cardiovascular: Regular rate and rhythm, S1-S2  Respiratory: Decreased breath sounds throughout, but better airway aeration on both sides  Abdomen: Soft, obese, nontender, positive bowel sounds  Musculoskeletal: No clubbing or cyanosis, trace pitting edema  Skin: No skin breaks, tears or lesions  Neuro: No focal  deficits  Psychiatry: Appropriate, no evidence of psychoses   Data Reviewed: CBC: Recent Labs  Lab 07/10/20 1759 07/10/20 1759 07/10/20 2132 07/11/20 0226 07/12/20 0611 07/13/20 0845 07/14/20 0735  WBC 6.8  --   --  10.4 10.9* 12.5* 11.3*  NEUTROABS 4.1  --   --   --   --   --   --   HGB 6.5*   < > 8.8* 7.6* 7.7* 7.8* 7.5*  HCT 26.8*   < > 26.0* 28.7* 28.7* 28.6* 28.3*  MCV 63.4*  --   --  67.1* 68.2* 67.1* 68.4*  PLT 607*  --   --  429* 522* 585* 609*   < > = values in this interval not displayed.   Basic Metabolic Panel: Recent Labs  Lab 07/10/20 1759 07/10/20 2132 07/11/20 0226 07/13/20 0845  NA 132* 130* 131* 130*  K 3.3* 3.7 3.7 4.0  CL 90*  --  90* 91*  CO2 30  --  32 30  GLUCOSE 168*  --  146* 89  BUN <5*  --  5* 9  CREATININE 0.52  --  0.50 0.46  CALCIUM 8.6*  --  8.6* 8.5*   GFR: Estimated Creatinine Clearance: 64.8 mL/min (by C-G formula based on SCr of 0.46 mg/dL). Liver Function Tests: Recent Labs  Lab 07/10/20 1759 07/11/20 0226  AST 13* 11*  ALT 8 8  ALKPHOS 56 60  BILITOT 0.4 0.5  PROT 6.6 6.6  ALBUMIN 2.7* 2.7*   No results for input(s): LIPASE, AMYLASE in the last 168 hours. No results for input(s): AMMONIA in the last 168 hours. Coagulation Profile: Recent Labs  Lab 07/10/20 1816 07/11/20 0226 07/14/20 0735  INR 1.1 1.1 1.1   Cardiac Enzymes: No results for input(s): CKTOTAL, CKMB, CKMBINDEX, TROPONINI in the last 168 hours. BNP (last 3 results) No results for input(s): PROBNP in the last 8760 hours. HbA1C: No results for input(s): HGBA1C in the last 72 hours. CBG: Recent Labs  Lab 07/10/20 1835 07/11/20 0019 07/11/20 1129  GLUCAP 156* 169* 117*   Lipid Profile: No results for input(s): CHOL, HDL, LDLCALC, TRIG, CHOLHDL, LDLDIRECT in the last 72 hours. Thyroid Function Tests: No results for input(s): TSH, T4TOTAL, FREET4, T3FREE, THYROIDAB in the last 72 hours. Anemia Panel: No results for input(s): VITAMINB12,  FOLATE, FERRITIN, TIBC, IRON, RETICCTPCT in the last 72 hours. Urine analysis:    Component Value Date/Time   COLORURINE YELLOW 01/18/2017 [place] 01/18/2017 1451   LABSPEC 1.008 01/18/2017 1451  PHURINE 6.0 01/18/2017 1451   GLUCOSEU NEGATIVE 01/18/2017 1451   HGBUR SMALL (A) 01/18/2017 1451   BILIRUBINUR NEGATIVE 01/18/2017 1451   BILIRUBINUR n 02/03/2015 1507   KETONESUR NEGATIVE 01/18/2017 1451   PROTEINUR NEGATIVE 01/18/2017 1451   UROBILINOGEN 0.2 02/03/2015 1507   NITRITE NEGATIVE 01/18/2017 1451   LEUKOCYTESUR LARGE (A) 01/18/2017 1451   Sepsis Labs: @LABRCNTIP (procalcitonin:4,lacticidven:4)  ) Recent Results (from the past 240 hour(s))  SARS Coronavirus 2 by RT PCR (hospital order, performed in [facility] hospital lab) Nasopharyngeal Nasopharyngeal Swab     Status: None   Collection Time: 07/10/20  5:35 PM   Specimen: Nasopharyngeal Swab  Result Value Ref Range Status   SARS Coronavirus 2 NEGATIVE NEGATIVE Final    Comment: (NOTE) SARS-CoV-2 target nucleic acids are NOT DETECTED.  The SARS-CoV-2 RNA is generally detectable in upper and lower respiratory specimens during the acute phase of infection. The lowest concentration of SARS-CoV-2 viral copies this assay can detect is 250 copies / mL. A negative result does not preclude SARS-CoV-2 infection and should not be used as the sole basis for treatment or other patient management decisions.  A negative result may occur with improper specimen collection / handling, submission of specimen other than nasopharyngeal swab, presence of viral mutation(s) within the areas targeted by this assay, and inadequate number of viral copies (<250 copies / mL). A negative result must be combined with clinical observations, patient history, and epidemiological information.  Fact Sheet for Patients:   StrictlyIdeas.no  Fact Sheet for Healthcare  Providers: BankingDealers.co.za  This test is not yet approved or  cleared by the [place] FDA and has been authorized for detection and/or diagnosis of SARS-CoV-2 by FDA under an Emergency Use Authorization (EUA).  This EUA will remain in effect (meaning this test can be used) for the duration of the COVID-19 declaration under Section 564(b)(1) of the Act, 21 U.S.C. section 360bbb-3(b)(1), unless the authorization is terminated or revoked sooner.  Performed at [facility], [place] [address], [place], [place] [address]   Culture, blood (routine x 2)     Status: Abnormal   Collection Time: 07/10/20  5:36 PM   Specimen: BLOOD  Result Value Ref Range Status   Specimen Description BLOOD LEFT ANTECUBITAL  Final   Special Requests   Final    BOTTLES DRAWN AEROBIC AND ANAEROBIC Blood Culture adequate volume   Culture  Setup Time   Final    GRAM POSITIVE COCCI IN CLUSTERS IN BOTH AEROBIC AND ANAEROBIC BOTTLES CRITICAL RESULT CALLED TO, READ BACK BY AND VERIFIED WITH: PHARMD K PIERCE [id number] FCP    Culture (A)  Final    STAPHYLOCOCCUS SIMULANS STAPHYLOCOCCUS CAPITIS STAPHYLOCOCCUS EPIDERMIDIS THE SIGNIFICANCE OF ISOLATING THIS ORGANISM FROM A SINGLE SET OF BLOOD CULTURES WHEN MULTIPLE SETS ARE DRAWN IS UNCERTAIN. PLEASE NOTIFY THE MICROBIOLOGY DEPARTMENT WITHIN ONE WEEK IF SPECIATION AND SENSITIVITIES ARE REQUIRED. Performed at [facility], [place] [address], [place], [place] [address]    Report Status 07/13/2020 FINAL  Final  Blood Culture ID Panel (Reflexed)     Status: Abnormal   Collection Time: 07/10/20  5:36 PM  Result Value Ref Range Status   Enterococcus faecalis NOT DETECTED NOT DETECTED Final   Enterococcus Faecium NOT DETECTED NOT DETECTED Final   Listeria monocytogenes NOT DETECTED NOT DETECTED Final   Staphylococcus species DETECTED (A) NOT DETECTED Final    Comment: CRITICAL RESULT CALLED TO, READ BACK BY AND VERIFIED WITH: PHARMD K  PIERCE [id number] FCP    Staphylococcus aureus (BCID) NOT DETECTED NOT DETECTED Final   Staphylococcus epidermidis DETECTED (A) NOT DETECTED Final    Comment: CRITICAL RESULT CALLED TO, READ BACK BY AND VERIFIED WITH: PHARMD K PIERCE [id number] FCP    Staphylococcus lugdunensis NOT DETECTED NOT DETECTED Final   Streptococcus species NOT DETECTED NOT DETECTED Final   Streptococcus agalactiae NOT DETECTED NOT DETECTED Final   Streptococcus pneumoniae NOT DETECTED NOT DETECTED Final   Streptococcus pyogenes NOT DETECTED NOT DETECTED Final   A.calcoaceticus-baumannii NOT DETECTED NOT DETECTED Final   Bacteroides fragilis NOT DETECTED NOT DETECTED Final   Enterobacterales NOT DETECTED NOT DETECTED Final   Enterobacter cloacae complex NOT DETECTED NOT DETECTED Final   Escherichia coli NOT DETECTED NOT DETECTED Final   Klebsiella aerogenes NOT DETECTED NOT DETECTED Final   Klebsiella oxytoca NOT DETECTED NOT DETECTED Final   Klebsiella pneumoniae NOT DETECTED NOT DETECTED Final   Proteus species NOT DETECTED NOT DETECTED Final   Salmonella species NOT DETECTED NOT DETECTED Final   Serratia marcescens NOT DETECTED NOT DETECTED Final   Haemophilus influenzae NOT DETECTED NOT DETECTED Final   Neisseria meningitidis NOT DETECTED NOT DETECTED Final   Pseudomonas aeruginosa NOT DETECTED NOT DETECTED Final   Stenotrophomonas maltophilia NOT DETECTED NOT DETECTED Final   Candida albicans NOT DETECTED NOT DETECTED Final   Candida auris NOT DETECTED NOT DETECTED Final   Candida glabrata NOT DETECTED NOT DETECTED Final   Candida krusei NOT DETECTED NOT DETECTED Final   Candida parapsilosis NOT DETECTED NOT DETECTED Final   Candida tropicalis NOT DETECTED NOT DETECTED Final   Cryptococcus neoformans/gattii NOT DETECTED NOT DETECTED Final   Methicillin resistance mecA/C NOT DETECTED NOT DETECTED Final    Comment: Performed at [facility], [place] [address] [place], [place] [address]  Culture,  blood (routine x 2)     Status: None (Preliminary result)   Collection Time: 07/10/20  6:47 PM   Specimen: BLOOD RIGHT FOREARM  Result Value Ref Range Status   Specimen Description BLOOD RIGHT FOREARM  Final   Special Requests   Final    BOTTLES DRAWN AEROBIC AND ANAEROBIC Blood Culture adequate volume   Culture   Final    NO GROWTH 3 DAYS Performed at [facility], [place] [address] [place], [place] [address]    Report Status PENDING  Incomplete  Body fluid culture (includes gram stain)     Status: None   Collection Time: 07/11/20  9:30 AM   Specimen: Pleural Fluid  Result Value Ref Range Status   Specimen Description PLEURAL FLUID  Final   Special Requests LEFT  Final   Gram Stain   Final    WBC PRESENT,BOTH PMN AND MONONUCLEAR NO ORGANISMS SEEN CYTOSPIN SMEAR    Culture   Final    NO GROWTH 3 DAYS Performed at [facility], [place] [address], [place], [place] [address]    Report Status 07/14/2020 FINAL  Final      Studies: DG Chest Port 1 View  Result Date: 07/14/2020 CLINICAL DATA:  Chest tube surveillance EXAM: PORTABLE CHEST 1 VIEW COMPARISON:  07/13/2020 FINDINGS: Left-sided chest tube remains in place. Stable cardiomegaly. Aortic atherosclerosis. Suspect small residual left-sided pleural effusion. Persistent bibasilar atelectasis. No pneumothorax is seen. IMPRESSION: 1. Left-sided chest tube remains in place. No pneumothorax. 2. Persistent bibasilar atelectasis and probable small residual left pleural effusion. Electronically Signed   By: Davina Poke D.O.   On:  07/14/2020 08:18    Scheduled Meds: . amLODipine  10 mg Oral QHS  . enoxaparin (LOVENOX) injection  40 mg Subcutaneous Daily  . ipratropium-albuterol  3 mL Nebulization TID  . levothyroxine  137 mcg Oral Q0600  . losartan  100 mg Oral QHS  . sertraline  100 mg Oral QHS  . warfarin  10 mg Oral ONCE-1600  . Warfarin - Pharmacist Dosing Inpatient   Does not apply q1600    Continuous  Infusions:   LOS: 4 days     Annita Brod, MD [facility]   07/14/2020, 12:28 PM

## 2020-07-14 NOTE — Progress Notes (Signed)
Nutrition Follow-up  DOCUMENTATION CODES:   Obesity unspecified  INTERVENTION:   -Magic cup TID with meals, each supplement provides 290 kcal and 9 grams of protein -MVI with minerals daily  NUTRITION DIAGNOSIS:   Inadequate oral intake related to inability to eat as evidenced by NPO status.  Progressing; advanced to heart healthy/ carb modified diet on 07/13/20  GOAL:   Patient will meet greater than or equal to 90% of their needs  Progressing   MONITOR:   PO intake, Supplement acceptance, Labs, Weight trends, Skin, I & O's  REASON FOR ASSESSMENT:   Low [place]    ASSESSMENT:   Jaime Kelley is a 69 y.o. 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  9/19- s/p thoracentesis (1300 ml straw clear fluid removed from lt pleural site) 9/21- s/p Flexible video fiberoptic bronchoscopy and biopsies (revealed mucous plugging); lt chest tube placed  Reviewed I/O's: -240 ml x 24 hours and +537 ml since admission  Chest tube output: 600 ml x 24 hours  Case discussed with RN, who reports pt with good appetite. Chest tube on water seal. Per RN, pt is very HOH.   Spoke with pt at bedside, who reports feeling better today. She endorses good appetite- consumed about 75% of [place] sandwich on lunch tray. She denies any difficulty chewing or swallowing. Noted meal completion documented at 25-100%.   Per TOC notes, therapies recommending SNF placement.   Labs reviewed: Na: 130.   Diet Order:   Diet Order            Diet heart healthy/carb modified Room service appropriate? Yes; Fluid consistency: Thin  Diet effective now                 EDUCATION NEEDS:   Not appropriate for education at this time  Skin:  Skin Assessment: Skin Integrity Issues: Skin Integrity Issues::  Stage II Stage II: lt buttocks  Last BM:  07/13/20  Height:   Ht Readings from Last 1 Encounters:  07/12/20 5\' 2"  (1.575 m)    Weight:   Wt Readings from Last 1 Encounters:  07/13/20 79.4 kg    Ideal Body Weight:  50 kg  BMI:  Body mass index is 32.01 kg/m.  Estimated Nutritional Needs:   Kcal:  1800-2000  Protein:  100-115 grams  Fluid:  > 1.8 L    Loistine Chance, RD, LDN, [place] Registered Dietitian II Certified Diabetes Care and Education Specialist Please refer to [facility] for RD and/or RD on-call/weekend/after hours pager

## 2020-07-14 NOTE — Evaluation (Signed)
Occupational Therapy Evaluation Patient Details Name: Jaime Kelley MRN: [id number] DOB: [birth year] Today's Date: 07/14/2020    History of Present Illness 69 year old female with past medical history of obesity, COPD with chronic respiratory failure on 5 L nasal cannula, diabetes mellitus, rheumatoid arthritis, hypertension and PE on chronic Coumadin who presented to the emergency room with shortness of breath on 9/18.  Patient found to be anemic with a hemoglobin of 6.8.Patient underwent thoracentesis on 9/19 with approximately 1300 cc of fluid removed.   Clinical Impression   Patient admitted with the above diagnosis.  Per the chart, the patient is largely bed bound and requires near total assist with all care, toileting, meds excluding self feeding and light grooming.  Patient is unable to participate with home management or meal prep.  It is unclear how she maintains any MD appointments.  Her helper is her 64 yo roommate and long time friend.  Patient admits this roommate is really unable to care for her.  When asked what her plan is for discharge, the patient simply wants to go back home, despite the roommates inability to assist her.  The patient is appropriate for SNF placement for trial of rehab to determine LTC versus a return home.  OT will follow in the acute setting, but it is unclear what progress can be made.  Functionally, she is probably performing around her baseline.  Equipment recommendations are for ? Discharge home.      Follow Up Recommendations  SNF    Equipment Recommendations  3 in 1 bedside commode;Wheelchair (measurements OT);Wheelchair cushion (measurements OT);Hospital bed, hoyer lift, HH OT, [facility] aide for bathing/community based home services.     Recommendations for Other Services       Precautions / Restrictions Precautions Precautions: Fall Precaution Comments: chest tube Restrictions Weight Bearing Restrictions: No      Mobility Bed Mobility Overal  bed mobility: Needs Assistance Bed Mobility: Rolling Rolling: Mod assist   Supine to sit: Mod assist Sit to supine: Mod assist   General bed mobility comments: assist for LEs  Transfers Overall transfer level: Needs assistance   Transfers: Sit to/from Stand Sit to Stand: Total assist         General transfer comment: Patient continues with limited ability to assist.  Sat EOB 1 min and began leaning to R    Balance Overall balance assessment: Needs assistance Sitting-balance support: Single extremity supported Sitting balance-[name]: Fair                                     ADL either performed or assessed with clinical judgement   ADL Overall ADL's : Needs assistance/impaired Eating/Feeding: Set up;Bed level   Grooming: Wash/dry hands;Wash/dry face;Set up;Bed level   Upper Body Bathing: Maximal assistance;Bed level   Lower Body Bathing: Total assistance;Bed level   Upper Body Dressing : Maximal assistance;Bed level   Lower Body Dressing: Total assistance;Bed level       Toileting- Clothing Manipulation and Hygiene: Total assistance;Bed level       Functional mobility during ADLs: Total assistance       Vision Baseline Vision/History: Wears glasses Patient Visual Report: No change from baseline Vision Assessment?: No apparent visual deficits     Perception     Praxis      Pertinent Vitals/Pain Faces Pain Scale: Hurts a little bit Pain Location: side with chest tube Pain Descriptors / Indicators: Sore  Pain Intervention(s): Monitored during session     Hand Dominance Right   Extremity/Trunk Assessment Upper Extremity Assessment Upper Extremity Assessment: Generalized weakness (Decreased end range to B UE shoulder flexion)       Cervical / Trunk Assessment Cervical / Trunk Assessment: Kyphotic   Communication Communication Communication: No difficulties   Cognition Arousal/Alertness: Awake/alert Behavior During  Therapy: WFL for tasks assessed/performed Overall Cognitive Status: No family/caregiver present to determine baseline cognitive functioning                                     General Comments       Exercises     Shoulder Instructions      Home Living Family/patient expects to be discharged to:: Private residence Living Arrangements: Other (Comment) Available Help at Discharge: Friend(s);Available 24 hours/day Type of Home: House Home Access: Stairs to enter [employer] of Steps: 3 Entrance Stairs-Rails: Right Home Layout: Two level;Bed/bath upstairs;1/2 bath on main level Alternate Level Stairs-Number of Steps: 14 Alternate Level Stairs-Rails: Right Bathroom Shower/Tub: Occupational psychologist: Standard     Home Equipment: Environmental consultant - 2 wheels   Additional Comments: 5LO2 at baseline      Prior Functioning/Environment Level of Independence: Needs assistance  Gait / Transfers Assistance Needed: Patient has not been oup of the bed for "awhile" ADL's / Homemaking Assistance Needed: Roommate cares for all ADL, toilet, hojme management and meal prep.  Roommate assists with meds. Communication / Swallowing Assistance Needed: none Comments: roommate does all grocery shopping.        OT Problem List: Decreased strength;Decreased range of motion;Decreased activity tolerance;Impaired balance (sitting and/or standing);Decreased safety awareness;Decreased cognition;Pain;Obesity      OT Treatment/Interventions: Self-care/ADL training;Therapeutic exercise;Energy conservation;DME and/or AE instruction;Therapeutic activities;Balance training    OT Goals(Current goals can be found in the care plan section) Acute Rehab OT Goals Patient Stated Goal: Go home OT Goal Formulation: With patient Time For Goal Achievement: 08/02/20 Potential to Achieve Goals: Fair ADL Goals Pt Will Perform Grooming: with min guard assist;sitting Pt Will Perform Upper Body  Bathing: with min assist;sitting Pt Will Perform Upper Body Dressing: with min assist;sitting Pt Will Transfer to Toilet: with mod assist;squat pivot transfer  OT Frequency: Min 2X/week   Barriers to D/C: Decreased caregiver support          Co-evaluation              AM-PAC OT "6 Clicks" Daily Activity     Outcome Measure Help from another person eating meals?: A Little Help from another person taking care of personal grooming?: A Little Help from another person toileting, which includes using toliet, bedpan, or urinal?: Total Help from another person bathing (including washing, rinsing, drying)?: A Lot Help from another person to put on and taking off regular upper body clothing?: A Lot Help from another person to put on and taking off regular lower body clothing?: Total 6 Click Score: 12   End of Session Equipment Utilized During Treatment: Gait belt  Activity Tolerance: Patient limited by fatigue Patient left: in bed;with call bell/phone within reach;Other (comment) (RT in room for breathing treatment.Marland Kitchen)  OT Visit Diagnosis: Unsteadiness on feet (R26.81);Muscle weakness (generalized) (M62.81);Other symptoms and signs involving cognitive function;Dizziness and giddiness (R42);Pain Pain - Right/Left: Left Pain - part of body:  (flank)  Time: 1350-1416 OT Time Calculation (min): 26 min Charges:  OT General Charges $OT Visit: 1 Visit OT Evaluation $OT Eval Moderate Complexity: 1 Mod OT Treatments $Therapeutic Activity: 8-22 mins  07/14/2020  Rich, OTR/L  Acute Rehabilitation Services  Office:  [place] 07/14/2020, 2:41 PM

## 2020-07-14 NOTE — Progress Notes (Signed)
ANTICOAGULATION CONSULT NOTE - Initial Consult  Pharmacy Consult for Coumadin Indication: h/o multiple dVTs and PEs  Allergies  Allergen Reactions  . Effexor [Venlafaxine Hydrochloride] Rash    Patient Measurements: Height: 5\' 2"  (157.5 cm) Weight: 79.4 kg (175 lb) IBW/kg (Calculated) : 50.1  Vital Signs: Temp: 98.2 F (36.8 C) (09/22 0800) Temp Source: Oral (09/22 0800) BP: 104/60 (09/22 0800) Pulse Rate: 76 (09/22 0800)  Labs: Recent Labs    07/12/20 0611 07/12/20 0611 07/13/20 0845 07/14/20 0735  HGB 7.7*   < > 7.8* 7.5*  HCT 28.7*  --  28.6* 28.3*  PLT 522*  --  585* 609*  LABPROT  --   --   --  13.7  INR  --   --   --  1.1  CREATININE  --   --  0.46  --    < > = values in this interval not displayed.    Estimated Creatinine Clearance: 64.8 mL/min (by C-G formula based on SCr of 0.46 mg/dL).   Medical History: Past Medical History:  Diagnosis Date  . Anxiety   . COPD (chronic obstructive pulmonary disease) ([place])   . Depression   . Diabetes mellitus without complication ([place])   . Discoid lupus    sees Dr. Allyson Sabal  . Dyspnea   . GERD (gastroesophageal reflux disease)   . History of colonic polyps   . Hx pulmonary embolism   . Hypertension   . Hypothyroidism   . Rheumatoid arthritis(714.0)    sees Dr. Dossie Der   . Thyroid disease     Assessment: Anticoag: H/o DVT and PE (2008,2012,2014) on warfarin. LD 9/17. INR still 1.1 Hgb 7.5. Plts 609 rising. Add Lov40/d - PTA regimen 7.5 mg daily except 3.75 mg on Wed (INR 1.1 on admit)  Goal of Therapy:  INR 2-3 Monitor platelets by anticoagulation protocol: Yes   Plan:  Lovenox 40mg /d Coumadin 10mg  po x 1 tonight Daily INR   Jubilee Vivero S. Alford Highland, PharmD, BCPS Clinical Staff Pharmacist [email or web address] Alford Highland, Mayumi Summerson Stillinger 07/14/2020,10:04 AM

## 2020-07-14 NOTE — Plan of Care (Signed)
  Problem: Clinical Measurements: Goal: Respiratory complications will improve Outcome: Progressing   Problem: Coping: Goal: Level of anxiety will decrease Outcome: Progressing   

## 2020-07-14 NOTE — Consult Note (Signed)
   [facility] Inpatient Consult   07/14/2020  Jaime Kelley 11/01/1950 [id number]   [place] [employer] [ACO] Patient: Medicare NextGen   Patient screened to assess for potential [place] Management service needs.  Review of patient's medical record reveals patient is currently being recommended for skilled nursing facility for transition.  Patient was discussed in unit progression meeting to identify barriers to care transition.  Primary Care Provider is Laurey Morale, MD with [facility] Primary Care this provider is listed to provide the transition of care [TOC] for post hospital follow up.  Discussion for post hospital issues with patient decondition and for needs.   Patient is currently working with therapy.   Plan:  Continue to follow progress and disposition with inpatient [facility] team. Please place a [facility] Care Management consult as appropriate and for questions contact:   [name], RN BSN [facility] Liaison  [phone] business mobile phone Toll free office [phone]  Fax number: [phone] [name]

## 2020-07-15 ENCOUNTER — Inpatient Hospital Stay (HOSPITAL_COMMUNITY): Payer: Medicare Other

## 2020-07-15 ENCOUNTER — Telehealth: Payer: Self-pay | Admitting: Internal Medicine

## 2020-07-15 LAB — CULTURE, BLOOD (ROUTINE X 2)
Culture: NO GROWTH
Special Requests: ADEQUATE

## 2020-07-15 LAB — CBC
HCT: 28.2 % — ABNORMAL LOW (ref 36.0–46.0)
Hemoglobin: 7.5 g/dL — ABNORMAL LOW (ref 12.0–15.0)
MCH: 18 pg — ABNORMAL LOW (ref 26.0–34.0)
MCHC: 26.6 g/dL — ABNORMAL LOW (ref 30.0–36.0)
MCV: 67.8 fL — ABNORMAL LOW (ref 80.0–100.0)
Platelets: 532 10*3/uL — ABNORMAL HIGH (ref 150–400)
RBC: 4.16 MIL/uL (ref 3.87–5.11)
RDW: 29.7 % — ABNORMAL HIGH (ref 11.5–15.5)
WBC: 10 10*3/uL (ref 4.0–10.5)
nRBC: 0 % (ref 0.0–0.2)

## 2020-07-15 LAB — URINALYSIS, ROUTINE W REFLEX MICROSCOPIC
Bilirubin Urine: NEGATIVE
Glucose, UA: NEGATIVE mg/dL
Ketones, ur: NEGATIVE mg/dL
Nitrite: NEGATIVE
Protein, ur: 100 mg/dL — AB
Specific Gravity, Urine: 1.02 (ref 1.005–1.030)
pH: 8 (ref 5.0–8.0)

## 2020-07-15 LAB — T4, FREE: Free T4: 0.99 ng/dL (ref 0.61–1.12)

## 2020-07-15 LAB — URINALYSIS, MICROSCOPIC (REFLEX): WBC, UA: >50 WBC/hpf (ref 0–5)

## 2020-07-15 LAB — GLUCOSE, CAPILLARY: Glucose-Capillary: 132 mg/dL — ABNORMAL HIGH (ref 70–99)

## 2020-07-15 LAB — PROTIME-INR
INR: 1.3 — ABNORMAL HIGH (ref 0.8–1.2)
Prothrombin Time: 15.9 seconds — ABNORMAL HIGH (ref 11.4–15.2)

## 2020-07-15 LAB — TSH: TSH: 28.727 u[IU]/mL — ABNORMAL HIGH (ref 0.350–4.500)

## 2020-07-15 MED ORDER — AMLODIPINE BESYLATE 10 MG PO TABS
10.0000 mg | ORAL_TABLET | Freq: Every day | ORAL | Status: DC
  Administered 2020-07-15 – 2020-07-20 (×6): 10 mg via ORAL
  Filled 2020-07-15 (×6): qty 1

## 2020-07-15 MED ORDER — IPRATROPIUM-ALBUTEROL 0.5-2.5 (3) MG/3ML IN SOLN
3.0000 mL | Freq: Two times a day (BID) | RESPIRATORY_TRACT | Status: DC
  Administered 2020-07-15 – 2020-07-19 (×9): 3 mL via RESPIRATORY_TRACT
  Filled 2020-07-15 (×10): qty 3

## 2020-07-15 MED ORDER — WARFARIN SODIUM 7.5 MG PO TABS
7.5000 mg | ORAL_TABLET | Freq: Once | ORAL | Status: AC
  Administered 2020-07-15: 7.5 mg via ORAL
  Filled 2020-07-15: qty 1

## 2020-07-15 NOTE — Telephone Encounter (Signed)
Candee Furbish, MD  P Lbpu Triage Pool Next available with MW or another provider for routine COPD f/u, nonurgent   Erskine Emery MD PCCM    Above is staff message sent by Dr. Tamala Julian.  Checked pt's chart and pt is currently still admitted at [facility]. Pt has been in the hospital since 07/10/20.  Dr. Tamala Julian, please advise if you might know about when pt will be discharged from the hospital as I do not want to schedule an appt too soon.

## 2020-07-15 NOTE — Telephone Encounter (Signed)
I have scheduled pt a hospital follow up appt 07/20/20 with Tammy Parrett at 2pm. Pt's appt will show up on the AVS once she is discharged from the hospital.  If pt needs to cancel the appt for any reason, she can call the office to cancel the appt. Nothing further needed.

## 2020-07-15 NOTE — Consult Note (Signed)
[place] Nurse Consult Note: Patient receiving care in [place] Reason for Consult: Left buttock wound Wound type: Partial thickness related to MASD/IAD, friction Pressure Injury POA: NA Measurement:  2 distinct wound areas present, both pink, partial thickness. Most medial wound is 0.8 cm x 0.6cm. More lateral wound measures 1.2 cm x 1 cm Wound bed: Pink, moist Drainage (amount, consistency, odor) None Periwound: Epithelial island between the wounds is macerated and rumpled from friction. Dressing procedure/placement/frequency: Clean buttock area with soap and water. Pat dry. Apply 50M skin barrier wipes to wound area and skin surrounding wound, allow to air dry. Apply one Hydrocolloid dressing Kellie Simmering # [phone]). Change daily.   Monitor the wound area(s) for worsening of condition such as: Signs/symptoms of infection,  Increase in size,  Development of or worsening of odor, Development of pain, or increased pain at the affected locations.   Notify the medical team if any of these develop.  Thank you for the consult.  Discussed plan of care with the patient and bedside nurse.  [place] nurse will not follow at this time.   Please re-consult the [place] team if needed.  Cathlean Marseilles Tamala Julian, MSN, RN, [place], Lysle Pearl, [facility] Wound Treatment Associate Office [phone]  Pager [phone]

## 2020-07-15 NOTE — Progress Notes (Signed)
[place] for Coumadin Indication: h/o multiple dVTs and PEs  Allergies  Allergen Reactions  . Effexor [Venlafaxine Hydrochloride] Rash    Patient Measurements: Height: 5\' 2"  (157.5 cm) Weight: 79.4 kg (175 lb 0.7 oz) IBW/kg (Calculated) : 50.1  Vital Signs: Temp: 98.1 F (36.7 C) (09/23 1133) Temp Source: Oral (09/23 1133) BP: 111/58 (09/23 1133) Pulse Rate: 76 (09/23 0758)  Labs: Recent Labs    07/13/20 0845 07/13/20 0845 07/14/20 0735 07/15/20 1000  HGB 7.8*   < > 7.5* 7.5*  HCT 28.6*  --  28.3* 28.2*  PLT 585*  --  609* 532*  LABPROT  --   --  13.7 15.9*  INR  --   --  1.1 1.3*  CREATININE 0.46  --   --   --    < > = values in this interval not displayed.    Estimated Creatinine Clearance: 64.8 mL/min (by C-G formula based on SCr of 0.46 mg/dL).   Medical History: Past Medical History:  Diagnosis Date  . Anxiety   . COPD (chronic obstructive pulmonary disease) ([place])   . Depression   . Diabetes mellitus without complication ([place])   . Discoid lupus    sees Dr. Allyson Sabal  . Dyspnea   . GERD (gastroesophageal reflux disease)   . History of colonic polyps   . Hx pulmonary embolism   . Hypertension   . Hypothyroidism   . Rheumatoid arthritis(714.0)    sees Dr. Dossie Der   . Thyroid disease     Assessment: 84 yof w/ h/o DVT and PE (2008,2012,2014) on warfarin. LD PTA 9/17. On concurrent enoxaparin while INR low.   PTA regimen 7.5 mg daily except 3.75 mg on Wed (INR 1.1 on admit)  INR today is subtherapeutic at 1.1>1.3, on enoxaparin 40 mg daily while low, has received 2 doses of warfarin. Hgb 7.5, plt 532. No s/sx of bleeding.   Goal of Therapy:  INR 2-3 Monitor platelets by anticoagulation protocol: Yes   Plan:  Lovenox 40mg /d Coumadin 7.5 mg po x 1 tonight Daily INR  Antonietta Jewel, PharmD, BCCCP Clinical Pharmacist  Phone: [phone] 07/15/2020 12:16 PM  Please check AMION for all [place] phone  numbers After 10:00 PM, call [place] [phone]

## 2020-07-15 NOTE — Progress Notes (Signed)
Practitioner attempted to assess patient today. Was reported there were no available ipads. Will attempt to round on this patient in person. I was also made aware of staffing crisis, and limited availability to leave the floor.

## 2020-07-15 NOTE — Progress Notes (Signed)
07/15/2020 Pulmonary Progress Note  Seen in f/u for chronic mucus plugging of left bronchi with sympathic effusion.  No events overnight, minimal chest tube output.  On exam, minimal crackles on left, breathing comfortably on 5L O2.  Chest tube without tidaling or air leak.  Lung up on CXR with noresidual fluid.  A Acute on chronic hypoxemic respiratory failure from -Chronic mucus plugging of left mainstem -Left lung collapse from above and associated effusion post chest tube evacuation  P: - Remove chest tube - Will arrange f/u with Dr. Melvyn Novas or new PCCM provider as OP in a couple months - Patient back to home O2 needs, PCCM will sign off, call if we can be of further assistance  Erskine Emery MD PCCM

## 2020-07-15 NOTE — Telephone Encounter (Signed)
Should be discharged by 9/25

## 2020-07-15 NOTE — Progress Notes (Signed)
PROGRESS NOTE    Jaime Kelley  TXM:[id number] DOB: 04/14/1951 DOA: 07/10/2020 PCP: Laurey Morale, MD    Brief Narrative:  69 year old female with past medical history of obesity, COPD with chronic respiratory failure on 5 L nasal cannula, diabetes mellitus, rheumatoid arthritis, hypertension and PE on chronic Coumadin who presented to the emergency room with shortness of breath on 9/18.  Patient found to be anemic with a hemoglobin of 6.8, was at 13 3 years ago on her chart, subtherapeutic INR of 1.1 and a large left-sided pleural effusion causing near complete collapse of her left lung and requiring a nonrebreather to keep her oxygen saturations greater than 90%.  Pulmonary consulted for thoracentesis and patient was admitted to the hospitalist service.  Patient underwent thoracentesis on 9/19 with approximately 1300 cc of fluid removed, .  Breathing has since improved post procedure x-rays noted persistent complete opacification of left hemithorax.  Pulmonary took patient for bronchoscopy on 9/20 finding solidified mucous plug within the left mainstem which was able to be suctioned clear.  Following, pleural catheter placed.   Patient continues to show signs of improvement.  Follow-up chest x-ray notes reinflation of the lung.  Had extensive conversation with patient's daughter, who lives in [place], on 9/21.  Apparently, the patient lives in a townhome with a roommate.  She has not walked in well over a year although it is from atrophy and deconditioning, not a medical condition.  According to the patient, she has a roommate who cleans her when she has to use the bathroom, as she wears diapers.  The daughter suspects patient is in a hoarding type situation.  The patient affirms that the roommate could not lift her up and can barely care for herself.  She does not want to go to a skilled nursing facility.  Assessment & Plan:   Principal Problem:   Pleural effusion on left Active Problems:    Hypothyroidism   COPD GOLD III   Rheumatoid arthritis ([place])   Essential hypertension   Type 2 diabetes mellitus (HCC)   Anticoagulated on Coumadin   Collapsed lung   Microcytic anemia   Chest pain   Thrombocytosis (HCC)   Generalized weakness   Stage II pressure ulcer of right buttock (HCC)   Obesity (BMI 30-39.9)   Mucus plugging of bronchi  Principal Problem: COPD Gold with chronic respiratory failure on 5 L nasal cannula with large pleural effusion on left causing collapsed lung.  Status post thoracentesis followed by bronchoscopy and resolution of mucous plug and pleural catheter insertion.  Lung reexpanded.  Follow-up x-ray noted resolution of pleural effusion.    Noted blood cultures grew out 1 out of 4 for staph epi, likely contamination.  Chest tube now d/c'd by pulmonary on 9/23. Pt to f/u with Pulmonary as outpatient  Active Problems:    Type 2 diabetes mellitus ([place]): Glucose trends stable thus far    Anticoagulated on Coumadin: INR subtherapeutic on admission.  Was on heparin which was held for procedures.  Coumadin restarted 9/21. Concern made for noncompliance with meds prior to admit  Hypothyroidism: Continue Synthroid.  TSH elevated at 28.727. T4 levels within normal limits. See above. Concerns were raised for noncompliance with meds. Will cont thyroid replacement, recommend repeating TSH in 4-6 weeks  Stage 2 decubitus ulcer: from staying in bed.  Non-mobile.  Provide wound care.  microcytic anemia: No evidence of bleeding.  Hemoglobin on admission at 6.5 with an MCV of 63.  Status post transfusion  and hemoglobin at 7.7 on 9/20 and has remained stable.  Hgb remains stable. Remains hemodynamically stabel  Obesity: Patient meets criteria with BMI greater than 30  Essential hypertension: Continue Norvasc  Depression: Continue Zoloft.  Psychiatry to see given her social situation and to establish patient's decision making capacity, pending  DVT prophylaxis:  coumadin Code Status: Full Family Communication: Pt in room, family not at bedside  Status is: Inpatient  Remains inpatient appropriate because:Unsafe d/c plan   Dispo: The patient is from: Home              Anticipated d/c is to: SNF              Anticipated d/c date is: 2 days              Patient currently is medically stable to d/c. Just pending disposition       Consultants:   Pulmonary  Psychiatry  Procedures:   Status post thoracentesis on 9/19 with 1300 cc fluid removed  Status post bronchoscopy and pigtail catheter placement on 9/20  Chest tube removed 9/23  Antimicrobials: Anti-infectives (From admission, onward)   None       Subjective: Repeats wanting to go home  Objective: Vitals:   07/15/20 0758 07/15/20 1132 07/15/20 1133 07/15/20 1340  BP: (!) 97/55  (!) 111/58 (!) 99/58  Pulse: 76   83  Resp: 18 18  17   Temp: 98.2 F (36.8 C) 98.1 F (36.7 C) 98.1 F (36.7 C) 98.8 F (37.1 C)  TempSrc:  Oral Oral Oral  SpO2: 97%  95% 98%  Weight:      Height:        Intake/Output Summary (Last 24 hours) at 07/15/2020 1444 Last data filed at 07/15/2020 1017 Gross per 24 hour  Intake 600 ml  Output 210 ml  Net 390 ml   Filed Weights   07/12/20 1257 07/13/20 0017 07/15/20 0400  Weight: 78.9 kg 79.4 kg 79.4 kg    Examination:  General exam: Appears calm and comfortable  Respiratory system: Clear to auscultation. Respiratory effort normal. Cardiovascular system: S1 & S2 heard, Regular Gastrointestinal system: Abdomen is nondistended, soft and nontender. No organomegaly or masses felt. Normal bowel sounds heard. Central nervous system: Alert and oriented. No focal neurological deficits. Extremities: Symmetric 5 x 5 power. Skin: No rashes, lesions Psychiatry: Judgement and insight appear normal. Mood & affect appropriate.   Data Reviewed: I have personally reviewed following labs and imaging studies  CBC: Recent Labs  Lab 07/10/20 1759  07/10/20 2132 07/11/20 0226 07/12/20 0611 07/13/20 0845 07/14/20 0735 07/15/20 1000  WBC 6.8   < > 10.4 10.9* 12.5* 11.3* 10.0  NEUTROABS 4.1  --   --   --   --   --   --   HGB 6.5*   < > 7.6* 7.7* 7.8* 7.5* 7.5*  HCT 26.8*   < > 28.7* 28.7* 28.6* 28.3* 28.2*  MCV 63.4*   < > 67.1* 68.2* 67.1* 68.4* 67.8*  PLT 607*   < > 429* 522* 585* 609* 532*   < > = values in this interval not displayed.   Basic Metabolic Panel: Recent Labs  Lab 07/10/20 1759 07/10/20 2132 07/11/20 0226 07/13/20 0845  NA 132* 130* 131* 130*  K 3.3* 3.7 3.7 4.0  CL 90*  --  90* 91*  CO2 30  --  32 30  GLUCOSE 168*  --  146* 89  BUN <5*  --  5* 9  CREATININE 0.52  --  0.50 0.46  CALCIUM 8.6*  --  8.6* 8.5*   GFR: Estimated Creatinine Clearance: 64.8 mL/min (by C-G formula based on SCr of 0.46 mg/dL). Liver Function Tests: Recent Labs  Lab 07/10/20 1759 07/11/20 0226  AST 13* 11*  ALT 8 8  ALKPHOS 56 60  BILITOT 0.4 0.5  PROT 6.6 6.6  ALBUMIN 2.7* 2.7*   No results for input(s): LIPASE, AMYLASE in the last 168 hours. No results for input(s): AMMONIA in the last 168 hours. Coagulation Profile: Recent Labs  Lab 07/10/20 1816 07/11/20 0226 07/14/20 0735 07/15/20 1000  INR 1.1 1.1 1.1 1.3*   Cardiac Enzymes: No results for input(s): CKTOTAL, CKMB, CKMBINDEX, TROPONINI in the last 168 hours. BNP (last 3 results) No results for input(s): PROBNP in the last 8760 hours. HbA1C: No results for input(s): HGBA1C in the last 72 hours. CBG: Recent Labs  Lab 07/10/20 1835 07/11/20 0019 07/11/20 1129  GLUCAP 156* 169* 117*   Lipid Profile: No results for input(s): CHOL, HDL, LDLCALC, TRIG, CHOLHDL, LDLDIRECT in the last 72 hours. Thyroid Function Tests: Recent Labs    07/15/20 1000  TSH 28.727*  FREET4 0.99   Anemia Panel: No results for input(s): VITAMINB12, FOLATE, FERRITIN, TIBC, IRON, RETICCTPCT in the last 72 hours. Sepsis Labs: Recent Labs  Lab 07/10/20 1756  LATICACIDVEN  1.8    Recent Results (from the past 240 hour(s))  SARS Coronavirus 2 by RT PCR (hospital order, performed in [facility] hospital lab) Nasopharyngeal Nasopharyngeal Swab     Status: None   Collection Time: 07/10/20  5:35 PM   Specimen: Nasopharyngeal Swab  Result Value Ref Range Status   SARS Coronavirus 2 NEGATIVE NEGATIVE Final    Comment: (NOTE) SARS-CoV-2 target nucleic acids are NOT DETECTED.  The SARS-CoV-2 RNA is generally detectable in upper and lower respiratory specimens during the acute phase of infection. The lowest concentration of SARS-CoV-2 viral copies this assay can detect is 250 copies / mL. A negative result does not preclude SARS-CoV-2 infection and should not be used as the sole basis for treatment or other patient management decisions.  A negative result may occur with improper specimen collection / handling, submission of specimen other than nasopharyngeal swab, presence of viral mutation(s) within the areas targeted by this assay, and inadequate number of viral copies (<250 copies / mL). A negative result must be combined with clinical observations, patient history, and epidemiological information.  Fact Sheet for Patients:   StrictlyIdeas.no  Fact Sheet for Healthcare Providers: BankingDealers.co.za  This test is not yet approved or  cleared by the [place] FDA and has been authorized for detection and/or diagnosis of SARS-CoV-2 by FDA under an Emergency Use Authorization (EUA).  This EUA will remain in effect (meaning this test can be used) for the duration of the COVID-19 declaration under Section 564(b)(1) of the Act, 21 U.S.C. section 360bbb-3(b)(1), unless the authorization is terminated or revoked sooner.  Performed at [facility], [place] [address] [place], [place] [address]   Culture, blood (routine x 2)     Status: Abnormal   Collection Time: 07/10/20  5:36 PM   Specimen: BLOOD    Result Value Ref Range Status   Specimen Description BLOOD LEFT ANTECUBITAL  Final   Special Requests   Final    BOTTLES DRAWN AEROBIC AND ANAEROBIC Blood Culture adequate volume   Culture  Setup Time   Final    GRAM POSITIVE COCCI IN CLUSTERS IN BOTH  AEROBIC AND ANAEROBIC BOTTLES CRITICAL RESULT CALLED TO, READ BACK BY AND VERIFIED WITH: PHARMD K PIERCE [id number] FCP    Culture (A)  Final    STAPHYLOCOCCUS SIMULANS STAPHYLOCOCCUS CAPITIS STAPHYLOCOCCUS EPIDERMIDIS THE SIGNIFICANCE OF ISOLATING THIS ORGANISM FROM A SINGLE SET OF BLOOD CULTURES WHEN MULTIPLE SETS ARE DRAWN IS UNCERTAIN. PLEASE NOTIFY THE MICROBIOLOGY DEPARTMENT WITHIN ONE WEEK IF SPECIATION AND SENSITIVITIES ARE REQUIRED. Performed at [facility], [place] [address] [place], [place] [address]    Report Status 07/13/2020 FINAL  Final  Blood Culture ID Panel (Reflexed)     Status: Abnormal   Collection Time: 07/10/20  5:36 PM  Result Value Ref Range Status   Enterococcus faecalis NOT DETECTED NOT DETECTED Final   Enterococcus Faecium NOT DETECTED NOT DETECTED Final   Listeria monocytogenes NOT DETECTED NOT DETECTED Final   Staphylococcus species DETECTED (A) NOT DETECTED Final    Comment: CRITICAL RESULT CALLED TO, READ BACK BY AND VERIFIED WITH: PHARMD K PIERCE [id number] FCP    Staphylococcus aureus (BCID) NOT DETECTED NOT DETECTED Final   Staphylococcus epidermidis DETECTED (A) NOT DETECTED Final    Comment: CRITICAL RESULT CALLED TO, READ BACK BY AND VERIFIED WITH: PHARMD K PIERCE [id number] FCP    Staphylococcus lugdunensis NOT DETECTED NOT DETECTED Final   Streptococcus species NOT DETECTED NOT DETECTED Final   Streptococcus agalactiae NOT DETECTED NOT DETECTED Final   Streptococcus pneumoniae NOT DETECTED NOT DETECTED Final   Streptococcus pyogenes NOT DETECTED NOT DETECTED Final   A.calcoaceticus-baumannii NOT DETECTED NOT DETECTED Final   Bacteroides fragilis NOT DETECTED NOT DETECTED Final    Enterobacterales NOT DETECTED NOT DETECTED Final   Enterobacter cloacae complex NOT DETECTED NOT DETECTED Final   Escherichia coli NOT DETECTED NOT DETECTED Final   Klebsiella aerogenes NOT DETECTED NOT DETECTED Final   Klebsiella oxytoca NOT DETECTED NOT DETECTED Final   Klebsiella pneumoniae NOT DETECTED NOT DETECTED Final   Proteus species NOT DETECTED NOT DETECTED Final   Salmonella species NOT DETECTED NOT DETECTED Final   Serratia marcescens NOT DETECTED NOT DETECTED Final   Haemophilus influenzae NOT DETECTED NOT DETECTED Final   Neisseria meningitidis NOT DETECTED NOT DETECTED Final   Pseudomonas aeruginosa NOT DETECTED NOT DETECTED Final   Stenotrophomonas maltophilia NOT DETECTED NOT DETECTED Final   Candida albicans NOT DETECTED NOT DETECTED Final   Candida auris NOT DETECTED NOT DETECTED Final   Candida glabrata NOT DETECTED NOT DETECTED Final   Candida krusei NOT DETECTED NOT DETECTED Final   Candida parapsilosis NOT DETECTED NOT DETECTED Final   Candida tropicalis NOT DETECTED NOT DETECTED Final   Cryptococcus neoformans/gattii NOT DETECTED NOT DETECTED Final   Methicillin resistance mecA/C NOT DETECTED NOT DETECTED Final    Comment: Performed at [facility], [address] [place], [place] [address]  Culture, blood (routine x 2)     Status: None   Collection Time: 07/10/20  6:47 PM   Specimen: BLOOD RIGHT FOREARM  Result Value Ref Range Status   Specimen Description BLOOD RIGHT FOREARM  Final   Special Requests   Final    BOTTLES DRAWN AEROBIC AND ANAEROBIC Blood Culture adequate volume   Culture   Final    NO GROWTH 5 DAYS Performed at [facility], [place] [address] [place], [place] [address]    Report Status 07/15/2020 FINAL  Final  Body fluid culture (includes gram stain)     Status: None   Collection Time: 07/11/20  9:30  AM   Specimen: Pleural Fluid  Result Value Ref Range Status   Specimen Description PLEURAL FLUID  Final   Special Requests LEFT   Final   Gram Stain   Final    WBC PRESENT,BOTH PMN AND MONONUCLEAR NO ORGANISMS SEEN CYTOSPIN SMEAR    Culture   Final    NO GROWTH 3 DAYS Performed at [facility], [address] [place],  [address]    Report Status 07/14/2020 FINAL  Final     Radiology Studies: DG Chest Port 1 View  Result Date: 07/15/2020 CLINICAL DATA:  Left chest tube EXAM: PORTABLE CHEST 1 VIEW COMPARISON:  07/14/2020 FINDINGS: Slight retraction of the left small bore chest tube. No significant pneumothorax by plain radiography. Persistent left basilar collapse/consolidation. No enlarging effusion. Stable right lung aeration. Heart remains enlarged. Aorta atherosclerotic.  Degenerative changes noted of the spine. IMPRESSION: Slight retraction of the left chest tube. No current significant pneumothorax by plain radiography Persistent left lower lung collapse/consolidation. Aortic Atherosclerosis (ICD10-I70.0). Electronically Signed   By: Jerilynn Mages.  Shick M.D.   On: 07/15/2020 08:00   DG Chest Port 1 View  Result Date: 07/14/2020 CLINICAL DATA:  Chest tube present EXAM: PORTABLE CHEST 1 VIEW COMPARISON:  July 14, 2020 study obtained earlier in the day. FINDINGS: Chest tube on the left is unchanged in position. No evident pneumothorax. There is bibasilar atelectatic change, somewhat more on the left than on the right, stable. No new opacity evident. Heart is upper normal in size with pulmonary vascularity normal. No adenopathy. There is aortic atherosclerosis. No bone lesions. There is bilateral carotid artery calcification. IMPRESSION: Chest tube unchanged on the left without evident pneumothorax. Lower lung region atelectasis is stable. No new opacity. Stable cardiac silhouette. Aortic Atherosclerosis (ICD10-I70.0). There is bilateral carotid artery calcification. Electronically Signed   By: Lowella Grip III M.D.   On: 07/14/2020 13:25   DG Chest Port 1 View  Result Date: 07/14/2020 CLINICAL DATA:   Chest tube surveillance EXAM: PORTABLE CHEST 1 VIEW COMPARISON:  07/13/2020 FINDINGS: Left-sided chest tube remains in place. Stable cardiomegaly. Aortic atherosclerosis. Suspect small residual left-sided pleural effusion. Persistent bibasilar atelectasis. No pneumothorax is seen. IMPRESSION: 1. Left-sided chest tube remains in place. No pneumothorax. 2. Persistent bibasilar atelectasis and probable small residual left pleural effusion. Electronically Signed   By: Davina Poke D.O.   On: 07/14/2020 08:18    Scheduled Meds: . amLODipine  10 mg Oral QHS  . enoxaparin (LOVENOX) injection  40 mg Subcutaneous Daily  . ipratropium-albuterol  3 mL Nebulization BID  . levothyroxine  137 mcg Oral Q0600  . losartan  100 mg Oral QHS  . multivitamin with minerals  1 tablet Oral Daily  . sertraline  100 mg Oral QHS  . warfarin  7.5 mg Oral ONCE-1600  . Warfarin - Pharmacist Dosing Inpatient   Does not apply q1600   Continuous Infusions:   LOS: 5 days   Marylu Lund, MD [facility] Pager On Amion  If 7PM-7AM, please contact night-coverage 07/15/2020, 2:44 PM

## 2020-07-15 NOTE — Progress Notes (Signed)
Physical Therapy Treatment Patient Details Name: Jaime Kelley MRN: [id number] DOB: 11/27/1950 Today's Date: 07/15/2020    History of Present Illness Pt is a 69 y.o. female admitted 07/10/20 with SOB. Workup for anemia, large L-sided pleural effusion causing near collapse of L lung s/p chest tube placement. S/p thoracentesis on 9/19. PMH includes obesity, COPD (5L O2 baseline), DM, RA, HTN, PE (on chronic Coumadin).   PT Comments    Pt limited by c/o fatigue, also noted significant WOB with accessory muscle use at rest; SpO2 93% on 5L O2 [place]. ModA to sit EOB, pt declining standing or transfer to recliner secondary to fatigue despite max encouragement and education on importance of mobility. Continue to recommend SNF-level therapies to maximize functional mobility and independence. Called pt's daughter [name]) to provide an update.   Follow Up Recommendations  SNF;Supervision/Assistance - 24 hour     Equipment Recommendations   (defer to next venue)    Recommendations for Other Services       Precautions / Restrictions Precautions Precautions: Fall;Other (comment) Precaution Comments: Significant WOB at rest Restrictions Weight Bearing Restrictions: No    Mobility  Bed Mobility Overal bed mobility: Needs Assistance Bed Mobility: Supine to Sit;Sit to Supine     Supine to sit: Mod assist Sit to supine: Supervision   General bed mobility comments: Max cues to initiate mobility, modA to assist trunk elevation; return to supine and repositioned well with supervision for safety/line management  Transfers                 General transfer comment: Pt declined despite max encouragement and demonstration of Stedy use; limited by fatigue and SOB  Ambulation/Gait                 Stairs             Wheelchair Mobility    Modified Rankin (Stroke Patients Only)       Balance Overall balance assessment: Needs assistance Sitting-balance support: No  upper extremity supported;Feet unsupported Sitting balance-[name]: Fair                                      Cognition Arousal/Alertness: Awake/alert Behavior During Therapy: Flat affect Overall Cognitive Status: Impaired/Different from baseline                                 General Comments: Difficult to determine true cognitive impairment vs. fatigue vs. pt desire to participate. Inconsistently answering questions with one word reponses; clearly very SOB at rest likely limiting this as well      Exercises      General Comments General comments (skin integrity, edema, etc.): SpO2 93% on 5L O2 [place]; pt c/o dizziness seated EOB with BP 131/61. Spoke with daughter [name]) on phone with update on pt's status and recommendation for SNF      Pertinent Vitals/Pain Pain Assessment: Faces Faces Pain Scale: Hurts a little bit Pain Location: L side chest where chest tube removed Pain Descriptors / Indicators: Sore Pain Intervention(s): Monitored during session    Home Living                      Prior Function            PT Goals (current goals can now be found in the care  plan section) Acute Rehab PT Goals Patient Stated Goal: "I want to take a nap" Progress towards PT goals: Not progressing toward goals - comment (self-limiting, fatigue, SOB)    Frequency    Min 2X/week      PT Plan Current plan remains appropriate    Co-evaluation              AM-PAC PT "6 Clicks" Mobility   Outcome Measure  Help needed turning from your back to your side while in a flat bed without using bedrails?: A Lot Help needed moving from lying on your back to sitting on the side of a flat bed without using bedrails?: A Lot   Help needed standing up from a chair using your arms (e.g., wheelchair or bedside chair)?: Total Help needed to walk in hospital room?: Total Help needed climbing 3-5 steps with a railing? : Total 6 Click Score:  7    End of Session Equipment Utilized During Treatment: Oxygen Activity Tolerance: Patient limited by fatigue Patient left: in bed;with call bell/phone within reach;with bed alarm set Nurse Communication: Mobility status;Need for lift equipment PT Visit Diagnosis: Muscle weakness (generalized) (M62.81)     Time: 8127-5170 PT Time Calculation (min) (ACUTE ONLY): 17 min  Charges:  $Therapeutic Activity: 8-22 mins                    Mabeline Caras, PT, DPT Acute Rehabilitation Services  Pager [phone] Office [place] 07/15/2020, 4:58 PM

## 2020-07-15 NOTE — Progress Notes (Signed)
Chest tube has been removed as order. Patient is breathing well, O2 sat is 97% on 5 liters.

## 2020-07-15 NOTE — Care Management Important Message (Signed)
Important Message  Patient Details  Name: Jaime Kelley MRN: [id number] Date of Birth: [birth year]   Medicare Important Message Given:  Yes     Shelda Altes 07/15/2020, 10:21 AM

## 2020-07-16 LAB — PROTIME-INR
INR: 1.4 — ABNORMAL HIGH (ref 0.8–1.2)
Prothrombin Time: 16.7 seconds — ABNORMAL HIGH (ref 11.4–15.2)

## 2020-07-16 MED ORDER — WARFARIN SODIUM 10 MG PO TABS
10.0000 mg | ORAL_TABLET | Freq: Once | ORAL | Status: AC
  Administered 2020-07-16: 10 mg via ORAL
  Filled 2020-07-16: qty 1

## 2020-07-16 NOTE — Plan of Care (Signed)

## 2020-07-16 NOTE — Consult Note (Signed)
[facility]iat[facility] Consult   Reason for Consult: Competency in the setting of depression, history of depression, history of sometimes not taking her medication.  She decided a year and a half ago that she did not want to go to the doctor and so she quit getting out of bed and has not been out of the bed in over a year and a half. A roommate bathes her and changes her. Not great situation, but is she competent? Have restarted Zoloft here.  Referring Physician: Dr. [place] Pink Patient Identification: Jaime Kelley MRN:  [id number] Principal Diagnosis: Pleural effusion on left Diagnosis:  Principal Problem:   Pleural effusion on left Active Problems:   Hypothyroidism   COPD GOLD III   Rheumatoid arthritis ([place])   Essential hypertension   Type 2 diabetes mellitus (HCC)   Anticoagulated on Coumadin   Collapsed lung   Microcytic anemia   Chest pain   Thrombocytosis (HCC)   Generalized weakness   Stage II pressure ulcer of right buttock (HCC)   Obesity (BMI 30-39.9)   Mucus plugging of bronchi   Total Time spent with patient: 1 hour  Subjective:   Jaime Kelley is a 69 y.o. female patient admitted with shortness of breath on September 18.  She was found to be anemic with a hemoglobin of 6.8, pleural effusion resulting in thoracentesis 1300 cc removed.  Psychiatric consult was placed for competency in the setting of depression, and patient has been refusing to go to a skilled nursing facility.  Patient states she came to the hospital because she wanted pain medication and to be able to breath.  She reports she lives at home with a roommate, has a home health aide that comes over to visit.  Patient denies any previous psychiatric history, denies any previous inpatient admission, and denies any previous suicide attempts.  Per chart review patient has a history of depression, has been taking Zoloft as a home medication.  Given her current living situation, current medical  condition, and level of care needed to assist this writer was able to complete decision-making capacity assessment.   HPI:  69 year old female with past medical history of obesity, COPD with chronic respiratory failure on 5 L nasal cannula, diabetes mellitus, rheumatoid arthritis, hypertension and PE on chronic Coumadin who presented to the emergency room with shortness of breath on 9/18. Patient found to be anemic with a hemoglobin of 6.8, was at 70 3 years ago on her chart, subtherapeutic INR of 1.1 and a large left-sided pleural effusion causing near complete collapse of her left lung and requiring a nonrebreather to keep her oxygen saturations greater than 90%. Pulmonary consulted for thoracentesis and patient was admitted to the hospitalist service.  Patient continues to show signs of improvement. Follow-up chest x-ray notes reinflation of the lung. Had extensive conversation with patient's daughter, who lives in [place], on 9/21. Apparently, the patient lives in a townhome with a roommate. She has not walked in well over a year although it is from atrophy and deconditioning, not a medical condition. According to the patient, she has a roommate who cleans her when she has to use the bathroom, as she wears diapers. The daughter suspects patient is in a hoarding type situation. The patient affirms that the roommate could not lift her up and can barely care for herself. She does not want to go to a skilled nursing facility.  Past Psychiatric History: Depression taking Zoloft 100 mg p.o. nightly.  She does not have  any current outpatient provider.  She does not report any recent or previous inpatient admission.  She denies any previous suicide attempts.  She denies any current substance use and or legal charges.  Urine drug screen not obtained on admission.  Risk to Self:  Denies Risk to Others:  Denies Prior Inpatient Therapy:  Denies Prior Outpatient Therapy:  Denies  Past Medical History:   Past Medical History:  Diagnosis Date   Anxiety    COPD (chronic obstructive pulmonary disease) ([place])    Depression    Diabetes mellitus without complication (HCC)    Discoid lupus    sees Dr. Allyson Sabal   Dyspnea    GERD (gastroesophageal reflux disease)    History of colonic polyps    Hx pulmonary embolism    Hypertension    Hypothyroidism    Rheumatoid arthritis(714.0)    sees Dr. Dossie Der    Thyroid disease     Past Surgical History:  Procedure Laterality Date   ABDOMINAL HYSTERECTOMY     arthroscopy right knee     BREAST EXCISIONAL BIOPSY Right 2011   carpal tunnel release left wrist     CHEST TUBE INSERTION Left 07/12/2020   Procedure: INSERTION PLEURAL DRAINAGE CATHETER;  Surgeon: Garner Nash, DO;  Location: [facility];  Service: Pulmonary;  Laterality: Left;   fibrous dysplasia removed from left fibula     TONSILLECTOMY     VIDEO BRONCHOSCOPY N/A 07/12/2020   Procedure: VIDEO BRONCHOSCOPY WITHOUT FLUORO;  Surgeon: Garner Nash, DO;  Location: [facility];  Service: Pulmonary;  Laterality: N/A;   Family History:  Family History  Problem Relation Age of Onset   Arthritis Other    Coronary artery disease Other    Hypertension Other    Stroke Other    Cancer Other        lung   Lymphoma Son    Family Psychiatric  History: Denies Social History:  Social History   Substance and Sexual Activity  Alcohol Use No   Alcohol/week: 0.0 standard drinks     Social History   Substance and Sexual Activity  Drug Use No    Social History   Socioeconomic History   Marital status: Divorced    Spouse name: Not on file   Number of children: Not on file   Years of education: Not on file   Highest education level: Not on file  Occupational History   Not on file  Tobacco Use   Smoking status: Former Smoker    Packs/day: 1.00    Years: 40.00    Pack years: 40.00    Types: Cigarettes    Quit date: 01/18/2017    Years since  quitting: 3.4   Smokeless tobacco: Never Used  Substance and Sexual Activity   Alcohol use: No    Alcohol/week: 0.0 standard drinks   Drug use: No   Sexual activity: Not on file  Other Topics Concern   Not on file  Social History Narrative   Not on file   Social Determinants of Health   Financial Resource Strain:    Difficulty of Paying Living Expenses: Not on file  Food Insecurity:    Worried About Charity fundraiser in the Last Year: Not on file   [employer] of Food in the Last Year: Not on file  Transportation Needs:    Lack of Transportation (Medical): Not on file   Lack of Transportation (Non-Medical): Not on file  Physical Activity:    Days  of Exercise per Week: Not on file   Minutes of Exercise per Session: Not on file  Stress:    Feeling of Stress : Not on file  Social Connections:    Frequency of Communication with Friends and Family: Not on file   Frequency of Social Gatherings with Friends and Family: Not on file   Attends Religious Services: Not on file   Active Member of Clubs or Organizations: Not on file   Attends Archivist Meetings: Not on file   Marital Status: Not on file   Additional Social History:    Allergies:   Allergies  Allergen Reactions   Effexor [Venlafaxine Hydrochloride] Rash    Labs:  Results for orders placed or performed during the hospital encounter of 07/10/20 (from the past 48 hour(s))  TSH     Status: Abnormal   Collection Time: 07/15/20 10:00 AM  Result Value Ref Range   TSH 28.727 (H) 0.350 - 4.500 uIU/mL    Comment: Performed by a 3rd Generation assay with a functional sensitivity of <=0.01 uIU/mL. Performed at [facility], [place] [address], [place], [place] [address]   T4, free     Status: None   Collection Time: 07/15/20 10:00 AM  Result Value Ref Range   Free T4 0.99 0.61 - 1.12 ng/dL    Comment: (NOTE) Biotin ingestion may interfere with free T4 tests. If the results  are inconsistent with the TSH level, previous test results, or the clinical presentation, then consider biotin interference. If needed, order repeat testing after stopping biotin. Performed at [facility], [place] [address] [place], [place] [address]   CBC     Status: Abnormal   Collection Time: 07/15/20 10:00 AM  Result Value Ref Range   WBC 10.0 4.0 - 10.5 K/uL   RBC 4.16 3.87 - 5.11 MIL/uL   Hemoglobin 7.5 (L) 12.0 - 15.0 g/dL    Comment: Reticulocyte Hemoglobin testing may be clinically indicated, consider ordering this additional test [id number]    HCT 28.2 (L) 36 - 46 %   MCV 67.8 (L) 80.0 - 100.0 fL   MCH 18.0 (L) 26.0 - 34.0 pg   MCHC 26.6 (L) 30.0 - 36.0 g/dL   RDW 29.7 (H) 11.5 - 15.5 %   Platelets 532 (H) 150 - 400 K/uL   nRBC 0.0 0.0 - 0.2 %    Comment: Performed at [facility], [place] [address] [place], [place] [address]  Protime-INR     Status: Abnormal   Collection Time: 07/15/20 10:00 AM  Result Value Ref Range   Prothrombin Time 15.9 (H) 11.4 - 15.2 seconds   INR 1.3 (H) 0.8 - 1.2    Comment: (NOTE) INR goal varies based on device and disease states. Performed at [facility], [place] [address], [place], [place] [address]   Glucose, capillary     Status: Abnormal   Collection Time: 07/15/20  8:09 PM  Result Value Ref Range   Glucose-Capillary 132 (H) 70 - 99 mg/dL    Comment: Glucose reference range applies only to samples taken after fasting for at least 8 hours.   Comment 1 Notify RN    Comment 2 Glucose Stabilizer   Urinalysis, Routine w reflex microscopic     Status: Abnormal   Collection Time: 07/15/20 10:41 PM  Result Value Ref Range   Color, Urine YELLOW YELLOW   APPearance TURBID (A) CLEAR   Specific Gravity, Urine 1.020 1.005 - 1.030  pH 8.0 5.0 - 8.0   Glucose, UA NEGATIVE NEGATIVE mg/dL   Hgb urine dipstick MODERATE (A) NEGATIVE   Bilirubin Urine NEGATIVE NEGATIVE   Ketones, ur NEGATIVE NEGATIVE mg/dL   Protein, ur 100 (A)  NEGATIVE mg/dL   Nitrite NEGATIVE NEGATIVE   Leukocytes,Ua MODERATE (A) NEGATIVE    Comment: Performed at [facility] [address] [place], [place] [address]  Urinalysis, Microscopic (reflex)     Status: Abnormal   Collection Time: 07/15/20 10:41 PM  Result Value Ref Range   RBC / HPF 0-5 0 - 5 RBC/hpf   WBC, UA >50 0 - 5 WBC/hpf   Bacteria, UA MANY (A) NONE SEEN   Squamous Epithelial / LPF 0-5 0 - 5   Triple Phosphate Crystal PRESENT    Ca Oxalate Crys, UA PRESENT     Comment: Performed at [facility], [place] [address] [place], [place] [address]  Protime-INR     Status: Abnormal   Collection Time: 07/16/20  5:44 AM  Result Value Ref Range   Prothrombin Time 16.7 (H) 11.4 - 15.2 seconds   INR 1.4 (H) 0.8 - 1.2    Comment: (NOTE) INR goal varies based on device and disease states. Performed at [facility], [place] [address] [place], [place] [address]     Current Facility-Administered Medications  Medication Dose Route Frequency Provider Last Rate Last Admin   acetaminophen (TYLENOL) tablet 650 mg  650 mg Oral Q6H PRN Chotiner, Yevonne Aline, MD       Or   acetaminophen (TYLENOL) suppository 650 mg  650 mg Rectal Q6H PRN Chotiner, Yevonne Aline, MD       albuterol (VENTOLIN HFA) 108 (90 Base) MCG/ACT inhaler 2 puff  2 puff Inhalation Q4H PRN Chotiner, Yevonne Aline, MD   2 puff at 07/11/20 0250   amLODipine (NORVASC) tablet 10 mg  10 mg Oral QHS Donne Hazel, MD   10 mg at 07/15/20 2128   enoxaparin (LOVENOX) injection 40 mg  40 mg Subcutaneous Daily Annita Brod, MD   40 mg at 07/16/20 0912   HYDROcodone-acetaminophen (NORCO) 10-325 MG per tablet 0.5 tablet  0.5 tablet Oral QID PRN Chotiner, Yevonne Aline, MD   0.5 tablet at 07/16/20 1212   ipratropium-albuterol (DUONEB) 0.5-2.5 (3) MG/3ML nebulizer solution 3 mL  3 mL Nebulization BID Donne Hazel, MD   3 mL at 07/16/20 0823   levothyroxine (SYNTHROID) tablet 137 mcg  137 mcg Oral Q0600 Chotiner, Yevonne Aline, MD   137 mcg at 07/16/20 0414   losartan (COZAAR) tablet 100 mg  100 mg Oral QHS Chotiner, Yevonne Aline, MD   100 mg at 07/15/20 2128   morphine 2 MG/ML injection 2-4 mg  2-4 mg Intravenous Q4H PRN Merlene Laughter F, NP   2 mg at 07/11/20 1219   multivitamin with minerals tablet 1 tablet  1 tablet Oral Daily Annita Brod, MD   1 tablet at 07/16/20 0912   ondansetron (ZOFRAN) tablet 4 mg  4 mg Oral Q6H PRN Chotiner, Yevonne Aline, MD       Or   ondansetron (ZOFRAN) injection 4 mg  4 mg Intravenous Q6H PRN Chotiner, Yevonne Aline, MD       senna-docusate (Senokot-S) tablet 1 tablet  1 tablet Oral QHS PRN Chotiner, Yevonne Aline, MD       sertraline (ZOLOFT) tablet 100 mg  100 mg Oral QHS Chotiner, Yevonne Aline, MD   100 mg  at 07/15/20 2128   warfarin (COUMADIN) tablet 10 mg  10 mg Oral ONCE-1600 Donne Hazel, MD       Warfarin - Pharmacist Dosing Inpatient   Does not apply q1600 Karren Cobble [facility]   Given at 07/14/20 1639    Musculoskeletal: Strength & Muscle Tone: within normal limits Gait & Station: normal Patient leans: N/A  Psychiatric Specialty Exam: Physical Exam  Review of Systems  Blood pressure 121/60, pulse 74, temperature 98.4 F (36.9 C), temperature source Oral, resp. rate 17, height 5\' 2"  (1.575 m), weight 79.6 kg, SpO2 91 %.Body mass index is 32.1 kg/m.  General Appearance: Disheveled and Appears to be in respiratory distress while talking  Eye Contact:  Good  Speech:  Clear and Coherent and Slow  Volume:  Normal  Mood:  Anxious and Depressed  Affect:  Congruent and Depressed  Thought Process:  Coherent, Linear and Descriptions of Associations: Intact  Orientation:  Full (Time, Place, and Person)  Thought Content:  Logical  Suicidal Thoughts:  No  Homicidal Thoughts:  No  Memory:  Immediate;   Fair Recent;   Fair  Judgement:  Poor  Insight:  Shallow  Psychomotor Activity:  Psychomotor Retardation  Concentration:  Concentration: Fair and Attention Span:  Fair  Recall:  Poor  Fund of Knowledge:  Fair  Language:  Fair  Akathisia:  No  Handed:  Right  AIMS (if indicated):     Assets:  Communication Skills Desire for Improvement Financial Resources/Insurance Housing Social Support  ADL's:  Impaired  Cognition:  Impaired,  Mild  Sleep:       Will recommend continuing sertraline 100 mg for depression, anxiety and to assist with appetite.  If there continues to be concerns surrounding her appetite or oral intake consider adding mirtazapine 7.5 mg.  Patient with limited support system and instability.  When assessing for support system she says "me myself and I ", I got a home health nurse , and a roommate that can help me.  "  Patient was advised despite having current support system in place she has multiple pressure ulcers, deconditioning and multiple physical limitations due to being bedbound for over a year.  Patient does not have any insight into her previous or current living conditions.  Patient does not have the mental capacity to make medical decisions surrounding discharging home.  It is evident that this patient will need higher level of care even if for short time being in a skilled nursing facility.  She minimizes her depressive symptoms, and current medical conditions.  During the evaluation patient required assistance from multiple people to help pull her up in the bed as she did not have the strength to do so, and she continues to be unable to bear weight.   Mental Capacity Assessment: I have evaluated the following areas to assess the [place] mental capacity regarding medical decision-making ability which pertains to competency to accept or refuse medical treatment.   The specific treatment or service in question is: Discharge home or to a nursing facility  Communication: The patient was unable to clearly state preferred treatment options for her medical care at this time.  The patient was unable to decide or name the  outcomes for wanting to go to a skilled nursing facility versus home.  The statement surrounding her support system for me myself and I fluctuates with her moods and stubbornness.  Factors that could be compromising his communication process include hypothyroidism, anemia, COPD, and  hypoxemia.   Understanding: The patient was unable to recall information, link casual relationships, and process general probabilities regarding life situations and medical treatment scenarios.  She remains adamant that she wanted to go home.  She does not appear to be future oriented or open-minded to skilled nursing facility.  The patient did present with impairments in memory, and was noted to have some respiratory compromise as evident by her peers lip breathing, saturations in the 80s, and having labored breathing.    Appreciation: The patient was able to identify and describe her various illnesses and treatment options with potential outcomes. The patient did not present with concerns such as denial or delusional thought-process.   Rationalization: The patient was denied able to weigh risks and benefits and come to a conclusion congruent with patients perceived goals. Concerns regarding this category are: depression, acute/chronic encephalopathy, electrolyte imbalances,  In conclusion, the patient is-not experiencing an acute medical scenario: Hypoxemia or sepsis related to COPD which could compromise her mental capacity.  Conclusion: At this time, there is sufficient evidence to warrant removal of the patient's rights for medical decision-making regarding her discharging home. She can clearly determine mental capacity for decision-making due to presence of underlying COPD and physical limitations.  At this time we can determine that patient does not have functional mental capacity for medical decision making in to refuse going to a skilled nursing facility.    A long discussion was had with patient regarding her medical  complaints and her prognosis if she chooses not to engage in skilled nursing or rehab and treatment for her depression.   She remains oppositional about going to a nursing facility for rehabilitation.  Patient has an appreciation of the fact that she may be harmed if she refuses to go to the rehab facility.  Patient is not able to reason with writer and explain why she has made this decision to want to go home besides "I will recuperate faster at home.  "   For these reasons writer feels that patient does not have capacity at this point in time to refuse skilled nursing facility.   Treatment Plan Summary: Continue home medications.  Psychiatry to sign off at this time.  Writer did discuss with attending physician Dr. [place] Pink regarding capacity versus competency.  This nurse practitioner was able to complete capacity evaluation for discharge home versus skilled nursing facility.  If competency is of question please refer to social work for further evaluation and beginning of legal proceedings.  Disposition: No evidence of imminent risk to self or others at present.   Patient does not meet criteria for psychiatric inpatient admission.  Suella Broad, FNP 07/16/2020 4:10 PM

## 2020-07-16 NOTE — Progress Notes (Signed)
[facility] for Coumadin Indication: h/o multiple dVTs and PEs  Allergies  Allergen Reactions  . Effexor [Venlafaxine Hydrochloride] Rash    Patient Measurements: Height: 5\' 2"  (157.5 cm) Weight: 79.6 kg (175 lb 7.8 oz) IBW/kg (Calculated) : 50.1  Vital Signs: Temp: 97.9 F (36.6 C) (09/24 0738) Temp Source: Oral (09/24 0738) BP: 121/60 (09/24 0738) Pulse Rate: 74 (09/24 0400)  Labs: Recent Labs    07/14/20 0735 07/15/20 1000 07/16/20 0544  HGB 7.5* 7.5*  --   HCT 28.3* 28.2*  --   PLT 609* 532*  --   LABPROT 13.7 15.9* 16.7*  INR 1.1 1.3* 1.4*    Estimated Creatinine Clearance: 64.9 mL/min (by C-G formula based on SCr of 0.46 mg/dL).   Medical History: Past Medical History:  Diagnosis Date  . Anxiety   . COPD (chronic obstructive pulmonary disease) ([place])   . Depression   . Diabetes mellitus without complication ([place])   . Discoid lupus    sees Dr. Allyson Sabal  . Dyspnea   . GERD (gastroesophageal reflux disease)   . History of colonic polyps   . Hx pulmonary embolism   . Hypertension   . Hypothyroidism   . Rheumatoid arthritis(714.0)    sees Dr. Dossie Der   . Thyroid disease     Assessment: 46 yof w/ h/o DVT and PE (2008,2012,2014) on warfarin. LD PTA 9/17. On concurrent enoxaparin while INR low.   PTA regimen 7.5 mg daily except 3.75 mg on Wed (INR 1.1 on admit)  INR today is subtherapeutic at 1.3>1.4, on enoxaparin 40 mg daily while INR low, has received 3 doses of warfarin. Hgb 7.5, plt 532 on last check - ordered q72 hr. No s/sx of bleeding. Oral intake documented at 80-100%.   Goal of Therapy:  INR 2-3 Monitor platelets by anticoagulation protocol: Yes   Plan:  Lovenox 40mg /d Coumadin 10 mg po x 1 tonight Daily INR  Antonietta Jewel, PharmD, BCCCP Clinical Pharmacist  Phone: [phone] 07/16/2020 10:31 AM  Please check AMION for all [facility] phone numbers After 10:00 PM, call [place] [phone]

## 2020-07-16 NOTE — Progress Notes (Signed)
PROGRESS NOTE    Jaime Kelley  WSF:[id number] DOB: 1951-09-26 DOA: 07/10/2020 PCP: Laurey Morale, MD    Brief Narrative:  69 year old female with past medical history of obesity, COPD with chronic respiratory failure on 5 L nasal cannula, diabetes mellitus, rheumatoid arthritis, hypertension and PE on chronic Coumadin who presented to the emergency room with shortness of breath on 9/18.  Patient found to be anemic with a hemoglobin of 6.8, was at 13 3 years ago on her chart, subtherapeutic INR of 1.1 and a large left-sided pleural effusion causing near complete collapse of her left lung and requiring a nonrebreather to keep her oxygen saturations greater than 90%.  Pulmonary consulted for thoracentesis and patient was admitted to the hospitalist service.  Patient underwent thoracentesis on 9/19 with approximately 1300 cc of fluid removed, .  Breathing has since improved post procedure x-rays noted persistent complete opacification of left hemithorax.  Pulmonary took patient for bronchoscopy on 9/20 finding solidified mucous plug within the left mainstem which was able to be suctioned clear.  Following, pleural catheter placed.   Patient continues to show signs of improvement.  Follow-up chest x-ray notes reinflation of the lung.  Had extensive conversation with patient's daughter, who lives in [place], on 9/21.  Apparently, the patient lives in a townhome with a roommate.  She has not walked in well over a year although it is from atrophy and deconditioning, not a medical condition.  According to the patient, she has a roommate who cleans her when she has to use the bathroom, as she wears diapers.  The daughter suspects patient is in a hoarding type situation.  The patient affirms that the roommate could not lift her up and can barely care for herself.  She does not want to go to a skilled nursing facility.  Assessment & Plan:   Principal Problem:   Pleural effusion on left Active Problems:    Hypothyroidism   COPD GOLD III   Rheumatoid arthritis ([place])   Essential hypertension   Type 2 diabetes mellitus (HCC)   Anticoagulated on Coumadin   Collapsed lung   Microcytic anemia   Chest pain   Thrombocytosis (HCC)   Generalized weakness   Stage II pressure ulcer of right buttock (HCC)   Obesity (BMI 30-39.9)   Mucus plugging of bronchi  Principal Problem: COPD Gold with chronic respiratory failure on 5 L nasal cannula with large pleural effusion on left causing collapsed lung.  Status post thoracentesis followed by bronchoscopy and resolution of mucous plug and pleural catheter insertion.  Lung reexpanded.  Follow-up x-ray noted resolution of pleural effusion.    Noted blood cultures grew out 1 out of 4 for staph epi, likely contamination.  Chest tube now d/c'd by pulmonary on 9/23. Pt to f/u with Pulmonary as outpatient   Active Problems:    Type 2 diabetes mellitus ([place]): Glucose trends currently stable    Anticoagulated on Coumadin: INR subtherapeutic on admission.  Was on heparin which was held for procedures.  Coumadin restarted 9/21. Concern made for noncompliance with meds prior to admit noted  Hypothyroidism: Continue Synthroid.  TSH elevated at 28.727. T4 levels within normal limits. See above. Concerns were raised for noncompliance with meds. Will cont thyroid replacement, would recommend repeat TSH in 4-6 weeks  Stage 2 decubitus ulcer: from staying in bed.  Non-mobile.  Provide wound care as needed  microcytic anemia: No evidence of bleeding.  Hemoglobin on admission at 6.5 with an MCV of 63.  Status post transfusion and hemoglobin at 7.7 on 9/20 and has remained stable.  Hgb remains stable. Remains hemodynamically stable  Obesity: Patient meets criteria with BMI greater than 30  Essential hypertension: Continue Norvasc  Depression: Continue Zoloft.  Consulted psychiatry to see given her social situation and to establish patient's decision making capacity.  Appreciate input. Patient is deemed to have no capacity at this point in time to refuse skilled nursing facility. SW following. Will plan on SNF placement  DVT prophylaxis: coumadin Code Status: Full Family Communication: Pt in room, family not at bedside  Status is: Inpatient  Remains inpatient appropriate because:Unsafe d/c plan   Dispo: The patient is from: Home              Anticipated d/c is to: SNF              Anticipated d/c date is: 2 days              Patient currently is medically stable to d/c. Just pending disposition       Consultants:   Pulmonary  Psychiatry  Procedures:   Status post thoracentesis on 9/19 with 1300 cc fluid removed  Status post bronchoscopy and pigtail catheter placement on 9/20  Chest tube removed 9/23  Antimicrobials: Anti-infectives (From admission, onward)   None      Subjective: Stated wish to go home  Objective: Vitals:   07/16/20 0400 07/16/20 0738 07/16/20 0823 07/16/20 1200  BP: 105/61 121/60    Pulse: 74     Resp:  18  17  Temp: 99.4 F (37.4 C) 97.9 F (36.6 C)  98.4 F (36.9 C)  TempSrc: Oral Oral  Oral  SpO2: 98%  95% 91%  Weight:      Height:        Intake/Output Summary (Last 24 hours) at 07/16/2020 1746 Last data filed at 07/16/2020 1314 Gross per 24 hour  Intake 680 ml  Output 100 ml  Net 580 ml   Filed Weights   07/13/20 0017 07/15/20 0400 07/16/20 0350  Weight: 79.4 kg 79.4 kg 79.6 kg    Examination: General exam: Awake, laying in bed, in nad Respiratory system: Normal respiratory effort, no wheezing Cardiovascular system: regular rate, s1, s2 Gastrointestinal system: Soft, nondistended, positive BS Central nervous system: CN2-12 grossly intact, strength intact Extremities: Perfused, no clubbing Skin: Normal skin turgor, no notable skin lesions seen Psychiatry: Mood normal // no visual hallucinations   Data Reviewed: I have personally reviewed following labs and imaging  studies  CBC: Recent Labs  Lab 07/10/20 1759 07/10/20 2132 07/11/20 0226 07/12/20 0611 07/13/20 0845 07/14/20 0735 07/15/20 1000  WBC 6.8   < > 10.4 10.9* 12.5* 11.3* 10.0  NEUTROABS 4.1  --   --   --   --   --   --   HGB 6.5*   < > 7.6* 7.7* 7.8* 7.5* 7.5*  HCT 26.8*   < > 28.7* 28.7* 28.6* 28.3* 28.2*  MCV 63.4*   < > 67.1* 68.2* 67.1* 68.4* 67.8*  PLT 607*   < > 429* 522* 585* 609* 532*   < > = values in this interval not displayed.   Basic Metabolic Panel: Recent Labs  Lab 07/10/20 1759 07/10/20 2132 07/11/20 0226 07/13/20 0845  NA 132* 130* 131* 130*  K 3.3* 3.7 3.7 4.0  CL 90*  --  90* 91*  CO2 30  --  32 30  GLUCOSE 168*  --  146* 89  BUN <5*  --  5* 9  CREATININE 0.52  --  0.50 0.46  CALCIUM 8.6*  --  8.6* 8.5*   GFR: Estimated Creatinine Clearance: 64.9 mL/min (by C-G formula based on SCr of 0.46 mg/dL). Liver Function Tests: Recent Labs  Lab 07/10/20 1759 07/11/20 0226  AST 13* 11*  ALT 8 8  ALKPHOS 56 60  BILITOT 0.4 0.5  PROT 6.6 6.6  ALBUMIN 2.7* 2.7*   No results for input(s): LIPASE, AMYLASE in the last 168 hours. No results for input(s): AMMONIA in the last 168 hours. Coagulation Profile: Recent Labs  Lab 07/10/20 1816 07/11/20 0226 07/14/20 0735 07/15/20 1000 07/16/20 0544  INR 1.1 1.1 1.1 1.3* 1.4*   Cardiac Enzymes: No results for input(s): CKTOTAL, CKMB, CKMBINDEX, TROPONINI in the last 168 hours. BNP (last 3 results) No results for input(s): PROBNP in the last 8760 hours. HbA1C: No results for input(s): HGBA1C in the last 72 hours. CBG: Recent Labs  Lab 07/10/20 1835 07/11/20 0019 07/11/20 1129 07/15/20 2009  GLUCAP 156* 169* 117* 132*   Lipid Profile: No results for input(s): CHOL, HDL, LDLCALC, TRIG, CHOLHDL, LDLDIRECT in the last 72 hours. Thyroid Function Tests: Recent Labs    07/15/20 1000  TSH 28.727*  FREET4 0.99   Anemia Panel: No results for input(s): VITAMINB12, FOLATE, FERRITIN, TIBC, IRON,  RETICCTPCT in the last 72 hours. Sepsis Labs: Recent Labs  Lab 07/10/20 1756  LATICACIDVEN 1.8    Recent Results (from the past 240 hour(s))  SARS Coronavirus 2 by RT PCR (hospital order, performed in [facility] hospital lab) Nasopharyngeal Nasopharyngeal Swab     Status: None   Collection Time: 07/10/20  5:35 PM   Specimen: Nasopharyngeal Swab  Result Value Ref Range Status   SARS Coronavirus 2 NEGATIVE NEGATIVE Final    Comment: (NOTE) SARS-CoV-2 target nucleic acids are NOT DETECTED.  The SARS-CoV-2 RNA is generally detectable in upper and lower respiratory specimens during the acute phase of infection. The lowest concentration of SARS-CoV-2 viral copies this assay can detect is 250 copies / mL. A negative result does not preclude SARS-CoV-2 infection and should not be used as the sole basis for treatment or other patient management decisions.  A negative result may occur with improper specimen collection / handling, submission of specimen other than nasopharyngeal swab, presence of viral mutation(s) within the areas targeted by this assay, and inadequate number of viral copies (<250 copies / mL). A negative result must be combined with clinical observations, patient history, and epidemiological information.  Fact Sheet for Patients:   StrictlyIdeas.no  Fact Sheet for Healthcare Providers: BankingDealers.co.za  This test is not yet approved or  cleared by the [place] FDA and has been authorized for detection and/or diagnosis of SARS-CoV-2 by FDA under an Emergency Use Authorization (EUA).  This EUA will remain in effect (meaning this test can be used) for the duration of the COVID-19 declaration under Section 564(b)(1) of the Act, 21 U.S.C. section 360bbb-3(b)(1), unless the authorization is terminated or revoked sooner.  Performed at [facility], [place] [address], [place], [place] [address]   Culture, blood  (routine x 2)     Status: Abnormal   Collection Time: 07/10/20  5:36 PM   Specimen: BLOOD  Result Value Ref Range Status   Specimen Description BLOOD LEFT ANTECUBITAL  Final   Special Requests   Final    BOTTLES DRAWN AEROBIC AND ANAEROBIC Blood Culture adequate volume   Culture  Setup Time  Final    GRAM POSITIVE COCCI IN CLUSTERS IN BOTH AEROBIC AND ANAEROBIC BOTTLES CRITICAL RESULT CALLED TO, READ BACK BY AND VERIFIED WITH: PHARMD K PIERCE [id number] FCP    Culture (A)  Final    STAPHYLOCOCCUS SIMULANS STAPHYLOCOCCUS CAPITIS STAPHYLOCOCCUS EPIDERMIDIS THE SIGNIFICANCE OF ISOLATING THIS ORGANISM FROM A SINGLE SET OF BLOOD CULTURES WHEN MULTIPLE SETS ARE DRAWN IS UNCERTAIN. PLEASE NOTIFY THE MICROBIOLOGY DEPARTMENT WITHIN ONE WEEK IF SPECIATION AND SENSITIVITIES ARE REQUIRED. Performed at [facility], [place] [address] [place], [place] [address]    Report Status 07/13/2020 FINAL  Final  Blood Culture ID Panel (Reflexed)     Status: Abnormal   Collection Time: 07/10/20  5:36 PM  Result Value Ref Range Status   Enterococcus faecalis NOT DETECTED NOT DETECTED Final   Enterococcus Faecium NOT DETECTED NOT DETECTED Final   Listeria monocytogenes NOT DETECTED NOT DETECTED Final   Staphylococcus species DETECTED (A) NOT DETECTED Final    Comment: CRITICAL RESULT CALLED TO, READ BACK BY AND VERIFIED WITH: PHARMD K PIERCE [id number] FCP    Staphylococcus aureus (BCID) NOT DETECTED NOT DETECTED Final   Staphylococcus epidermidis DETECTED (A) NOT DETECTED Final    Comment: CRITICAL RESULT CALLED TO, READ BACK BY AND VERIFIED WITH: PHARMD K PIERCE [id number] FCP    Staphylococcus lugdunensis NOT DETECTED NOT DETECTED Final   Streptococcus species NOT DETECTED NOT DETECTED Final   Streptococcus agalactiae NOT DETECTED NOT DETECTED Final   Streptococcus pneumoniae NOT DETECTED NOT DETECTED Final   Streptococcus pyogenes NOT DETECTED NOT DETECTED Final   A.calcoaceticus-baumannii  NOT DETECTED NOT DETECTED Final   Bacteroides fragilis NOT DETECTED NOT DETECTED Final   Enterobacterales NOT DETECTED NOT DETECTED Final   Enterobacter cloacae complex NOT DETECTED NOT DETECTED Final   Escherichia coli NOT DETECTED NOT DETECTED Final   Klebsiella aerogenes NOT DETECTED NOT DETECTED Final   Klebsiella oxytoca NOT DETECTED NOT DETECTED Final   Klebsiella pneumoniae NOT DETECTED NOT DETECTED Final   Proteus species NOT DETECTED NOT DETECTED Final   Salmonella species NOT DETECTED NOT DETECTED Final   Serratia marcescens NOT DETECTED NOT DETECTED Final   Haemophilus influenzae NOT DETECTED NOT DETECTED Final   Neisseria meningitidis NOT DETECTED NOT DETECTED Final   Pseudomonas aeruginosa NOT DETECTED NOT DETECTED Final   Stenotrophomonas maltophilia NOT DETECTED NOT DETECTED Final   Candida albicans NOT DETECTED NOT DETECTED Final   Candida auris NOT DETECTED NOT DETECTED Final   Candida glabrata NOT DETECTED NOT DETECTED Final   Candida krusei NOT DETECTED NOT DETECTED Final   Candida parapsilosis NOT DETECTED NOT DETECTED Final   Candida tropicalis NOT DETECTED NOT DETECTED Final   Cryptococcus neoformans/gattii NOT DETECTED NOT DETECTED Final   Methicillin resistance mecA/C NOT DETECTED NOT DETECTED Final    Comment: Performed at [facility], [address], [place], [place] [address]  Culture, blood (routine x 2)     Status: None   Collection Time: 07/10/20  6:47 PM   Specimen: BLOOD RIGHT FOREARM  Result Value Ref Range Status   Specimen Description BLOOD RIGHT FOREARM  Final   Special Requests   Final    BOTTLES DRAWN AEROBIC AND ANAEROBIC Blood Culture adequate volume   Culture   Final    NO GROWTH 5 DAYS Performed at [facility], [place] [address], [place], [place] [address]    Report Status 07/15/2020 FINAL  Final  Body fluid culture (includes gram stain)  Status: None   Collection Time: 07/11/20  9:30 AM   Specimen: Pleural Fluid  Result  Value Ref Range Status   Specimen Description PLEURAL FLUID  Final   Special Requests LEFT  Final   Gram Stain   Final    WBC PRESENT,BOTH PMN AND MONONUCLEAR NO ORGANISMS SEEN CYTOSPIN SMEAR    Culture   Final    NO GROWTH 3 DAYS Performed at [facility], [address] [place], [place] [address]    Report Status 07/14/2020 FINAL  Final     Radiology Studies: DG Chest Port 1 View  Result Date: 07/15/2020 CLINICAL DATA:  Left chest tube EXAM: PORTABLE CHEST 1 VIEW COMPARISON:  07/14/2020 FINDINGS: Slight retraction of the left small bore chest tube. No significant pneumothorax by plain radiography. Persistent left basilar collapse/consolidation. No enlarging effusion. Stable right lung aeration. Heart remains enlarged. Aorta atherosclerotic.  Degenerative changes noted of the spine. IMPRESSION: Slight retraction of the left chest tube. No current significant pneumothorax by plain radiography Persistent left lower lung collapse/consolidation. Aortic Atherosclerosis (ICD10-I70.0). Electronically Signed   By: Jerilynn Mages.  Shick M.D.   On: 07/15/2020 08:00    Scheduled Meds: . amLODipine  10 mg Oral QHS  . enoxaparin (LOVENOX) injection  40 mg Subcutaneous Daily  . ipratropium-albuterol  3 mL Nebulization BID  . levothyroxine  137 mcg Oral Q0600  . losartan  100 mg Oral QHS  . multivitamin with minerals  1 tablet Oral Daily  . sertraline  100 mg Oral QHS  . warfarin  10 mg Oral ONCE-1600  . Warfarin - Pharmacist Dosing Inpatient   Does not apply q1600   Continuous Infusions:   LOS: 6 days   Marylu Lund, MD [facility] Pager On Amion  If 7PM-7AM, please contact night-coverage 07/16/2020, 5:46 PM

## 2020-07-17 LAB — PROTIME-INR
INR: 1.4 — ABNORMAL HIGH (ref 0.8–1.2)
Prothrombin Time: 16.3 seconds — ABNORMAL HIGH (ref 11.4–15.2)

## 2020-07-17 MED ORDER — WARFARIN SODIUM 7.5 MG PO TABS
7.5000 mg | ORAL_TABLET | Freq: Once | ORAL | Status: AC
  Administered 2020-07-17: 7.5 mg via ORAL
  Filled 2020-07-17: qty 1

## 2020-07-17 NOTE — Progress Notes (Signed)
[place] for Coumadin Indication: h/o multiple dVTs and PEs  Allergies  Allergen Reactions  . Effexor [Venlafaxine Hydrochloride] Rash    Patient Measurements: Height: 5\' 2"  (157.5 cm) Weight: 76.7 kg (169 lb) IBW/kg (Calculated) : 50.1  Vital Signs: Temp: 98.2 F (36.8 C) (09/25 0829) Temp Source: Oral (09/25 0829) BP: 120/97 (09/25 0829) Pulse Rate: 77 (09/25 0852)  Labs: Recent Labs    07/15/20 1000 07/16/20 0544 07/17/20 0546  HGB 7.5*  --   --   HCT 28.2*  --   --   PLT 532*  --   --   LABPROT 15.9* 16.7* 16.3*  INR 1.3* 1.4* 1.4*    Estimated Creatinine Clearance: 63.6 mL/min (by C-G formula based on SCr of 0.46 mg/dL).   Medical History: Past Medical History:  Diagnosis Date  . Anxiety   . COPD (chronic obstructive pulmonary disease) ([place])   . Depression   . Diabetes mellitus without complication ([place])   . Discoid lupus    sees Dr. Allyson Sabal  . Dyspnea   . GERD (gastroesophageal reflux disease)   . History of colonic polyps   . Hx pulmonary embolism   . Hypertension   . Hypothyroidism   . Rheumatoid arthritis(714.0)    sees Dr. Dossie Der   . Thyroid disease     Assessment: 61 yof w/ h/o DVT and PE (2008,2012,2014) on warfarin. LD PTA 9/17. On concurrent enoxaparin while INR low.   PTA regimen 7.5 mg daily except 3.75 mg on Wed (INR 1.1 on admit).  INR continues to be subtherapeutic at 1.4, on enoxaparin 40 mg daily while INR low, has received 4 doses of warfarin. Hgb 7.5, plt 532 on last check - ordered q72 hr. No s/sx of bleeding. Oral intake documented at 80-100%.   Goal of Therapy:  INR 2-3 Monitor platelets by anticoagulation protocol: Yes   Plan:  Lovenox 40mg /d Coumadin 7.5 mg po x 1 tonight Daily INR  Romilda Garret, PharmD PGY1 Acute Care Pharmacy Resident Phone: [phone] 07/17/2020 11:11 AM  Please check [email or web address] for unit specific pharmacy phone numbers.

## 2020-07-17 NOTE — Progress Notes (Signed)
PROGRESS NOTE    Jaime Kelley  ERD:[id number] DOB: February 09, 1951 DOA: 07/10/2020 PCP: Laurey Morale, MD    Brief Narrative:  69 year old female with past medical history of obesity, COPD with chronic respiratory failure on 5 L nasal cannula, diabetes mellitus, rheumatoid arthritis, hypertension and PE on chronic Coumadin who presented to the emergency room with shortness of breath on 9/18.  Patient found to be anemic with a hemoglobin of 6.8, was at 13 3 years ago on her chart, subtherapeutic INR of 1.1 and a large left-sided pleural effusion causing near complete collapse of her left lung and requiring a nonrebreather to keep her oxygen saturations greater than 90%.  Pulmonary consulted for thoracentesis and patient was admitted to the hospitalist service.  Patient underwent thoracentesis on 9/19 with approximately 1300 cc of fluid removed, .  Breathing has since improved post procedure x-rays noted persistent complete opacification of left hemithorax.  Pulmonary took patient for bronchoscopy on 9/20 finding solidified mucous plug within the left mainstem which was able to be suctioned clear.  Following, pleural catheter placed.   Patient continues to show signs of improvement.  Follow-up chest x-ray notes reinflation of the lung.  Had extensive conversation with patient's daughter, who lives in [place], on 9/21.  Apparently, the patient lives in a townhome with a roommate.  She has not walked in well over a year although it is from atrophy and deconditioning, not a medical condition.  According to the patient, she has a roommate who cleans her when she has to use the bathroom, as she wears diapers.  The daughter suspects patient is in a hoarding type situation.  The patient affirms that the roommate could not lift her up and can barely care for herself.  She does not want to go to a skilled nursing facility.  Assessment & Plan:   Principal Problem:   Pleural effusion on left Active Problems:    Hypothyroidism   COPD GOLD III   Rheumatoid arthritis ([place])   Essential hypertension   Type 2 diabetes mellitus (HCC)   Anticoagulated on Coumadin   Collapsed lung   Microcytic anemia   Chest pain   Thrombocytosis (HCC)   Generalized weakness   Stage II pressure ulcer of right buttock (HCC)   Obesity (BMI 30-39.9)   Mucus plugging of bronchi  Principal Problem: COPD Gold with chronic respiratory failure on 5 L nasal cannula with large pleural effusion on left causing collapsed lung.  Status post thoracentesis followed by bronchoscopy and resolution of mucous plug and pleural catheter insertion.  Lung reexpanded.  Follow-up x-ray noted resolution of pleural effusion.    Noted blood cultures grew out 1 out of 4 for staph epi, likely contamination.  Chest tube now d/c'd by pulmonary on 9/23. Would have pt f/u with Pulmonary as outpatient   Active Problems:    Type 2 diabetes mellitus ([place]): Glucose trends currently stable. Glycemic trends reviewed    Anticoagulated on Coumadin: INR subtherapeutic on admission.  Was on heparin which was held for procedures.  Coumadin restarted 9/21. Concern made for noncompliance with meds prior to admit noted.  INR today 1.4  Hypothyroidism: Continue Synthroid.  TSH elevated at 28.727. T4 levels within normal limits. See above. Concerns were raised for noncompliance with meds. Will cont thyroid replacement, would recommend repeat TSH in 4-6 weeks  Stage 2 decubitus ulcer: from staying in bed.  Non-mobile.  Provide wound care as needed  microcytic anemia: No evidence of bleeding.  Hemoglobin on  admission at 6.5 with an MCV of 63.  Status post transfusion and hemoglobin at 7.7 on 9/20 and has remained stable.  Hgb had remained stable recently.  No evidence of acute blood loss  Obesity: Patient meets criteria with BMI greater than 30  Essential hypertension: Continue Norvasc  Depression: Continue Zoloft.  Consulted psychiatry to see given her  social situation and to establish patient's decision making capacity. Appreciate input. Patient is deemed to have no capacity at this point in time to refuse skilled nursing facility.  Social work currently following.  Pending skilled nursing placement  DVT prophylaxis: coumadin Code Status: Full Family Communication: Pt in room, family not at bedside  Status is: Inpatient  Remains inpatient appropriate because:Unsafe d/c plan   Dispo: The patient is from: Home              Anticipated d/c is to: SNF              Anticipated d/c date is: 2 days              Patient currently is medically stable to d/c. Just pending disposition       Consultants:   Pulmonary  Psychiatry  Procedures:   Status post thoracentesis on 9/19 with 1300 cc fluid removed  Status post bronchoscopy and pigtail catheter placement on 9/20  Chest tube removed 9/23  Antimicrobials: Anti-infectives (From admission, onward)   None      Subjective: Reiterated wish for going home.  Objective: Vitals:   07/17/20 0437 07/17/20 0829 07/17/20 0852 07/17/20 1156  BP: (!) 118/56 (!) 120/97  126/69  Pulse: 72  77 73  Resp: 17  17 18   Temp: 98.4 F (36.9 C) 98.2 F (36.8 C)  98.4 F (36.9 C)  TempSrc: Oral Oral  Oral  SpO2: 98%  96% 97%  Weight: 76.7 kg     Height:        Intake/Output Summary (Last 24 hours) at 07/17/2020 1254 Last data filed at 07/17/2020 4401 Gross per 24 hour  Intake 680 ml  Output 500 ml  Net 180 ml   Filed Weights   07/15/20 0400 07/16/20 0350 07/17/20 0437  Weight: 79.4 kg 79.6 kg 76.7 kg    Examination: General exam: Conversant, in no acute distress Respiratory system: normal chest rise, clear, no audible wheezing Cardiovascular system: regular rhythm, s1-s2 Gastrointestinal system: Nondistended, nontender, pos BS Central nervous system: No seizures, no tremors Extremities: No cyanosis, no joint deformities Skin: No rashes, no pallor Psychiatry: Affect normal  // no auditory hallucinations   Data Reviewed: I have personally reviewed following labs and imaging studies  CBC: Recent Labs  Lab 07/10/20 1759 07/10/20 2132 07/11/20 0226 07/12/20 0611 07/13/20 0845 07/14/20 0735 07/15/20 1000  WBC 6.8   < > 10.4 10.9* 12.5* 11.3* 10.0  NEUTROABS 4.1  --   --   --   --   --   --   HGB 6.5*   < > 7.6* 7.7* 7.8* 7.5* 7.5*  HCT 26.8*   < > 28.7* 28.7* 28.6* 28.3* 28.2*  MCV 63.4*   < > 67.1* 68.2* 67.1* 68.4* 67.8*  PLT 607*   < > 429* 522* 585* 609* 532*   < > = values in this interval not displayed.   Basic Metabolic Panel: Recent Labs  Lab 07/10/20 1759 07/10/20 2132 07/11/20 0226 07/13/20 0845  NA 132* 130* 131* 130*  K 3.3* 3.7 3.7 4.0  CL 90*  --  90*  91*  CO2 30  --  32 30  GLUCOSE 168*  --  146* 89  BUN <5*  --  5* 9  CREATININE 0.52  --  0.50 0.46  CALCIUM 8.6*  --  8.6* 8.5*   GFR: Estimated Creatinine Clearance: 63.6 mL/min (by C-G formula based on SCr of 0.46 mg/dL). Liver Function Tests: Recent Labs  Lab 07/10/20 1759 07/11/20 0226  AST 13* 11*  ALT 8 8  ALKPHOS 56 60  BILITOT 0.4 0.5  PROT 6.6 6.6  ALBUMIN 2.7* 2.7*   No results for input(s): LIPASE, AMYLASE in the last 168 hours. No results for input(s): AMMONIA in the last 168 hours. Coagulation Profile: Recent Labs  Lab 07/11/20 0226 07/14/20 0735 07/15/20 1000 07/16/20 0544 07/17/20 0546  INR 1.1 1.1 1.3* 1.4* 1.4*   Cardiac Enzymes: No results for input(s): CKTOTAL, CKMB, CKMBINDEX, TROPONINI in the last 168 hours. BNP (last 3 results) No results for input(s): PROBNP in the last 8760 hours. HbA1C: No results for input(s): HGBA1C in the last 72 hours. CBG: Recent Labs  Lab 07/10/20 1835 07/11/20 0019 07/11/20 1129 07/15/20 2009  GLUCAP 156* 169* 117* 132*   Lipid Profile: No results for input(s): CHOL, HDL, LDLCALC, TRIG, CHOLHDL, LDLDIRECT in the last 72 hours. Thyroid Function Tests: Recent Labs    07/15/20 1000  TSH 28.727*    FREET4 0.99   Anemia Panel: No results for input(s): VITAMINB12, FOLATE, FERRITIN, TIBC, IRON, RETICCTPCT in the last 72 hours. Sepsis Labs: Recent Labs  Lab 07/10/20 1756  LATICACIDVEN 1.8    Recent Results (from the past 240 hour(s))  SARS Coronavirus 2 by RT PCR (hospital order, performed in [facility] hospital lab) Nasopharyngeal Nasopharyngeal Swab     Status: None   Collection Time: 07/10/20  5:35 PM   Specimen: Nasopharyngeal Swab  Result Value Ref Range Status   SARS Coronavirus 2 NEGATIVE NEGATIVE Final    Comment: (NOTE) SARS-CoV-2 target nucleic acids are NOT DETECTED.  The SARS-CoV-2 RNA is generally detectable in upper and lower respiratory specimens during the acute phase of infection. The lowest concentration of SARS-CoV-2 viral copies this assay can detect is 250 copies / mL. A negative result does not preclude SARS-CoV-2 infection and should not be used as the sole basis for treatment or other patient management decisions.  A negative result may occur with improper specimen collection / handling, submission of specimen other than nasopharyngeal swab, presence of viral mutation(s) within the areas targeted by this assay, and inadequate number of viral copies (<250 copies / mL). A negative result must be combined with clinical observations, patient history, and epidemiological information.  Fact Sheet for Patients:   StrictlyIdeas.no  Fact Sheet for Healthcare Providers: BankingDealers.co.za  This test is not yet approved or  cleared by the [place] FDA and has been authorized for detection and/or diagnosis of SARS-CoV-2 by FDA under an Emergency Use Authorization (EUA).  This EUA will remain in effect (meaning this test can be used) for the duration of the COVID-19 declaration under Section 564(b)(1) of the Act, 21 U.S.C. section 360bbb-3(b)(1), unless the authorization is terminated or revoked  sooner.  Performed at [facility], [place] [address] [place], [place] [address]   Culture, blood (routine x 2)     Status: Abnormal   Collection Time: 07/10/20  5:36 PM   Specimen: BLOOD  Result Value Ref Range Status   Specimen Description BLOOD LEFT ANTECUBITAL  Final   Special Requests  Final    BOTTLES DRAWN AEROBIC AND ANAEROBIC Blood Culture adequate volume   Culture  Setup Time   Final    GRAM POSITIVE COCCI IN CLUSTERS IN BOTH AEROBIC AND ANAEROBIC BOTTLES CRITICAL RESULT CALLED TO, READ BACK BY AND VERIFIED WITH: PHARMD K PIERCE [id number] FCP    Culture (A)  Final    STAPHYLOCOCCUS SIMULANS STAPHYLOCOCCUS CAPITIS STAPHYLOCOCCUS EPIDERMIDIS THE SIGNIFICANCE OF ISOLATING THIS ORGANISM FROM A SINGLE SET OF BLOOD CULTURES WHEN MULTIPLE SETS ARE DRAWN IS UNCERTAIN. PLEASE NOTIFY THE MICROBIOLOGY DEPARTMENT WITHIN ONE WEEK IF SPECIATION AND SENSITIVITIES ARE REQUIRED. Performed at [facility], [place] [address], [place], [place] [address]    Report Status 07/13/2020 FINAL  Final  Blood Culture ID Panel (Reflexed)     Status: Abnormal   Collection Time: 07/10/20  5:36 PM  Result Value Ref Range Status   Enterococcus faecalis NOT DETECTED NOT DETECTED Final   Enterococcus Faecium NOT DETECTED NOT DETECTED Final   Listeria monocytogenes NOT DETECTED NOT DETECTED Final   Staphylococcus species DETECTED (A) NOT DETECTED Final    Comment: CRITICAL RESULT CALLED TO, READ BACK BY AND VERIFIED WITH: PHARMD K PIERCE [id number] FCP    Staphylococcus aureus (BCID) NOT DETECTED NOT DETECTED Final   Staphylococcus epidermidis DETECTED (A) NOT DETECTED Final    Comment: CRITICAL RESULT CALLED TO, READ BACK BY AND VERIFIED WITH: PHARMD K PIERCE [id number] FCP    Staphylococcus lugdunensis NOT DETECTED NOT DETECTED Final   Streptococcus species NOT DETECTED NOT DETECTED Final   Streptococcus agalactiae NOT DETECTED NOT DETECTED Final   Streptococcus pneumoniae NOT DETECTED  NOT DETECTED Final   Streptococcus pyogenes NOT DETECTED NOT DETECTED Final   A.calcoaceticus-baumannii NOT DETECTED NOT DETECTED Final   Bacteroides fragilis NOT DETECTED NOT DETECTED Final   Enterobacterales NOT DETECTED NOT DETECTED Final   Enterobacter cloacae complex NOT DETECTED NOT DETECTED Final   Escherichia coli NOT DETECTED NOT DETECTED Final   Klebsiella aerogenes NOT DETECTED NOT DETECTED Final   Klebsiella oxytoca NOT DETECTED NOT DETECTED Final   Klebsiella pneumoniae NOT DETECTED NOT DETECTED Final   Proteus species NOT DETECTED NOT DETECTED Final   Salmonella species NOT DETECTED NOT DETECTED Final   Serratia marcescens NOT DETECTED NOT DETECTED Final   Haemophilus influenzae NOT DETECTED NOT DETECTED Final   Neisseria meningitidis NOT DETECTED NOT DETECTED Final   Pseudomonas aeruginosa NOT DETECTED NOT DETECTED Final   Stenotrophomonas maltophilia NOT DETECTED NOT DETECTED Final   Candida albicans NOT DETECTED NOT DETECTED Final   Candida auris NOT DETECTED NOT DETECTED Final   Candida glabrata NOT DETECTED NOT DETECTED Final   Candida krusei NOT DETECTED NOT DETECTED Final   Candida parapsilosis NOT DETECTED NOT DETECTED Final   Candida tropicalis NOT DETECTED NOT DETECTED Final   Cryptococcus neoformans/gattii NOT DETECTED NOT DETECTED Final   Methicillin resistance mecA/C NOT DETECTED NOT DETECTED Final    Comment: Performed at [facility], [address] [place], [place] [address]  Culture, blood (routine x 2)     Status: None   Collection Time: 07/10/20  6:47 PM   Specimen: BLOOD RIGHT FOREARM  Result Value Ref Range Status   Specimen Description BLOOD RIGHT FOREARM  Final   Special Requests   Final    BOTTLES DRAWN AEROBIC AND ANAEROBIC Blood Culture adequate volume   Culture   Final    NO GROWTH 5 DAYS Performed at [facility], [place] [address],  [place], [place] [address]    Report Status 07/15/2020 FINAL  Final  Body fluid culture  (includes gram stain)     Status: None   Collection Time: 07/11/20  9:30 AM   Specimen: Pleural Fluid  Result Value Ref Range Status   Specimen Description PLEURAL FLUID  Final   Special Requests LEFT  Final   Gram Stain   Final    WBC PRESENT,BOTH PMN AND MONONUCLEAR NO ORGANISMS SEEN CYTOSPIN SMEAR    Culture   Final    NO GROWTH 3 DAYS Performed at [facility], [address] [place], [place] [address]    Report Status 07/14/2020 FINAL  Final  Urine culture     Status: Abnormal (Preliminary result)   Collection Time: 07/15/20 10:41 PM   Specimen: Urine, Random  Result Value Ref Range Status   Specimen Description URINE, RANDOM  Final   Special Requests   Final    NONE Performed at [facility], [place] [address] [place], [place] [address]    Culture >=100,000 COLONIES/mL GRAM NEGATIVE RODS (A)  Final   Report Status PENDING  Incomplete     Radiology Studies: No results found.  Scheduled Meds: . amLODipine  10 mg Oral QHS  . enoxaparin (LOVENOX) injection  40 mg Subcutaneous Daily  . ipratropium-albuterol  3 mL Nebulization BID  . levothyroxine  137 mcg Oral Q0600  . losartan  100 mg Oral QHS  . multivitamin with minerals  1 tablet Oral Daily  . sertraline  100 mg Oral QHS  . warfarin  7.5 mg Oral ONCE-1600  . Warfarin - Pharmacist Dosing Inpatient   Does not apply q1600   Continuous Infusions:   LOS: 7 days   Marylu Lund, MD [facility] Pager On Amion  If 7PM-7AM, please contact night-coverage 07/17/2020, 12:54 PM

## 2020-07-17 NOTE — Progress Notes (Signed)
Occupational Therapy Progress Note  Patient requires max encouragement throughout session to participate in OT. Allow for increased time for initiation of mobility, patient does not initiate pushing through elbow as instructed to sit upright requiring mod A to complete. Patient reporting buttock hurts but cannot elaborate, OT attempt to educate that immobility can cause bed sores/skin break down and movement is important to reposition, build strength however patient states "it's not that." Patient lays herself down 3 times during session requiring max encouragement to wash face and minimally attempts to comb her hair. Will continue to follow for patient progress towards acute OT goals    07/17/20 1300  OT Visit Information  Last OT Received On 07/17/20  Assistance Needed +2  History of Present Illness Pt is a 69 y.o. female admitted 07/10/20 with SOB. Workup for anemia, large L-sided pleural effusion causing near collapse of L lung s/p chest tube placement. S/p thoracentesis on 9/19. PMH includes obesity, COPD (5L O2 baseline), DM, RA, HTN, PE (on chronic Coumadin).  Precautions  Precautions Fall;Other (comment)  Precaution Comments Significant WOB at rest  Pain Assessment  Pain Assessment Faces  Faces Pain Scale 6  Pain Location buttock  Pain Descriptors / Indicators Sore  Pain Intervention(s) Monitored during session  Cognition  Arousal/Alertness Awake/alert  Behavior During Therapy Flat affect  Overall Cognitive Status No family/caregiver present to determine baseline cognitive functioning  General Comments patient inconsistently answers questions even when allowed increased response time. demonstrates poor insight to deficits and requires MAX encouragement to participate   ADL  Overall ADL's  Needs assistance/impaired  Grooming Wash/dry face;Brushing hair;Moderate assistance;Sitting  Grooming Details (indicate cue type and reason) patient washes face seated EOB, when presented with comb  for hair (significant amount of knots) patient combs front of hair once each side then states "done" and refuses to continued despite cues for encouragement  Bed Mobility  Overal bed mobility Needs Assistance  Bed Mobility Supine to Sit;Sit to Supine  Supine to sit Mod assist  Sit to supine Supervision  General bed mobility comments max cues to initiate bed mobility, patient able to mobilize LEs to EOB, allowed for increased time and cues to push through elbow to sit trunk upright however patient does not initiate therefore mod A to complete. patient initiates laying herself back onto bed multiple times without physical assistance   Balance  Overall balance assessment Needs assistance  Sitting-balance support No upper extremity supported;Feet unsupported  Sitting balance-[name] Fair  Sitting balance - Comments patient lays herself back onto bed 3 times requiring max encouragement to participate in seated ADLs  Restrictions  Weight Bearing Restrictions No  Transfers  General transfer comment patient refuses  General Comments  General comments (skin integrity, edema, etc.) pt sat 94% on 4L O2  OT - End of Session  Equipment Utilized During Treatment Oxygen  Activity Tolerance Patient limited by fatigue;Other (comment) (treatment limited secondary to patient motivation)  Patient left in bed;with call bell/phone within reach;with bed alarm set  Nurse Communication Mobility status  OT Assessment/Plan  OT Plan Discharge plan remains appropriate  OT Visit Diagnosis Unsteadiness on feet (R26.81);Muscle weakness (generalized) (M62.81);Other symptoms and signs involving cognitive function;Dizziness and giddiness (R42);Pain  Pain - part of body  (buttock)  OT Frequency (ACUTE ONLY) Min 2X/week  Follow Up Recommendations SNF  OT Equipment 3 in 1 bedside commode;Wheelchair (measurements OT);Wheelchair cushion (measurements OT);Hospital bed  AM-PAC OT "6 Clicks" Daily Activity Outcome Measure  (Version 2)  Help from another person  eating meals? 3  Help from another person taking care of personal grooming? 2  Help from another person toileting, which includes using toliet, bedpan, or urinal? 1  Help from another person bathing (including washing, rinsing, drying)? 2  Help from another person to put on and taking off regular upper body clothing? 2  Help from another person to put on and taking off regular lower body clothing? 1  6 Click Score 11  OT Goal Progression  Progress towards OT goals Progressing toward goals  Acute Rehab OT Goals  Patient Stated Goal "I want to take a nap"  OT Goal Formulation With patient  Time For Goal Achievement 08/02/20  Potential to Achieve Goals Fair  ADL Goals  Pt Will Perform Grooming with min guard assist;sitting  Pt Will Perform Upper Body Bathing with min assist;sitting  Pt Will Perform Upper Body Dressing with min assist;sitting  Pt Will Transfer to Toilet with mod assist;squat pivot transfer  OT Time Calculation  OT Start Time (ACUTE ONLY) 1236  OT Stop Time (ACUTE ONLY) 1300  OT Time Calculation (min) 24 min  OT General Charges  $OT Visit 1 Visit  OT Treatments  $Self Care/Home Management  23-37 mins   Delbert Phenix OT OT pager: [phone]

## 2020-07-18 DIAGNOSIS — Z7901 Long term (current) use of anticoagulants: Secondary | ICD-10-CM

## 2020-07-18 LAB — URINE CULTURE: Culture: >=100000 — AB

## 2020-07-18 LAB — PROTIME-INR
INR: 1.7 — ABNORMAL HIGH (ref 0.8–1.2)
Prothrombin Time: 19.7 seconds — ABNORMAL HIGH (ref 11.4–15.2)

## 2020-07-18 MED ORDER — WARFARIN SODIUM 7.5 MG PO TABS
7.5000 mg | ORAL_TABLET | Freq: Once | ORAL | Status: AC
  Administered 2020-07-18: 7.5 mg via ORAL
  Filled 2020-07-18: qty 1

## 2020-07-18 NOTE — Plan of Care (Signed)
  Problem: Clinical Measurements: Goal: Respiratory complications will improve Outcome: Progressing 02 sats Greater than 92% on 4L of oxygen   Problem: Activity: Goal: Risk for activity intolerance will decrease Outcome: Not Progressing  Patient impeding care Refusing to get out of bed d/t Severe R/A Pain

## 2020-07-18 NOTE — Progress Notes (Signed)
Writer has strongly encouraged pt to get out out bed and patient  refuses states that she has Severe ROA and Panics to get out of bed. Writer and NT performed peri care Repositioned in bed and put bed in chair position with [place] in place.

## 2020-07-18 NOTE — Progress Notes (Signed)
[place] for Coumadin Indication: h/o multiple dVTs and PEs  Allergies  Allergen Reactions  . Effexor [Venlafaxine Hydrochloride] Rash    Patient Measurements: Height: 5\' 2"  (157.5 cm) Weight: 78 kg (172 lb) IBW/kg (Calculated) : 50.1  Vital Signs: Temp: 99.1 F (37.3 C) (09/26 0840) Temp Source: Oral (09/26 0840) BP: 132/59 (09/26 0840) Pulse Rate: 81 (09/26 0845)  Labs: Recent Labs    07/15/20 1000 07/15/20 1000 07/16/20 0544 07/17/20 0546 07/18/20 0543  HGB 7.5*  --   --   --   --   HCT 28.2*  --   --   --   --   PLT 532*  --   --   --   --   LABPROT 15.9*   < > 16.7* 16.3* 19.7*  INR 1.3*   < > 1.4* 1.4* 1.7*   < > = values in this interval not displayed.    Estimated Creatinine Clearance: 64.2 mL/min (by C-G formula based on SCr of 0.46 mg/dL).   Medical History: Past Medical History:  Diagnosis Date  . Anxiety   . COPD (chronic obstructive pulmonary disease) ([place])   . Depression   . Diabetes mellitus without complication ([place])   . Discoid lupus    sees Dr. Allyson Sabal  . Dyspnea   . GERD (gastroesophageal reflux disease)   . History of colonic polyps   . Hx pulmonary embolism   . Hypertension   . Hypothyroidism   . Rheumatoid arthritis(714.0)    sees Dr. Dossie Der   . Thyroid disease     Assessment: 59 yof w/ h/o DVT and PE (2008,2012,2014) on warfarin. LD PTA 9/17. On concurrent enoxaparin while INR low.   PTA regimen 7.5 mg daily except 3.75 mg on Wed (INR 1.1 on admit).  INR is subtherapeutic at 1.7 but appropriately increasing, on enoxaparin 40 mg daily while INR low, has received 5 doses of warfarin. May resume home dosing based on INR trend. Hgb 7.5, plt 532 on last check - ordered q72 hr. No s/sx of bleeding. Oral intake down yesterday.   Goal of Therapy:  INR 2-3 Monitor platelets by anticoagulation protocol: Yes   Plan:  Lovenox 40mg /d Coumadin 7.5 mg po x 1 tonight Daily INR  Romilda Garret,  PharmD PGY1 Acute Care Pharmacy Resident Phone: [phone] 07/18/2020 9:16 AM  Please check [email or web address] for unit specific pharmacy phone numbers.

## 2020-07-18 NOTE — Progress Notes (Signed)
PROGRESS NOTE    Jaime Kelley  XAJ:[id number] DOB: 1951-06-01 DOA: 07/10/2020 PCP: Laurey Morale, MD    Brief Narrative:  69 year old female with past medical history of obesity, COPD with chronic respiratory failure on 5 L nasal cannula, diabetes mellitus, rheumatoid arthritis, hypertension and PE on chronic Coumadin who presented to the emergency room with shortness of breath on 9/18.  Patient found to be anemic with a hemoglobin of 6.8, was at 13 3 years ago on her chart, subtherapeutic INR of 1.1 and a large left-sided pleural effusion causing near complete collapse of her left lung and requiring a nonrebreather to keep her oxygen saturations greater than 90%.  Pulmonary consulted for thoracentesis and patient was admitted to the hospitalist service.  Patient underwent thoracentesis on 9/19 with approximately 1300 cc of fluid removed, .  Breathing has since improved post procedure x-rays noted persistent complete opacification of left hemithorax.  Pulmonary took patient for bronchoscopy on 9/20 finding solidified mucous plug within the left mainstem which was able to be suctioned clear.  Following, pleural catheter placed.   Patient continues to show signs of improvement.  Follow-up chest x-ray notes reinflation of the lung.  Had extensive conversation with patient's daughter, who lives in [place], on 9/21.  Apparently, the patient lives in a townhome with a roommate.  She has not walked in well over a year although it is from atrophy and deconditioning, not a medical condition.  According to the patient, she has a roommate who cleans her when she has to use the bathroom, as she wears diapers.  The daughter suspects patient is in a hoarding type situation.  The patient affirms that the roommate could not lift her up and can barely care for herself.  She does not want to go to a skilled nursing facility.  Assessment & Plan:   Principal Problem:   Pleural effusion on left Active Problems:    Hypothyroidism   COPD GOLD III   Rheumatoid arthritis ([place])   Essential hypertension   Type 2 diabetes mellitus (HCC)   Anticoagulated on Coumadin   Collapsed lung   Microcytic anemia   Chest pain   Thrombocytosis (HCC)   Generalized weakness   Stage II pressure ulcer of right buttock (HCC)   Obesity (BMI 30-39.9)   Mucus plugging of bronchi  Principal Problem: COPD Gold with chronic respiratory failure on 5 L nasal cannula with large pleural effusion on left causing collapsed lung.  Status post thoracentesis followed by bronchoscopy and resolution of mucous plug and pleural catheter insertion.  Lung reexpanded.  Follow-up x-ray noted resolution of pleural effusion.    Noted blood cultures grew out 1 out of 4 for staph epi, likely contamination.  Chest tube has been d/c'd by pulmonary on 9/23. Would have pt f/u with Pulmonary as outpatient   Active Problems:    Type 2 diabetes mellitus ([place]): Glucose trends currently stable. Glycemic trends reviewed    Anticoagulated on Coumadin: INR subtherapeutic on admission.  Was on heparin which was held for procedures.  Coumadin restarted 9/21. Concern made for noncompliance with meds prior to admit noted.  INR today 1.7  Hypothyroidism: Continue Synthroid.  TSH elevated at 28.727. T4 levels within normal limits. See above. Concerns were raised for noncompliance with meds. Will cont thyroid replacement, would recommend repeat TSH in 4-6 weeks  Stage 2 decubitus ulcer: from staying in bed.  Non-mobile.  Provide wound care as needed  microcytic anemia: No evidence of bleeding.  Hemoglobin  on admission at 6.5 with an MCV of 63.  Status post transfusion and hemoglobin at 7.7 on 9/20 and has remained stable.  Hgb had remained stable recently.  No evidence of acute blood loss  Obesity: Patient meets criteria with BMI greater than 30  Essential hypertension: Continue Norvasc  Depression: Continue Zoloft.  Consulted psychiatry to see given her  social situation and to establish patient's decision making capacity. Appreciate input. Patient is deemed to have no capacity at this point in time to refuse skilled nursing facility.  Social work currently following.  Pending skilled nursing placement at this time  DVT prophylaxis: coumadin Code Status: Full Family Communication: Pt in room, family not at bedside, had earlier discussed case with pt's daughter over phone  Status is: Inpatient  Remains inpatient appropriate because:Unsafe d/c plan   Dispo: The patient is from: Home              Anticipated d/c is to: SNF              Anticipated d/c date is: 2 days              Patient currently is medically stable to d/c. Just pending disposition       Consultants:   Pulmonary  Psychiatry  Procedures:   Status post thoracentesis on 9/19 with 1300 cc fluid removed  Status post bronchoscopy and pigtail catheter placement on 9/20  Chest tube removed 9/23  Antimicrobials: Anti-infectives (From admission, onward)   None      Subjective: Not wanting to sit in chair this AM  Objective: Vitals:   07/18/20 0531 07/18/20 0840 07/18/20 0845 07/18/20 1247  BP: 120/66 (!) 132/59  (!) 106/54  Pulse: 76 81 81 77  Resp: 18 18 18 18   Temp: 98.9 F (37.2 C) 99.1 F (37.3 C)    TempSrc: Oral Oral    SpO2: 90% 97% 97% 99%  Weight: 78 kg     Height:        Intake/Output Summary (Last 24 hours) at 07/18/2020 1301 Last data filed at 07/18/2020 0533 Gross per 24 hour  Intake 120 ml  Output 450 ml  Net -330 ml   Filed Weights   07/16/20 0350 07/17/20 0437 07/18/20 0531  Weight: 79.6 kg 76.7 kg 78 kg    Examination: General exam: Awake, laying in bed, in nad Respiratory system: Normal respiratory effort, no wheezing Cardiovascular system: regular rate, s1, s2 Gastrointestinal system: Soft, nondistended, positive BS Central nervous system: CN2-12 grossly intact, strength intact Extremities: Perfused, no clubbing Skin:  Normal skin turgor, no notable skin lesions seen Psychiatry: Mood normal // no visual hallucinations   Data Reviewed: I have personally reviewed following labs and imaging studies  CBC: Recent Labs  Lab 07/12/20 0611 07/13/20 0845 07/14/20 0735 07/15/20 1000  WBC 10.9* 12.5* 11.3* 10.0  HGB 7.7* 7.8* 7.5* 7.5*  HCT 28.7* 28.6* 28.3* 28.2*  MCV 68.2* 67.1* 68.4* 67.8*  PLT 522* 585* 609* 893*   Basic Metabolic Panel: Recent Labs  Lab 07/13/20 0845  NA 130*  K 4.0  CL 91*  CO2 30  GLUCOSE 89  BUN 9  CREATININE 0.46  CALCIUM 8.5*   GFR: Estimated Creatinine Clearance: 64.2 mL/min (by C-G formula based on SCr of 0.46 mg/dL). Liver Function Tests: No results for input(s): AST, ALT, ALKPHOS, BILITOT, PROT, ALBUMIN in the last 168 hours. No results for input(s): LIPASE, AMYLASE in the last 168 hours. No results for input(s): AMMONIA in the  last 168 hours. Coagulation Profile: Recent Labs  Lab 07/14/20 0735 07/15/20 1000 07/16/20 0544 07/17/20 0546 07/18/20 0543  INR 1.1 1.3* 1.4* 1.4* 1.7*   Cardiac Enzymes: No results for input(s): CKTOTAL, CKMB, CKMBINDEX, TROPONINI in the last 168 hours. BNP (last 3 results) No results for input(s): PROBNP in the last 8760 hours. HbA1C: No results for input(s): HGBA1C in the last 72 hours. CBG: Recent Labs  Lab 07/15/20 2009  GLUCAP 132*   Lipid Profile: No results for input(s): CHOL, HDL, LDLCALC, TRIG, CHOLHDL, LDLDIRECT in the last 72 hours. Thyroid Function Tests: No results for input(s): TSH, T4TOTAL, FREET4, T3FREE, THYROIDAB in the last 72 hours. Anemia Panel: No results for input(s): VITAMINB12, FOLATE, FERRITIN, TIBC, IRON, RETICCTPCT in the last 72 hours. Sepsis Labs: No results for input(s): PROCALCITON, LATICACIDVEN in the last 168 hours.  Recent Results (from the past 240 hour(s))  SARS Coronavirus 2 by RT PCR (hospital order, performed in [facility] hospital lab) Nasopharyngeal Nasopharyngeal Swab      Status: None   Collection Time: 07/10/20  5:35 PM   Specimen: Nasopharyngeal Swab  Result Value Ref Range Status   SARS Coronavirus 2 NEGATIVE NEGATIVE Final    Comment: (NOTE) SARS-CoV-2 target nucleic acids are NOT DETECTED.  The SARS-CoV-2 RNA is generally detectable in upper and lower respiratory specimens during the acute phase of infection. The lowest concentration of SARS-CoV-2 viral copies this assay can detect is 250 copies / mL. A negative result does not preclude SARS-CoV-2 infection and should not be used as the sole basis for treatment or other patient management decisions.  A negative result may occur with improper specimen collection / handling, submission of specimen other than nasopharyngeal swab, presence of viral mutation(s) within the areas targeted by this assay, and inadequate number of viral copies (<250 copies / mL). A negative result must be combined with clinical observations, patient history, and epidemiological information.  Fact Sheet for Patients:   StrictlyIdeas.no  Fact Sheet for Healthcare Providers: BankingDealers.co.za  This test is not yet approved or  cleared by the [place] FDA and has been authorized for detection and/or diagnosis of SARS-CoV-2 by FDA under an Emergency Use Authorization (EUA).  This EUA will remain in effect (meaning this test can be used) for the duration of the COVID-19 declaration under Section 564(b)(1) of the Act, 21 U.S.C. section 360bbb-3(b)(1), unless the authorization is terminated or revoked sooner.  Performed at [facility], [place] [address], [place], [place] [address]   Culture, blood (routine x 2)     Status: Abnormal   Collection Time: 07/10/20  5:36 PM   Specimen: BLOOD  Result Value Ref Range Status   Specimen Description BLOOD LEFT ANTECUBITAL  Final   Special Requests   Final    BOTTLES DRAWN AEROBIC AND ANAEROBIC Blood Culture adequate volume     Culture  Setup Time   Final    GRAM POSITIVE COCCI IN CLUSTERS IN BOTH AEROBIC AND ANAEROBIC BOTTLES CRITICAL RESULT CALLED TO, READ BACK BY AND VERIFIED WITH: PHARMD K PIERCE [id number] FCP    Culture (A)  Final    STAPHYLOCOCCUS SIMULANS STAPHYLOCOCCUS CAPITIS STAPHYLOCOCCUS EPIDERMIDIS THE SIGNIFICANCE OF ISOLATING THIS ORGANISM FROM A SINGLE SET OF BLOOD CULTURES WHEN MULTIPLE SETS ARE DRAWN IS UNCERTAIN. PLEASE NOTIFY THE MICROBIOLOGY DEPARTMENT WITHIN ONE WEEK IF SPECIATION AND SENSITIVITIES ARE REQUIRED. Performed at [facility], [place] [address] [place], [place] [address]    Report Status 07/13/2020 FINAL  Final  Blood Culture ID Panel (Reflexed)     Status: Abnormal   Collection Time: 07/10/20  5:36 PM  Result Value Ref Range Status   Enterococcus faecalis NOT DETECTED NOT DETECTED Final   Enterococcus Faecium NOT DETECTED NOT DETECTED Final   Listeria monocytogenes NOT DETECTED NOT DETECTED Final   Staphylococcus species DETECTED (A) NOT DETECTED Final    Comment: CRITICAL RESULT CALLED TO, READ BACK BY AND VERIFIED WITH: PHARMD K PIERCE [id number] FCP    Staphylococcus aureus (BCID) NOT DETECTED NOT DETECTED Final   Staphylococcus epidermidis DETECTED (A) NOT DETECTED Final    Comment: CRITICAL RESULT CALLED TO, READ BACK BY AND VERIFIED WITH: PHARMD K PIERCE [id number] FCP    Staphylococcus lugdunensis NOT DETECTED NOT DETECTED Final   Streptococcus species NOT DETECTED NOT DETECTED Final   Streptococcus agalactiae NOT DETECTED NOT DETECTED Final   Streptococcus pneumoniae NOT DETECTED NOT DETECTED Final   Streptococcus pyogenes NOT DETECTED NOT DETECTED Final   A.calcoaceticus-baumannii NOT DETECTED NOT DETECTED Final   Bacteroides fragilis NOT DETECTED NOT DETECTED Final   Enterobacterales NOT DETECTED NOT DETECTED Final   Enterobacter cloacae complex NOT DETECTED NOT DETECTED Final   Escherichia coli NOT DETECTED NOT DETECTED Final   Klebsiella  aerogenes NOT DETECTED NOT DETECTED Final   Klebsiella oxytoca NOT DETECTED NOT DETECTED Final   Klebsiella pneumoniae NOT DETECTED NOT DETECTED Final   Proteus species NOT DETECTED NOT DETECTED Final   Salmonella species NOT DETECTED NOT DETECTED Final   Serratia marcescens NOT DETECTED NOT DETECTED Final   Haemophilus influenzae NOT DETECTED NOT DETECTED Final   Neisseria meningitidis NOT DETECTED NOT DETECTED Final   Pseudomonas aeruginosa NOT DETECTED NOT DETECTED Final   Stenotrophomonas maltophilia NOT DETECTED NOT DETECTED Final   Candida albicans NOT DETECTED NOT DETECTED Final   Candida auris NOT DETECTED NOT DETECTED Final   Candida glabrata NOT DETECTED NOT DETECTED Final   Candida krusei NOT DETECTED NOT DETECTED Final   Candida parapsilosis NOT DETECTED NOT DETECTED Final   Candida tropicalis NOT DETECTED NOT DETECTED Final   Cryptococcus neoformans/gattii NOT DETECTED NOT DETECTED Final   Methicillin resistance mecA/C NOT DETECTED NOT DETECTED Final    Comment: Performed at [facility], [address], [place], [place] [address]  Culture, blood (routine x 2)     Status: None   Collection Time: 07/10/20  6:47 PM   Specimen: BLOOD RIGHT FOREARM  Result Value Ref Range Status   Specimen Description BLOOD RIGHT FOREARM  Final   Special Requests   Final    BOTTLES DRAWN AEROBIC AND ANAEROBIC Blood Culture adequate volume   Culture   Final    NO GROWTH 5 DAYS Performed at [facility], [place] [address], [place], [place] [address]    Report Status 07/15/2020 FINAL  Final  Body fluid culture (includes gram stain)     Status: None   Collection Time: 07/11/20  9:30 AM   Specimen: Pleural Fluid  Result Value Ref Range Status   Specimen Description PLEURAL FLUID  Final   Special Requests LEFT  Final   Gram Stain   Final    WBC PRESENT,BOTH PMN AND MONONUCLEAR NO ORGANISMS SEEN CYTOSPIN SMEAR    Culture   Final    NO GROWTH 3 DAYS Performed at [facility], [place] [address], [place],  [address]    Report Status 07/14/2020 FINAL  Final  Urine culture  Status: Abnormal   Collection Time: 07/15/20 10:41 PM   Specimen: Urine, Random  Result Value Ref Range Status   Specimen Description URINE, RANDOM  Final   Special Requests   Final    NONE Performed at [facility], [address], [place], [place] [address]    Culture >=100,000 COLONIES/mL PROTEUS MIRABILIS (A)  Final   Report Status 07/18/2020 FINAL  Final   Organism ID, Bacteria PROTEUS MIRABILIS (A)  Final      Susceptibility   Proteus mirabilis - MIC*    AMPICILLIN <=2 SENSITIVE Sensitive     CEFAZOLIN 8 SENSITIVE Sensitive     CEFTRIAXONE <=0.25 SENSITIVE Sensitive     CIPROFLOXACIN <=0.25 SENSITIVE Sensitive     GENTAMICIN <=1 SENSITIVE Sensitive     IMIPENEM 2 SENSITIVE Sensitive     NITROFURANTOIN 128 RESISTANT Resistant     TRIMETH/SULFA <=20 SENSITIVE Sensitive     AMPICILLIN/SULBACTAM <=2 SENSITIVE Sensitive     PIP/TAZO <=4 SENSITIVE Sensitive     * >=100,000 COLONIES/mL PROTEUS MIRABILIS     Radiology Studies: No results found.  Scheduled Meds: . amLODipine  10 mg Oral QHS  . enoxaparin (LOVENOX) injection  40 mg Subcutaneous Daily  . ipratropium-albuterol  3 mL Nebulization BID  . levothyroxine  137 mcg Oral Q0600  . losartan  100 mg Oral QHS  . multivitamin with minerals  1 tablet Oral Daily  . sertraline  100 mg Oral QHS  . warfarin  7.5 mg Oral ONCE-1600  . Warfarin - Pharmacist Dosing Inpatient   Does not apply q1600   Continuous Infusions:   LOS: 8 days   Marylu Lund, MD [facility] Pager On Amion  If 7PM-7AM, please contact night-coverage 07/18/2020, 1:01 PM

## 2020-07-19 DIAGNOSIS — Z9689 Presence of other specified functional implants: Secondary | ICD-10-CM

## 2020-07-19 LAB — CBC
HCT: 26.2 % — ABNORMAL LOW (ref 36.0–46.0)
Hemoglobin: 7 g/dL — ABNORMAL LOW (ref 12.0–15.0)
MCH: 18.4 pg — ABNORMAL LOW (ref 26.0–34.0)
MCHC: 26.7 g/dL — ABNORMAL LOW (ref 30.0–36.0)
MCV: 68.8 fL — ABNORMAL LOW (ref 80.0–100.0)
Platelets: 560 10*3/uL — ABNORMAL HIGH (ref 150–400)
RBC: 3.81 MIL/uL — ABNORMAL LOW (ref 3.87–5.11)
RDW: 30.4 % — ABNORMAL HIGH (ref 11.5–15.5)
WBC: 6.6 10*3/uL (ref 4.0–10.5)
nRBC: 0 % (ref 0.0–0.2)

## 2020-07-19 LAB — PREPARE RBC (CROSSMATCH)

## 2020-07-19 LAB — PROTIME-INR
INR: 2.1 — ABNORMAL HIGH (ref 0.8–1.2)
Prothrombin Time: 22.6 seconds — ABNORMAL HIGH (ref 11.4–15.2)

## 2020-07-19 MED ORDER — SODIUM CHLORIDE 0.9 % IV SOLN
510.0000 mg | Freq: Once | INTRAVENOUS | Status: AC
  Administered 2020-07-19: 510 mg via INTRAVENOUS
  Filled 2020-07-19: qty 17

## 2020-07-19 MED ORDER — WARFARIN SODIUM 2.5 MG PO TABS
3.7500 mg | ORAL_TABLET | ORAL | Status: DC
  Administered 2020-07-21: 3.75 mg via ORAL
  Filled 2020-07-19: qty 1

## 2020-07-19 MED ORDER — WARFARIN SODIUM 7.5 MG PO TABS
7.5000 mg | ORAL_TABLET | ORAL | Status: DC
  Administered 2020-07-19 – 2020-07-20 (×2): 7.5 mg via ORAL
  Filled 2020-07-19 (×2): qty 1

## 2020-07-19 MED ORDER — SODIUM CHLORIDE 0.9% IV SOLUTION: Freq: Once | INTRAVENOUS | Status: AC

## 2020-07-19 NOTE — Progress Notes (Signed)
Physical Therapy Treatment Patient Details Name: Jaime Kelley MRN: [id number] DOB: [birth year] Today's Date: 07/19/2020    History of Present Illness Pt is a 69 y.o. female admitted 07/10/20 with SOB. Workup for anemia, large L-sided pleural effusion causing near collapse of L lung s/p chest tube placement. S/p thoracentesis on 9/19. PMH includes obesity, COPD (5L O2 baseline), DM, RA, HTN, PE (on chronic Coumadin).    PT Comments    Pt admitted with above diagnosis. Pt was able to sit EOB for 10 min with min guard assist.  Attempts at [facility] unsuccessful as pt unable to achieve standing even with total assist of 2 persons assisting.  Pt does not seem to give full effort and constantly asks to just go back to sleep.  Continue to provide encouragement for pt. Pt currently with functional limitations due to balance and endurance deficits. Pt will benefit from skilled PT to increase their independence and safety with mobility to allow discharge to the venue listed below.     Follow Up Recommendations  SNF;Supervision/Assistance - 24 hour     Equipment Recommendations   (defer to next venue)    Recommendations for Other Services       Precautions / Restrictions Precautions Precautions: Fall;Other (comment) Precaution Comments: Significant WOB at rest Restrictions Weight Bearing Restrictions: No    Mobility  Bed Mobility Overal bed mobility: Needs Assistance Bed Mobility: Supine to Sit;Sit to Supine Rolling: Mod assist   Supine to sit: Mod assist Sit to supine: Supervision   General bed mobility comments: max cues to initiate bed mobility, patient able to mobilize LEs to EOB, allowed for increased time and cues to push through elbow to sit trunk upright however patient does not initiate therefore mod A to complete. patient initiates laying herself back onto bed multiple times without physical assistance   Transfers Overall transfer level: Needs assistance   Transfers: Sit  to/from Stand Sit to Stand: Total assist;+2 physical assistance;From elevated surface         General transfer comment: Pt attempted to stand with +2 total assist with use of pad but even wtih max cues and total assist, pt barely clearing bottom off of bed. Pt does not appear to give full effort and this PT provided max encouragement. Constantly asking to lie back down.    Ambulation/Gait             General Gait Details: States she hasnt got OOB in months   Stairs             Wheelchair Mobility    Modified Rankin (Stroke Patients Only)       Balance Overall balance assessment: Needs assistance Sitting-balance support: No upper extremity supported;Feet unsupported Sitting balance-[name]: Fair Sitting balance - Comments: Can sit EOB with min guard assist. patient lays herself back onto bed 3 times requiring max encouragement                                    Cognition Arousal/Alertness: Awake/alert Behavior During Therapy: Flat affect Overall Cognitive Status: No family/caregiver present to determine baseline cognitive functioning                                 General Comments: max encouragement to participate       Exercises General Exercises - Lower Extremity Ankle Circles/Pumps: AROM;Both;10 reps;Seated  Long Arc Quad: AROM;Both;10 reps;Seated    General Comments General comments (skin integrity, edema, etc.): VSS on 3LO2      Pertinent Vitals/Pain Pain Assessment: Faces Faces Pain Scale: Hurts even more Pain Location: buttock, back Pain Descriptors / Indicators: Sore Pain Intervention(s): Limited activity within patient's tolerance;Monitored during session;Repositioned    Home Living                      Prior Function            PT Goals (current goals can now be found in the care plan section) Acute Rehab PT Goals Patient Stated Goal: "I want to take a nap" Progress towards PT goals: Not  progressing toward goals - comment (Self limiting, fatigue, SOB)    Frequency    Min 2X/week      PT Plan Current plan remains appropriate    Co-evaluation              AM-PAC PT "6 Clicks" Mobility   Outcome Measure  Help needed turning from your back to your side while in a flat bed without using bedrails?: A Lot Help needed moving from lying on your back to sitting on the side of a flat bed without using bedrails?: A Lot Help needed moving to and from a bed to a chair (including a wheelchair)?: A Lot Help needed standing up from a chair using your arms (e.g., wheelchair or bedside chair)?: Total Help needed to walk in hospital room?: Total Help needed climbing 3-5 steps with a railing? : Total 6 Click Score: 9    End of Session Equipment Utilized During Treatment: Oxygen Activity Tolerance: Patient limited by fatigue Patient left: in bed;with call bell/phone within reach;with bed alarm set Nurse Communication: Mobility status;Need for lift equipment PT Visit Diagnosis: Muscle weakness (generalized) (M62.81)     Time: 8638-1771 PT Time Calculation (min) (ACUTE ONLY): 18 min  Charges:  $Therapeutic Activity: 8-22 mins                     Fani Rotondo W,PT Acute Rehabilitation Services Pager:  [phone]  Office:  [place] 07/19/2020, 1:04 PM

## 2020-07-19 NOTE — Progress Notes (Signed)
ANTICOAGULATION CONSULT NOTE - Follow Up Consult  Pharmacy Consult for Heparin Indication: h/o DVT/PE  Allergies  Allergen Reactions  . Effexor [Venlafaxine Hydrochloride] Rash    Patient Measurements: Height: 5\' 2"  (157.5 cm) Weight: 78.5 kg (173 lb) IBW/kg (Calculated) : 50.1 Heparin Dosing Weight:    Vital Signs: Temp: 97.9 F (36.6 C) (09/27 0735) Temp Source: Oral (09/27 0505) BP: 116/63 (09/27 0735) Pulse Rate: 66 (09/27 0735)  Labs: Recent Labs    07/17/20 0546 07/18/20 0543 07/19/20 0517  HGB  --   --  7.0*  HCT  --   --  26.2*  PLT  --   --  560*  LABPROT 16.3* 19.7* 22.6*  INR 1.4* 1.7* 2.1*    Estimated Creatinine Clearance: 64.4 mL/min (by C-G formula based on SCr of 0.46 mg/dL).   Assessment: Anticoag: H/o DVT & PE (2008,2012,2014) on warfarin. LD 9/17. INR up to 2.1. Hgb 7 down. Plts 560.  - PTA regimen 7.5 mg daily except 3.75 mg on Wed (INR 1.1 on admit, compliance?)   Goal of Therapy:  INR 2-3 Monitor platelets by anticoagulation protocol: Yes   Plan:  Resume Coumadin 7.5mg  daily except 3.75mg  on Wed. D/c Lovenox Daily INR  Jaime Kelley, PharmD, BCPS Clinical Staff Pharmacist [email or web address] Alford Kelley, Jaime Kelley 07/19/2020,8:20 AM

## 2020-07-19 NOTE — Progress Notes (Signed)
PROGRESS NOTE    Jaime Kelley  QQP:[id number] DOB: [birth year] DOA: 07/10/2020 PCP: Laurey Morale, MD    Brief Narrative:  69 year old female with past medical history of obesity, COPD with chronic respiratory failure on 5 L nasal cannula, diabetes mellitus, rheumatoid arthritis, hypertension and PE on chronic Coumadin who presented to the emergency room with shortness of breath on 9/18.  Patient found to be anemic with a hemoglobin of 6.8, was at 13 3 years ago on her chart, subtherapeutic INR of 1.1 and a large left-sided pleural effusion causing near complete collapse of her left lung and requiring a nonrebreather to keep her oxygen saturations greater than 90%.  Pulmonary consulted for thoracentesis and patient was admitted to the hospitalist service.  Patient underwent thoracentesis on 9/19 with approximately 1300 cc of fluid removed, .  Breathing has since improved post procedure x-rays noted persistent complete opacification of left hemithorax.  Pulmonary took patient for bronchoscopy on 9/20 finding solidified mucous plug within the left mainstem which was able to be suctioned clear.  Following, pleural catheter placed.   Patient continues to show signs of improvement.  Follow-up chest x-ray notes reinflation of the lung.  Had extensive conversation with patient's daughter, who lives in [place], on 9/21.  Apparently, the patient lives in a townhome with a roommate.  She has not walked in well over a year although it is from atrophy and deconditioning, not a medical condition.  According to the patient, she has a roommate who cleans her when she has to use the bathroom, as she wears diapers.  The daughter suspects patient is in a hoarding type situation.  The patient affirms that the roommate could not lift her up and can barely care for herself.  She does not want to go to a skilled nursing facility.  Assessment & Plan:   Principal Problem:   Pleural effusion on left Active Problems:    Hypothyroidism   COPD GOLD III   Rheumatoid arthritis ([place])   Essential hypertension   Type 2 diabetes mellitus (HCC)   Anticoagulated on Coumadin   Collapsed lung   Microcytic anemia   Chest pain   Thrombocytosis (HCC)   Generalized weakness   Stage II pressure ulcer of right buttock (HCC)   Obesity (BMI 30-39.9)   Mucus plugging of bronchi  Principal Problem: COPD Gold with chronic respiratory failure on 5 L nasal cannula with large pleural effusion on left causing collapsed lung.  Status post thoracentesis followed by bronchoscopy and resolution of mucous plug and pleural catheter insertion.  Lung reexpanded.  Follow-up x-ray noted resolution of pleural effusion.    Noted blood cultures grew out 1 out of 4 for staph epi, likely contamination.  Chest tube has been d/c'd by pulmonary on 9/23. Pt to f/u with Pulmonary as outpatient   Active Problems:    Type 2 diabetes mellitus ([place]): Glucose trends currently stable. Glycemic trends reviewed    Anticoagulated on Coumadin: INR subtherapeutic on admission.  Was on heparin which was held for procedures.  Coumadin restarted 9/21. Concern made for noncompliance with meds prior to admit noted.  INR today 2.1  Hypothyroidism: Continue Synthroid.  TSH elevated at 28.727. T4 levels within normal limits. See above. Concerns were raised for noncompliance with meds. Will cont thyroid replacement, would recommend repeat TSH in 4-6 weeks after d/c  Stage 2 decubitus ulcer: from staying in bed.  Non-mobile.  Cont to provide wound care as needed  microcytic anemia: No evidence of  bleeding.  Hemoglobin on admission at 6.5 with an MCV of 63.  Status post transfusion and hemoglobin at 7.7 on 9/20 and has remained stable.  Hgb had remained in the 7 range, today 7.0. Recent labs reviewed. Pt noted to have iron level of 10 with ferritin 20. Consider ferriheme. Will give 1 unit PRBC today   Obesity: Patient meets criteria with BMI greater than  30  Essential hypertension: Continue Norvasc  Depression: Continue Zoloft.  Consulted psychiatry to see given her social situation and to establish patient's decision making capacity. Appreciate input. Patient is deemed to have no capacity at this point in time to refuse skilled nursing facility.  Social work currently following.  Pending skilled nursing placement at this time  DVT prophylaxis: coumadin Code Status: Full Family Communication: Pt in room, family not at bedside, had earlier discussed case with pt's daughter over phone  Status is: Inpatient  Remains inpatient appropriate because:Unsafe d/c plan   Dispo: The patient is from: Home              Anticipated d/c is to: SNF              Anticipated d/c date is: 2 days              Patient currently is medically stable to d/c. Just pending disposition       Consultants:   Pulmonary  Psychiatry  Procedures:   Status post thoracentesis on 9/19 with 1300 cc fluid removed  Status post bronchoscopy and pigtail catheter placement on 9/20  Chest tube removed 9/23  Antimicrobials: Anti-infectives (From admission, onward)   None      Subjective: No complaints this AM  Objective: Vitals:   07/19/20 1245 07/19/20 1300 07/19/20 1330 07/19/20 1400  BP: 110/67 116/66 105/63 115/63  Pulse: 82 79 78 78  Resp: 18 17 17 16   Temp: 98.5 F (36.9 C) 98.4 F (36.9 C) 98.4 F (36.9 C) 98.6 F (37 C)  TempSrc: Oral Oral Oral Oral  SpO2: 100% 98% 99% 100%  Weight:      Height:        Intake/Output Summary (Last 24 hours) at 07/19/2020 1404 Last data filed at 07/19/2020 1352 Gross per 24 hour  Intake 480 ml  Output 225 ml  Net 255 ml   Filed Weights   07/18/20 0531 07/19/20 0031 07/19/20 0505  Weight: 78 kg 79.8 kg 78.5 kg    Examination: General exam: Conversant, in no acute distress, laying in bed Respiratory system: normal chest rise, clear, no audible wheezing Cardiovascular system: regular rhythm,  s1-s2 Gastrointestinal system: Nondistended, nontender, pos BS Central nervous system: No seizures, no tremors Extremities: No cyanosis, no joint deformities Skin: No rashes, no pallor Psychiatry: Affect normal // no auditory hallucinations   Data Reviewed: I have personally reviewed following labs and imaging studies  CBC: Recent Labs  Lab 07/13/20 0845 07/14/20 0735 07/15/20 1000 07/19/20 0517  WBC 12.5* 11.3* 10.0 6.6  HGB 7.8* 7.5* 7.5* 7.0*  HCT 28.6* 28.3* 28.2* 26.2*  MCV 67.1* 68.4* 67.8* 68.8*  PLT 585* 609* 532* 425*   Basic Metabolic Panel: Recent Labs  Lab 07/13/20 0845  NA 130*  K 4.0  CL 91*  CO2 30  GLUCOSE 89  BUN 9  CREATININE 0.46  CALCIUM 8.5*   GFR: Estimated Creatinine Clearance: 64.4 mL/min (by C-G formula based on SCr of 0.46 mg/dL). Liver Function Tests: No results for input(s): AST, ALT, ALKPHOS, BILITOT, PROT, ALBUMIN in  the last 168 hours. No results for input(s): LIPASE, AMYLASE in the last 168 hours. No results for input(s): AMMONIA in the last 168 hours. Coagulation Profile: Recent Labs  Lab 07/15/20 1000 07/16/20 0544 07/17/20 0546 07/18/20 0543 07/19/20 0517  INR 1.3* 1.4* 1.4* 1.7* 2.1*   Cardiac Enzymes: No results for input(s): CKTOTAL, CKMB, CKMBINDEX, TROPONINI in the last 168 hours. BNP (last 3 results) No results for input(s): PROBNP in the last 8760 hours. HbA1C: No results for input(s): HGBA1C in the last 72 hours. CBG: Recent Labs  Lab 07/15/20 2009  GLUCAP 132*   Lipid Profile: No results for input(s): CHOL, HDL, LDLCALC, TRIG, CHOLHDL, LDLDIRECT in the last 72 hours. Thyroid Function Tests: No results for input(s): TSH, T4TOTAL, FREET4, T3FREE, THYROIDAB in the last 72 hours. Anemia Panel: No results for input(s): VITAMINB12, FOLATE, FERRITIN, TIBC, IRON, RETICCTPCT in the last 72 hours. Sepsis Labs: No results for input(s): PROCALCITON, LATICACIDVEN in the last 168 hours.  Recent Results (from the  past 240 hour(s))  SARS Coronavirus 2 by RT PCR (hospital order, performed in [facility] hospital lab) Nasopharyngeal Nasopharyngeal Swab     Status: None   Collection Time: 07/10/20  5:35 PM   Specimen: Nasopharyngeal Swab  Result Value Ref Range Status   SARS Coronavirus 2 NEGATIVE NEGATIVE Final    Comment: (NOTE) SARS-CoV-2 target nucleic acids are NOT DETECTED.  The SARS-CoV-2 RNA is generally detectable in upper and lower respiratory specimens during the acute phase of infection. The lowest concentration of SARS-CoV-2 viral copies this assay can detect is 250 copies / mL. A negative result does not preclude SARS-CoV-2 infection and should not be used as the sole basis for treatment or other patient management decisions.  A negative result may occur with improper specimen collection / handling, submission of specimen other than nasopharyngeal swab, presence of viral mutation(s) within the areas targeted by this assay, and inadequate number of viral copies (<250 copies / mL). A negative result must be combined with clinical observations, patient history, and epidemiological information.  Fact Sheet for Patients:   StrictlyIdeas.no  Fact Sheet for Healthcare Providers: BankingDealers.co.za  This test is not yet approved or  cleared by the [place] FDA and has been authorized for detection and/or diagnosis of SARS-CoV-2 by FDA under an Emergency Use Authorization (EUA).  This EUA will remain in effect (meaning this test can be used) for the duration of the COVID-19 declaration under Section 564(b)(1) of the Act, 21 U.S.C. section 360bbb-3(b)(1), unless the authorization is terminated or revoked sooner.  Performed at [facility], [place] [address], [place], [place] [address]   Culture, blood (routine x 2)     Status: Abnormal   Collection Time: 07/10/20  5:36 PM   Specimen: BLOOD  Result Value Ref Range Status    Specimen Description BLOOD LEFT ANTECUBITAL  Final   Special Requests   Final    BOTTLES DRAWN AEROBIC AND ANAEROBIC Blood Culture adequate volume   Culture  Setup Time   Final    GRAM POSITIVE COCCI IN CLUSTERS IN BOTH AEROBIC AND ANAEROBIC BOTTLES CRITICAL RESULT CALLED TO, READ BACK BY AND VERIFIED WITH: PHARMD K PIERCE [phone] FCP    Culture (A)  Final    STAPHYLOCOCCUS SIMULANS STAPHYLOCOCCUS CAPITIS STAPHYLOCOCCUS EPIDERMIDIS THE SIGNIFICANCE OF ISOLATING THIS ORGANISM FROM A SINGLE SET OF BLOOD CULTURES WHEN MULTIPLE SETS ARE DRAWN IS UNCERTAIN. PLEASE NOTIFY THE MICROBIOLOGY DEPARTMENT WITHIN ONE WEEK IF SPECIATION AND SENSITIVITIES ARE REQUIRED.  Performed at [facility], [place] [address] [place], [place] [address]    Report Status 07/13/2020 FINAL  Final  Blood Culture ID Panel (Reflexed)     Status: Abnormal   Collection Time: 07/10/20  5:36 PM  Result Value Ref Range Status   Enterococcus faecalis NOT DETECTED NOT DETECTED Final   Enterococcus Faecium NOT DETECTED NOT DETECTED Final   Listeria monocytogenes NOT DETECTED NOT DETECTED Final   Staphylococcus species DETECTED (A) NOT DETECTED Final    Comment: CRITICAL RESULT CALLED TO, READ BACK BY AND VERIFIED WITH: PHARMD K PIERCE [id number] FCP    Staphylococcus aureus (BCID) NOT DETECTED NOT DETECTED Final   Staphylococcus epidermidis DETECTED (A) NOT DETECTED Final    Comment: CRITICAL RESULT CALLED TO, READ BACK BY AND VERIFIED WITH: PHARMD K PIERCE [phone] FCP    Staphylococcus lugdunensis NOT DETECTED NOT DETECTED Final   Streptococcus species NOT DETECTED NOT DETECTED Final   Streptococcus agalactiae NOT DETECTED NOT DETECTED Final   Streptococcus pneumoniae NOT DETECTED NOT DETECTED Final   Streptococcus pyogenes NOT DETECTED NOT DETECTED Final   A.calcoaceticus-baumannii NOT DETECTED NOT DETECTED Final   Bacteroides fragilis NOT DETECTED NOT DETECTED Final   Enterobacterales NOT DETECTED NOT  DETECTED Final   Enterobacter cloacae complex NOT DETECTED NOT DETECTED Final   Escherichia coli NOT DETECTED NOT DETECTED Final   Klebsiella aerogenes NOT DETECTED NOT DETECTED Final   Klebsiella oxytoca NOT DETECTED NOT DETECTED Final   Klebsiella pneumoniae NOT DETECTED NOT DETECTED Final   Proteus species NOT DETECTED NOT DETECTED Final   Salmonella species NOT DETECTED NOT DETECTED Final   Serratia marcescens NOT DETECTED NOT DETECTED Final   Haemophilus influenzae NOT DETECTED NOT DETECTED Final   Neisseria meningitidis NOT DETECTED NOT DETECTED Final   Pseudomonas aeruginosa NOT DETECTED NOT DETECTED Final   Stenotrophomonas maltophilia NOT DETECTED NOT DETECTED Final   Candida albicans NOT DETECTED NOT DETECTED Final   Candida auris NOT DETECTED NOT DETECTED Final   Candida glabrata NOT DETECTED NOT DETECTED Final   Candida krusei NOT DETECTED NOT DETECTED Final   Candida parapsilosis NOT DETECTED NOT DETECTED Final   Candida tropicalis NOT DETECTED NOT DETECTED Final   Cryptococcus neoformans/gattii NOT DETECTED NOT DETECTED Final   Methicillin resistance mecA/C NOT DETECTED NOT DETECTED Final    Comment: Performed at [facility], [address], [place], [place] [address]  Culture, blood (routine x 2)     Status: None   Collection Time: 07/10/20  6:47 PM   Specimen: BLOOD RIGHT FOREARM  Result Value Ref Range Status   Specimen Description BLOOD RIGHT FOREARM  Final   Special Requests   Final    BOTTLES DRAWN AEROBIC AND ANAEROBIC Blood Culture adequate volume   Culture   Final    NO GROWTH 5 DAYS Performed at [facility], [place] [address] [place], [place] [address]    Report Status 07/15/2020 FINAL  Final  Body fluid culture (includes gram stain)     Status: None   Collection Time: 07/11/20  9:30 AM   Specimen: Pleural Fluid  Result Value Ref Range Status   Specimen Description PLEURAL FLUID  Final   Special Requests LEFT  Final   Gram Stain   Final     WBC PRESENT,BOTH PMN AND MONONUCLEAR NO ORGANISMS SEEN CYTOSPIN SMEAR    Culture   Final    NO GROWTH 3 DAYS Performed at [facility], [place]  [address], [place], [place] [address]    Report Status 07/14/2020 FINAL  Final  Urine culture     Status: Abnormal   Collection Time: 07/15/20 10:41 PM   Specimen: Urine, Random  Result Value Ref Range Status   Specimen Description URINE, RANDOM  Final   Special Requests   Final    NONE Performed at [facility], [place] [address] [place], [place] [address]    Culture >=100,000 COLONIES/mL PROTEUS MIRABILIS (A)  Final   Report Status 07/18/2020 FINAL  Final   Organism ID, Bacteria PROTEUS MIRABILIS (A)  Final      Susceptibility   Proteus mirabilis - MIC*    AMPICILLIN <=2 SENSITIVE Sensitive     CEFAZOLIN 8 SENSITIVE Sensitive     CEFTRIAXONE <=0.25 SENSITIVE Sensitive     CIPROFLOXACIN <=0.25 SENSITIVE Sensitive     GENTAMICIN <=1 SENSITIVE Sensitive     IMIPENEM 2 SENSITIVE Sensitive     NITROFURANTOIN 128 RESISTANT Resistant     TRIMETH/SULFA <=20 SENSITIVE Sensitive     AMPICILLIN/SULBACTAM <=2 SENSITIVE Sensitive     PIP/TAZO <=4 SENSITIVE Sensitive     * >=100,000 COLONIES/mL PROTEUS MIRABILIS     Radiology Studies: No results found.  Scheduled Meds: . amLODipine  10 mg Oral QHS  . ipratropium-albuterol  3 mL Nebulization BID  . levothyroxine  137 mcg Oral Q0600  . losartan  100 mg Oral QHS  . multivitamin with minerals  1 tablet Oral Daily  . sertraline  100 mg Oral QHS  . [START ON 07/21/2020] warfarin  3.75 mg Oral Once per day on Wed  . warfarin  7.5 mg Oral Once per day on Sun Mon Tue Thu Fri Sat  . Warfarin - Pharmacist Dosing Inpatient   Does not apply q1600   Continuous Infusions:   LOS: 9 days   Marylu Lund, MD [facility] Pager On Amion  If 7PM-7AM, please contact night-coverage 07/19/2020, 2:04 PM

## 2020-07-20 ENCOUNTER — Inpatient Hospital Stay: Payer: Medicare Other | Admitting: Adult Health

## 2020-07-20 LAB — CBC
HCT: 28.9 % — ABNORMAL LOW (ref 36.0–46.0)
Hemoglobin: 8 g/dL — ABNORMAL LOW (ref 12.0–15.0)
MCH: 19.8 pg — ABNORMAL LOW (ref 26.0–34.0)
MCHC: 27.7 g/dL — ABNORMAL LOW (ref 30.0–36.0)
MCV: 71.4 fL — ABNORMAL LOW (ref 80.0–100.0)
Platelets: 506 10*3/uL — ABNORMAL HIGH (ref 150–400)
RBC: 4.05 MIL/uL (ref 3.87–5.11)
RDW: 30.4 % — ABNORMAL HIGH (ref 11.5–15.5)
WBC: 7.1 10*3/uL (ref 4.0–10.5)
nRBC: 0 % (ref 0.0–0.2)

## 2020-07-20 LAB — TYPE AND SCREEN
ABO/RH(D): O POS
Antibody Screen: NEGATIVE
Unit division: 0

## 2020-07-20 LAB — PROTIME-INR
INR: 2.3 — ABNORMAL HIGH (ref 0.8–1.2)
Prothrombin Time: 24.4 seconds — ABNORMAL HIGH (ref 11.4–15.2)

## 2020-07-20 LAB — BPAM RBC
Blood Product Expiration Date: 202110282359
ISSUE DATE / TIME: 202109271205
Unit Type and Rh: 5100

## 2020-07-20 MED ORDER — ALPRAZOLAM 1 MG PO TABS: 1.0000 mg | ORAL_TABLET | Freq: Three times a day (TID) | ORAL | 5 refills | Status: DC | PRN

## 2020-07-20 MED ORDER — IPRATROPIUM-ALBUTEROL 0.5-2.5 (3) MG/3ML IN SOLN
3.0000 mL | Freq: Four times a day (QID) | RESPIRATORY_TRACT | Status: DC | PRN
  Administered 2020-07-21 – 2020-07-23 (×3): 3 mL via RESPIRATORY_TRACT
  Filled 2020-07-20 (×3): qty 3

## 2020-07-20 MED ORDER — GLUCERNA SHAKE PO LIQD
237.0000 mL | Freq: Three times a day (TID) | ORAL | Status: DC
  Administered 2020-07-20 – 2020-07-27 (×17): 237 mL via ORAL

## 2020-07-20 NOTE — Progress Notes (Signed)
PROGRESS NOTE    Jaime Kelley  KXF:[id number] DOB: 1950/12/25 DOA: 07/10/2020 PCP: Laurey Morale, MD    Brief Narrative:  69 year old female with past medical history of obesity, COPD with chronic respiratory failure on 5 L nasal cannula, diabetes mellitus, rheumatoid arthritis, hypertension and PE on chronic Coumadin who presented to the emergency room with shortness of breath on 9/18.  Patient found to be anemic with a hemoglobin of 6.8, was at 13 3 years ago on her chart, subtherapeutic INR of 1.1 and a large left-sided pleural effusion causing near complete collapse of her left lung and requiring a nonrebreather to keep her oxygen saturations greater than 90%.  Pulmonary consulted for thoracentesis and patient was admitted to the hospitalist service.  Patient underwent thoracentesis on 9/19 with approximately 1300 cc of fluid removed, .  Breathing has since improved post procedure x-rays noted persistent complete opacification of left hemithorax.  Pulmonary took patient for bronchoscopy on 9/20 finding solidified mucous plug within the left mainstem which was able to be suctioned clear.  Following, pleural catheter placed.   Patient continues to show signs of improvement.  Follow-up chest x-ray notes reinflation of the lung.  Had extensive conversation with patient's daughter, who lives in [place], on 9/21.  Apparently, the patient lives in a townhome with a roommate.  She has not walked in well over a year although it is from atrophy and deconditioning, not a medical condition.  According to the patient, she has a roommate who cleans her when she has to use the bathroom, as she wears diapers.  The daughter suspects patient is in a hoarding type situation.  The patient affirms that the roommate could not lift her up and can barely care for herself.  She does not want to go to a skilled nursing facility.  Assessment & Plan:   Principal Problem:   Pleural effusion on left Active Problems:    Hypothyroidism   COPD GOLD III   Rheumatoid arthritis ([place])   Essential hypertension   Type 2 diabetes mellitus (HCC)   Anticoagulated on Coumadin   Collapsed lung   Microcytic anemia   Chest pain   Thrombocytosis (HCC)   Generalized weakness   Stage II pressure ulcer of right buttock (HCC)   Obesity (BMI 30-39.9)   Mucus plugging of bronchi  Principal Problem: COPD Gold with chronic respiratory failure on 5 L nasal cannula with large pleural effusion on left causing collapsed lung.  Status post thoracentesis followed by bronchoscopy and resolution of mucous plug and pleural catheter insertion.  Lung reexpanded.  Follow-up x-ray noted resolution of pleural effusion.    Noted blood cultures grew out 1 out of 4 for staph epi, likely contamination.  Chest tube has been d/c'd by pulmonary on 9/23. Pt to f/u with Pulmonary as outpatient   Active Problems:    Type 2 diabetes mellitus ([place]): Glucose trends currently stable. Glycemic trends reviewed, stable    Anticoagulated on Coumadin: INR subtherapeutic on admission.  Was on heparin which was held for procedures.  Coumadin restarted 9/21. Concern made for noncompliance with meds prior to admit noted.  INR today 2.3  Hypothyroidism: Continue Synthroid.  TSH elevated at 28.727. T4 levels within normal limits. See above. Concerns were raised for noncompliance with meds. Will cont thyroid replacement, would recommend repeat TSH in 4-6 weeks after d/c  Stage 2 decubitus ulcer: from staying in bed.  Non-mobile.  Cont to provide wound care as needed  microcytic anemia: No evidence  of bleeding.  Hemoglobin on admission at 6.5 with an MCV of 63.  Status post transfusion and hemoglobin at 7.7 on 9/20 and has remained stable.  Hgb had remained in the 7 range, today 7.0. Recent labs reviewed. Pt noted to have iron level of 10 with ferritin 20. Given 1 unit of PRBC's with appropriate correction of hgb. Given low iron level, have given one dose of  feraheme  Obesity: Patient meets criteria with BMI greater than 30  Essential hypertension: Continue Norvasc  Depression: Continue Zoloft.  Consulted psychiatry to see given her social situation and to establish patient's decision making capacity. Appreciate input. Patient is deemed to have no capacity at this point in time to refuse skilled nursing facility.  TOC currently following.  Pending skilled nursing placement at this time  DVT prophylaxis: coumadin Code Status: Full Family Communication: Pt in room, family not at bedside, had earlier discussed case with pt's daughter over phone  Status is: Inpatient  Remains inpatient appropriate because:Unsafe d/c plan   Dispo: The patient is from: Home              Anticipated d/c is to: SNF              Anticipated d/c date is: 2 days              Patient currently is medically stable to d/c. Just pending disposition       Consultants:   Pulmonary  Psychiatry  Procedures:   Status post thoracentesis on 9/19 with 1300 cc fluid removed  Status post bronchoscopy and pigtail catheter placement on 9/20  Chest tube removed 9/23  Antimicrobials: Anti-infectives (From admission, onward)   None      Subjective: Pt declined to sit in chair today, stating "I'm too tired and weak"  Objective: Vitals:   07/20/20 0050 07/20/20 0743 07/20/20 1124 07/20/20 1643  BP: 111/63 (!) 110/50 132/78 124/68  Pulse: 71 70 78 80  Resp: 20 17 18 17   Temp: 98.3 F (36.8 C) 98 F (36.7 C) 98.2 F (36.8 C) 98.1 F (36.7 C)  TempSrc: Oral Oral Oral   SpO2: 98% 97% 95% 95%  Weight:      Height:        Intake/Output Summary (Last 24 hours) at 07/20/2020 1908 Last data filed at 07/20/2020 1807 Gross per 24 hour  Intake 680 ml  Output 500 ml  Net 180 ml   Filed Weights   07/18/20 0531 07/19/20 0031 07/19/20 0505  Weight: 78 kg 79.8 kg 78.5 kg    Examination: General exam: Awake, laying in bed, in nad Respiratory system: Normal  respiratory effort, no wheezing Cardiovascular system: regular rate, s1, s2 Gastrointestinal system: Soft, nondistended, positive BS Central nervous system: CN2-12 grossly intact, strength intact Extremities: Perfused, no clubbing Skin: Normal skin turgor, no notable skin lesions seen Psychiatry: Mood normal // no visual hallucinations   Data Reviewed: I have personally reviewed following labs and imaging studies  CBC: Recent Labs  Lab 07/14/20 0735 07/15/20 1000 07/19/20 0517 07/20/20 0421  WBC 11.3* 10.0 6.6 7.1  HGB 7.5* 7.5* 7.0* 8.0*  HCT 28.3* 28.2* 26.2* 28.9*  MCV 68.4* 67.8* 68.8* 71.4*  PLT 609* 532* 560* 144*   Basic Metabolic Panel: No results for input(s): NA, K, CL, CO2, GLUCOSE, BUN, CREATININE, CALCIUM, MG, PHOS in the last 168 hours. GFR: Estimated Creatinine Clearance: 64.4 mL/min (by C-G formula based on SCr of 0.46 mg/dL). Liver Function Tests: No results for  input(s): AST, ALT, ALKPHOS, BILITOT, PROT, ALBUMIN in the last 168 hours. No results for input(s): LIPASE, AMYLASE in the last 168 hours. No results for input(s): AMMONIA in the last 168 hours. Coagulation Profile: Recent Labs  Lab 07/16/20 0544 07/17/20 0546 07/18/20 0543 07/19/20 0517 07/20/20 0421  INR 1.4* 1.4* 1.7* 2.1* 2.3*   Cardiac Enzymes: No results for input(s): CKTOTAL, CKMB, CKMBINDEX, TROPONINI in the last 168 hours. BNP (last 3 results) No results for input(s): PROBNP in the last 8760 hours. HbA1C: No results for input(s): HGBA1C in the last 72 hours. CBG: Recent Labs  Lab 07/15/20 2009  GLUCAP 132*   Lipid Profile: No results for input(s): CHOL, HDL, LDLCALC, TRIG, CHOLHDL, LDLDIRECT in the last 72 hours. Thyroid Function Tests: No results for input(s): TSH, T4TOTAL, FREET4, T3FREE, THYROIDAB in the last 72 hours. Anemia Panel: No results for input(s): VITAMINB12, FOLATE, FERRITIN, TIBC, IRON, RETICCTPCT in the last 72 hours. Sepsis Labs: No results for input(s):  PROCALCITON, LATICACIDVEN in the last 168 hours.  Recent Results (from the past 240 hour(s))  Body fluid culture (includes gram stain)     Status: None   Collection Time: 07/11/20  9:30 AM   Specimen: Pleural Fluid  Result Value Ref Range Status   Specimen Description PLEURAL FLUID  Final   Special Requests LEFT  Final   Gram Stain   Final    WBC PRESENT,BOTH PMN AND MONONUCLEAR NO ORGANISMS SEEN CYTOSPIN SMEAR    Culture   Final    NO GROWTH 3 DAYS Performed at [facility], [address], [place], [place] [address]    Report Status 07/14/2020 FINAL  Final  Urine culture     Status: Abnormal   Collection Time: 07/15/20 10:41 PM   Specimen: Urine, Random  Result Value Ref Range Status   Specimen Description URINE, RANDOM  Final   Special Requests   Final    NONE Performed at [facility], [place] [address] [place], [place] [address]    Culture >=100,000 COLONIES/mL PROTEUS MIRABILIS (A)  Final   Report Status 07/18/2020 FINAL  Final   Organism ID, Bacteria PROTEUS MIRABILIS (A)  Final      Susceptibility   Proteus mirabilis - MIC*    AMPICILLIN <=2 SENSITIVE Sensitive     CEFAZOLIN 8 SENSITIVE Sensitive     CEFTRIAXONE <=0.25 SENSITIVE Sensitive     CIPROFLOXACIN <=0.25 SENSITIVE Sensitive     GENTAMICIN <=1 SENSITIVE Sensitive     IMIPENEM 2 SENSITIVE Sensitive     NITROFURANTOIN 128 RESISTANT Resistant     TRIMETH/SULFA <=20 SENSITIVE Sensitive     AMPICILLIN/SULBACTAM <=2 SENSITIVE Sensitive     PIP/TAZO <=4 SENSITIVE Sensitive     * >=100,000 COLONIES/mL PROTEUS MIRABILIS     Radiology Studies: No results found.  Scheduled Meds: . amLODipine  10 mg Oral QHS  . feeding supplement (GLUCERNA SHAKE)  237 mL Oral TID BM  . levothyroxine  137 mcg Oral Q0600  . losartan  100 mg Oral QHS  . multivitamin with minerals  1 tablet Oral Daily  . sertraline  100 mg Oral QHS  . [START ON 07/21/2020] warfarin  3.75 mg Oral Once per day on Wed  . warfarin  7.5 mg  Oral Once per day on Sun Mon Tue Thu Fri Sat  . Warfarin - Pharmacist Dosing Inpatient   Does not apply q1600   Continuous Infusions:   LOS: 10 days   Marylu Lund, MD [facility] Pager On Amion  If 7PM-7AM, please contact night-coverage 07/20/2020, 7:08 PM

## 2020-07-20 NOTE — Plan of Care (Signed)
  Problem: Education: Goal: Knowledge of General Education information will improve Description: Including pain rating scale, medication(s)/side effects and non-pharmacologic comfort measures Outcome: Progressing   Problem: Clinical Measurements: Goal: Will remain free from infection Outcome: Progressing   

## 2020-07-20 NOTE — Telephone Encounter (Signed)
Since Librium was on back order, I sent in Xanax to use instead

## 2020-07-20 NOTE — Telephone Encounter (Signed)
Patient's daughter [name] called and given the information from Dr. Sarajane Jews. She says the patient is in the hospital since 07/10/20 and that she will be going to rehab when she's discharged. She asked for the company that was used previously in January. I advised Huston Foley from [facility] [phone] as noted in [place] 11/18/19.

## 2020-07-20 NOTE — Consult Note (Signed)
   [facility] CM Inpatient Consult   07/20/2020  Jaime Kelley November 22, 1950 [id number]   Follow up note:    Patient discussed in Unit progression meeting for a skilled nursing level of care.  Plan: If patient goes to a [facility] affiliated SNF, will have [facility] nurse to follow.  Will follow til transition/disposition is known.  For questions, please contact:  [name], RN BSN [facility] Liaison  [phone] business mobile phone Toll free office [phone]  Fax number: [phone] [email or web address]

## 2020-07-20 NOTE — Progress Notes (Signed)
Nutrition Follow-up  DOCUMENTATION CODES:   Obesity unspecified  INTERVENTION:   -Continue Magic cup TID with meals, each supplement provides 290 kcal and 9 grams of protein -Continue MVI with minerals daily -Glucerna Shake po TID, each supplement provides 220 kcal and 10 grams of protein  NUTRITION DIAGNOSIS:   Inadequate oral intake related to inability to eat as evidenced by NPO status.  Progressing; advanced to PO diet on 07/14/20  GOAL:   Patient will meet greater than or equal to 90% of their needs  Progressing   MONITOR:   PO intake, Supplement acceptance, Labs, Weight trends, Skin, I & O's  REASON FOR ASSESSMENT:   Low Braden    ASSESSMENT:   Jaime Kelley is a 69 y.o. 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  9/19- s/p thoracentesis (1300 ml straw clear fluid removed from lt pleural site) 9/21- s/p Flexible video fiberoptic bronchoscopy and biopsies (revealed mucous plugging); lt chest tube placed 9/23- chest tube removed  Reviewed I/O's: +836 ml x 24 hours and +1.6 L since admission  UOP: 200 ml x 24 hours  Attempted to speak with pt via call to hospital room phone, however, unable to reach.   Per [facility] note on 07/15/20, lt buttock wound is a partial thickness wound related to MASD.   Pt with variable intake; noted meal completion 25-85%. Per discussion with RN last week, pt is very food selective.   Per [facility] team notes, plan for SNF placement at discharge.   Labs reviewed.   Diet Order:   Diet Order            Diet heart healthy/carb modified Room service appropriate? Yes; Fluid consistency: Thin  Diet effective now                 EDUCATION NEEDS:   Not appropriate for education at this time  Skin:  Skin Assessment: Skin Integrity  Issues: Skin Integrity Issues:: Other (Comment) Stage II: - Other: partial thickness wounf to lt buttock secondary to MASD  Last BM:  07/14/20  Height:   Ht Readings from Last 1 Encounters:  07/12/20 5\' 2"  (1.575 m)    Weight:   Wt Readings from Last 1 Encounters:  07/19/20 78.5 kg    Ideal Body Weight:  50 kg  BMI:  Body mass index is 31.64 kg/m.  Estimated Nutritional Needs:   Kcal:  1800-2000  Protein:  100-115 grams  Fluid:  > 1.8 L    Loistine Chance, RD, LDN, [place] Registered Dietitian II Certified Diabetes Care and Education Specialist Please refer to [facility] for RD and/or RD on-call/weekend/after hours pager

## 2020-07-20 NOTE — Progress Notes (Signed)
RE: Jaime Kelley Date of Birth:  1951/06/28 Date: 07/20/20  Please be advised that the above-named patient will require a short-term nursing home stay - anticipated 30 days or less for rehabilitation and strengthening.  The plan is for return home.

## 2020-07-20 NOTE — Progress Notes (Signed)
RE: Jaime Kelley Date of Birth: 03/06/1951 Date: 07/20/20   Please be advised that the above-named patient will require a short-term nursing home stay - anticipated 30 days or less for rehabilitation and strengthening.  The plan is for return home.

## 2020-07-20 NOTE — Progress Notes (Addendum)
ANTICOAGULATION CONSULT NOTE - Follow Up Consult  Pharmacy Consult for Coumadin Indication: h/o DVT/PE  Allergies  Allergen Reactions  . Effexor [Venlafaxine Hydrochloride] Rash    Patient Measurements: Height: 5\' 2"  (157.5 cm) Weight: 78.5 kg (173 lb) IBW/kg (Calculated) : 50.1 Heparin Dosing Weight:    Vital Signs: Temp: 98 F (36.7 C) (09/28 0743) Temp Source: Oral (09/28 0050) BP: 110/50 (09/28 0743) Pulse Rate: 70 (09/28 0743)  Labs: Recent Labs    07/18/20 0543 07/19/20 0517 07/20/20 0421  HGB  --  7.0* 8.0*  HCT  --  26.2* 28.9*  PLT  --  560* 506*  LABPROT 19.7* 22.6* 24.4*  INR 1.7* 2.1* 2.3*    Estimated Creatinine Clearance: 64.4 mL/min (by C-G formula based on SCr of 0.46 mg/dL).   Assessment: Anticoag: H/o DVT & PE (2008,2012,2014) on warfarin. LD 9/17. INR up to 2.3. Hgb 7>8 after transfusion 9/27. Plts WNL - PTA regimen 7.5 mg daily except 3.75 mg on Wed (INR 1.1 on admit, compliance?)  Goal of Therapy:  INR 2-3 Monitor platelets by anticoagulation protocol: Yes   Plan:  Resume Coumadin 7.5mg  daily except 3.75mg  on Wed. Daily INR  Kashayla Ungerer S. [place], PharmD, BCPS Clinical Staff Pharmacist [email or web address] Alford Highland, [employer] 07/20/2020,7:51 AM

## 2020-07-21 ENCOUNTER — Inpatient Hospital Stay (HOSPITAL_COMMUNITY): Payer: Medicare Other

## 2020-07-21 LAB — COMPREHENSIVE METABOLIC PANEL
ALT: 14 U/L (ref 0–44)
AST: 16 U/L (ref 15–41)
Albumin: 2.7 g/dL — ABNORMAL LOW (ref 3.5–5.0)
Alkaline Phosphatase: 61 U/L (ref 38–126)
Anion gap: 7 (ref 5–15)
BUN: 5 mg/dL — ABNORMAL LOW (ref 8–23)
CO2: 33 mmol/L — ABNORMAL HIGH (ref 22–32)
Calcium: 8.5 mg/dL — ABNORMAL LOW (ref 8.9–10.3)
Chloride: 90 mmol/L — ABNORMAL LOW (ref 98–111)
Creatinine, Ser: 0.46 mg/dL (ref 0.44–1.00)
GFR calc Af Amer: >60 mL/min (ref >60)
GFR calc non Af Amer: >60 mL/min (ref >60)
Glucose, Bld: 108 mg/dL — ABNORMAL HIGH (ref 70–99)
Potassium: 4 mmol/L (ref 3.5–5.1)
Sodium: 130 mmol/L — ABNORMAL LOW (ref 135–145)
Total Bilirubin: 0.4 mg/dL (ref 0.3–1.2)
Total Protein: 5.9 g/dL — ABNORMAL LOW (ref 6.5–8.1)

## 2020-07-21 LAB — CBC
HCT: 31.4 % — ABNORMAL LOW (ref 36.0–46.0)
Hemoglobin: 8.6 g/dL — ABNORMAL LOW (ref 12.0–15.0)
MCH: 19.6 pg — ABNORMAL LOW (ref 26.0–34.0)
MCHC: 27.4 g/dL — ABNORMAL LOW (ref 30.0–36.0)
MCV: 71.7 fL — ABNORMAL LOW (ref 80.0–100.0)
Platelets: 512 10*3/uL — ABNORMAL HIGH (ref 150–400)
RBC: 4.38 MIL/uL (ref 3.87–5.11)
RDW: 30.9 % — ABNORMAL HIGH (ref 11.5–15.5)
WBC: 7.6 10*3/uL (ref 4.0–10.5)
nRBC: 0 % (ref 0.0–0.2)

## 2020-07-21 LAB — PROTIME-INR
INR: 2.2 — ABNORMAL HIGH (ref 0.8–1.2)
Prothrombin Time: 23.7 seconds — ABNORMAL HIGH (ref 11.4–15.2)

## 2020-07-21 MED ORDER — AMLODIPINE BESYLATE 5 MG PO TABS
5.0000 mg | ORAL_TABLET | Freq: Every day | ORAL | Status: DC
  Administered 2020-07-21 – 2020-07-26 (×6): 5 mg via ORAL
  Filled 2020-07-21 (×6): qty 1

## 2020-07-21 NOTE — Progress Notes (Addendum)
PROGRESS NOTE    Jaime Kelley  XHB:[id number]  DOB: 1951-01-30  DOA: 07/10/2020 PCP: Laurey Morale, MD Outpatient Specialists:   Hospital course:  69 year old female with COPD on 5 L O2, DM2, RA, HTN and history of PEs on Coumadin was admitted 30 with shortness of breath.  Work-up revealed large left pleural effusion with collapse of left lung.  Work-up also revealed new decrease in hemoglobin to 6.8 down from 13.31-year ago.  She was also subtherapeutic on her INR and it was clear she was not taking her Synthroid.  Patient underwent thoracentesis 9/19 with 1.3 L fluid removed.  Follow-up chest x-ray showed persistent opacification and she underwent bronchoscopy 9/20 with removal of solidified mucous plug within the left mainstem bronchus.  Some modest reinflation of the lung thereafter.  Patient is bedbound and has been for the past year but it is unclear why she has become bedbound.  She lives with her roommate who provides her with food and also changes her diapers for her.  Patient's daughter lives in [place] and son is in [place] but travels a lot.  Patient wanted to go home however this was not considered safe.  Patient was evaluated by psychiatry and found to have NO capacity to make decisions.  It was decided that patient would be sent to rehab.  Discussion with patient's daughter, patient does not have healthcare power of attorney.  Patient's daughter, who lives in [place], is her eldest child.  Patient's daughter suspects the patient is in a hoarding type situation.  Patient's roommate apparently barely can take care of her own self but does the best she can to help the patient.   Subjective:  Patient herself has no complaints other than feeling very fatigued.  She states she would like to go home.  Admits to being chronically short of breath but not worse than usual.   Objective: Vitals:   07/20/20 1643 07/20/20 1954 07/21/20 0813 07/21/20 1130  BP: 124/68 (!)  130/59 (!) 119/92 106/72  Pulse: 80 81 73 77  Resp: 17 18 20  (!) 21  Temp: 98.1 F (36.7 C) 98.6 F (37 C) 99.2 F (37.3 C) 98.6 F (37 C)  TempSrc:  Oral Oral Oral  SpO2: 95% 93% 90% 95%  Weight:      Height:        Intake/Output Summary (Last 24 hours) at 07/21/2020 1428 Last data filed at 07/20/2020 1807 Gross per 24 hour  Intake 180 ml  Output --  Net 180 ml   Filed Weights   07/18/20 0531 07/19/20 0031 07/19/20 0505  Weight: 78 kg 79.8 kg 78.5 kg     Exam:  General: Chronically ill-appearing female lying back in bed looking much older than stated age with tachypnea but no labored breathing. Eyes: sclera anicteric, conjuctiva mild injection bilaterally CVS: S1-S2, regular  Respiratory: Markedly decreased air entry bilaterally. GI: NABS, soft, NT  LE: 1+ edema bilaterally  Psych: patient is speaks coherently but has very poor judgment and insight.  Assessment & Plan:   Acute on chronic respiratory failure secondary to collapsed left lung superimposed on COPD. Last chest x-ray on 9/23 shows persistent left lower lobe collapse Will repeat chest x-ray today She status post thoracentesis of 1.3 L in bronchoscopy with removal of mucous plug 9/23 Chest tube was discontinued 9/23 It is unclear to me what patient's baseline is in terms of respiratory status as she is bedbound for unclear reasons.  HTN Blood pressure low  normal on amlodipine 10 Decreased amlodipine 5 and follow blood pressure  DM2 Sugars well controlled on present regimen  Microcytic anemia Hemoglobin was 6.5 on admission, patient underwent 1 unit PRBC and hemoglobin has remained stable since.  She is also been hemodynamically stable. She is profoundly low iron with ferritin of 20. She was given Feraheme here For further work-up for IDA/microcytic anemia as an outpatient is warranted including GI evaluation if this is not already been done. This was discussed with the patient's daughter at length and  she will address with her PCP.  Chronic PEs Patient has been maintained on warfarin at home however was subtherapeutic when she got here It does not seem that warfarin is a good drug for this patient given no noncompliance However if she goes to a nursing home they will be able to monitor her better.  Hyponatremia Unclear etiology, this is been present since 2018 and stable Possibly reset Osmo stat No further work-up needed  Hypothyroidism Patient had elevated TSH, it was likely she was not taking her Synthroid at home Synthroid was restarted Repeat TSH as an outpatient by PCP.  Stage II decubitus ulcer Patient getting wound care here however patient will need to try to get out of bed for this to resolve  Disposition Patient is to go to rehab against her wishes as she has been deemed without capacity by psychiatry.  Per discussion with daughter, patient does not have a healthcare power of attorney Patient's eldest child is her daughter who lives in [place], is a son who lives in [place] but apparently travels a lot.  Patient situation at home is clearly unsafe. Currently Adult Protective Services have been called to assist with better placement.  DVT prophylaxis: Warfarin Code Status: Full Family Communication: Spoke at length with patient's daughter Jaime Kelley today and urged her to come visit her mother in [place] to assess for herself her condition and living situation. Disposition Plan:   Patient is from: Home  Anticipated Discharge Location: Rehab  Barriers to Discharge: Awaiting rehab bed and need to evaluate chest x-ray  Is patient medically stable for Discharge: Possibly tomorrow  Consultants:  Pulmonary critical care  Psychiatry  Procedures:  Status post thoracentesis  Status post bronchoscopy  Antimicrobials:  None   Data Reviewed:  Basic Metabolic Panel: Recent Labs  Lab 07/21/20 1125  NA 130*  K 4.0  CL 90*  CO2 33*  GLUCOSE 108*    BUN 5*  CREATININE 0.46  CALCIUM 8.5*   Liver Function Tests: Recent Labs  Lab 07/21/20 1125  AST 16  ALT 14  ALKPHOS 61  BILITOT 0.4  PROT 5.9*  ALBUMIN 2.7*   No results for input(s): LIPASE, AMYLASE in the last 168 hours. No results for input(s): AMMONIA in the last 168 hours. CBC: Recent Labs  Lab 07/15/20 1000 07/19/20 0517 07/20/20 0421 07/21/20 1125  WBC 10.0 6.6 7.1 7.6  HGB 7.5* 7.0* 8.0* 8.6*  HCT 28.2* 26.2* 28.9* 31.4*  MCV 67.8* 68.8* 71.4* 71.7*  PLT 532* 560* 506* 512*   Cardiac Enzymes: No results for input(s): CKTOTAL, CKMB, CKMBINDEX, TROPONINI in the last 168 hours. BNP (last 3 results) No results for input(s): PROBNP in the last 8760 hours. CBG: Recent Labs  Lab 07/15/20 2009  GLUCAP 132*    Recent Results (from the past 240 hour(s))  Urine culture     Status: Abnormal   Collection Time: 07/15/20 10:41 PM   Specimen: Urine, Random  Result Value  Ref Range Status   Specimen Description URINE, RANDOM  Final   Special Requests   Final    NONE Performed at [facility], [address] [place], [place] [address]    Culture >=100,000 COLONIES/mL PROTEUS MIRABILIS (A)  Final   Report Status 07/18/2020 FINAL  Final   Organism ID, Bacteria PROTEUS MIRABILIS (A)  Final      Susceptibility   Proteus mirabilis - MIC*    AMPICILLIN <=2 SENSITIVE Sensitive     CEFAZOLIN 8 SENSITIVE Sensitive     CEFTRIAXONE <=0.25 SENSITIVE Sensitive     CIPROFLOXACIN <=0.25 SENSITIVE Sensitive     GENTAMICIN <=1 SENSITIVE Sensitive     IMIPENEM 2 SENSITIVE Sensitive     NITROFURANTOIN 128 RESISTANT Resistant     TRIMETH/SULFA <=20 SENSITIVE Sensitive     AMPICILLIN/SULBACTAM <=2 SENSITIVE Sensitive     PIP/TAZO <=4 SENSITIVE Sensitive     * >=100,000 COLONIES/mL PROTEUS MIRABILIS      Studies: No results found.   Scheduled Meds: . amLODipine  10 mg Oral QHS  . feeding supplement (GLUCERNA SHAKE)  237 mL Oral TID BM  . levothyroxine  137 mcg  Oral Q0600  . losartan  100 mg Oral QHS  . multivitamin with minerals  1 tablet Oral Daily  . sertraline  100 mg Oral QHS  . warfarin  3.75 mg Oral Once per day on Wed  . warfarin  7.5 mg Oral Once per day on Sun Mon Tue Thu Fri Sat  . Warfarin - Pharmacist Dosing Inpatient   Does not apply q1600   Continuous Infusions:  Principal Problem:   Pleural effusion on left Active Problems:   Hypothyroidism   COPD GOLD III   Rheumatoid arthritis (HCC)   Essential hypertension   Type 2 diabetes mellitus (HCC)   Anticoagulated on Coumadin   Collapsed lung   Microcytic anemia   Chest pain   Thrombocytosis (HCC)   Generalized weakness   Stage II pressure ulcer of right buttock (HCC)   Obesity (BMI 30-39.9)   Mucus plugging of bronchi     Jaime Kelley Tublu Ximenna Fonseca, [facility]  If 7PM-7AM, please contact night-coverage [email or web address] Password [email or web address] 07/21/2020, 2:28 PM    LOS: 11 days

## 2020-07-21 NOTE — Progress Notes (Signed)
ANTICOAGULATION CONSULT NOTE - Follow Up Consult  Pharmacy Consult for Coumadin Indication: h/o DVT/PE  Allergies  Allergen Reactions  . Effexor [Venlafaxine Hydrochloride] Rash    Patient Measurements: Height: 5\' 2"  (157.5 cm) Weight: 78.5 kg (173 lb) IBW/kg (Calculated) : 50.1 Heparin Dosing Weight:    Vital Signs: Temp: 98.6 F (37 C) (09/29 1130) Temp Source: Oral (09/29 1130) BP: 106/72 (09/29 1130) Pulse Rate: 77 (09/29 1130)  Labs: Recent Labs    07/19/20 0517 07/19/20 0517 07/20/20 0421 07/21/20 1125  HGB 7.0*   < > 8.0* 8.6*  HCT 26.2*  --  28.9* 31.4*  PLT 560*  --  506* 512*  LABPROT 22.6*  --  24.4* 23.7*  INR 2.1*  --  2.3* 2.2*  CREATININE  --   --   --  0.46   < > = values in this interval not displayed.    Estimated Creatinine Clearance: 64.4 mL/min (by C-G formula based on SCr of 0.46 mg/dL).   Assessment:  Anticoag: H/o DVT & PE (2008,2012,2014) on warfarin. LD 9/17. INR 2.2. Hgb up to 8.6 after transfusion 9/27. Plts WNL - PTA regimen 7.5 mg daily except 3.75 mg on Wed (INR 1.1 on admit, compliance?)  Goal of Therapy:  INR 2-3 Monitor platelets by anticoagulation protocol: Yes   Plan:  Resume Coumadin 7.5mg  daily except 3.75mg  on Wed. Daily INR  Herberto Ledwell S. Alford Highland, PharmD, BCPS Clinical Staff Pharmacist [email or web address] Alford Highland, [employer] 07/21/2020,12:47 PM

## 2020-07-21 NOTE — Progress Notes (Signed)
Occupational Therapy Treatment Patient Details Name: Jaime Kelley MRN: [id number] DOB: 02-28-1951 Today's Date: 07/21/2020    History of present illness Pt is a 69 y.o. female admitted 07/10/20 with SOB. Workup for anemia, large L-sided pleural effusion causing near collapse of L lung s/p chest tube placement. S/p thoracentesis on 9/19. PMH includes obesity, COPD (5L O2 baseline), DM, RA, HTN, PE (on chronic Coumadin).   OT comments  Pt. Refused to sit EOB today. Pt. Was able to perform bed mobility rolling at max A level. Pt. Requires cues to use B UE to reach for rails. Pt. Performed ADLs with hob elevated. Will continue to follow.   Follow Up Recommendations  SNF    Equipment Recommendations  3 in 1 bedside commode;Wheelchair (measurements OT);Wheelchair cushion (measurements OT);Hospital bed    Recommendations for Other Services      Precautions / Restrictions Precautions Precautions: Fall;Other (comment) Restrictions Weight Bearing Restrictions: No       Mobility Bed Mobility Overal bed mobility: Needs Assistance   Rolling: Max assist            Transfers                      Balance                                           ADL either performed or assessed with clinical judgement   ADL Overall ADL's : Needs assistance/impaired Eating/Feeding: Set up   Grooming: Wash/dry hands;Wash/dry face;Brushing hair;Moderate assistance   Upper Body Bathing: Maximal assistance;Bed level       Upper Body Dressing : Maximal assistance;Bed level                   Functional mobility during ADLs:  (max a rolling in bed) General ADL Comments: Pt. refused to sit EOB and ADLs performed in bed wtih hob elevated.      Vision   Vision Assessment?: No apparent visual deficits   Perception     Praxis      Cognition Arousal/Alertness: Awake/alert Behavior During Therapy: WFL for tasks assessed/performed Overall Cognitive Status:  No family/caregiver present to determine baseline cognitive functioning                                          Exercises     Shoulder Instructions       General Comments      Pertinent Vitals/ Pain       Pain Assessment: No/denies pain  Home Living                                          Prior Functioning/Environment              Frequency  Min 2X/week        Progress Toward Goals  OT Goals(current goals can now be found in the care plan section)  Progress towards OT goals: Progressing toward goals  Acute Rehab OT Goals Patient Stated Goal: "I want to take a nap" OT Goal Formulation: With patient Time For Goal Achievement: 08/02/20 Potential to Achieve Goals: Fair ADL Goals Pt Will Perform Grooming: with  min guard assist;sitting Pt Will Perform Upper Body Bathing: with min assist;sitting Pt Will Perform Upper Body Dressing: with min assist;sitting Pt Will Transfer to Toilet: with mod assist;squat pivot transfer  Plan Discharge plan remains appropriate    Co-evaluation                 AM-PAC OT "6 Clicks" Daily Activity     Outcome Measure   Help from another person eating meals?: A Little Help from another person taking care of personal grooming?: A Lot Help from another person toileting, which includes using toliet, bedpan, or urinal?: Total Help from another person bathing (including washing, rinsing, drying)?: A Lot Help from another person to put on and taking off regular upper body clothing?: A Lot Help from another person to put on and taking off regular lower body clothing?: Total 6 Click Score: 11    End of Session Equipment Utilized During Treatment: Oxygen  OT Visit Diagnosis: Unsteadiness on feet (R26.81);Muscle weakness (generalized) (M62.81);Other symptoms and signs involving cognitive function;Dizziness and giddiness (R42);Pain Pain - Right/Left: Left   Activity Tolerance Patient limited by  fatigue   Patient Left in bed;with call bell/phone within reach;with bed alarm set   Nurse Communication Mobility status        Time: 2094-7096 OT Time Calculation (min): 33 min  Charges: OT General Charges $OT Visit: 1 Visit OT Treatments $Self Care/Home Management : 23-37 mins  Reece Packer OT/L    Alazne Quant 07/21/2020, 9:31 AM

## 2020-07-22 ENCOUNTER — Encounter (HOSPITAL_COMMUNITY): Payer: Self-pay | Admitting: Family Medicine

## 2020-07-22 LAB — PROTIME-INR
INR: 2.1 — ABNORMAL HIGH (ref 0.8–1.2)
Prothrombin Time: 23.1 s — ABNORMAL HIGH (ref 11.4–15.2)

## 2020-07-22 NOTE — Progress Notes (Signed)
CSW spoke with patient's daughter about SNF placement, daughter chose [facility], CSW confirmed availability. CSW also messaged MD in reference to patient being able to get COVID vaccine once medically stable for dc, waiting on an answer. CSW will continue to follow.

## 2020-07-22 NOTE — Progress Notes (Signed)
NAME:  Jaime Kelley, MRN:  [id number], DOB:  Jun 16, 1951, LOS: 12 ADMISSION DATE:  07/10/2020, CONSULTATION DATE: 9/18 REFERRING MD:  TRH, CHIEF COMPLAINT:  SOB   Brief History   69 y/o F, largely bed bound for unclear reasons, admitted 9/18 with SOB.  Found to have a large left pleural effusion in the setting of mucus plugging.  Underwent FOB with removal of a hard mucus plug and chest tube placement for drainage of effusion. Pleural fluid removed 1.3L and exudative by protein. Chest tube removed.  On 9/29 a CXR was completed with recurrence of left pleural effusion. PCCM called back for evaluation.   Past Medical History  Hypothyroidism RA Discoid Lupus DM  COPD  Tobacco Abuse - former 2ppd smoker Hx PE HTN Anxiety   Significant Hospital Events   9/18 Admit  9/30 PCCM called back for recurrent effusion   Consults:    Procedures:    Significant Diagnostic Tests:   CT Chest 9/18 >> large left pleural effusion with complete collapse of left lung  CXR 9/29 >> recurrent left effusion   Micro Data:     Antimicrobials:    Interim history/subjective:  Afebrile / no leukocytosis  No increase in O2 needs   Objective   Blood pressure 123/64, pulse 77, temperature 98 F (36.7 C), temperature source Oral, resp. rate 17, height 5\' 2"  (1.575 m), weight 78.9 kg, SpO2 97 %.        Intake/Output Summary (Last 24 hours) at 07/22/2020 1207 Last data filed at 07/22/2020 1139 Gross per 24 hour  Intake 480 ml  Output 1350 ml  Net -870 ml   Filed Weights   07/19/20 0031 07/19/20 0505 07/22/20 0018  Weight: 79.8 kg 78.5 kg 78.9 kg    Examination: General: chronically ill appearing adult female lying in bed in NAD HEENT: MM pink/moist, [place] O2, anicteric, moist cough  Neuro: Awake, alert to self, speech clear, MAE  CV: s1s2 RRR, no m/r/g PULM: mild prolonged exp phase, crackles on R with wheezing  GI: soft, bsx4 active  Extremities: warm/dry, no edema  Skin: no rashes  or lesions       Resolved Hospital Problem list      Assessment & Plan:   Recurrent Left Exudative Pleural Effusion  Hx Mucus Plugging  Deconditioning / Bed Bound at Baseline  Initial chest tube with ~ 1.3L removed, exudative by protein, cytology negative. O2 needs have improved overall.  She is in no distress.   -assess left pleural space with [place], see above -will discuss utility of repeat thoracentesis given recurrent accumulation.  Would repeat cytology.  -follow intermittent CXR  -no evidence of mucus plugging on CXR / tracheal deviation  -pulmonary hygiene - IS, mobilize, turn with right side down  Best practice:  Per Primary   Labs   CBC: Recent Labs  Lab 07/19/20 0517 07/20/20 0421 07/21/20 1125  WBC 6.6 7.1 7.6  HGB 7.0* 8.0* 8.6*  HCT 26.2* 28.9* 31.4*  MCV 68.8* 71.4* 71.7*  PLT 560* 506* 512*    Basic Metabolic Panel: Recent Labs  Lab 07/21/20 1125  NA 130*  K 4.0  CL 90*  CO2 33*  GLUCOSE 108*  BUN 5*  CREATININE 0.46  CALCIUM 8.5*   GFR: Estimated Creatinine Clearance: 64.5 mL/min (by C-G formula based on SCr of 0.46 mg/dL). Recent Labs  Lab 07/19/20 0517 07/20/20 0421 07/21/20 1125  WBC 6.6 7.1 7.6    Liver Function Tests: Recent Labs  Lab  07/21/20 1125  AST 16  ALT 14  ALKPHOS 61  BILITOT 0.4  PROT 5.9*  ALBUMIN 2.7*   No results for input(s): LIPASE, AMYLASE in the last 168 hours. No results for input(s): AMMONIA in the last 168 hours.  ABG    Component Value Date/Time   PHART 7.404 07/10/2020 2132   PCO2ART 56.5 (H) 07/10/2020 2132   PO2ART 89 07/10/2020 2132   HCO3 35.3 (H) 07/10/2020 2132   TCO2 37 (H) 07/10/2020 2132   O2SAT 97.0 07/10/2020 2132     Coagulation Profile: Recent Labs  Lab 07/18/20 0543 07/19/20 0517 07/20/20 0421 07/21/20 1125 07/22/20 0903  INR 1.7* 2.1* 2.3* 2.2* 2.1*    Cardiac Enzymes: No results for input(s): CKTOTAL, CKMB, CKMBINDEX, TROPONINI in the last 168  hours.  HbA1C: Hgb A1c MFr Bld  Date/Time Value Ref Range Status  08/14/2017 01:42 PM 6.0 4.6 - 6.5 % Final    Comment:    Glycemic Control Guidelines for People with Diabetes:Non Diabetic:  <6%Goal of Therapy: <7%Additional Action Suggested:  >8%   01/18/2017 07:20 PM 6.1 (H) 4.8 - 5.6 % Final    Comment:    (NOTE)         Pre-diabetes: 5.7 - 6.4         Diabetes: >6.4         Glycemic control for adults with diabetes: <7.0     CBG: Recent Labs  Lab 07/15/20 2009  GLUCAP 132*    Critical care time: n/a    Noe Gens, MSN, NP-C [facility] 07/22/2020, 12:08 PM   Please see [email or web address] for pager details.

## 2020-07-22 NOTE — Progress Notes (Signed)
Patient's verbalizes anxiousness after a phone call from daughter. Chatterjee. MD paged to notify for a prn anxiety medication. Primary RN notified.

## 2020-07-22 NOTE — Progress Notes (Signed)
PROGRESS NOTE    Jaime Kelley  NID:[id number]  DOB: 04-26-1951  DOA: 07/10/2020 PCP: Jaime Morale, MD Outpatient Specialists:   Hospital course:  69 year old female with COPD on 5 L O2, DM2, RA, HTN and history of PEs on Coumadin was admitted 69 with shortness of breath.  Work-up revealed large left pleural effusion with collapse of left lung.  Work-up also revealed new decrease in hemoglobin to 6.8 down from 13.31-year ago.  She was also subtherapeutic on her INR and it was clear she was not taking her Synthroid.  Patient underwent thoracentesis 9/19 with 1.3 L fluid removed.  Follow-up chest x-ray showed persistent opacification and she underwent bronchoscopy 9/20 with removal of solidified mucous plug within the left mainstem bronchus.  Some modest reinflation of the lung thereafter.  Patient is bedbound and has been for the past year but it is unclear why she has become bedbound.  She lives with her roommate who provides her with food and also changes her diapers for her.  Patient's daughter lives in [place] and son is in [place] but travels a lot.  Patient wanted to go home however this was not considered safe.  Patient was evaluated by psychiatry and found to have NO capacity to make decisions.  It was decided that patient would be sent to rehab.  Discussion with patient's daughter, patient does not have healthcare power of attorney.  Patient's daughter, who lives in [place], is her eldest child.  Patient's daughter suspects the patient is in a hoarding type situation.  Patient's roommate apparently barely can take care of her own self but does the best she can to help the patient.   Subjective:  Patient continues to feel short of breath and unwell.  Notes that no one has been to visit her not even her roommate.  I noted my long conversation with her daughter yesterday and she said "I do not think my daughter can even take care of herself".  Continues to be short of breath as  she was before without change.  Objective: Vitals:   07/22/20 0752 07/22/20 1101 07/22/20 1137 07/22/20 1200  BP: (!) 111/57  123/64 (!) 120/59  Pulse: 72 76 77 66  Resp: 17 16 17 17   Temp: 98.9 F (37.2 C)  98 F (36.7 C) 97.8 F (36.6 C)  TempSrc: Oral  Oral Oral  SpO2: 97% 96% 97% 97%  Weight:      Height:        Intake/Output Summary (Last 24 hours) at 07/22/2020 1549 Last data filed at 07/22/2020 1139 Gross per 24 hour  Intake 480 ml  Output 1350 ml  Net -870 ml   Filed Weights   07/19/20 0031 07/19/20 0505 07/22/20 0018  Weight: 79.8 kg 78.5 kg 78.9 kg     Exam:  General: Chronically ill-appearing female lying on her left side with unlabored tachypnea.   Eyes: sclera anicteric, conjuctiva mild injection bilaterally CVS: S1-S2, regular  Respiratory: Markedly decreased air entry bilaterally. GI: NABS, soft, NT  LE: 1+ edema bilaterally  Psych: patient is speaks coherently but has very poor judgment and insight.  Assessment & Plan:   Acute on chronic respiratory failure secondary to collapsed left lung superimposed on COPD. Repeat chest x-ray shows recurrence of opacification/fluid as before Have requested PCCM to see the patient, appreciate their note--they were still deciding on the utility of repeat thoracentesis.  Recommending repeat cytology if it is done. She is status post thoracentesis of 1.3  L via chest tube, it was discontinued 07/15/2020  She is status post bronchoscopy with removal of mucous plug 9/23 It is unclear to me what patient's baseline is in terms of respiratory status as she is bedbound for unclear reasons.  Chronic PEs Patient has been maintained on warfarin at home however was subtherapeutic when she got here Warfarin presently being managed by pharmacy here. It does not seem that warfarin is a good drug for this patient given no noncompliance However if she goes to a nursing home they will be able to monitor her better.  HTN Blood  pressure improved on decreased amlodipine 5 mg  DM2 Sugars well controlled on present regimen  Microcytic anemia Hemoglobin was 6.5 on admission, patient underwent 1 unit PRBC and hemoglobin has remained stable since.  She is also been hemodynamically stable. She is profoundly low iron with ferritin of 20. She was given Feraheme here For further work-up for IDA/microcytic anemia as an outpatient is warranted including GI evaluation if this is not already been done. This was discussed with the patient's daughter at length and she will address with her PCP.  Hyponatremia Unclear etiology, very likely SIADH given extensive lung disease This is been present since 2018 and stable No further work-up needed  Hypothyroidism Patient had elevated TSH, it was likely she was not taking her Synthroid at home Synthroid was restarted Repeat TSH as an outpatient by PCP.  Stage II decubitus ulcer Patient getting wound care here however patient will need to try to get out of bed for this to resolve  Disposition Patient is to go to rehab against her wishes as she has been deemed without capacity by psychiatry.  Per discussion with daughter, patient does not have a healthcare power of attorney Patient's eldest child is her daughter who lives in [place], is a son who lives in [place] but apparently travels a lot.  Patient situation at home is clearly unsafe. Currently Adult Protective Services have been called to assist with better placement.  DVT prophylaxis: Warfarin Code Status: Full Family Communication: Spoke at length yesterday with patient's daughter Jaime Kelley today and urged her to come visit her mother in [place] to assess for herself her condition and living situation. Disposition Plan:   Patient is from: Home  Anticipated Discharge Location: Rehab  Barriers to Discharge: Awaiting rehab bed and need to evaluate chest x-ray  Is patient medically stable for Discharge: Possibly  tomorrow  Consultants:  Pulmonary critical care  Psychiatry  Procedures:  Status post thoracentesis  That is post chest tube  Status post bronchoscopy  Antimicrobials:  None   Data Reviewed:  Basic Metabolic Panel: Recent Labs  Lab 07/21/20 1125  NA 130*  K 4.0  CL 90*  CO2 33*  GLUCOSE 108*  BUN 5*  CREATININE 0.46  CALCIUM 8.5*   Liver Function Tests: Recent Labs  Lab 07/21/20 1125  AST 16  ALT 14  ALKPHOS 61  BILITOT 0.4  PROT 5.9*  ALBUMIN 2.7*   No results for input(s): LIPASE, AMYLASE in the last 168 hours. No results for input(s): AMMONIA in the last 168 hours. CBC: Recent Labs  Lab 07/19/20 0517 07/20/20 0421 07/21/20 1125  WBC 6.6 7.1 7.6  HGB 7.0* 8.0* 8.6*  HCT 26.2* 28.9* 31.4*  MCV 68.8* 71.4* 71.7*  PLT 560* 506* 512*   Cardiac Enzymes: No results for input(s): CKTOTAL, CKMB, CKMBINDEX, TROPONINI in the last 168 hours. BNP (last 3 results) No results for input(s): PROBNP  in the last 8760 hours. CBG: Recent Labs  Lab 07/15/20 2009  GLUCAP 132*    Recent Results (from the past 240 hour(s))  Urine culture     Status: Abnormal   Collection Time: 07/15/20 10:41 PM   Specimen: Urine, Random  Result Value Ref Range Status   Specimen Description URINE, RANDOM  Final   Special Requests   Final    NONE Performed at [facility], [address], [place], [place] [address]    Culture >=100,000 COLONIES/mL PROTEUS MIRABILIS (A)  Final   Report Status 07/18/2020 FINAL  Final   Organism ID, Bacteria PROTEUS MIRABILIS (A)  Final      Susceptibility   Proteus mirabilis - MIC*    AMPICILLIN <=2 SENSITIVE Sensitive     CEFAZOLIN 8 SENSITIVE Sensitive     CEFTRIAXONE <=0.25 SENSITIVE Sensitive     CIPROFLOXACIN <=0.25 SENSITIVE Sensitive     GENTAMICIN <=1 SENSITIVE Sensitive     IMIPENEM 2 SENSITIVE Sensitive     NITROFURANTOIN 128 RESISTANT Resistant     TRIMETH/SULFA <=20 SENSITIVE Sensitive     AMPICILLIN/SULBACTAM <=2  SENSITIVE Sensitive     PIP/TAZO <=4 SENSITIVE Sensitive     * >=100,000 COLONIES/mL PROTEUS MIRABILIS      Studies: DG Chest Port 1 View  Result Date: 07/21/2020 CLINICAL DATA:  Shortness of breath EXAM: PORTABLE CHEST 1 VIEW COMPARISON:  July 15, 2020 FINDINGS: There is consolidation throughout the left mid and lower lung regions with questionable superimposed left pleural effusion. The right lung is clear. Heart is upper normal in size with pulmonary vascularity normal. No adenopathy. There is aortic atherosclerosis. No bone lesions. IMPRESSION: Consolidation throughout much of the left mid and lower lung regions with suspected small pleural effusions superimposed on the left. Right lung clear. Heart upper normal in size. No adenopathy. Aortic Atherosclerosis (ICD10-I70.0). Electronically Signed   By: Lowella Grip III M.D.   On: 07/21/2020 14:53     Scheduled Meds: . amLODipine  5 mg Oral QHS  . feeding supplement (GLUCERNA SHAKE)  237 mL Oral TID BM  . levothyroxine  137 mcg Oral Q0600  . losartan  100 mg Oral QHS  . multivitamin with minerals  1 tablet Oral Daily  . sertraline  100 mg Oral QHS   Continuous Infusions:  Principal Problem:   Pleural effusion on left Active Problems:   Hypothyroidism   COPD GOLD III   Rheumatoid arthritis (HCC)   Essential hypertension   Type 2 diabetes mellitus (HCC)   Anticoagulated on Coumadin   Collapsed lung   Microcytic anemia   Chest pain   Thrombocytosis (HCC)   Generalized weakness   Stage II pressure ulcer of right buttock (HCC)   Obesity (BMI 30-39.9)   Mucus plugging of bronchi     Ileane Sando Tublu Mecca Guitron, [facility]  If 7PM-7AM, please contact night-coverage [email or web address] Password [email or web address] 07/22/2020, 3:49 PM    LOS: 12 days

## 2020-07-22 NOTE — Progress Notes (Signed)
Physical Therapy Treatment Patient Details Name: Jaime Kelley MRN: [id number] DOB: 26-Jul-1951 Today's Date: 07/22/2020    History of Present Illness Pt is a 69 y.o. female admitted 07/10/20 with SOB. Workup for anemia, large L-sided pleural effusion causing near collapse of L lung s/p chest tube placement. S/p thoracentesis on 9/19. PMH includes obesity, COPD (5L O2 baseline), DM, RA, HTN, PE (on chronic Coumadin).    PT Comments    Pt admitted with above diagnosis. Pt self limiting today not agreeable to sit EOB but only wanted to lie down and be postiioned on her side.  Worked to get her comfortable on her side with pillows.  Pt with very flat affect. Pt currently with functional limitations due to balance and endurance deficits. Pt will benefit from skilled PT to increase their independence and safety with mobility to allow discharge to the venue listed below.     Follow Up Recommendations  SNF;Supervision/Assistance - 24 hour     Equipment Recommendations   (defer to next venue)    Recommendations for Other Services       Precautions / Restrictions Precautions Precautions: Fall;Other (comment) Precaution Comments: Significant WOB at rest Restrictions Weight Bearing Restrictions: No    Mobility  Bed Mobility Overal bed mobility: Needs Assistance Bed Mobility: Supine to Sit;Sit to Supine Rolling: Max assist   Supine to sit: Max assist;+2 for physical assistance;HOB elevated Sit to supine: Max assist;+2 for physical assistance   General bed mobility comments: max cues to initiate bed mobility, patient able to mobilize LEs to EOB, allowed for increased time and cues to push through elbow to sit trunk upright however patient does not initiate therefore max A to complete. Pt immediatly laid back down and refused to sit back up and just wanted to turn to other side.  Therfore assisted pt with getting comfortable using pillows to incr comfort.    Transfers                     Ambulation/Gait                 Stairs             Wheelchair Mobility    Modified Rankin (Stroke Patients Only)       Balance Overall balance assessment: Needs assistance Sitting-balance support: Bilateral upper extremity supported;Feet supported Sitting balance-[name]: Zero Sitting balance - Comments: would not sit EOB today.                                     Cognition Arousal/Alertness: Awake/alert Behavior During Therapy: WFL for tasks assessed/performed Overall Cognitive Status: No family/caregiver present to determine baseline cognitive functioning                                 General Comments: max encouragement to participate       Exercises      General Comments General comments (skin integrity, edema, etc.): VSS on 3LO2      Pertinent Vitals/Pain Pain Assessment: Faces Faces Pain Scale: Hurts whole lot Pain Location: buttock, back Pain Descriptors / Indicators: Sore Pain Intervention(s): Limited activity within patient's tolerance;Monitored during session;Repositioned    Home Living                      Prior Function  PT Goals (current goals can now be found in the care plan section) Acute Rehab PT Goals Patient Stated Goal: "I want to take a nap" Progress towards PT goals: Not progressing toward goals - comment (self limiting)    Frequency    Min 2X/week      PT Plan Current plan remains appropriate    Co-evaluation              AM-PAC PT "6 Clicks" Mobility   Outcome Measure  Help needed turning from your back to your side while in a flat bed without using bedrails?: A Lot Help needed moving from lying on your back to sitting on the side of a flat bed without using bedrails?: A Lot Help needed moving to and from a bed to a chair (including a wheelchair)?: A Lot Help needed standing up from a chair using your arms (e.g., wheelchair or bedside  chair)?: Total Help needed to walk in hospital room?: Total Help needed climbing 3-5 steps with a railing? : Total 6 Click Score: 9    End of Session Equipment Utilized During Treatment: Oxygen Activity Tolerance: Patient limited by fatigue Patient left: in bed;with call bell/phone within reach;with bed alarm set Nurse Communication: Mobility status;Need for lift equipment PT Visit Diagnosis: Muscle weakness (generalized) (M62.81)     Time: 1001-1010 PT Time Calculation (min) (ACUTE ONLY): 9 min  Charges:  $Therapeutic Activity: 8-22 mins                     Deago Burruss W,PT Acute Rehabilitation Services Pager:  [phone]  Office:  [place] 07/22/2020, 2:00 PM

## 2020-07-22 NOTE — Progress Notes (Addendum)
ANTICOAGULATION CONSULT NOTE - Follow Up Consult  Pharmacy Consult for Coumadin Indication: h/o DVT/PE  Allergies  Allergen Reactions  . Effexor [Venlafaxine Hydrochloride] Rash    Patient Measurements: Height: 5\' 2"  (157.5 cm) Weight: 78.9 kg (174 lb) IBW/kg (Calculated) : 50.1 Heparin Dosing Weight:    Vital Signs: Temp: 97.8 F (36.6 C) (09/30 1200) Temp Source: Oral (09/30 1200) BP: 120/59 (09/30 1200) Pulse Rate: 66 (09/30 1200)  Labs: Recent Labs    07/20/20 0421 07/21/20 1125 07/22/20 0903  HGB 8.0* 8.6*  --   HCT 28.9* 31.4*  --   PLT 506* 512*  --   LABPROT 24.4* 23.7* 23.1*  INR 2.3* 2.2* 2.1*  CREATININE  --  0.46  --     Estimated Creatinine Clearance: 64.5 mL/min (by C-G formula based on SCr of 0.46 mg/dL).   Assessment: 7 yoF with hx DVT and PE (2008, 2012, 2014) on warfarin PTA admitted with SOB. Warfarin subtherapeutic on admit and bridged with enoxaparin.  Pulmonary planning procedure tomorrow, pharmacy asked to hold warfarin tonight. Discussed with Dr. Wonda Amis, will likely just restart warfarin after procedure tomorrow.  Goal of Therapy:  INR 2-3 Monitor platelets by anticoagulation protocol: Yes   Plan:  -Hold warfarin tonight  Arrie Senate, PharmD, BCPS Clinical Pharmacist [phone] Please check AMION for all [place] numbers 07/22/2020

## 2020-07-22 NOTE — Progress Notes (Signed)
ANTICOAGULATION CONSULT NOTE - Follow Up Consult  Pharmacy Consult for Coumadin Indication: h/o DVT/PE  Allergies  Allergen Reactions  . Effexor [Venlafaxine Hydrochloride] Rash    Patient Measurements: Height: 5\' 2"  (157.5 cm) Weight: 78.9 kg (174 lb) IBW/kg (Calculated) : 50.1 Heparin Dosing Weight:    Vital Signs: Temp: 97.8 F (36.6 C) (09/30 1200) Temp Source: Oral (09/30 1200) BP: 120/59 (09/30 1200) Pulse Rate: 66 (09/30 1200)  Labs: Recent Labs    07/20/20 0421 07/21/20 1125 07/22/20 0903  HGB 8.0* 8.6*  --   HCT 28.9* 31.4*  --   PLT 506* 512*  --   LABPROT 24.4* 23.7* 23.1*  INR 2.3* 2.2* 2.1*  CREATININE  --  0.46  --     Estimated Creatinine Clearance: 64.5 mL/min (by C-G formula based on SCr of 0.46 mg/dL).   Assessment:  Anticoag: H/o DVT & PE (2008,2012,2014) on warfarin. -  INR 2.1. Hgb up to 8.6 after transfusion 9/27. Plts WNL - PTA regimen 7.5 mg daily except 3.75 mg on Wed (INR 1.1 on admit, compliance?)   Goal of Therapy:  INR 2-3 Monitor platelets by anticoagulation protocol: Yes   Plan:  Con't Coumadin 7.5mg  daily except 3.75mg  on Wed. Daily INR  Jaime Kelley, PharmD, BCPS Clinical Staff Pharmacist [email or web address] Alford Kelley, Jaime Kelley 07/22/2020,12:22 PM

## 2020-07-23 ENCOUNTER — Inpatient Hospital Stay (HOSPITAL_COMMUNITY): Payer: Medicare Other

## 2020-07-23 DIAGNOSIS — T17500A Unspecified foreign body in bronchus causing asphyxiation, initial encounter: Secondary | ICD-10-CM

## 2020-07-23 LAB — CBC WITH DIFFERENTIAL/PLATELET
Abs Immature Granulocytes: 0.05 10*3/uL (ref 0.00–0.07)
Basophils Absolute: 0.1 10*3/uL (ref 0.0–0.1)
Basophils Relative: 1 %
Eosinophils Absolute: 0.2 10*3/uL (ref 0.0–0.5)
Eosinophils Relative: 3 %
HCT: 29.7 % — ABNORMAL LOW (ref 36.0–46.0)
Hemoglobin: 8.4 g/dL — ABNORMAL LOW (ref 12.0–15.0)
Immature Granulocytes: 1 %
Lymphocytes Relative: 22 %
Lymphs Abs: 1.5 10*3/uL (ref 0.7–4.0)
MCH: 20.2 pg — ABNORMAL LOW (ref 26.0–34.0)
MCHC: 28.3 g/dL — ABNORMAL LOW (ref 30.0–36.0)
MCV: 71.4 fL — ABNORMAL LOW (ref 80.0–100.0)
Monocytes Absolute: 0.6 10*3/uL (ref 0.1–1.0)
Monocytes Relative: 9 %
Neutro Abs: 4.6 10*3/uL (ref 1.7–7.7)
Neutrophils Relative %: 64 %
Platelets: 300 10*3/uL (ref 150–400)
RBC: 4.16 MIL/uL (ref 3.87–5.11)
RDW: 31.7 % — ABNORMAL HIGH (ref 11.5–15.5)
WBC: 7 10*3/uL (ref 4.0–10.5)
nRBC: 0 % (ref 0.0–0.2)

## 2020-07-23 LAB — BASIC METABOLIC PANEL
Anion gap: 10 (ref 5–15)
BUN: <5 mg/dL — ABNORMAL LOW (ref 8–23)
CO2: 31 mmol/L (ref 22–32)
Calcium: 8.5 mg/dL — ABNORMAL LOW (ref 8.9–10.3)
Chloride: 91 mmol/L — ABNORMAL LOW (ref 98–111)
Creatinine, Ser: 0.48 mg/dL (ref 0.44–1.00)
GFR calc Af Amer: >60 mL/min (ref >60)
GFR calc non Af Amer: >60 mL/min (ref >60)
Glucose, Bld: 98 mg/dL (ref 70–99)
Potassium: 4.2 mmol/L (ref 3.5–5.1)
Sodium: 132 mmol/L — ABNORMAL LOW (ref 135–145)

## 2020-07-23 LAB — PROTIME-INR
INR: 1.8 — ABNORMAL HIGH (ref 0.8–1.2)
Prothrombin Time: 20 seconds — ABNORMAL HIGH (ref 11.4–15.2)

## 2020-07-23 MED ORDER — ALBUTEROL SULFATE (2.5 MG/3ML) 0.083% IN NEBU
2.5000 mg | INHALATION_SOLUTION | RESPIRATORY_TRACT | Status: DC | PRN
  Administered 2020-07-23 – 2020-07-24 (×4): 2.5 mg via RESPIRATORY_TRACT
  Filled 2020-07-23 (×5): qty 3

## 2020-07-23 MED ORDER — GUAIFENESIN ER 600 MG PO TB12
1200.0000 mg | ORAL_TABLET | Freq: Two times a day (BID) | ORAL | Status: DC
  Administered 2020-07-23 – 2020-07-27 (×9): 1200 mg via ORAL
  Filled 2020-07-23 (×9): qty 2

## 2020-07-23 MED ORDER — BUDESONIDE 0.25 MG/2ML IN SUSP
0.2500 mg | Freq: Two times a day (BID) | RESPIRATORY_TRACT | Status: DC
  Administered 2020-07-23 – 2020-07-27 (×8): 0.25 mg via RESPIRATORY_TRACT
  Filled 2020-07-23 (×8): qty 2

## 2020-07-23 MED ORDER — ENOXAPARIN SODIUM 80 MG/0.8ML ~~LOC~~ SOLN
80.0000 mg | Freq: Two times a day (BID) | SUBCUTANEOUS | Status: DC
  Administered 2020-07-23 – 2020-07-27 (×8): 80 mg via SUBCUTANEOUS
  Filled 2020-07-23 (×8): qty 0.8

## 2020-07-23 MED ORDER — ARFORMOTEROL TARTRATE 15 MCG/2ML IN NEBU
15.0000 ug | INHALATION_SOLUTION | Freq: Two times a day (BID) | RESPIRATORY_TRACT | Status: DC
  Administered 2020-07-23 – 2020-07-27 (×8): 15 ug via RESPIRATORY_TRACT
  Filled 2020-07-23 (×8): qty 2

## 2020-07-23 MED ORDER — REVEFENACIN 175 MCG/3ML IN SOLN
175.0000 ug | Freq: Every day | RESPIRATORY_TRACT | Status: DC
  Administered 2020-07-24 – 2020-07-27 (×4): 175 ug via RESPIRATORY_TRACT
  Filled 2020-07-23 (×5): qty 3

## 2020-07-23 MED ORDER — ACETYLCYSTEINE 20 % IN SOLN
3.0000 mL | Freq: Two times a day (BID) | RESPIRATORY_TRACT | Status: AC
  Administered 2020-07-23 – 2020-07-24 (×3): 3 mL via RESPIRATORY_TRACT
  Filled 2020-07-23 (×4): qty 4

## 2020-07-23 NOTE — Progress Notes (Signed)
ANTICOAGULATION CONSULT NOTE - Follow Up Consult  Pharmacy Consult for Coumadin/Lovenox Indication: h/o DVT/PE  Allergies  Allergen Reactions  . Effexor [Venlafaxine Hydrochloride] Rash    Patient Measurements: Height: 5\' 2"  (157.5 cm) Weight: 80.3 kg (177 lb) IBW/kg (Calculated) : 50.1 Heparin Dosing Weight:    Vital Signs: Temp: 98.9 F (37.2 C) (10/01 1146) Temp Source: Oral (10/01 1146) BP: 131/95 (10/01 1146) Pulse Rate: 77 (10/01 1146)  Labs: Recent Labs    07/21/20 1125 07/22/20 0903 07/23/20 0611  HGB 8.6*  --  8.4*  HCT 31.4*  --  29.7*  PLT 512*  --  300  LABPROT 23.7* 23.1* 20.0*  INR 2.2* 2.1* 1.8*  CREATININE 0.46  --  0.48    Estimated Creatinine Clearance: 65.2 mL/min (by C-G formula based on SCr of 0.48 mg/dL).   Assessment: 56 yoF with hx DVT and PE (2008, 2012, 2014) on warfarin PTA admitted with SOB. Warfarin subtherapeutic on admit and bridged with enoxaparin.  Pulm is eval to see if thoracentesis is needed. We will bridge with lovenox while the coumadin is on hold. Both Dr. Halford Chessman and Dr. Verlon Au are aware.   INR 1.8 today  Goal of Therapy:  INR 2-3 Monitor platelets by anticoagulation protocol: Yes   Plan:  Cont to hold coumadin Lovenox 80mg  SQ BID Monitor for bleeding  Onnie Boer, PharmD, BCIDP, AAHIVP, CPP Infectious Disease Pharmacist 07/23/2020 5:32 PM

## 2020-07-23 NOTE — Progress Notes (Signed)
CSW spoke with the daughter of the patient who requested a callback from the doctor and had questions about the most recent progress note stating that patient has capacity to request that MD not speak to her daughter even though pt is not capable on making decisions regarding dc.CSW advised doctor that daughter will be the one signing pt in at SNF, but this will present a problem if she is no longer included on pt's treatment plan. CSW also advised that pt's daughter lives in [place]. CSW reached out to doctor and requested clarification and requested to call daughter, MD stated he would reach out to pt's daughter later on today. CSW will continue to follow.

## 2020-07-23 NOTE — Progress Notes (Signed)
NAME:  Jaime Kelley, MRN:  [id number], DOB:  1951-01-09, LOS: 54 ADMISSION DATE:  07/10/2020, CONSULTATION DATE: 9/18 REFERRING MD:  TRH, CHIEF COMPLAINT:  SOB   Brief History   69 yo bed bound female presented to [facility] with dyspnea.  Found to have Lt lung mucus plug and lt pleural effusion.  She had bronchoscopy for removal for mucus plug, and thoracentesis and then chest tube for drainage of pleural effusion.  Chest tube removed on 9/23.  Follow up chest xray on 9/29 showed recurrent of effusion and PCCM asked to reassess.  Past Medical History  Hypothyroidism RA Discoid Lupus DM  COPD  Tobacco Abuse - former 2ppd smoker Hx PE HTN Anxiety   Significant Hospital Events   9/18 Admit  9/30 PCCM called back for recurrent effusion   Consults:    Procedures:    Significant Diagnostic Tests:   CT Chest 09/18 >> large left pleural effusion with complete collapse of left lung  Lt thoracentesis 9/19 >> protein 4.3, triglyceride 43, WBC 106(53% macrophages, 25% neutrophils, 19% lymphocytes), cytology negative  Bronchoscopy 9/20 >> thick near solid mucous plug in Lt main bronchus, no endobronchial lesions  CT chest 10/01 >>   Micro Data:  COVID 9/18 >> negative Blood 9/18 >> Staph simulans, capitis, and epidermidis  Lt pleural fluid 919 >> negative Urine 9/23 >> Proteus  Antimicrobials:    Interim history/subjective:  Has cough and chest congestion.  Feels too weak to bring up phlegm.  Denies chest pain.  Objective   Blood pressure (!) 131/95, pulse 77, temperature 98.9 F (37.2 C), temperature source Oral, resp. rate 20, height 5\' 2"  (1.575 m), weight 80.3 kg, SpO2 98 %.        Intake/Output Summary (Last 24 hours) at 07/23/2020 1437 Last data filed at 07/23/2020 1306 Gross per 24 hour  Intake 960 ml  Output 1200 ml  Net -240 ml   Filed Weights   07/19/20 0505 07/22/20 0018 07/23/20 0535  Weight: 78.5 kg 78.9 kg 80.3 kg    Examination:  General -  alert Eyes - pupils reactive ENT - no sinus tenderness, no stridor Cardiac - regular rate/rhythm, no murmur Chest - decreased BS at bases Lt > Rt, b/l rhonchi, no wheezing Abdomen - soft, non tender, + bowel sounds Extremities - no cyanosis, clubbing, or edema Skin - pressure sore on buttocks Neuro - follows commands, moves extremities  Resolved Hospital Problem list      Assessment & Plan:   Mucus plugging of Lt lung with lung collapse and sympathetic Lt pleural effusion. - will repeat CT chest w/o contrast - based on this, will determine if she needs to continue more aggressive pulmonary hygiene or if she needs repeat pleural fluid drainage - add mucinex, flutter valve, and mucomyst - mobilize as able  COPD mixed type. - add scheduled yupelri, pulmicort, brovana - prn albuterol   Acute on chronic hypoxic, hypercapnic respiratory failure. - uses 5 liters O2 at baseline - goal SpO2 88 to 95%  Hx of RA, SLE. - uncertain if this has any clinical significance with current pulmonary process, but seems less likely  Hx of DVT. - continue to hold coumadin for now in case she needs additional procedures   Labs    CMP Latest Ref Rng & Units 07/23/2020 07/21/2020 07/13/2020  Glucose 70 - 99 mg/dL 98 108(H) 89  BUN 8 - 23 mg/dL <5(L) 5(L) 9  Creatinine 0.44 - 1.00 mg/dL 0.48 0.46 0.46  Sodium 135 - 145 mmol/L 132(L) 130(L) 130(L)  Potassium 3.5 - 5.1 mmol/L 4.2 4.0 4.0  Chloride 98 - 111 mmol/L 91(L) 90(L) 91(L)  CO2 22 - 32 mmol/L 31 33(H) 30  Calcium 8.9 - 10.3 mg/dL 8.5(L) 8.5(L) 8.5(L)  Total Protein 6.5 - 8.1 g/dL - 5.9(L) -  Total Bilirubin 0.3 - 1.2 mg/dL - 0.4 -  Alkaline Phos 38 - 126 U/L - 61 -  AST 15 - 41 U/L - 16 -  ALT 0 - 44 U/L - 14 -    CBC Latest Ref Rng & Units 07/23/2020 07/21/2020 07/20/2020  WBC 4.0 - 10.5 K/uL 7.0 7.6 7.1  Hemoglobin 12.0 - 15.0 g/dL 8.4(L) 8.6(L) 8.0(L)  Hematocrit 36 - 46 % 29.7(L) 31.4(L) 28.9(L)  Platelets 150 - 400 K/uL 300 512(H)  506(H)    ABG    Component Value Date/Time   PHART 7.404 07/10/2020 2132   PCO2ART 56.5 (H) 07/10/2020 2132   PO2ART 89 07/10/2020 2132   HCO3 35.3 (H) 07/10/2020 2132   TCO2 37 (H) 07/10/2020 2132   O2SAT 97.0 07/10/2020 2132    Signature:  Chesley Mires, MD [place] Pager - [phone] 07/23/2020, 2:53 PM

## 2020-07-23 NOTE — Progress Notes (Addendum)
PROGRESS NOTE    Jaime Kelley  QZR:[id number] DOB: 17-Apr-1951 DOA: 07/10/2020 PCP: Laurey Morale, MD  Brief Narrative: [age] year old community dwelling white femaleprior smoker 1/2 ppd, COPD chronically on 5 L of oxygen at home Rheumatoid arthritis previously on methotrexate Prior history of PE previously on Coumadin HTN Admitted on 07/10/2020 substernal chest pressure without radiation 6/10 intensity Found to have hemoglobin of 6.8 in ED and large left-sided pleural effusion = complete collapse left lung requiring a nonrebreather initially Pulmonology service consulted 9/19 underwent left pleural space drainage with chest tube that was removed 07/15/2020-bronchoscopy performed 9/20 showed removal of solitary mucous plug Repeat chest x-ray showed recurrent opacification and plan is for repeat thoracentesis Patient was seen this hospital stay because patient did not have apparent capacity to make medical decisions by psychiatry  At baseline she is very deconditioned and has not been able to walk around lives with a roommate   Assessment & Plan:   Principal Problem:   Pleural effusion on left Active Problems:   Hypothyroidism   COPD GOLD III   Rheumatoid arthritis ([place])   Essential hypertension   Type 2 diabetes mellitus ([place])   Anticoagulated on Coumadin   Collapsed lung   Microcytic anemia   Chest pain   Thrombocytosis   Generalized weakness   Stage II pressure ulcer of right buttock (HCC)   Obesity (BMI 30-39.9)   Mucus plugging of bronchi   1. Large left-sided pleural effusion with collapsed lung status post chest tube removed 06/2320 and bronchoscopy 9/23 with removal of mucous plug a. Repeat chest x-ray 9/29 showed increasing consolidation throughout the left mid and lower lung fields with effusion b. Supposed to go for CT chest to delineate fluid defer to critical care c. Sending cytology and cultures as may need to rule out lung cancer d. Blood cultures grew 1 out  of 4 staph epididymis 2. COPD Gold stage II apparently on oxygen at home 5 L at baseline a. Continue DuoNeb every 6 as needed 3. Rheumatoid arthritis followed by Dr. Dossie Der previously on methotrexate a. Work-up with studies on pleural fluid as per pulmonology b. Repeat chest x-ray as needed c. Not on prior to admission steroids on prior blood cultures however stable at this time no antibiotics currently 4. Anemia with transfusion 1 unit PRBC 9/18 and 1 unit 9/27 a. Source unclear-GI was not consulted? b. Hemoccult stools and recheck as needed c. Could be secondary to anticoagulation on heparin/Coumadin d. Nursing to inform if any bleeding 5. HTN a. Continue amlodipine 5+ losartan 100 6. Thrombocytosis a. Resolved without issue-monitor 7. DM TY 2-prior to admission on Metformin 500 twice daily a. Sugars ranging 90s to 100s eating about 75% of meals b. Holding hypoglycemic agents at this time 8. Hypothyroidism a. Continue Synthroid 135 mcg daily 9. Sacral decubitus stage I 10. Previous DVTs on Coumadin a. Continues on heparin at this time-transition back to Coumadin when all procedures are done  DVT prophylaxis: IV heparin Code Status: Full Family Communication: updated daughter on telephone in detail  Status is: Inpatient  Remains inpatient appropriate because:Persistent severe electrolyte disturbances and Ongoing diagnostic testing needed not appropriate for outpatient work up   Dispo: The patient is from: Home              Anticipated d/c is to: SNF              Anticipated d/c date is: 3 days  Patient currently is not medically stable to d/c.   Consultants:   Pulmonology  Procedures: None  Antimicrobials: None awake alert coherent on 2 L of oxygen Looks stable pleasant no distress    Subjective: Tells me he has some cough and occasional sputum No fever no chills no dark stools reported.  Objective: Vitals:   07/22/20 1639 07/22/20 1953 07/23/20  0337 07/23/20 0535  BP: (!) 97/58 (!) 112/58  128/77  Pulse: 76 74  72  Resp: 20 19  19   Temp: 98.4 F (36.9 C) 98.3 F (36.8 C)  98.5 F (36.9 C)  TempSrc: Oral Oral  Oral  SpO2: 100% 97% 95% 97%  Weight:    80.3 kg  Height:        Intake/Output Summary (Last 24 hours) at 07/23/2020 0856 Last data filed at 07/23/2020 0500 Gross per 24 hour  Intake 240 ml  Output 1500 ml  Net -1260 ml   Filed Weights   07/19/20 0505 07/22/20 0018 07/23/20 0535  Weight: 78.5 kg 78.9 kg 80.3 kg    Examination:  General exam: EOMI NCAT no focal deficit looks about stated age on oxygen Respiratory system: Clear anteriorly  with decreased lung sounds posterolaterally on the left side with dull percussion note Cardiovascular system: S1-S2 no murmur rub or gallop Gastrointestinal system: Obese soft nontender no rebound no guarding. Central nervous system: Neurologically intact no focal deficit moving all 4 limbs equally Extremities: No lower extremity edema ROM intact Skin: As above Psychiatry: Flat  Data Reviewed: I have personally reviewed following labs and imaging studies Sodium up from 134 132 Chloride 91 BUN/creatinine 5/0.4 WBC 7.0 Hemoglobin 8.4 Platelet 300  Radiology Studies: DG Chest Port 1 View  Result Date: 07/21/2020 CLINICAL DATA:  Shortness of breath EXAM: PORTABLE CHEST 1 VIEW COMPARISON:  July 15, 2020 FINDINGS: There is consolidation throughout the left mid and lower lung regions with questionable superimposed left pleural effusion. The right lung is clear. Heart is upper normal in size with pulmonary vascularity normal. No adenopathy. There is aortic atherosclerosis. No bone lesions. IMPRESSION: Consolidation throughout much of the left mid and lower lung regions with suspected small pleural effusions superimposed on the left. Right lung clear. Heart upper normal in size. No adenopathy. Aortic Atherosclerosis (ICD10-I70.0). Electronically Signed   By: Lowella Grip  III M.D.   On: 07/21/2020 14:53     Scheduled Meds: . amLODipine  5 mg Oral QHS  . feeding supplement (GLUCERNA SHAKE)  237 mL Oral TID BM  . levothyroxine  137 mcg Oral Q0600  . losartan  100 mg Oral QHS  . multivitamin with minerals  1 tablet Oral Daily  . sertraline  100 mg Oral QHS   Continuous Infusions:   LOS: 13 days    Time spent: 25  Nita Sells, MD [facility] To contact the attending provider between 7A-7P or the covering provider during after hours 7P-7A, please log into the web site [email or web address] and access using universal [place] password for that web site. If you do not have the password, please call the hospital operator.  07/23/2020, 8:56 AM

## 2020-07-24 ENCOUNTER — Inpatient Hospital Stay (HOSPITAL_COMMUNITY): Payer: Medicare Other

## 2020-07-24 LAB — CBC WITH DIFFERENTIAL/PLATELET
Abs Immature Granulocytes: 0.04 10*3/uL (ref 0.00–0.07)
Basophils Absolute: 0.1 10*3/uL (ref 0.0–0.1)
Basophils Relative: 1 %
Eosinophils Absolute: 0.2 10*3/uL (ref 0.0–0.5)
Eosinophils Relative: 3 %
HCT: 31.8 % — ABNORMAL LOW (ref 36.0–46.0)
Hemoglobin: 8.8 g/dL — ABNORMAL LOW (ref 12.0–15.0)
Immature Granulocytes: 1 %
Lymphocytes Relative: 22 %
Lymphs Abs: 1.8 10*3/uL (ref 0.7–4.0)
MCH: 19.8 pg — ABNORMAL LOW (ref 26.0–34.0)
MCHC: 27.7 g/dL — ABNORMAL LOW (ref 30.0–36.0)
MCV: 71.6 fL — ABNORMAL LOW (ref 80.0–100.0)
Monocytes Absolute: 0.7 10*3/uL (ref 0.1–1.0)
Monocytes Relative: 9 %
Neutro Abs: 5.2 10*3/uL (ref 1.7–7.7)
Neutrophils Relative %: 64 %
Platelets: 481 10*3/uL — ABNORMAL HIGH (ref 150–400)
RBC: 4.44 MIL/uL (ref 3.87–5.11)
RDW: 32.5 % — ABNORMAL HIGH (ref 11.5–15.5)
WBC: 8.1 10*3/uL (ref 4.0–10.5)
nRBC: 0 % (ref 0.0–0.2)

## 2020-07-24 LAB — COMPREHENSIVE METABOLIC PANEL
ALT: 16 U/L (ref 0–44)
AST: 17 U/L (ref 15–41)
Albumin: 2.8 g/dL — ABNORMAL LOW (ref 3.5–5.0)
Alkaline Phosphatase: 58 U/L (ref 38–126)
Anion gap: 8 (ref 5–15)
BUN: <5 mg/dL — ABNORMAL LOW (ref 8–23)
CO2: 31 mmol/L (ref 22–32)
Calcium: 8.4 mg/dL — ABNORMAL LOW (ref 8.9–10.3)
Chloride: 92 mmol/L — ABNORMAL LOW (ref 98–111)
Creatinine, Ser: 0.47 mg/dL (ref 0.44–1.00)
GFR calc Af Amer: >60 mL/min (ref >60)
GFR calc non Af Amer: >60 mL/min (ref >60)
Glucose, Bld: 102 mg/dL — ABNORMAL HIGH (ref 70–99)
Potassium: 3.9 mmol/L (ref 3.5–5.1)
Sodium: 131 mmol/L — ABNORMAL LOW (ref 135–145)
Total Bilirubin: 0.4 mg/dL (ref 0.3–1.2)
Total Protein: 6 g/dL — ABNORMAL LOW (ref 6.5–8.1)

## 2020-07-24 LAB — BODY FLUID CELL COUNT WITH DIFFERENTIAL
Eos, Fluid: 54 %
Lymphs, Fluid: 33 %
Monocyte-Macrophage-Serous Fluid: 12 % — ABNORMAL LOW (ref 50–90)
Neutrophil Count, Fluid: 1 % (ref 0–25)
Total Nucleated Cell Count, Fluid: 577 cu mm (ref 0–1000)

## 2020-07-24 LAB — GLUCOSE, PLEURAL OR PERITONEAL FLUID: Glucose, Fluid: 120 mg/dL

## 2020-07-24 LAB — PROTEIN, TOTAL: Total Protein: 5.7 g/dL — ABNORMAL LOW (ref 6.5–8.1)

## 2020-07-24 LAB — LACTATE DEHYDROGENASE, PLEURAL OR PERITONEAL FLUID: LD, Fluid: 117 U/L — ABNORMAL HIGH (ref 3–23)

## 2020-07-24 LAB — LACTATE DEHYDROGENASE: LDH: 174 U/L (ref 98–192)

## 2020-07-24 LAB — PROTEIN, PLEURAL OR PERITONEAL FLUID: Total protein, fluid: 3.5 g/dL

## 2020-07-24 LAB — PROTIME-INR
INR: 1.3 — ABNORMAL HIGH (ref 0.8–1.2)
Prothrombin Time: 15.9 seconds — ABNORMAL HIGH (ref 11.4–15.2)

## 2020-07-24 LAB — SEDIMENTATION RATE: Sed Rate: 18 mm/hr (ref 0–22)

## 2020-07-24 MED ORDER — WARFARIN SODIUM 7.5 MG PO TABS
7.5000 mg | ORAL_TABLET | Freq: Once | ORAL | Status: AC
  Administered 2020-07-24: 7.5 mg via ORAL
  Filled 2020-07-24: qty 1

## 2020-07-24 MED ORDER — WARFARIN - PHARMACIST DOSING INPATIENT: Freq: Every day | Status: DC

## 2020-07-24 NOTE — Progress Notes (Signed)
NAME:  Jaime Kelley, MRN:  [id number], DOB:  17-Oct-1951, LOS: 39 ADMISSION DATE:  07/10/2020, CONSULTATION DATE: 9/18 REFERRING MD:  TRH, CHIEF COMPLAINT:  SOB   Brief History   69 yo bed bound female presented to [facility] with dyspnea.  Found to have Lt lung mucus plug and lt pleural effusion.  She had bronchoscopy for removal for mucus plug, and thoracentesis and then chest tube for drainage of pleural effusion.  Chest tube removed on 9/23.  Follow up chest xray on 9/29 showed recurrent of effusion and PCCM asked to reassess.  Past Medical History  Hypothyroidism RA Discoid Lupus DM  COPD  Tobacco Abuse - former 2ppd smoker Hx PE HTN Anxiety   Significant Hospital Events   9/18 Admit  9/30 PCCM called back for recurrent effusion   Consults:    Procedures:    Significant Diagnostic Tests:   CT Chest 09/18 >> large left pleural effusion with complete collapse of left lung  Lt thoracentesis 9/19 >> protein 4.3, triglyceride 43, WBC 106(53% macrophages, 25% neutrophils, 19% lymphocytes), cytology negative  Bronchoscopy 9/20 >> thick near solid mucous plug in Lt main bronchus, no endobronchial lesions  CT chest 10/01 >> moderate L pleural effusion, 8 mm GGO in the RUL, asymmetrically prominent though non-pathologically enlarged left axillary and mediastinal lymph nodes, emphysema  Micro Data:  COVID 9/18 >> negative Blood 9/18 >> Staph simulans, capitis, and epidermidis  Lt pleural fluid 919 >> negative Urine 9/23 >> Proteus  Antimicrobials:    Interim history/subjective:  Pt reports her bottom is sore from lying in bed Notes she quit smoking when they told her she had COPD At baseline O2  Objective   Blood pressure 136/67, pulse 76, temperature 98.3 F (36.8 C), temperature source Oral, resp. rate 20, height '5\' 2"'  (1.575 m), weight 80.3 kg, SpO2 95 %.        Intake/Output Summary (Last 24 hours) at 07/24/2020 1147 Last data filed at 07/23/2020 2045 Gross per 24  hour  Intake 720 ml  Output 900 ml  Net -180 ml   Filed Weights   07/19/20 0505 07/22/20 0018 07/23/20 0535  Weight: 78.5 kg 78.9 kg 80.3 kg    Examination: General: adult female lying in bed in NAD HEENT: MM pink/moist, no jvd, edentulous, wearing glasses, anicteric  Neuro: AAOx4, speech clear, MAE CV: s1s2 rrr, no m/r/g PULM:  Purse-lip breathing, prolonged exp phase, no accessory muscle use, decreased breath sounds on right, rhonchi anterior, pt pulled thick bloody dried secretion from nose during exam  GI: soft, bsx4 active  Extremities: warm/dry, no edema, RA changes in hands noted  Skin: no rashes or lesions  Resolved Hospital Problem list      Assessment & Plan:   Mucus plugging of Lt lung with lung collapse and sympathetic Lt pleural effusion. -thoracentesis at bedside 10/2 -send pleural fluid for repeat assessment including LDH, cytology, cell count, cholesterol, protein, glucose, ph -pulmonary hygiene-IS, mobilize  -assess ESR -continue mucinex, flutter valve, mucomyst  -follow up CXR post thoracentesis   COPD mixed type. Thereasa Parkin, Pulmicort, Brovana  -PRN albuterol    Acute on chronic hypoxic, hypercapnic respiratory failure. 5L baseline at home  -wean O2 for sats 88-95%  Hx of RA, SLE. -doubt clinical significance at this point but send LDH, glucose, pH on pleural fluid   Hx of DVT. -await CXR, will consider restarting coumadin 10/2 post review   Labs    CMP Latest Ref Rng & Units 07/24/2020 07/23/2020  07/21/2020  Glucose 70 - 99 mg/dL 102(H) 98 108(H)  BUN 8 - 23 mg/dL <5(L) <5(L) 5(L)  Creatinine 0.44 - 1.00 mg/dL 0.47 0.48 0.46  Sodium 135 - 145 mmol/L 131(L) 132(L) 130(L)  Potassium 3.5 - 5.1 mmol/L 3.9 4.2 4.0  Chloride 98 - 111 mmol/L 92(L) 91(L) 90(L)  CO2 22 - 32 mmol/L 31 31 33(H)  Calcium 8.9 - 10.3 mg/dL 8.4(L) 8.5(L) 8.5(L)  Total Protein 6.5 - 8.1 g/dL 6.0(L) - 5.9(L)  Total Bilirubin 0.3 - 1.2 mg/dL 0.4 - 0.4  Alkaline Phos 38 - 126  U/L 58 - 61  AST 15 - 41 U/L 17 - 16  ALT 0 - 44 U/L 16 - 14    CBC Latest Ref Rng & Units 07/24/2020 07/23/2020 07/21/2020  WBC 4.0 - 10.5 K/uL 8.1 7.0 7.6  Hemoglobin 12.0 - 15.0 g/dL 8.8(L) 8.4(L) 8.6(L)  Hematocrit 36 - 46 % 31.8(L) 29.7(L) 31.4(L)  Platelets 150 - 400 K/uL 481(H) 300 512(H)    ABG    Component Value Date/Time   PHART 7.404 07/10/2020 2132   PCO2ART 56.5 (H) 07/10/2020 2132   PO2ART 89 07/10/2020 2132   HCO3 35.3 (H) 07/10/2020 2132   TCO2 37 (H) 07/10/2020 2132   O2SAT 97.0 07/10/2020 2132    Signature:    Noe Gens, MSN, NP-C, AGACNP-BC [facility] 07/24/2020, 11:56 AM   Please see [email or web address] for pager details.

## 2020-07-24 NOTE — Progress Notes (Signed)
PROGRESS NOTE  Jaime Kelley AST:[id number] DOB: 18-Feb-1951 DOA: 07/10/2020 PCP: Jaime Morale, MD  Brief History   69 year old community dwelling white female who is a prior smoker of 1/2 ppd. She carries a past medical history significant for  COPD chronically on 5 L of oxygen at home. She has rheumatoid arthritis previously on methotrexate. She also has a prior history of PE and was previously on Coumadin. She also has hypertension.   The patient presented on 07/10/2020 with complaints of substernal chest pressure without radiation. She gave it a 6/10 in terms of intensity. She was found to have hemoglobin of 6.8 in ED as well as a large left-sided pleural effusion with complete collapse left lung requiring a nonrebreather initially. Pulmonology service was consulted on 9/19/202. The patient underwent underwent left pleural space drainage with chest tube that was removed on 07/15/2020. A bronchoscopy performed 9/20 showed removal of solitary mucous plug. Repeat chest x-ray showed recurrent opacification. Pulmonology performed a repeat thoracentesis at bedside on 07/24/2020.   The patient has been evaluated by psychiatry who determined that she does not have capacity to make medical decisions.   At baseline she is very deconditioned and has not been able to walk around at home. The patient states that this is due to knee pain. She has a home health aide who comes to her house once a week. The patient lives with a roommate who assists her around the house.  Consultants  . Pulmonology  Procedures  . Thoracentesis x 2 . Chest tube placement and removal . Bronchoscopy  Antibiotics   Anti-infectives (From admission, onward)   None    .  Subjective  The patient is resting comfortably. No new complaints. She has advised me that she "does not get up out of bed".  Objective   Vitals:  Vitals:   07/24/20 0601 07/24/20 0800  BP: 136/67   Pulse: 76   Resp: 20   Temp: 98.3 F (36.8 C)    SpO2: 96% 95%   Exam:  Constitutional:  . The patient is awake and alert. No acute distress. Respiratory:  . No increased work of breathing. . Breath sounds are diminished on the left base. Marland Kitchen No wheezes, rales, or rhonchi . No tactile fremitus Cardiovascular:  . Regular rate and rhythm . No murmurs, ectopy, or gallups. . No lateral PMI. No thrills. Abdomen:  . Abdomen is soft, non-tender, non-distended . No hernias, masses, or organomegaly . Normoactive bowel sounds.  Musculoskeletal:  . No cyanosis, clubbing, or edema Skin:  . No rashes, lesions, ulcers . palpation of skin: no induration or nodules Neurologic:  . CN 2-12 intact . Sensation all 4 extremities intact  I have personally reviewed the following:   Today's Data  . Vitals, CMP, CBC  Scheduled Meds: . acetylcysteine  3 mL Nebulization BID  . amLODipine  5 mg Oral QHS  . arformoterol  15 mcg Nebulization BID  . budesonide (PULMICORT) nebulizer solution  0.25 mg Nebulization BID  . enoxaparin (LOVENOX) injection  80 mg Subcutaneous BID  . feeding supplement (GLUCERNA SHAKE)  237 mL Oral TID BM  . guaiFENesin  1,200 mg Oral BID  . levothyroxine  137 mcg Oral Q0600  . losartan  100 mg Oral QHS  . multivitamin with minerals  1 tablet Oral Daily  . revefenacin  175 mcg Nebulization Daily  . sertraline  100 mg Oral QHS   Continuous Infusions:  Principal Problem:   Pleural effusion on left Active  Problems:   Hypothyroidism   COPD GOLD III   Rheumatoid arthritis (Jaime Kelley)   Essential hypertension   Type 2 diabetes mellitus ([facility])   Anticoagulated on Coumadin   Collapsed lung   Microcytic anemia   Chest pain   Thrombocytosis   Generalized weakness   Stage II pressure ulcer of right buttock ([facility])   Obesity (BMI 30-39.9)   Mucus plugging of bronchi   LOS: 14 days    A & P  Large left-sided pleural effusion with collapsed lung status post chest tube removed 06/2320 and bronchoscopy 9/23 with removal of  mucous plug a. Repeat chest x-ray 9/29 showed increasing consolidation throughout the left mid and lower lung fields with effusion b. S/P bedside thoracentesis today by pulmonology c. Sending cytology and cultures as may need to rule out lung cancer d. Blood cultures grew 1 out of 4 Jaime Kelley COPD Gold stage II apparently on oxygen at home 5 L at baseline a. Continue DuoNeb every 6 as needed Rheumatoid arthritis followed by Dr. Dossie Kelley previously on methotrexate a. Work-up with studies on pleural fluid as per pulmonology b. Repeat chest x-ray as needed c. Not on prior to admission steroids on prior blood cultures however stable at this time no antibiotics currently Anemia with transfusion 1 unit PRBC 9/18 and 1 unit 9/27 a. Source unclear-GI was not consulted? b. Hemoccult stools and recheck as needed c. Could be secondary to anticoagulation on heparin/Coumadin d. Nursing to inform if any bleeding HTN a. Continue amlodipine 5+ losartan 100 Thrombocytosis a. Resolved without issue-monitor DM TY 2-prior to admission on Metformin 500 twice daily a. Sugars ranging 90s to 100s eating about 75% of meals b. Holding hypoglycemic agents at this time Hypothyroidism a. Continue Synthroid 135 mcg daily Sacral decubitus stage I Previous DVTs on Coumadin a. Continues on heparin at this time-transition back to Coumadin when all procedures are done  DVT prophylaxis: IV heparin Code Status: Full Family Communication: updated daughter on telephone in detail  Status is: Inpatient  Remains inpatient appropriate because:Persistent severe electrolyte disturbances and Ongoing diagnostic testing needed not appropriate for outpatient work up  Dispo: The patient is from: Home  Anticipated d/c is to: SNF  Anticipated d/c date is: 3 days  Patient currently is not medically stable to d/c.  Jaime Hyden, DO [facility] Direct contact: see [email or web address]    7PM-7AM contact night coverage as above 07/24/2020, 2:19 PM  LOS: 14 days

## 2020-07-24 NOTE — Procedures (Signed)
Thoracentesis  Procedure Note  AMARISS DETAMORE  [id number]  1951-10-11  Date:07/24/20  Time:11:42 AM   Provider Performing:Venora Kautzman Alfredo Martinez   Procedure: Thoracentesis with imaging guidance (09323)  Indication(s) Pleural Effusion  Consent Risks of the procedure as well as the alternatives and risks of each were explained to the patient and/or caregiver.  Consent for the procedure was obtained and is signed in the bedside chart  Anesthesia Topical only with 1% lidocaine    Time Out Verified patient identification, verified procedure, site/side was marked, verified correct patient position, special equipment/implants available, medications/allergies/relevant history reviewed, required imaging and test results available.   Sterile Technique Maximal sterile technique including full sterile barrier drape, hand hygiene, sterile gown, sterile gloves, mask, hair covering, sterile ultrasound probe cover (if used).  Procedure Description Ultrasound was used to identify appropriate pleural anatomy for placement and overlying skin marked.  Area of drainage cleaned and draped in sterile fashion. Lidocaine was used to anesthetize the skin and subcutaneous tissue.  1,000 cc's of clear yellow appearing fluid was drained from the left pleural space. Catheter then removed and bandaid applied to site.   Complications/Tolerance None; patient tolerated the procedure well. Chest X-ray is ordered to confirm no post-procedural complication.   EBL Minimal   Specimen(s) Pleural fluid   Noe Gens, MSN, NP-C, AGACNP-BC [facility] 07/24/2020, 11:43 AM   Please see [email or web address] for pager details.

## 2020-07-24 NOTE — Progress Notes (Signed)
ANTICOAGULATION CONSULT NOTE - Follow Up Consult  Pharmacy Consult for Coumadin/Lovenox Indication: h/o DVT/PE  Allergies  Allergen Reactions  . Effexor [Venlafaxine Hydrochloride] Rash    Patient Measurements: Height: 5\' 2"  (157.5 cm) Weight: 80.3 kg (177 lb) IBW/kg (Calculated) : 50.1 Heparin Dosing Weight:    Vital Signs: Temp: 98.3 F (36.8 C) (10/02 0601) Temp Source: Oral (10/02 0601) BP: 136/67 (10/02 0601) Pulse Rate: 76 (10/02 0601)  Labs: Recent Labs    07/22/20 0903 07/23/20 0611 07/24/20 0812  HGB  --  8.4* 8.8*  HCT  --  29.7* 31.8*  PLT  --  300 481*  LABPROT 23.1* 20.0* 15.9*  INR 2.1* 1.8* 1.3*  CREATININE  --  0.48 0.47    Estimated Creatinine Clearance: 65.2 mL/min (by C-G formula based on SCr of 0.47 mg/dL).   Assessment: 36 yoF with hx DVT and PE (2008, 2012, 2014) on warfarin PTA admitted with SOB. Warfarin subtherapeutic on admit and bridged with enoxaparin.  Pulm thoracentesis 10/2 bridged with LMWH.    Restarting warfarin per MD. INR 1.3 today is subtherapeutic. Patient previously theraputic on in-hospital regimen, 3.75 mg on Wednesday evening, 7.5 mg on all other evenings of the week. CBC stable, no bleeding noted.   Goal of Therapy:  INR 2-3 Monitor platelets by anticoagulation protocol: Yes   Plan:  Coumadin 7.5mg  x1 Lovenox 80mg  SQ BID - continue until 2 therapeutic INRs Monitor CBCs, bleeding  Norina Buzzard, PharmD PGY1 Pharmacy Resident 07/24/2020 2:53 PM

## 2020-07-25 LAB — COMPREHENSIVE METABOLIC PANEL
ALT: 16 U/L (ref 0–44)
AST: 18 U/L (ref 15–41)
Albumin: 2.6 g/dL — ABNORMAL LOW (ref 3.5–5.0)
Alkaline Phosphatase: 58 U/L (ref 38–126)
Anion gap: 10 (ref 5–15)
BUN: 7 mg/dL — ABNORMAL LOW (ref 8–23)
CO2: 27 mmol/L (ref 22–32)
Calcium: 8.3 mg/dL — ABNORMAL LOW (ref 8.9–10.3)
Chloride: 94 mmol/L — ABNORMAL LOW (ref 98–111)
Creatinine, Ser: 0.48 mg/dL (ref 0.44–1.00)
GFR calc Af Amer: >60 mL/min (ref >60)
GFR calc non Af Amer: >60 mL/min (ref >60)
Glucose, Bld: 105 mg/dL — ABNORMAL HIGH (ref 70–99)
Potassium: 4.1 mmol/L (ref 3.5–5.1)
Sodium: 131 mmol/L — ABNORMAL LOW (ref 135–145)
Total Bilirubin: 0.3 mg/dL (ref 0.3–1.2)
Total Protein: 5.6 g/dL — ABNORMAL LOW (ref 6.5–8.1)

## 2020-07-25 LAB — CBC WITH DIFFERENTIAL/PLATELET
Abs Immature Granulocytes: 0 10*3/uL (ref 0.00–0.07)
Basophils Absolute: 0 10*3/uL (ref 0.0–0.1)
Basophils Relative: 0 %
Eosinophils Absolute: 0.3 10*3/uL (ref 0.0–0.5)
Eosinophils Relative: 3 %
HCT: 30.6 % — ABNORMAL LOW (ref 36.0–46.0)
Hemoglobin: 8.5 g/dL — ABNORMAL LOW (ref 12.0–15.0)
Lymphocytes Relative: 10 %
Lymphs Abs: 1.1 10*3/uL (ref 0.7–4.0)
MCH: 20.3 pg — ABNORMAL LOW (ref 26.0–34.0)
MCHC: 27.8 g/dL — ABNORMAL LOW (ref 30.0–36.0)
MCV: 73.2 fL — ABNORMAL LOW (ref 80.0–100.0)
Monocytes Absolute: 0.1 10*3/uL (ref 0.1–1.0)
Monocytes Relative: 1 %
Neutro Abs: 9 10*3/uL — ABNORMAL HIGH (ref 1.7–7.7)
Neutrophils Relative %: 86 %
Platelets: 367 10*3/uL (ref 150–400)
RBC: 4.18 MIL/uL (ref 3.87–5.11)
RDW: 33.4 % — ABNORMAL HIGH (ref 11.5–15.5)
WBC: 10.5 10*3/uL (ref 4.0–10.5)
nRBC: 0 % (ref 0.0–0.2)
nRBC: 0 /100 WBC

## 2020-07-25 LAB — PROTIME-INR
INR: 1.3 — ABNORMAL HIGH (ref 0.8–1.2)
Prothrombin Time: 15.6 seconds — ABNORMAL HIGH (ref 11.4–15.2)

## 2020-07-25 MED ORDER — WARFARIN SODIUM 7.5 MG PO TABS
7.5000 mg | ORAL_TABLET | Freq: Once | ORAL | Status: AC
  Administered 2020-07-25: 7.5 mg via ORAL
  Filled 2020-07-25: qty 1

## 2020-07-25 NOTE — Progress Notes (Signed)
NAME:  Jaime Kelley, MRN:  [id number], DOB:  1950-12-23, LOS: 21 ADMISSION DATE:  07/10/2020, CONSULTATION DATE: 9/18 REFERRING MD:  TRH, CHIEF COMPLAINT:  SOB   Brief History   70 yo bed bound female presented to [facility] with dyspnea.  Found to have Lt lung mucus plug and lt pleural effusion.  She had bronchoscopy for removal for mucus plug, and thoracentesis and then chest tube for drainage of pleural effusion.  Chest tube removed on 9/23.  Follow up chest xray on 9/29 showed recurrent of effusion and PCCM asked to reassess.  Past Medical History  Hypothyroidism RA Discoid Lupus DM  COPD  Tobacco Abuse - former 2ppd smoker Hx PE HTN Anxiety   Significant Hospital Events   9/18 Admit  9/19 thoracentesis 9/20 bronchoscopy 9/30 PCCM called back for recurrent effusion  10/02 repeat thoracentesis  Consults:    Procedures:    Significant Diagnostic Tests:   CT Chest 09/18 >> large left pleural effusion with complete collapse of left lung  Lt thoracentesis 9/19 >> protein 4.3, triglyceride 43, WBC 106(53% macrophages, 25% neutrophils, 19% lymphocytes, 3% eosinophils), cytology negative  Bronchoscopy 9/20 >> thick near solid mucous plug in Lt main bronchus, no endobronchial lesions  CT chest 10/01 >> moderate L pleural effusion, 8 mm GGO in the RUL, asymmetrically prominent though non-pathologically enlarged left axillary and mediastinal lymph nodes, emphysema  Lt thoracentesis 10/02 >> 1000 ml yellow fluid, glucose 120, protein 3.5, LDH 117, WBC 577 (12% macrophages, 1% neutrophils, 33% lymphocytes, 54% eosinophils)  Micro Data:  COVID 9/18 >> negative Blood 9/18 >> Staph simulans, capitis, and epidermidis  Lt pleural fluid 9/19 >> negative Urine 9/23 >> Proteus Lt pleural fluid 10/2 >>  Antimicrobials:    Interim history/subjective:  Breathing improved after thoracentesis 10/2.  Had back and hip pains this AM >> received norco and more sleepy after.  Objective     Blood pressure 110/68, pulse 79, temperature 97.9 F (36.6 C), temperature source Oral, resp. rate 18, height 5\' 2"  (1.575 m), weight 77.6 kg, SpO2 95 %.        Intake/Output Summary (Last 24 hours) at 07/25/2020 1141 Last data filed at 07/25/2020 6222 Gross per 24 hour  Intake 240 ml  Output 600 ml  Net -360 ml   Filed Weights   07/22/20 0018 07/23/20 0535 07/25/20 0438  Weight: 78.9 kg 80.3 kg 77.6 kg    Examination:  General - sleepy Eyes - pupils reactive ENT - no sinus tenderness, no stridor Cardiac - regular rate/rhythm, no murmur Chest - scattered rhonchi, no wheeze Abdomen - soft, non tender, + bowel sounds Extremities - no cyanosis, clubbing, or edema Skin - no rashes Neuro - follows commands  Resolved Hospital Problem list   Mucus plugging of Lt main bronchus  Assessment & Plan:   Exudate recurrent Lt pleural effusion. - repeat thoracentesis from 10/02 shows 577 WBC with 54% eosinophils >> suspect this is related to previous thoracentesis and benign finding - repeat 2 view CXR on 10/04 - if pleural fluid recurring then finding of increased eosinophils could have more significance - f/u pleural fluid culture and cytology from 10/02  COPD mixed type. - continue yupelri, pulmicort, brovana - prn albuterol   Acute on chronic hypoxic, hypercapnic respiratory failure. - uses 5 liters oxygen at baseline - goal SpO2 88 to 95%  Hx of RA, SLE. - per primary team  Hx of DVT. - continue coumadin for now; however if she has recurrence  of pleural effusion with eosinophilia then ?if coumadin could be a culprit   Updated pt's daughter at bedside.  She will be returning to [place] on 10/04, but has requested that she remain family contact.   Labs    CMP Latest Ref Rng & Units 07/25/2020 07/24/2020 07/24/2020  Glucose 70 - 99 mg/dL 105(H) - 102(H)  BUN 8 - 23 mg/dL 7(L) - <5(L)  Creatinine 0.44 - 1.00 mg/dL 0.48 - 0.47  Sodium 135 - 145 mmol/L 131(L) - 131(L)   Potassium 3.5 - 5.1 mmol/L 4.1 - 3.9  Chloride 98 - 111 mmol/L 94(L) - 92(L)  CO2 22 - 32 mmol/L 27 - 31  Calcium 8.9 - 10.3 mg/dL 8.3(L) - 8.4(L)  Total Protein 6.5 - 8.1 g/dL 5.6(L) 5.7(L) 6.0(L)  Total Bilirubin 0.3 - 1.2 mg/dL 0.3 - 0.4  Alkaline Phos 38 - 126 U/L 58 - 58  AST 15 - 41 U/L 18 - 17  ALT 0 - 44 U/L 16 - 16    CBC Latest Ref Rng & Units 07/25/2020 07/24/2020 07/23/2020  WBC 4.0 - 10.5 K/uL 10.5 8.1 7.0  Hemoglobin 12.0 - 15.0 g/dL 8.5(L) 8.8(L) 8.4(L)  Hematocrit 36 - 46 % 30.6(L) 31.8(L) 29.7(L)  Platelets 150 - 400 K/uL 367 481(H) 300    ABG    Component Value Date/Time   PHART 7.404 07/10/2020 2132   PCO2ART 56.5 (H) 07/10/2020 2132   PO2ART 89 07/10/2020 2132   HCO3 35.3 (H) 07/10/2020 2132   TCO2 37 (H) 07/10/2020 2132   O2SAT 97.0 07/10/2020 2132    Signature:  Chesley Mires, MD [facility] Pager - [phone] 07/25/2020, 11:49 AM

## 2020-07-25 NOTE — Progress Notes (Signed)
ANTICOAGULATION CONSULT NOTE - Follow Up Consult  Pharmacy Consult for Coumadin/Lovenox Indication: h/o DVT/PE  Allergies  Allergen Reactions  . Effexor [Venlafaxine Hydrochloride] Rash    Patient Measurements: Height: 5\' 2"  (157.5 cm) Weight: 77.6 kg (171 lb) IBW/kg (Calculated) : 50.1 Heparin Dosing Weight:    Vital Signs: Temp: 97.9 F (36.6 C) (10/03 0443) Temp Source: Oral (10/03 0443) BP: 110/68 (10/03 0443) Pulse Rate: 79 (10/03 0443)  Labs: Recent Labs    07/23/20 0611 07/23/20 0611 07/24/20 0812 07/25/20 0335  HGB 8.4*   < > 8.8* 8.5*  HCT 29.7*  --  31.8* 30.6*  PLT 300  --  481* 367  LABPROT 20.0*  --  15.9* 15.6*  INR 1.8*  --  1.3* 1.3*  CREATININE 0.48  --  0.47 0.48   < > = values in this interval not displayed.    Estimated Creatinine Clearance: 64 mL/min (by C-G formula based on SCr of 0.48 mg/dL).   Assessment: 17 yoF with hx DVT and PE (2008, 2012, 2014) on warfarin PTA admitted with SOB. Warfarin subtherapeutic on admit (question of compliance).  Pulm thoracentesis 10/2 bridged with LMWH.    Restarting warfarin per MD. INR 1.3 today is subtherapeutic. Patient previously theraputic on in-hospital regimen, 3.75 mg on Wednesday evening, 7.5 mg on all other evenings of the week. CBC stable, no bleeding noted.   Goal of Therapy:  INR 2-3 Monitor platelets by anticoagulation protocol: Yes   Plan:  Coumadin 7.5mg  x1 Lovenox 80mg  SQ BID - continue until 2 therapeutic INRs Monitor CBCs, bleeding  Norina Buzzard, PharmD PGY1 Pharmacy Resident 07/25/2020 7:34 AM

## 2020-07-25 NOTE — Significant Event (Signed)
Patient urine is very foul smelling and dark Amber. Water is encouraged but has been non com-plaint with less concentrated fluids.   Patient was given a bath By NT and remains foul smelling. Sacral foam changed.

## 2020-07-25 NOTE — Progress Notes (Signed)
PROGRESS NOTE  Jaime Kelley TMA:[id number] DOB: 1950/10/29 DOA: 07/10/2020 PCP: Laurey Morale, MD  Brief History   69 year old community dwelling white female who is a prior smoker of 1/2 ppd. She carries a past medical history significant for  COPD chronically on 5 L of oxygen at home. She has rheumatoid arthritis previously on methotrexate. She also has a prior history of PE and was previously on Coumadin. She also has hypertension.   The patient presented on 07/10/2020 with complaints of substernal chest pressure without radiation. She gave it a 6/10 in terms of intensity. She was found to have hemoglobin of 6.8 in ED as well as a large left-sided pleural effusion with complete collapse left lung requiring a nonrebreather initially. Pulmonology service was consulted on 9/19/202. The patient underwent underwent left pleural space drainage with chest tube that was removed on 07/15/2020. A bronchoscopy performed 9/20 showed removal of solitary mucous plug. Repeat chest x-ray showed recurrent opacification. Pulmonology performed a repeat thoracentesis at bedside on 07/24/2020. Studies on this fluid are still pending, but it does not appear to be malignant or infected.  The patient has been evaluated by psychiatry who determined that she does not have capacity to make medical decisions.   At baseline she is very deconditioned and has not been able to walk around at home. The patient states that this is due to knee pain. She has a home health aide who comes to her house once a week. The patient lives with a roommate who assists her around the house.  Consultants  . Pulmonology  Procedures  . Thoracentesis x 2 . Chest tube placement and removal . Bronchoscopy  Antibiotics   Anti-infectives (From admission, onward)   None     Subjective  The patient is sleeping. Rousable. No new complaints.  Objective   Vitals:  Vitals:   07/25/20 0733 07/25/20 1157  BP:  110/61  Pulse:  88    Resp:  16  Temp:  98.1 F (36.7 C)  SpO2: 95% 97%   Exam:  Constitutional:  . The patient is sleeping. No acute distress. Respiratory:  . No increased work of breathing. . Breath sounds are diminished on the left base. Marland Kitchen No wheezes, rales, or rhonchi . No tactile fremitus Cardiovascular:  . Regular rate and rhythm . No murmurs, ectopy, or gallups. . No lateral PMI. No thrills. Abdomen:  . Abdomen is soft, non-tender, non-distended . No hernias, masses, or organomegaly . Normoactive bowel sounds.  Musculoskeletal:  . No cyanosis, clubbing, or edema Skin:  . No rashes, lesions, ulcers . palpation of skin: no induration or nodules Neurologic:  . CN 2-12 intact . Sensation all 4 extremities intact  I have personally reviewed the following:   Today's Data  . Vitals, CMP, CBC  Scheduled Meds: . amLODipine  5 mg Oral QHS  . arformoterol  15 mcg Nebulization BID  . budesonide (PULMICORT) nebulizer solution  0.25 mg Nebulization BID  . enoxaparin (LOVENOX) injection  80 mg Subcutaneous BID  . feeding supplement (GLUCERNA SHAKE)  237 mL Oral TID BM  . guaiFENesin  1,200 mg Oral BID  . levothyroxine  137 mcg Oral Q0600  . losartan  100 mg Oral QHS  . multivitamin with minerals  1 tablet Oral Daily  . revefenacin  175 mcg Nebulization Daily  . sertraline  100 mg Oral QHS  . Warfarin - Pharmacist Dosing Inpatient   Does not apply q1600   Continuous Infusions:  Principal Problem:  Pleural effusion on left Active Problems:   Hypothyroidism   COPD GOLD III   Rheumatoid arthritis (HCC)   Essential hypertension   Type 2 diabetes mellitus (HCC)   Anticoagulated on Coumadin   Collapsed lung   Microcytic anemia   Chest pain   Thrombocytosis   Generalized weakness   Stage II pressure ulcer of right buttock (HCC)   Obesity (BMI 30-39.9)   Mucus plugging of bronchi   LOS: 15 days    A & P  Large left-sided pleural effusion with collapsed lung status post chest  tube removed 06/2320 and bronchoscopy 9/23 with removal of mucous plug. Repeat chest x-ray 9/29 showed increasing consolidation throughout the left mid and lower lung fields with effusion. S/P bedside thoracentesis today by pulmonology. Aspirate is not indicative of malgnant or infected effusion. Sending cytology and cultures as may need to rule out lung cancer. Blood cultures grew 1 out of 4 staph epididymis - contaminant. Pulmonology following for possible re-accumulation. Consideration is given to re-accumulation due to autoimmune disease.  COPD Gold stage II apparently on oxygen at home 5 L at baseline. The patient is currently saturating at 96% on 3L. Continue DuoNeb every 6 as needed.  Rheumatoid arthritis followed by Dr. Dossie Der previously on methotrexate. Work-up with studies on pleural fluid as per pulmonology. Repeat chest x-ray as needed. Not on prior to admission steroids on prior blood cultures however stable at this time no antibiotics currently.  Anemia with transfusion 1 unit PRBC 9/18 and 1 unit 9/27. Source unclear-GI was not consulted? Hemoccult stools and recheck as needed. Could be secondary to anticoagulation on heparin/Coumadin. Nursing to inform if any bleeding.  HTN: Continue amlodipine 5+ losartan 100.  Thrombocytosis: Resolved without issue-monitor.  DM TY 2-prior to admission on Metformin 500 twice daily. Sugars ranging 90s to 100s eating about 75% of meals. Holding hypoglycemic agents at this time.  Hypothyroidism: Continue Synthroid 135 mcg daily.  Sacral decubitus stage I: Wound care has been consulted.  Previous DVTs on Coumadin: Continues on heparin at this time-transition back to Coumadin when all procedures are done.  DVT prophylaxis: IV heparin Code Status: Full Family Communication: updated daughter on telephone in detail Disposition:   Status is: Inpatient  Remains inpatient appropriate because:Persistent severe electrolyte disturbances and Ongoing  diagnostic testing needed not appropriate for outpatient work up  Dispo: The patient is from: Home  Anticipated d/c is to: SNF  Anticipated d/c date is: 3 days  Patient currently is not medically stable to d/c.  Armanie Ullmer, DO [facility] Direct contact: see [email or web address]  7PM-7AM contact night coverage as above 07/25/2020, 5:26 PM  LOS: 14 days

## 2020-07-26 ENCOUNTER — Inpatient Hospital Stay (HOSPITAL_COMMUNITY): Payer: Medicare Other

## 2020-07-26 LAB — URINALYSIS, ROUTINE W REFLEX MICROSCOPIC
Bilirubin Urine: NEGATIVE
Glucose, UA: NEGATIVE mg/dL
Ketones, ur: NEGATIVE mg/dL
Nitrite: NEGATIVE
Protein, ur: 100 mg/dL — AB
Specific Gravity, Urine: 1.014 (ref 1.005–1.030)
WBC, UA: >50 WBC/hpf — ABNORMAL HIGH (ref 0–5)
pH: 7 (ref 5.0–8.0)

## 2020-07-26 LAB — COMPREHENSIVE METABOLIC PANEL
ALT: 12 U/L (ref 0–44)
AST: 14 U/L — ABNORMAL LOW (ref 15–41)
Albumin: 2.5 g/dL — ABNORMAL LOW (ref 3.5–5.0)
Alkaline Phosphatase: 49 U/L (ref 38–126)
Anion gap: 8 (ref 5–15)
BUN: 6 mg/dL — ABNORMAL LOW (ref 8–23)
CO2: 28 mmol/L (ref 22–32)
Calcium: 8.3 mg/dL — ABNORMAL LOW (ref 8.9–10.3)
Chloride: 94 mmol/L — ABNORMAL LOW (ref 98–111)
Creatinine, Ser: 0.49 mg/dL (ref 0.44–1.00)
GFR calc Af Amer: >60 mL/min (ref >60)
GFR calc non Af Amer: >60 mL/min (ref >60)
Glucose, Bld: 102 mg/dL — ABNORMAL HIGH (ref 70–99)
Potassium: 4 mmol/L (ref 3.5–5.1)
Sodium: 130 mmol/L — ABNORMAL LOW (ref 135–145)
Total Bilirubin: 0.3 mg/dL (ref 0.3–1.2)
Total Protein: 5.4 g/dL — ABNORMAL LOW (ref 6.5–8.1)

## 2020-07-26 LAB — PH, BODY FLUID: pH, Body Fluid: 7.7

## 2020-07-26 LAB — CBC WITH DIFFERENTIAL/PLATELET
Abs Immature Granulocytes: 0.06 10*3/uL (ref 0.00–0.07)
Basophils Absolute: 0 10*3/uL (ref 0.0–0.1)
Basophils Relative: 1 %
Eosinophils Absolute: 0.5 10*3/uL (ref 0.0–0.5)
Eosinophils Relative: 6 %
HCT: 29 % — ABNORMAL LOW (ref 36.0–46.0)
Hemoglobin: 8.1 g/dL — ABNORMAL LOW (ref 12.0–15.0)
Immature Granulocytes: 1 %
Lymphocytes Relative: 25 %
Lymphs Abs: 2 10*3/uL (ref 0.7–4.0)
MCH: 20.6 pg — ABNORMAL LOW (ref 26.0–34.0)
MCHC: 27.9 g/dL — ABNORMAL LOW (ref 30.0–36.0)
MCV: 73.6 fL — ABNORMAL LOW (ref 80.0–100.0)
Monocytes Absolute: 0.8 10*3/uL (ref 0.1–1.0)
Monocytes Relative: 10 %
Neutro Abs: 4.8 10*3/uL (ref 1.7–7.7)
Neutrophils Relative %: 57 %
Platelets: 340 10*3/uL (ref 150–400)
RBC: 3.94 MIL/uL (ref 3.87–5.11)
RDW: 33.5 % — ABNORMAL HIGH (ref 11.5–15.5)
WBC: 8.2 10*3/uL (ref 4.0–10.5)
nRBC: 0 % (ref 0.0–0.2)

## 2020-07-26 LAB — PROTIME-INR
INR: 1.4 — ABNORMAL HIGH (ref 0.8–1.2)
Prothrombin Time: 16.3 seconds — ABNORMAL HIGH (ref 11.4–15.2)

## 2020-07-26 LAB — CYTOLOGY - NON PAP

## 2020-07-26 MED ORDER — WARFARIN SODIUM 10 MG PO TABS
10.0000 mg | ORAL_TABLET | Freq: Once | ORAL | Status: DC
  Filled 2020-07-26: qty 1

## 2020-07-26 MED ORDER — SODIUM CHLORIDE 0.9 % IV SOLN
1.0000 g | INTRAVENOUS | Status: DC
  Administered 2020-07-26 – 2020-07-27 (×2): 1 g via INTRAVENOUS
  Filled 2020-07-26 (×2): qty 10

## 2020-07-26 MED ORDER — MUSCLE RUB 10-15 % EX CREA
TOPICAL_CREAM | CUTANEOUS | Status: DC | PRN
  Administered 2020-07-26: 1 via TOPICAL
  Filled 2020-07-26: qty 85

## 2020-07-26 NOTE — Progress Notes (Signed)
ANTICOAGULATION CONSULT NOTE - Follow Up Consult  Pharmacy Consult for Coumadin/Lovenox Indication: h/o DVT/PE  Allergies  Allergen Reactions   Effexor [Venlafaxine Hydrochloride] Rash    Patient Measurements: Height: 5\' 2"  (157.5 cm) Weight: 84.4 kg (186 lb) IBW/kg (Calculated) : 50.1 Heparin Dosing Weight:    Vital Signs: Temp: 98.2 F (36.8 C) (10/04 1117) Temp Source: Oral (10/04 0328) BP: 115/69 (10/04 1117) Pulse Rate: 82 (10/04 1117)  Labs: Recent Labs    07/24/20 0812 07/24/20 0812 07/25/20 0335 07/26/20 0441  HGB 8.8*   < > 8.5* 8.1*  HCT 31.8*  --  30.6* 29.0*  PLT 481*  --  367 340  LABPROT 15.9*  --  15.6* 16.3*  INR 1.3*  --  1.3* 1.4*  CREATININE 0.47  --  0.48 0.49   < > = values in this interval not displayed.    Estimated Creatinine Clearance: 66.8 mL/min (by C-G formula based on SCr of 0.49 mg/dL).   Assessment: 30 yoF with hx DVT and PE (2008, 2012, 2014) on warfarin PTA admitted with SOB. Warfarin subtherapeutic on admit (question of compliance).  Pulm thoracentesis 10/2 bridged with LMWH.    Restarting warfarin per MD.  INR trended up slightly to 1.4 today. Hgb trended down to 8.1. We will give a higher dose today.  Goal of Therapy:  INR 2-3 Monitor platelets by anticoagulation protocol: Yes   Plan:  Coumadin 10mg  x1 Lovenox 80mg  SQ BID - continue until 2 therapeutic INRs Monitor CBCs, bleeding  Onnie Boer, PharmD, BCIDP, AAHIVP, CPP Infectious Disease Pharmacist 07/26/2020 11:33 AM

## 2020-07-26 NOTE — Progress Notes (Signed)
PROGRESS NOTE  Jaime Kelley IOX:[id number] DOB: 1951/02/26 DOA: 07/10/2020 PCP: Laurey Morale, MD  Brief History   69 year old community dwelling white female who is a prior smoker of 1/2 ppd. She carries a past medical history significant for  COPD chronically on 5 L of oxygen at home. She has rheumatoid arthritis previously on methotrexate. She also has a prior history of PE and was previously on Coumadin. She also has hypertension.   The patient presented on 07/10/2020 with complaints of substernal chest pressure without radiation. She gave it a 6/10 in terms of intensity. She was found to have hemoglobin of 6.8 in ED as well as a large left-sided pleural effusion with complete collapse left lung requiring a nonrebreather initially. Pulmonology service was consulted on 9/19/202. The patient underwent underwent left pleural space drainage with chest tube that was removed on 07/15/2020. A bronchoscopy performed 9/20 showed removal of solitary mucous plug. Repeat chest x-ray showed recurrent opacification. Pulmonology performed a repeat thoracentesis at bedside on 07/24/2020. Studies on this fluid are still pending, but it does not appear to be malignant or infected.  The patient has been evaluated by psychiatry who determined that she does not have capacity to make medical decisions.   At baseline she is very deconditioned and has not been able to walk around at home. The patient states that this is due to knee pain. She has a home health aide who comes to her house once a week. The patient lives with a roommate who assists her around the house.  Consultants  . Pulmonology  Procedures  . Thoracentesis x 2 . Chest tube placement and removal . Bronchoscopy  Antibiotics   Anti-infectives (From admission, onward)   Start     Dose/Rate Route Frequency Ordered Stop   07/26/20 1000  cefTRIAXone (ROCEPHIN) 1 g in sodium chloride 0.9 % 100 mL IVPB        1 g 200 mL/hr over 30 Minutes  Intravenous Every 24 hours 07/26/20 0816       Subjective  The patient is resting comfortably. No new complaints.  Objective   Vitals:  Vitals:   07/26/20 0750 07/26/20 1117  BP:  115/69  Pulse:  82  Resp:  17  Temp:  98.2 F (36.8 C)  SpO2: 94% 95%   Exam:  Constitutional:  . The patient is awake and alert. No acute distress. Respiratory:  . No increased work of breathing. . Breath sounds are diminished on the left base. Marland Kitchen No wheezes, rales, or rhonchi . No tactile fremitus Cardiovascular:  . Regular rate and rhythm . No murmurs, ectopy, or gallups. . No lateral PMI. No thrills. Abdomen:  . Abdomen is soft, non-tender, non-distended . No hernias, masses, or organomegaly . Normoactive bowel sounds.  Musculoskeletal:  . No cyanosis, clubbing, or edema Skin:  . No rashes, lesions, ulcers . palpation of skin: no induration or nodules Neurologic:  . CN 2-12 intact . Sensation all 4 extremities intact  I have personally reviewed the following:   Today's Data  . Vitals, CMP, CBC  Scheduled Meds: . amLODipine  5 mg Oral QHS  . arformoterol  15 mcg Nebulization BID  . budesonide (PULMICORT) nebulizer solution  0.25 mg Nebulization BID  . enoxaparin (LOVENOX) injection  80 mg Subcutaneous BID  . feeding supplement (GLUCERNA SHAKE)  237 mL Oral TID BM  . guaiFENesin  1,200 mg Oral BID  . levothyroxine  137 mcg Oral Q0600  . losartan  100 mg  Oral QHS  . multivitamin with minerals  1 tablet Oral Daily  . revefenacin  175 mcg Nebulization Daily  . sertraline  100 mg Oral QHS  . warfarin  10 mg Oral ONCE-1600  . Warfarin - Pharmacist Dosing Inpatient   Does not apply q1600   Continuous Infusions: . cefTRIAXone (ROCEPHIN)  IV 1 g (07/26/20 0843)    Principal Problem:   Pleural effusion on left Active Problems:   Hypothyroidism   COPD GOLD III   Rheumatoid arthritis (HCC)   Essential hypertension   Type 2 diabetes mellitus (HCC)   Anticoagulated on  Coumadin   Collapsed lung   Microcytic anemia   Chest pain   Thrombocytosis   Generalized weakness   Stage II pressure ulcer of right buttock (HCC)   Obesity (BMI 30-39.9)   Mucus plugging of bronchi   LOS: 16 days    A & P  Large left-sided pleural effusion with collapsed lung status post chest tube removed 06/2320 and bronchoscopy 9/23 with removal of mucous plug. Repeat chest x-ray 9/29 showed increasing consolidation throughout the left mid and lower lung fields with effusion. S/P bedside thoracentesis today by pulmonology. Aspirate is not indicative of malgnant or infected effusion. Sending cytology and cultures as may need to rule out lung cancer. Blood cultures grew 1 out of 4 staph epididymis - contaminant. CXR is increased in left effusion. Pulmonology following.. Consideration is given to re-accumulation due to autoimmune disease.  COPD Gold stage II apparently on oxygen at home 5 L at baseline. The patient is currently saturating at 96% on 3L. Continue DuoNeb every 6 as needed.  Rheumatoid arthritis followed by Dr. Dossie Der previously on methotrexate. Work-up with studies on pleural fluid as per pulmonology. Repeat chest x-ray as needed. Not on prior to admission steroids on prior blood cultures however stable at this time no antibiotics currently.  Anemia with transfusion 1 unit PRBC 9/18 and 1 unit 9/27. Source unclear-GI was not consulted? Hemoccult stools and recheck as needed. Could be secondary to anticoagulation on heparin/Coumadin. Nursing to inform if any bleeding.  HTN: Continue amlodipine 5+ losartan 100.  Thrombocytosis: Resolved without issue-monitor.  DM TY 2-prior to admission on Metformin 500 twice daily. Sugars ranging 90s to 100s eating about 75% of meals. Holding hypoglycemic agents at this time.  Hypothyroidism: Continue Synthroid 135 mcg daily.  Sacral decubitus stage I: Wound care has been consulted.  Previous DVTs on Coumadin: Continues on heparin at this  time-transition back to Coumadin when all procedures are done.  DVT prophylaxis: IV heparin Code Status: Full Family Communication: updated daughter on telephone in detail Disposition:   Status is: Inpatient  Remains inpatient appropriate because:Persistent severe electrolyte disturbances and Ongoing diagnostic testing needed not appropriate for outpatient work up  Dispo: The patient is from: Home  Anticipated d/c is to: SNF  Anticipated d/c date is: 3 days  Patient currently is not medically stable to d/c.  Ottie Tillery, DO [facility] Direct contact: see [email or web address]  7PM-7AM contact night coverage as above 07/26/2020, 1:25 PM  LOS: 14 days

## 2020-07-27 ENCOUNTER — Inpatient Hospital Stay: Payer: Medicare Other

## 2020-07-27 ENCOUNTER — Telehealth: Payer: Self-pay | Admitting: Internal Medicine

## 2020-07-27 ENCOUNTER — Telehealth: Payer: Self-pay | Admitting: Family Medicine

## 2020-07-27 DIAGNOSIS — Z807 Family history of other malignant neoplasms of lymphoid, hematopoietic and related tissues: Secondary | ICD-10-CM | POA: Diagnosis not present

## 2020-07-27 DIAGNOSIS — Z79899 Other long term (current) drug therapy: Secondary | ICD-10-CM | POA: Diagnosis not present

## 2020-07-27 DIAGNOSIS — Z86711 Personal history of pulmonary embolism: Secondary | ICD-10-CM | POA: Diagnosis not present

## 2020-07-27 DIAGNOSIS — R52 Pain, unspecified: Secondary | ICD-10-CM | POA: Diagnosis not present

## 2020-07-27 DIAGNOSIS — Z7901 Long term (current) use of anticoagulants: Secondary | ICD-10-CM | POA: Diagnosis not present

## 2020-07-27 DIAGNOSIS — Z888 Allergy status to other drugs, medicaments and biological substances status: Secondary | ICD-10-CM | POA: Diagnosis not present

## 2020-07-27 DIAGNOSIS — F32A Depression, unspecified: Secondary | ICD-10-CM | POA: Diagnosis present

## 2020-07-27 DIAGNOSIS — I5020 Unspecified systolic (congestive) heart failure: Secondary | ICD-10-CM | POA: Diagnosis present

## 2020-07-27 DIAGNOSIS — M6281 Muscle weakness (generalized): Secondary | ICD-10-CM | POA: Diagnosis present

## 2020-07-27 DIAGNOSIS — M0600 Rheumatoid arthritis without rheumatoid factor, unspecified site: Secondary | ICD-10-CM | POA: Diagnosis present

## 2020-07-27 DIAGNOSIS — Z87891 Personal history of nicotine dependence: Secondary | ICD-10-CM | POA: Diagnosis not present

## 2020-07-27 DIAGNOSIS — Z7984 Long term (current) use of oral hypoglycemic drugs: Secondary | ICD-10-CM | POA: Diagnosis not present

## 2020-07-27 DIAGNOSIS — J440 Chronic obstructive pulmonary disease with acute lower respiratory infection: Secondary | ICD-10-CM | POA: Diagnosis present

## 2020-07-27 DIAGNOSIS — K219 Gastro-esophageal reflux disease without esophagitis: Secondary | ICD-10-CM | POA: Diagnosis present

## 2020-07-27 DIAGNOSIS — M069 Rheumatoid arthritis, unspecified: Secondary | ICD-10-CM | POA: Diagnosis present

## 2020-07-27 DIAGNOSIS — J9383 Other pneumothorax: Secondary | ICD-10-CM | POA: Diagnosis present

## 2020-07-27 DIAGNOSIS — Z7401 Bed confinement status: Secondary | ICD-10-CM | POA: Diagnosis not present

## 2020-07-27 DIAGNOSIS — J90 Pleural effusion, not elsewhere classified: Secondary | ICD-10-CM | POA: Diagnosis present

## 2020-07-27 DIAGNOSIS — R2689 Other abnormalities of gait and mobility: Secondary | ICD-10-CM | POA: Diagnosis present

## 2020-07-27 DIAGNOSIS — R2681 Unsteadiness on feet: Secondary | ICD-10-CM | POA: Diagnosis present

## 2020-07-27 DIAGNOSIS — F29 Unspecified psychosis not due to a substance or known physiological condition: Secondary | ICD-10-CM | POA: Diagnosis not present

## 2020-07-27 DIAGNOSIS — J439 Emphysema, unspecified: Secondary | ICD-10-CM | POA: Diagnosis not present

## 2020-07-27 DIAGNOSIS — U071 COVID-19: Secondary | ICD-10-CM | POA: Diagnosis not present

## 2020-07-27 DIAGNOSIS — R0602 Shortness of breath: Secondary | ICD-10-CM | POA: Diagnosis present

## 2020-07-27 DIAGNOSIS — M2550 Pain in unspecified joint: Secondary | ICD-10-CM | POA: Diagnosis not present

## 2020-07-27 DIAGNOSIS — F4322 Adjustment disorder with anxiety: Secondary | ICD-10-CM | POA: Diagnosis not present

## 2020-07-27 DIAGNOSIS — E118 Type 2 diabetes mellitus with unspecified complications: Secondary | ICD-10-CM | POA: Diagnosis present

## 2020-07-27 DIAGNOSIS — E039 Hypothyroidism, unspecified: Secondary | ICD-10-CM | POA: Diagnosis present

## 2020-07-27 DIAGNOSIS — R4182 Altered mental status, unspecified: Secondary | ICD-10-CM | POA: Diagnosis not present

## 2020-07-27 DIAGNOSIS — Z79890 Hormone replacement therapy (postmenopausal): Secondary | ICD-10-CM | POA: Diagnosis not present

## 2020-07-27 DIAGNOSIS — Z86718 Personal history of other venous thrombosis and embolism: Secondary | ICD-10-CM | POA: Diagnosis not present

## 2020-07-27 DIAGNOSIS — J9811 Atelectasis: Secondary | ICD-10-CM | POA: Diagnosis not present

## 2020-07-27 DIAGNOSIS — E119 Type 2 diabetes mellitus without complications: Secondary | ICD-10-CM | POA: Diagnosis present

## 2020-07-27 DIAGNOSIS — I2699 Other pulmonary embolism without acute cor pulmonale: Secondary | ICD-10-CM | POA: Diagnosis present

## 2020-07-27 DIAGNOSIS — L930 Discoid lupus erythematosus: Secondary | ICD-10-CM | POA: Diagnosis present

## 2020-07-27 DIAGNOSIS — J9601 Acute respiratory failure with hypoxia: Secondary | ICD-10-CM | POA: Diagnosis not present

## 2020-07-27 DIAGNOSIS — Z6841 Body Mass Index (BMI) 40.0 and over, adult: Secondary | ICD-10-CM | POA: Diagnosis not present

## 2020-07-27 DIAGNOSIS — Z23 Encounter for immunization: Secondary | ICD-10-CM

## 2020-07-27 DIAGNOSIS — J9621 Acute and chronic respiratory failure with hypoxia: Secondary | ICD-10-CM | POA: Diagnosis present

## 2020-07-27 DIAGNOSIS — J449 Chronic obstructive pulmonary disease, unspecified: Secondary | ICD-10-CM | POA: Diagnosis not present

## 2020-07-27 DIAGNOSIS — J181 Lobar pneumonia, unspecified organism: Secondary | ICD-10-CM | POA: Diagnosis not present

## 2020-07-27 DIAGNOSIS — I10 Essential (primary) hypertension: Secondary | ICD-10-CM | POA: Diagnosis present

## 2020-07-27 DIAGNOSIS — Z9981 Dependence on supplemental oxygen: Secondary | ICD-10-CM | POA: Diagnosis not present

## 2020-07-27 DIAGNOSIS — J189 Pneumonia, unspecified organism: Secondary | ICD-10-CM | POA: Diagnosis not present

## 2020-07-27 DIAGNOSIS — J1282 Pneumonia due to coronavirus disease 2019: Secondary | ICD-10-CM | POA: Diagnosis not present

## 2020-07-27 DIAGNOSIS — E871 Hypo-osmolality and hyponatremia: Secondary | ICD-10-CM | POA: Diagnosis present

## 2020-07-27 LAB — PROTIME-INR
INR: 1.3 — ABNORMAL HIGH (ref 0.8–1.2)
Prothrombin Time: 16.1 seconds — ABNORMAL HIGH (ref 11.4–15.2)

## 2020-07-27 LAB — URINE CULTURE

## 2020-07-27 LAB — BODY FLUID CULTURE: Culture: NO GROWTH

## 2020-07-27 LAB — SARS CORONAVIRUS 2 BY RT PCR (HOSPITAL ORDER, PERFORMED IN ~~LOC~~ HOSPITAL LAB): SARS Coronavirus 2: NEGATIVE

## 2020-07-27 MED ORDER — ARFORMOTEROL TARTRATE 15 MCG/2ML IN NEBU: 15.0000 ug | INHALATION_SOLUTION | Freq: Two times a day (BID) | RESPIRATORY_TRACT | 0 refills | Status: AC

## 2020-07-27 MED ORDER — BUDESONIDE 0.25 MG/2ML IN SUSP: 0.2500 mg | Freq: Two times a day (BID) | RESPIRATORY_TRACT | 12 refills | Status: AC

## 2020-07-27 MED ORDER — GUAIFENESIN ER 600 MG PO TB12: 1200.0000 mg | ORAL_TABLET | Freq: Two times a day (BID) | ORAL | 0 refills | Status: AC

## 2020-07-27 MED ORDER — MUSCLE RUB 10-15 % EX CREA: 1.0000 "application " | TOPICAL_CREAM | CUTANEOUS | 0 refills | Status: DC | PRN

## 2020-07-27 MED ORDER — WARFARIN SODIUM 7.5 MG PO TABS: ORAL_TABLET | ORAL | 0 refills | Status: DC

## 2020-07-27 MED ORDER — HYDROCODONE-ACETAMINOPHEN 10-325 MG PO TABS
0.5000 | ORAL_TABLET | Freq: Four times a day (QID) | ORAL | 0 refills | Status: DC | PRN
Start: 2020-07-27 — End: 2020-08-17

## 2020-07-27 MED ORDER — CEFDINIR 300 MG PO CAPS: 300.0000 mg | ORAL_CAPSULE | Freq: Two times a day (BID) | ORAL | 0 refills | Status: AC

## 2020-07-27 MED ORDER — REVEFENACIN 175 MCG/3ML IN SOLN: 175.0000 ug | Freq: Every day | RESPIRATORY_TRACT | 0 refills | Status: AC

## 2020-07-27 MED ORDER — ADULT MULTIVITAMIN W/MINERALS CH: 1.0000 | ORAL_TABLET | Freq: Every day | ORAL | 0 refills | Status: AC

## 2020-07-27 MED ORDER — GLUCERNA SHAKE PO LIQD: 237.0000 mL | Freq: Three times a day (TID) | ORAL | 0 refills | Status: DC

## 2020-07-27 MED ORDER — WARFARIN SODIUM 10 MG PO TABS: 10.0000 mg | ORAL_TABLET | Freq: Once | ORAL | 11 refills | Status: DC

## 2020-07-27 NOTE — Care Management Important Message (Signed)
Important Message  Patient Details  Name: CAROLEANN CASLER MRN: [id number] Date of Birth: Oct 13, 1951   Medicare Important Message Given:  Yes     Shelda Altes 07/27/2020, 3:44 PM

## 2020-07-27 NOTE — Discharge Summary (Signed)
Physician Discharge Summary  Jaime Kelley VOZ:[id number] DOB: Oct 14, 1951 DOA: 07/10/2020  PCP: Laurey Morale, MD  Admit date: 07/10/2020 Discharge date: 07/27/2020  Recommendations for Outpatient Follow-up:  1. Discharge to SNF. 2. Check INR on 07/29/2020 and report to facility physician. 3. Follow up with PCP in 7-10 days after discharge from facility. 4. Follow up with pulmonology as directed in 1-2 months.   Contact information for after-discharge care    Destination    [facility] .   Service: Skilled Nursing Contact information: [place] [phone]                   Discharge Diagnoses: Principal diagnosis is #1 1. Left pleural effusion with collapse of lung 2. Hypothyroidism 3. COPD Gold III 4. RA 5. Hypertension 6. DM II 7. Sacral pressure ulcer 8. Obesity 9. Dialted CHF  Discharge Condition: Fair  Disposition: SNF  Diet recommendation: Heart healthy/modified carbohydrate  Filed Weights   07/25/20 0438 07/26/20 0500 07/27/20 0535  Weight: 77.6 kg 84.4 kg 81.6 kg    History of present illness:   Jaime Kelley is a 69 y.o. 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 She is normally on oxygen by nasal cannula at 5 L/min at home.  She reports that this morning she had increased shortness of breath and then developed a substernal chest pressure.  She reports the pressure is underneath her sternum and does not radiate.  She reports that was a 6 out of 10 at its worst.  As the day progressed she had increasing shortness of breath.  She reports that she has increased shortness of breath with talking or shifting position in bed.  She has been too weak to get out of bed and has not tried to  ambulate.  She did not have any alleviating  factors for the chest pain or shortness of breath.  She reports she has had generalized weakness the last month and has not been up and out of bed.  She has a caregiver that helps her at home.  She reports she has not had any fevers or chills.  She has a chronic cough that is nonproductive and is unchanged.  She denies any abdominal pain, cramping, nausea, vomiting, diarrhea, black tarry stool or blood in her stool.  She denies any urinary frequency or dysuria.  She has not had any rash.  She has not had any rash.  She denies any swelling of her legs.  She states she has been taking her medications at home including her Coumadin.  She is not sure when she had her last INR checked.  Denies any known sick contacts or exposures to COVID-19. He has a history of smoking but quit in 2018.  She denies alcohol or illicit drug use  ED Course:   Emergency room patient is found to be anemic with a hemoglobin of 6.8.  Last hemoglobin was over 13 on lab 3 years ago in her chart.  There are no other labs to compare to.  INR is subtherapeutic at 1.1 on testing today.  Found to have a large left-sided pleural effusion which is causing complete collapse of her left lung.  She is now requiring a nonrebreather to maintain O2 sat.  Patient was Hemoccult negative  with brown stool on rectal exam in the emergency room by the ER provider.  ER provider discussed case with pulmonology stated they will see patient in the morning for thoracentesis if her condition decompensates overnight to call them back and they will evaluate patient emergently at that time and perform thoracentesis if required overnight.   Hospital Course:  69 year old community dwelling white female who is a prior smoker of 1/2 ppd. She carries a past medical history significant for  COPDchronically on 5 L of oxygen at home. She has rheumatoid arthritis previously on methotrexate. She also has a prior history of PE and  was previously on Coumadin. She also has hypertension.   The patient presented on 07/10/2020 with complaints of substernal chest pressure without radiation. She gave it a 6/10 in terms of intensity. She was found to have hemoglobin of 6.8 in ED as well as a large left-sided pleural effusion with complete collapse left lung requiring a nonrebreather initially. Pulmonology service was consulted on 9/19/202. The patient underwent underwent left pleural space drainage with chest tube that was removed on 07/15/2020. A bronchoscopy performed 9/20 showed removal of solitary mucous plug. Repeat chest x-ray showed recurrent opacification. Pulmonology performed a repeat thoracentesis at bedside on 07/24/2020. Studies on this fluid are still pending, but it does not appear to be malignant or infected.  The patient has been evaluated by psychiatry who determined that she does not have capacity to make medical decisions.   At baseline she is very deconditioned and has not been able to walk around at home. The patient states that this is due to knee pain. She has a home health aide who comes to her house once a week. The patient lives with a roommate who assists her around the house.  Repeat evaluation of her chest x-ray on 07/27/2020 demonstrated a small amount of re-accumulation of pleural effusion. However, oxygen requirements are currently 3L of O2 to maintain SaO2 between 96 and 100%. Her baseline requirements prior to admission was 5L O2.  Pulmonology has signed off and will follow up with patient in clinic in 1-2 months.  The patient will be discharged to SNF to day in fair condition. She has received her one dose of the J&J COVID vaccine.  Today's assessment: S: The patient is resting comfortable. No new complaints.  O: Vitals:  Vitals:   07/27/20 0824 07/27/20 1128  BP:  110/76  Pulse:  83  Resp:  20  Temp:  98.2 F (36.8 C)  SpO2: 97% 96%   Exam:  Constitutional:   The patient is awake and  alert. No acute distress. Respiratory:   No increased work of breathing.  Breath sounds are diminished on the left base.  No wheezes, rales, or rhonchi  No tactile fremitus Cardiovascular:   Regular rate and rhythm  No murmurs, ectopy, or gallups.  No lateral PMI. No thrills. Abdomen:   Abdomen is soft, non-tender, non-distended  No hernias, masses, or organomegaly  Normoactive bowel sounds.  Musculoskeletal:   No cyanosis, clubbing, or edema Skin:   No rashes, lesions, ulcers  palpation of skin: no induration or nodules Neurologic:   CN 2-12 intact  Sensation all 4 extremities intact   Discharge Instructions  Discharge Instructions    Activity as tolerated - No restrictions   Complete by: As directed    Call MD for:  difficulty breathing, headache or visual disturbances   Complete by: As directed    Call MD for:  redness, tenderness, or  signs of infection (pain, swelling, redness, odor or green/yellow discharge around incision site)   Complete by: As directed    Call MD for:  severe uncontrolled pain   Complete by: As directed    Call MD for:  temperature >100.4   Complete by: As directed    Diet - low sodium heart healthy   Complete by: As directed    Discharge instructions   Complete by: As directed    Discharge to SNF. Check INR on 07/29/2020 and report to facility physician. Follow up with PCP in 7-10 days after discharge from facility. Follow up with pulmonology as directed.   Discharge wound care:   Complete by: As directed    Daily - Clean buttock area with soap and water. Pat dry. Apply 78M skin barrier wipes to wound area and skin surrounding wound, allow to air dry. Apply one Hydrocolloid dressing Kellie Simmering # [phone]). Change daily. Apply Hydrocolloid dressing to left buttock wound.   Increase activity slowly   Complete by: As directed      Allergies as of 07/27/2020      Reactions   Effexor [venlafaxine Hydrochloride] Rash      Medication  List    STOP taking these medications   Benadryl Allergy 25 MG tablet Generic drug: diphenhydrAMINE     TAKE these medications   albuterol 108 (90 Base) MCG/ACT inhaler Commonly known as: ProAir HFA INHALE 2 PUFFS INTO THE LUNGS EVERY 4 (FOUR) HOURS AS NEEDED FOR WHEEZING. What changed:   how much to take  how to take this  when to take this  reasons to take this  additional instructions   ALPRAZolam 1 MG tablet Commonly known as: XANAX Take 1 tablet (1 mg total) by mouth 3 (three) times daily as needed for anxiety.   amLODipine 10 MG tablet Commonly known as: NORVASC TAKE 1 TABLET BY MOUTH EVERY DAY What changed: when to take this   arformoterol 15 MCG/2ML Nebu Commonly known as: BROVANA Take 2 mLs (15 mcg total) by nebulization 2 (two) times daily.   budesonide 0.25 MG/2ML nebulizer solution Commonly known as: PULMICORT Take 2 mLs (0.25 mg total) by nebulization 2 (two) times daily.   cefdinir 300 MG capsule Commonly known as: OMNICEF Take 1 capsule (300 mg total) by mouth 2 (two) times daily for 3 days.   feeding supplement (GLUCERNA SHAKE) Liqd Take 237 mLs by mouth 3 (three) times daily between meals.   guaiFENesin 600 MG 12 hr tablet Commonly known as: MUCINEX Take 2 tablets (1,200 mg total) by mouth 2 (two) times daily.   HYDROcodone-acetaminophen 10-325 MG tablet Commonly known as: NORCO Take 0.5 tablets by mouth 4 (four) times daily as needed (pain).   ipratropium-albuterol 0.5-2.5 (3) MG/78ML Soln Commonly known as: DUONEB TAKE 3 MLS BY NEBULIZATION EVERY 4 (FOUR) HOURS AS NEEDED. What changed:   when to take this  reasons to take this   losartan 100 MG tablet Commonly known as: COZAAR TAKE 1 TABLET BY MOUTH EVERY DAY What changed: when to take this   metFORMIN 500 MG tablet Commonly known as: GLUCOPHAGE TAKE 1 TABLET BY MOUTH TWICE A DAY What changed: when to take this   multivitamin with minerals Tabs tablet Take 1 tablet by mouth  daily. Start taking on: July 28, 2020   Muscle Rub 10-15 % Crea Apply 1 application topically as needed for muscle pain.   OXYGEN Inhale 5 L into the lungs. continuous   revefenacin 175 MCG/78ML nebulizer solution  Commonly known as: YUPELRI Take 3 mLs (175 mcg total) by nebulization daily. Start taking on: July 28, 2020   sertraline 100 MG tablet Commonly known as: ZOLOFT TAKE 1 TABLET BY MOUTH EVERY DAY What changed: when to take this   Synthroid 137 MCG tablet Generic drug: levothyroxine TAKE 1 TABLET BY MOUTH EVERY DAY BEFORE BREAKFAST What changed: See the new instructions.   warfarin 7.5 MG tablet Commonly known as: COUMADIN Take as directed. If you are unsure how to take this medication, talk to your nurse or doctor. Original instructions: Take 1 tablet daily beginning 07/28/2020 or AS DIRECTED BY ANTICOAGULATION CLINIC What changed: additional instructions   warfarin 10 MG tablet Commonly known as: Coumadin Take as directed. If you are unsure how to take this medication, talk to your nurse or doctor. Original instructions: Take 1 tablet (10 mg total) by mouth one time only at 4 PM for 1 dose. What changed: You were already taking a medication with the same name, and this prescription was added. Make sure you understand how and when to take each.            Discharge Care Instructions  (From admission, onward)         Start     Ordered   07/27/20 0000  Discharge wound care:       Comments: Daily - Clean buttock area with soap and water. Pat dry. Apply 80M skin barrier wipes to wound area and skin surrounding wound, allow to air dry. Apply one Hydrocolloid dressing Kellie Simmering # [phone]). Change daily. Apply Hydrocolloid dressing to left buttock wound.   07/27/20 1020         Allergies  Allergen Reactions  . Effexor [Venlafaxine Hydrochloride] Rash    The results of significant diagnostics from this hospitalization (including imaging, microbiology,  ancillary and laboratory) are listed below for reference.    Significant Diagnostic Studies: DG Chest 2 View  Result Date: 07/26/2020 CLINICAL DATA:  Left pleural effusion EXAM: CHEST - 2 VIEW COMPARISON:  07/24/2020 FINDINGS: Small left pleural effusion is increased. Left basilar atelectasis is present. No pneumothorax. Background changes of emphysema. Stable cardiomediastinal contours. IMPRESSION: Small left pleural effusion increased since 07/24/2020. Mild left basilar atelectasis. Electronically Signed   By: Macy Mis M.D.   On: 07/26/2020 08:07   CT Head Wo Contrast  Result Date: 07/10/2020 CLINICAL DATA:  Delirium EXAM: CT HEAD WITHOUT CONTRAST TECHNIQUE: Contiguous axial images were obtained from the base of the skull through the vertex without intravenous contrast. COMPARISON:  None. FINDINGS: Brain: Normal anatomic configuration. Parenchymal volume loss is advanced in relation to the patient's age. Moderate periventricular white matter changes are present likely reflecting the sequela of small vessel ischemia. Remote left pontine infarct. No abnormal intra or extra-axial mass lesion or fluid collection. No abnormal mass effect or midline shift. No evidence of acute intracranial hemorrhage or infarct. Borderline ventriculomegaly is commensurate with the degree of cerebral atrophy and likely reflects central atrophy. Cerebellum unremarkable. Vascular: No asymmetric hyperdense vasculature at the skull base. Skull: Intact Sinuses/Orbits: Moderate mucosal thickening and air-fluid level noted within the a sphenoid sinus. Osteoma noted within the left frontal sinus. Remaining paranasal sinuses are clear. Orbits are unremarkable. Other: There is near complete fluid opacification of the mastoid air cells and middle ear cavities bilaterally. No definite superimposed osseous erosion. IMPRESSION: No evidence of acute intracranial hemorrhage or infarct. Relatively advanced senescent changes.  Remote left  pontine infarct. Extensive fluid opacification of the mastoid air  cells and middle ear cavities. Correlation for otomastoiditis is recommended. Sphenoid sinusitis. Electronically Signed   By: Fidela Salisbury MD   On: 07/10/2020 19:35   CT CHEST WO CONTRAST  Result Date: 07/23/2020 CLINICAL DATA:  Abnormal radiograph, pleural effusion EXAM: CT CHEST WITHOUT CONTRAST TECHNIQUE: Multidetector CT imaging of the chest was performed following the standard protocol without IV contrast. COMPARISON:  Radiograph 07/21/2020, CT 07/10/2020 FINDINGS: Cardiovascular: Cardiac size within normal limits. Small volume pericardial fluid is similar to prior. Dense calcifications along the mitral annulus. Additional calcifications of the aortic leaflets. Hypoattenuation of the cardiac blood pool compatible with anemia. Extensive three-vessel native coronary artery atherosclerosis. Atherosclerotic plaque within the normal caliber aorta. No hyperdense mural thickening or plaque displacement nor periaortic stranding or hemorrhage. Shared origin of the brachiocephalic and left common carotid arteries. Additional calcifications seen throughout the proximal great vessels. Central pulmonary arteries are normal caliber. Luminal evaluation precluded in the absence of contrast media. No visible worrisome venous abnormalities. Mediastinum/Nodes: Persistent slight leftward mediastinal shift, similar to comparison. No mediastinal fluid, gas or hemorrhage. Redemonstration of multiple asymmetrically prominent though non pathologically enlarged left axillary and mediastinal lymph nodes. No right axillary adenopathy. Hilar nodal is severely limited in the absence of contrast media. No acute abnormality of the trachea or esophagus. Thyroid gland is diminutive, correlate for prior thyroidectomy or ablation. Lungs/Pleura: Background of moderate centrilobular emphysematous changes throughout both lungs. There is a moderate residual left pleural effusion  with adjacent areas of passive atelectasis in the left lung base. Some additional thickening is noted along the left major fissure which may reflect some persistent loculated fluid. Adjacent atelectatic changes are present along the left major fissure as well. Predominantly involving the lingula and posterior segment left upper lobe. Underlying consolidation would be difficult to exclude. Some there is a stable sub solid opacity in the posterosuperior segment right lower lobe (4/74 measuring up to 8 mm. Possibly post infectious or inflammatory. Stable scarring within the anterior basal segment right lower lobe and major fissure. Right lung is otherwise clear. Upper Abdomen: Multiple calcified gallstones within the largely decompressed gallbladder. Scattered colonic diverticula without focal inflammation to suggest diverticulitis. Extensive upper abdominal atherosclerotic calcification. Stable likely parapelvic cyst in the upper pole left kidney. Musculoskeletal: Mild body wall edema, left slightly more pronounced than right. No acute abnormality or worrisome osseous lesions. Levocurvature of the upper thoracic spine with some mild dextrocurvature of the mid to lower levels. Multilevel discogenic and facet degenerative changes are present. Additional degenerative changes of the right shoulder and sternoclavicular joints similar to prior. IMPRESSION: 1. Moderate residual left pleural effusion with adjacent areas of passive atelectasis in the left lung base. Some additional thickening along the left major fissure may reflect some persistent loculated fluid with some further atelectatic change in the lingula as well. Underlying consolidation within the atelectatic lung would cannot be fully excluded. 2. Stable appearance of an 8 mm ground-glass opacity in the posterosuperior segment right lower lobe. Possibly post infectious or inflammatory. Recommend attention on follow-up imaging. Initial follow-up with CT at 6-12  months is recommended to confirm persistence. If persistent, repeat CT is recommended every 2 years until 5 years of stability has been established. This recommendation follows the consensus statement: Guidelines for Management of Incidental Pulmonary Nodules Detected on CT Images: From the Fleischner Society 2017; Radiology 2017; 284:228-243. 3. Redemonstration of multiple asymmetrically prominent though non pathologically enlarged left axillary and mediastinal lymph nodes, nonspecific though possibly reactive or edematous. 4. Cholelithiasis without  evidence of acute cholecystitis. 5. Colonic diverticulosis without evidence of diverticulitis. 6. Aortic Atherosclerosis (ICD10-I70.0) 7. Three-vessel coronary artery calcifications. 8. Emphysema (ICD10-J43.9). Electronically Signed   By: Lovena Le M.D.   On: 07/23/2020 22:26   CT Chest W Contrast  Result Date: 07/10/2020 CLINICAL DATA:  Pleural effusion EXAM: CT CHEST WITH CONTRAST TECHNIQUE: Multidetector CT imaging of the chest was performed during intravenous contrast administration. CONTRAST:  40mL OMNIPAQUE IOHEXOL 300 MG/ML  SOLN COMPARISON:  None. FINDINGS: Cardiovascular: There is extensive coronary artery calcification. Global cardiac size within normal limits. Left ventricular hypertrophy noted. Moderate calcification of the mitral valve annulus. No pericardial effusion. The central pulmonary arteries are of normal caliber. The thoracic aorta is of normal caliber. There is extensive atherosclerotic calcifications seen within the arch vasculature proximally, particularly the left subclavian artery, however, the degree of stenosis is not well assessed on this non arteriographic study. Scattered mild atherosclerotic calcifications seen within the descending thoracic aorta., Mediastinum/Nodes: No pathologic adenopathy. Thyroid unremarkable. Esophagus unremarkable. Lungs/Pleura: A large left pleural effusion is present with complete opacification of the  left hemithorax and complete collapse of the left lung. There is debris seen within the left mainstem bronchus. Moderate emphysema involving the aerated right lung. No superimposed focal pulmonary infiltrate or nodule. No pneumothorax or pleural effusion on the right. Upper Abdomen: No acute abnormality. Musculoskeletal: Osseous structures are age-appropriate. No acute bone abnormality. IMPRESSION: Large left pleural effusion with complete collapse of the left lung. Moderate debris within the left mainstem bronchus. Extensive coronary artery calcification. Peripheral vascular disease with extensive calcification of the origin of the left subclavian artery. If there is suggestion of hemodynamically significant stenosis (subclavian steal, left upper extremity claudication), this would be better assessed with CT arteriography. Aortic Atherosclerosis (ICD10-I70.0) and Emphysema (ICD10-J43.9). Electronically Signed   By: Fidela Salisbury MD   On: 07/10/2020 19:45   DG CHEST PORT 1 VIEW  Result Date: 07/24/2020 CLINICAL DATA:  Status post left thoracentesis EXAM: PORTABLE CHEST 1 VIEW COMPARISON:  07/21/2020 FINDINGS: Trace left pleural effusion, improved status post thoracentesis. No pneumothorax is seen. Right lung is essentially clear. The heart is normal in size.  Thoracic aortic atherosclerosis. IMPRESSION: Trace left pleural effusion, improved status post thoracentesis. No pneumothorax is seen. Electronically Signed   By: Julian Hy M.D.   On: 07/24/2020 13:01   DG Chest Port 1 View  Result Date: 07/21/2020 CLINICAL DATA:  Shortness of breath EXAM: PORTABLE CHEST 1 VIEW COMPARISON:  July 15, 2020 FINDINGS: There is consolidation throughout the left mid and lower lung regions with questionable superimposed left pleural effusion. The right lung is clear. Heart is upper normal in size with pulmonary vascularity normal. No adenopathy. There is aortic atherosclerosis. No bone lesions. IMPRESSION:  Consolidation throughout much of the left mid and lower lung regions with suspected small pleural effusions superimposed on the left. Right lung clear. Heart upper normal in size. No adenopathy. Aortic Atherosclerosis (ICD10-I70.0). Electronically Signed   By: Lowella Grip III M.D.   On: 07/21/2020 14:53   DG Chest Port 1 View  Result Date: 07/15/2020 CLINICAL DATA:  Left chest tube EXAM: PORTABLE CHEST 1 VIEW COMPARISON:  07/14/2020 FINDINGS: Slight retraction of the left small bore chest tube. No significant pneumothorax by plain radiography. Persistent left basilar collapse/consolidation. No enlarging effusion. Stable right lung aeration. Heart remains enlarged. Aorta atherosclerotic.  Degenerative changes noted of the spine. IMPRESSION: Slight retraction of the left chest tube. No current significant pneumothorax by plain radiography Persistent  left lower lung collapse/consolidation. Aortic Atherosclerosis (ICD10-I70.0). Electronically Signed   By: Jerilynn Mages.  Shick M.D.   On: 07/15/2020 08:00   DG Chest Port 1 View  Result Date: 07/14/2020 CLINICAL DATA:  Chest tube present EXAM: PORTABLE CHEST 1 VIEW COMPARISON:  July 14, 2020 study obtained earlier in the day. FINDINGS: Chest tube on the left is unchanged in position. No evident pneumothorax. There is bibasilar atelectatic change, somewhat more on the left than on the right, stable. No new opacity evident. Heart is upper normal in size with pulmonary vascularity normal. No adenopathy. There is aortic atherosclerosis. No bone lesions. There is bilateral carotid artery calcification. IMPRESSION: Chest tube unchanged on the left without evident pneumothorax. Lower lung region atelectasis is stable. No new opacity. Stable cardiac silhouette. Aortic Atherosclerosis (ICD10-I70.0). There is bilateral carotid artery calcification. Electronically Signed   By: Lowella Grip III M.D.   On: 07/14/2020 13:25   DG Chest Port 1 View  Result Date:  07/14/2020 CLINICAL DATA:  Chest tube surveillance EXAM: PORTABLE CHEST 1 VIEW COMPARISON:  07/13/2020 FINDINGS: Left-sided chest tube remains in place. Stable cardiomegaly. Aortic atherosclerosis. Suspect small residual left-sided pleural effusion. Persistent bibasilar atelectasis. No pneumothorax is seen. IMPRESSION: 1. Left-sided chest tube remains in place. No pneumothorax. 2. Persistent bibasilar atelectasis and probable small residual left pleural effusion. Electronically Signed   By: Davina Poke D.O.   On: 07/14/2020 08:18   DG Chest Port 1 View  Result Date: 07/13/2020 CLINICAL DATA:  69 year old female with pleural effusion, chest tube placed. EXAM: PORTABLE CHEST 1 VIEW COMPARISON:  Portable chest 07/12/2020 and earlier. FINDINGS: Portable AP semi upright view at 1222 hours. Left chest tube position has not significantly changed. No pneumothorax. No residual pleural effusion is evident now. Mild patchy residual opacity at the left lung base. Mediastinal contours are within normal limits. Allowing for portable technique the right lung appears clear. Negative trachea. No acute osseous abnormality identified. IMPRESSION: 1. Stable left chest tube. Residual atelectasis but no pneumothorax or residual pleural effusion. 2. No new cardiopulmonary abnormality. Electronically Signed   By: Genevie Ann M.D.   On: 07/13/2020 12:41   DG CHEST PORT 1 VIEW  Result Date: 07/12/2020 CLINICAL DATA:  Left pleural effusion EXAM: PORTABLE CHEST 1 VIEW COMPARISON:  07/11/2020 FINDINGS: Decreasing left pleural effusion with left chest tube in place. Moderate to large left effusion remains with improved aeration in the left lung. Continued left lower lobe atelectasis or infiltrate. No confluent opacity on the right. No pneumothorax. Aortic atherosclerosis. IMPRESSION: Decreasing left effusion. The left effusion remains moderate to large with left lower lobe atelectasis or infiltrate. Left chest tube in place.  No visible  pneumothorax. Electronically Signed   By: Rolm Baptise M.D.   On: 07/12/2020 15:56   DG CHEST PORT 1 VIEW  Result Date: 07/11/2020 CLINICAL DATA:  Status post thoracocentesis EXAM: PORTABLE CHEST 1 VIEW COMPARISON:  July 10, 2020 FINDINGS: The cardiomediastinal silhouette is completely obscured.Persistent complete opacification of the LEFT hemithorax. Large LEFT pleural effusion. No significant pneumothorax. RIGHT lung is clear. Visualized abdomen is unremarkable. Multilevel degenerative changes of the thoracic spine. IMPRESSION: Persistent complete opacification of the LEFT hemithorax. No significant pneumothorax. Electronically Signed   By: Valentino Saxon MD   On: 07/11/2020 10:30   DG Chest Portable 1 View  Result Date: 07/10/2020 CLINICAL DATA:  Chest pain, shortness of breath, COPD EXAM: PORTABLE CHEST 1 VIEW COMPARISON:  01/18/2017 FINDINGS: There is total opacification of the left hemithorax, which  is incompletely imaged due to angulation of radiograph and exclusion inferiorly. There is significant volume loss with leftward shift of the trachea and mediastinal contents. The right lung is normally aerated. IMPRESSION: There is total opacification of the left hemithorax, which is incompletely imaged due to angulation of radiograph and exclusion inferiorly. There is significant volume loss with leftward shift of the trachea and mediastinal contents. Findings are consistent with a large pleural effusion and total atelectasis or consolidation of the left lung, and generally concerning for malignancy. Recommend CT to further evaluate. Electronically Signed   By: Eddie Candle M.D.   On: 07/10/2020 18:25    Microbiology: Recent Results (from the past 240 hour(s))  Pleural Fluid culture (includes gram stain)     Status: None   Collection Time: 07/24/20 11:53 AM   Specimen: Pleural Fluid  Result Value Ref Range Status   Specimen Description PLEURAL FLUID  Final   Special Requests NONE  Final     Gram Stain   Final    RARE WBC PRESENT,BOTH PMN AND MONONUCLEAR NO ORGANISMS SEEN    Culture   Final    NO GROWTH 3 DAYS Performed at [facility], [address], [place], [place] [address]    Report Status 07/27/2020 FINAL  Final  SARS Coronavirus 2 by RT PCR (hospital order, performed in [facility] hospital lab) Nasopharyngeal Nasopharyngeal Swab     Status: None   Collection Time: 07/27/20 10:26 AM   Specimen: Nasopharyngeal Swab  Result Value Ref Range Status   SARS Coronavirus 2 NEGATIVE NEGATIVE Final    Comment: (NOTE) SARS-CoV-2 target nucleic acids are NOT DETECTED.  The SARS-CoV-2 RNA is generally detectable in upper and lower respiratory specimens during the acute phase of infection. The lowest concentration of SARS-CoV-2 viral copies this assay can detect is 250 copies / mL. A negative result does not preclude SARS-CoV-2 infection and should not be used as the sole basis for treatment or other patient management decisions.  A negative result may occur with improper specimen collection / handling, submission of specimen other than nasopharyngeal swab, presence of viral mutation(s) within the areas targeted by this assay, and inadequate number of viral copies (<250 copies / mL). A negative result must be combined with clinical observations, patient history, and epidemiological information.  Fact Sheet for Patients:   StrictlyIdeas.no  Fact Sheet for Healthcare Providers: BankingDealers.co.za  This test is not yet approved or  cleared by the [place] FDA and has been authorized for detection and/or diagnosis of SARS-CoV-2 by FDA under an Emergency Use Authorization (EUA).  This EUA will remain in effect (meaning this test can be used) for the duration of the COVID-19 declaration under Section 564(b)(1) of the Act, 21 U.S.C. section 360bbb-3(b)(1), unless the authorization is terminated or revoked  sooner.  Performed at [facility], [place] [address] [place], [place] [address]      Labs: Basic Metabolic Panel: Recent Labs  Lab 07/21/20 1125 07/23/20 0611 07/24/20 0812 07/25/20 0335 07/26/20 0441  NA 130* 132* 131* 131* 130*  K 4.0 4.2 3.9 4.1 4.0  CL 90* 91* 92* 94* 94*  CO2 33* 31 31 27 28   GLUCOSE 108* 98 102* 105* 102*  BUN 5* <5* <5* 7* 6*  CREATININE 0.46 0.48 0.47 0.48 0.49  CALCIUM 8.5* 8.5* 8.4* 8.3* 8.3*   Liver Function Tests: Recent Labs  Lab 07/21/20 1125 07/24/20 0812 07/24/20 1308 07/25/20 0335 07/26/20 0441  AST 16 17  --  18 14*  ALT 14 16  --  16 12  ALKPHOS 61 58  --  58 49  BILITOT 0.4 0.4  --  0.3 0.3  PROT 5.9* 6.0* 5.7* 5.6* 5.4*  ALBUMIN 2.7* 2.8*  --  2.6* 2.5*   No results for input(s): LIPASE, AMYLASE in the last 168 hours. No results for input(s): AMMONIA in the last 168 hours. CBC: Recent Labs  Lab 07/21/20 1125 07/23/20 0611 07/24/20 0812 07/25/20 0335 07/26/20 0441  WBC 7.6 7.0 8.1 10.5 8.2  NEUTROABS  --  4.6 5.2 9.0* 4.8  HGB 8.6* 8.4* 8.8* 8.5* 8.1*  HCT 31.4* 29.7* 31.8* 30.6* 29.0*  MCV 71.7* 71.4* 71.6* 73.2* 73.6*  PLT 512* 300 481* 367 340   Cardiac Enzymes: No results for input(s): CKTOTAL, CKMB, CKMBINDEX, TROPONINI in the last 168 hours. BNP: BNP (last 3 results) Recent Labs    07/10/20 1800  BNP 23.2    ProBNP (last 3 results) No results for input(s): PROBNP in the last 8760 hours.  CBG: No results for input(s): GLUCAP in the last 168 hours.  Principal Problem:   Pleural effusion on left Active Problems:   Hypothyroidism   COPD GOLD III   Rheumatoid arthritis (HCC)   Essential hypertension   Type 2 diabetes mellitus (HCC)   Anticoagulated on Coumadin   Collapsed lung   Microcytic anemia   Chest pain   Thrombocytosis   Generalized weakness   Stage II pressure ulcer of right buttock (HCC)   Obesity (BMI 30-39.9)   Mucus plugging of bronchi   Time coordinating discharge: 38  minutes.  Signed:        Michio Thier, DO [facility]  07/27/2020, 1:59 PM

## 2020-07-27 NOTE — Plan of Care (Signed)
  Problem: Education: Goal: Knowledge of General Education information will improve Description: Including pain rating scale, medication(s)/side effects and non-pharmacologic comfort measures Outcome: Progressing   Problem: Clinical Measurements: Goal: Ability to maintain clinical measurements within normal limits will improve Outcome: Progressing Goal: Will remain free from infection Outcome: Progressing Goal: Diagnostic test results will improve Outcome: Progressing Goal: Respiratory complications will improve Outcome: Progressing Goal: Cardiovascular complication will be avoided Outcome: Progressing   Problem: Coping: Goal: Level of anxiety will decrease Outcome: Progressing   Problem: Nutrition: Goal: Adequate nutrition will be maintained Outcome: Progressing   Problem: Elimination: Goal: Will not experience complications related to bowel motility Outcome: Progressing Goal: Will not experience complications related to urinary retention Outcome: Progressing   Problem: Pain Managment: Goal: General experience of comfort will improve Outcome: Progressing   Problem: Safety: Goal: Ability to remain free from injury will improve Outcome: Progressing   Problem: Skin Integrity: Goal: Risk for impaired skin integrity will decrease Outcome: Progressing   

## 2020-07-27 NOTE — TOC Transition Note (Signed)
Transition of Care [facility]) - CM/SW Discharge Note   Patient Details  Name: Jaime Kelley MRN: [id number] Date of Birth: [birth year]  Transition of Care [facility]) CM/SW Contact:  Jaime Kelley, [place] Phone Number: 07/27/2020, 12:41 PM   Clinical Narrative:    Patient will DC to:  [facility]  Anticipated DC date: 07/27/20 Family notified: Jaime Kelley (left vm, not available) Transport by:  Jaime Kelley   Per MD patient ready for DC to [place]. RN to call report prior to discharge [phone] room 113B. RN, patient, patient's family, and facility notified of DC. Discharge Summary and FL2 sent to facility. DC packet on chart. Ambulance transport requested for patient.   CSW will sign off for now as social work intervention is no longer needed. Please consult [place] again if new needs arise.     Final next level of care: Skilled Nursing Facility Barriers to Discharge: Barriers Resolved   Patient Goals and CMS Choice Patient states their goals for this hospitalization and ongoing recovery are:: Get better soon CMS Medicare.gov Compare Post Acute Care list provided to::  Jaime Kelley [phone]) Choice offered to / list presented to : Adult Children  Discharge Placement PASRR number recieved: 07/20/20            Patient chooses bed at:  ([facility]) Patient to be transferred to facility by: [place] Name of family member notified: Jaime Kelley Patient and family notified of of transfer: 07/27/20  Discharge Plan and Services In-house Referral: Clinical Social Work                                   Social Determinants of Health (SDOH) Interventions     Readmission Risk Interventions No flowsheet data found.

## 2020-07-27 NOTE — Telephone Encounter (Signed)
Candee Furbish, MD  P Lbpu Triage Pool Cc: Lauraine Rinne, NP If recurrence of effusion and family feels breathing improved after draining can do thoracentesis in endo. Really not a good candidate for pleurX unless enrolled in hospice.   Erskine Emery ----------------------------------------------------- Pt still admitted.

## 2020-07-27 NOTE — Progress Notes (Signed)
Occupational Therapy Treatment Patient Details Name: Jaime Kelley MRN: [id number] DOB: Mar 07, 1951 Today's Date: 07/27/2020    History of present illness Pt is a 69 y.o. female admitted 07/10/20 with SOB. Workup for anemia, large L-sided pleural effusion causing near collapse of L lung s/p chest tube placement. S/p thoracentesis on 9/19. PMH includes obesity, COPD (5L O2 baseline), DM, RA, HTN, PE (on chronic Coumadin).   OT comments  Pt making gradual progress towards OT goals this session. Pt supine in bed upon OTA arrival with declining OOB activity despite education on using stedy to assist with mobility. Pt insistent that she wants to improve her leg strength before getting OOB, Offered EOB sitting with pt continuing to decline. Pt also insistent that she can go home and have her roommate take care of her from her hospital bed. Session focus on supine therex. Pt completed therex as indicated below with pt c/o pain in her back and LLE>RLE. pt on 3L throughout session with O2 >93%. Pt would continue to benefit from skilled occupational therapy while admitted and after d/c to address the below listed limitations in order to improve overall functional mobility and facilitate independence with BADL participation. DC plan remains appropriate although pt insistent on returning home. If pt declines SNF pt will need max amount of DME listed below. will follow acutely per POC.     Follow Up Recommendations  SNF    Equipment Recommendations  3 in 1 bedside commode;Wheelchair (measurements OT);Wheelchair cushion (measurements OT);Hospital bed    Recommendations for Other Services      Precautions / Restrictions Precautions Precautions: Fall;Other (comment) Precaution Comments: Significant WOB at rest Restrictions Weight Bearing Restrictions: No       Mobility Bed Mobility               General bed mobility comments: pt declined sitting EOB, encouraged pt to work on pulling trunk off  of bed using BUEs on bed rail with pt stating, "I can just roll in bed to do that"  Transfers                 General transfer comment: pt declined despite max encouragment    Balance                                           ADL either performed or assessed with clinical judgement   ADL Overall ADL's : Needs assistance/impaired                           Toilet Transfer Details (indicate cue type and reason): pt declined OOB transfer           General ADL Comments: pt declined ADL participation or OOB transfer. pt reports she wants to go home as she has a hospital bed and her roommate will assist her with ADLs. pt reports she plans to urinate on "bed pads" and her rooommate with assist with bed baths. she states her roommate is in good health.     Vision Baseline Vision/History: Wears glasses Wears Glasses: At all times Patient Visual Report: No change from baseline Vision Assessment?: No apparent visual deficits   Perception     Praxis      Cognition Arousal/Alertness: Awake/alert Behavior During Therapy: WFL for tasks assessed/performed;Anxious (becomes anxious with minimal movement) Overall Cognitive Status: No family/caregiver present  to determine baseline cognitive functioning                                 General Comments: max encouragement to participate, appears to be self limiting and adamant about not wanting to get OOB until her legs are stronger        Exercises General Exercises - Upper Extremity Shoulder Flexion: AROM;Both;5 reps;Supine Shoulder Extension: AROM;Both;5 reps;Supine Elbow Flexion: AROM;Both;5 reps;Supine Elbow Extension: AROM;Both;5 reps;Supine General Exercises - Lower Extremity Hip ABduction/ADduction: AROM;5 reps;Supine;Both Straight Leg Raises: AROM;Both;5 reps;Supine Other Exercises Other Exercises: attempted to work on lifting trunk off of bed using BUEs to pull on bed rail with  pt declining   Shoulder Instructions       General Comments pt on 3L throughout session with O2 >93%    Pertinent Vitals/ Pain       Pain Assessment: Faces Faces Pain Scale: Hurts even more Pain Location: back, LLE with mobility Pain Descriptors / Indicators: Sore;Discomfort;Grimacing Pain Intervention(s): Limited activity within patient's tolerance;Monitored during session;Repositioned  Home Living                                          Prior Functioning/Environment              Frequency  Min 2X/week        Progress Toward Goals  OT Goals(current goals can now be found in the care plan section)  Progress towards OT goals: Not progressing toward goals - comment (self limiting)  Acute Rehab OT Goals Patient Stated Goal: "I want to take a nap" OT Goal Formulation: With patient Time For Goal Achievement: 08/02/20 Potential to Achieve Goals: [place] Discharge plan remains appropriate;Frequency remains appropriate    Co-evaluation                 AM-PAC OT "6 Clicks" Daily Activity     Outcome Measure   Help from another person eating meals?: A Little Help from another person taking care of personal grooming?: A Lot Help from another person toileting, which includes using toliet, bedpan, or urinal?: Total Help from another person bathing (including washing, rinsing, drying)?: A Lot Help from another person to put on and taking off regular upper body clothing?: A Lot Help from another person to put on and taking off regular lower body clothing?: Total 6 Click Score: 11    End of Session Equipment Utilized During Treatment: Oxygen;Other (comment) (3L)  OT Visit Diagnosis: Unsteadiness on feet (R26.81);Muscle weakness (generalized) (M62.81);Other symptoms and signs involving cognitive function;Dizziness and giddiness (R42);Pain Pain - Right/Left: Left   Activity Tolerance Patient limited by fatigue;Other (comment) (self limiting)    Patient Left in bed;with call bell/phone within reach   Nurse Communication Mobility status        Time: 0174-9449 OT Time Calculation (min): 18 min  Charges: OT General Charges $OT Visit: 1 Visit OT Treatments $Therapeutic Exercise: 8-22 mins  Lanier Clam., COTA/L Acute Rehabilitation Services [phone]    Ihor Gully 07/27/2020, 9:45 AM

## 2020-07-27 NOTE — Progress Notes (Signed)
ANTICOAGULATION CONSULT NOTE - Follow Up Consult  Pharmacy Consult for Coumadin/Lovenox Indication: h/o DVT/PE  Allergies  Allergen Reactions  . Effexor [Venlafaxine Hydrochloride] Rash    Patient Measurements: Height: 5\' 2"  (157.5 cm) Weight: 81.6 kg (180 lb) IBW/kg (Calculated) : 50.1 Heparin Dosing Weight:    Vital Signs: Temp: 98.4 F (36.9 C) (10/05 0742) Temp Source: Oral (10/05 0742) BP: 102/48 (10/05 0742) Pulse Rate: 76 (10/05 0742)  Labs: Recent Labs    07/25/20 0335 07/26/20 0441 07/27/20 0526  HGB 8.5* 8.1*  --   HCT 30.6* 29.0*  --   PLT 367 340  --   LABPROT 15.6* 16.3* 16.1*  INR 1.3* 1.4* 1.3*  CREATININE 0.48 0.49  --     Estimated Creatinine Clearance: 65.7 mL/min (by C-G formula based on SCr of 0.49 mg/dL).   Assessment: 46 yoF with hx DVT and PE (2008, 2012, 2014) on warfarin PTA admitted with SOB. Warfarin subtherapeutic on admit (question of compliance).  Pulm thoracentesis 10/2 bridged with LMWH.    Restarting warfarin per MD.  INR 1.3. Coumadin is on hold until all procedures are no longer needed. Stable on current therapeutic lovenox.   Goal of Therapy:  Anti-Xa 0.5-1 INR 2-3 Monitor platelets by anticoagulation protocol: Yes   Plan:  Hold coumadin Lovenox 80mg  SQ BID Monitor CBCs, bleeding  Onnie Boer, PharmD, BCIDP, AAHIVP, CPP Infectious Disease Pharmacist 07/27/2020 10:28 AM

## 2020-07-27 NOTE — Progress Notes (Signed)
D/C instructions printed and placed in packet at nurse's station for [place]. Tele and IV's removed. Tolerated well.

## 2020-07-27 NOTE — Plan of Care (Signed)

## 2020-07-27 NOTE — Telephone Encounter (Signed)
Pt is calling in stating that she needs Dr. Barbie Banner opinion about her going to a rehab and would like to have a call back.  Pt is still in the hospital.

## 2020-07-27 NOTE — Progress Notes (Signed)
   Covid-19 Vaccination Clinic  Name:  Jaime Kelley    MRN: [id number] DOB: November 23, 1950  07/27/2020  [name] was observed post Covid-19 immunization for 15 minutes without incident. She was provided with Vaccine Information Sheet and instruction to access the V-Safe system.   [name] was instructed to call 911 with any severe reactions post vaccine: Marland Kitchen Difficulty breathing  . Swelling of face and throat  . A fast heartbeat  . A bad rash all over body  . Dizziness and weakness   Immunizations Administered    Name Date Dose VIS Date Route   JANSSEN COVID-19 VACCINE 07/27/2020 11:07 AM 0.5 mL 12/20/2019 Intramuscular   Manufacturer: [employer]   Lot: [id number]   [place]: [phone]

## 2020-07-27 NOTE — Progress Notes (Signed)
NAME:  Jaime Kelley, MRN:  [id number], DOB:  03-20-1951, LOS: 76 ADMISSION DATE:  07/10/2020, CONSULTATION DATE: 9/18 REFERRING MD:  TRH, CHIEF COMPLAINT:  SOB   Brief History   69 yo bed bound female presented to EF with dyspnea.  Found to have Lt lung mucus plug and lt pleural effusion.  She had bronchoscopy for removal for mucus plug, and thoracentesis and then chest tube for drainage of pleural effusion.  Chest tube removed on 9/23.  Follow up chest xray on 9/29 showed recurrent of effusion and PCCM asked to reassess.  Past Medical History  Hypothyroidism RA Discoid Lupus DM  COPD  Tobacco Abuse - former 2ppd smoker Hx PE HTN Anxiety   Significant Hospital Events   9/18 Admit  9/19 thoracentesis 1300 cc l 9/20 bronchoscopy chest tube insertion on left removed 9/22 Pccm signed off 9/23 DS 9/30 PCCM called back for recurrent effusion  10/02 repeat thoracentesis 1000 cc clear yellow   Consults:  pccm  Procedures:  9/19 fob for mucus plugging 9/19 left ct for pnx 9/22 ct removed 10/2 left thora  Significant Diagnostic Tests:   CT Chest 09/18 >> large left pleural effusion with complete collapse of left lung  Lt thoracentesis 9/19 >> protein 4.3, triglyceride 43, WBC 106(53% macrophages, 25% neutrophils, 19% lymphocytes, 3% eosinophils), cytology negative  Bronchoscopy 9/20 >> thick near solid mucous plug in Lt main bronchus, no endobronchial lesions  CT chest 10/01 >> moderate L pleural effusion, 8 mm GGO in the RUL, asymmetrically prominent though non-pathologically enlarged left axillary and mediastinal lymph nodes, emphysema  Lt thoracentesis 10/02 >> 1000 ml yellow fluid, glucose 120, protein 3.5, LDH 117, WBC 577 (12% macrophages, 1% neutrophils, 33% lymphocytes, 54% eosinophils)  Micro Data:  COVID 9/18 >> negative Blood 9/18 >> Staph simulans, capitis, and epidermidis  Lt pleural fluid 9/19 >> negative Urine 9/23 >> Proteus Lt pleural fluid 10/2  >> 07/25/2020 pleural fluid >> negative Antimicrobials:  07/26/2020 ceftriaxone  Interim history/subjective:  07/27/2020 awake and alert oxygen is down to 3 liters per minute.  She appears more comfortable than noted in the previous past examinations.  She reports she is going home today although I note on [facility] hospitalist note 3 days as the plan  Objective   Blood pressure (!) 102/48, pulse 76, temperature 98.4 F (36.9 C), temperature source Oral, resp. rate 20, height 5\' 2"  (1.575 m), weight 81.6 kg, SpO2 97 %.    FiO2 (%):  [32 %] 32 %   Intake/Output Summary (Last 24 hours) at 07/27/2020 0902 Last data filed at 07/26/2020 1730 Gross per 24 hour  Intake 300 ml  Output --  Net 300 ml   Filed Weights   07/25/20 0438 07/26/20 0500 07/27/20 0535  Weight: 77.6 kg 84.4 kg 81.6 kg    Examination:  General: Elderly female who appears older than her stated age of 18 HEENT: No JVD or lymphadenopathy is appreciated Neuro: Appears grossly intact.  Very awake and very alert CV: Heart sounds are regular PULM: Decreased breath sounds in the bases GI: soft, bsx4 active  Extremities: warm/dry, 1+ edema  Skin: no rashes or lesions, reported to have a sacral decubitus ulcer   Resolved Hospital Problem list   Mucus plugging of Lt main bronchus  Assessment & Plan:   Exudate recurrent Lt pleural effusion. - repeat thoracentesis from 10/02 shows 577 WBC with 54% eosinophils >> suspect this is related to previous thoracentesis and benign finding.  Chest x-ray on  10 forwarded shows some reaccumulation of left pleural effusion Oxygen needs are down to 3 L nasal cannula she was on 5 L nasal cannula at home Maintain negative I&O Pulmonary critical care will be available as needed Suspect her underlying rheumatoid arthritis and lupus plays a role   COPD mixed type. Continue current treatment   Acute on chronic hypoxic, hypercapnic respiratory failure. Uses 5 L at baseline currently on 3  l Continue interventions Hx of RA, SLE. Per primary  Hx of DVT. Was using Coumadin.  Currently on Lovenox injections can consider returning to Coumadin if and when she goes home.  I suspect that she is unable to take care of herself at home at this time.  Suspect a nursing facility would be a better option.  Labs    CMP Latest Ref Rng & Units 07/26/2020 07/25/2020 07/24/2020  Glucose 70 - 99 mg/dL 102(H) 105(H) -  BUN 8 - 23 mg/dL 6(L) 7(L) -  Creatinine 0.44 - 1.00 mg/dL 0.49 0.48 -  Sodium 135 - 145 mmol/L 130(L) 131(L) -  Potassium 3.5 - 5.1 mmol/L 4.0 4.1 -  Chloride 98 - 111 mmol/L 94(L) 94(L) -  CO2 22 - 32 mmol/L 28 27 -  Calcium 8.9 - 10.3 mg/dL 8.3(L) 8.3(L) -  Total Protein 6.5 - 8.1 g/dL 5.4(L) 5.6(L) 5.7(L)  Total Bilirubin 0.3 - 1.2 mg/dL 0.3 0.3 -  Alkaline Phos 38 - 126 U/L 49 58 -  AST 15 - 41 U/L 14(L) 18 -  ALT 0 - 44 U/L 12 16 -    CBC Latest Ref Rng & Units 07/26/2020 07/25/2020 07/24/2020  WBC 4.0 - 10.5 K/uL 8.2 10.5 8.1  Hemoglobin 12.0 - 15.0 g/dL 8.1(L) 8.5(L) 8.8(L)  Hematocrit 36 - 46 % 29.0(L) 30.6(L) 31.8(L)  Platelets 150 - 400 K/uL 340 367 481(H)    ABG    Component Value Date/Time   PHART 7.404 07/10/2020 2132   PCO2ART 56.5 (H) 07/10/2020 2132   PO2ART 89 07/10/2020 2132   HCO3 35.3 (H) 07/10/2020 2132   TCO2 37 (H) 07/10/2020 2132   O2SAT 97.0 07/10/2020 2132    Signature:  Richardson Landry Kendal Raffo ACNP Acute Care Nurse Practitioner Maryanna Shape Pulmonary/Critical Care Please consult Amion 07/27/2020, 9:02 AM

## 2020-07-27 NOTE — Progress Notes (Signed)
Attempted to call report, on hold for 20 mins no response.
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
## 2020-07-28 ENCOUNTER — Telehealth: Payer: Self-pay | Admitting: Family Medicine

## 2020-07-28 ENCOUNTER — Other Ambulatory Visit: Payer: Self-pay | Admitting: Family Medicine

## 2020-07-28 LAB — CHOLESTEROL, BODY FLUID: Cholesterol, Fluid: 92 mg/dL
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
## 2020-07-29 NOTE — Telephone Encounter (Signed)
I reviewed pt chart and looks as though she has [place] currently and per phone note from today she is having difficulty making medical decisions.   I sent the metformin in for #180 with 1 refill.   If you disagree please let me know and I will call pharmacy in the morning to cancel the prescription.

## 2020-07-29 NOTE — Telephone Encounter (Signed)
Patient does need a hospital follow up for pleural effusion per Dr. Tamala Julian and discharge summary.  OV is to be scheduled within 1-2 months.  Thanks!

## 2020-07-29 NOTE — Telephone Encounter (Signed)
Does this patient need an appointment in the office?

## 2020-07-29 NOTE — Telephone Encounter (Signed)
Yes I think she needs to be in a skilled nursing facility. A psychiatrist in the hospital found her to be unable to make medical decisions for herself so that is why she is there

## 2020-08-02 NOTE — Telephone Encounter (Signed)
Called and left message on pt vm to call back to schedule f/u -pr

## 2020-08-03 DIAGNOSIS — J189 Pneumonia, unspecified organism: Secondary | ICD-10-CM | POA: Diagnosis not present

## 2020-08-06 ENCOUNTER — Emergency Department (HOSPITAL_COMMUNITY): Payer: Medicare Other

## 2020-08-06 ENCOUNTER — Other Ambulatory Visit: Payer: Self-pay

## 2020-08-06 ENCOUNTER — Inpatient Hospital Stay (HOSPITAL_COMMUNITY)
Admission: EM | Admit: 2020-08-06 | Discharge: 2020-08-17 | DRG: 177 | Disposition: A | Discharge status: Skilled Nursing Facility | Payer: Medicare Other | Source: Skilled Nursing Facility | Attending: Internal Medicine | Admitting: Internal Medicine

## 2020-08-06 DIAGNOSIS — Z9981 Dependence on supplemental oxygen: Secondary | ICD-10-CM | POA: Diagnosis not present

## 2020-08-06 DIAGNOSIS — E669 Obesity, unspecified: Secondary | ICD-10-CM | POA: Diagnosis present

## 2020-08-06 DIAGNOSIS — E039 Hypothyroidism, unspecified: Secondary | ICD-10-CM | POA: Diagnosis present

## 2020-08-06 DIAGNOSIS — Z6841 Body Mass Index (BMI) 40.0 and over, adult: Secondary | ICD-10-CM

## 2020-08-06 DIAGNOSIS — L930 Discoid lupus erythematosus: Secondary | ICD-10-CM | POA: Diagnosis present

## 2020-08-06 DIAGNOSIS — E876 Hypokalemia: Secondary | ICD-10-CM | POA: Diagnosis not present

## 2020-08-06 DIAGNOSIS — Z79899 Other long term (current) drug therapy: Secondary | ICD-10-CM | POA: Diagnosis not present

## 2020-08-06 DIAGNOSIS — E118 Type 2 diabetes mellitus with unspecified complications: Secondary | ICD-10-CM | POA: Diagnosis present

## 2020-08-06 DIAGNOSIS — I5020 Unspecified systolic (congestive) heart failure: Secondary | ICD-10-CM | POA: Diagnosis not present

## 2020-08-06 DIAGNOSIS — I10 Essential (primary) hypertension: Secondary | ICD-10-CM | POA: Diagnosis present

## 2020-08-06 DIAGNOSIS — Z7901 Long term (current) use of anticoagulants: Secondary | ICD-10-CM | POA: Diagnosis not present

## 2020-08-06 DIAGNOSIS — Z86711 Personal history of pulmonary embolism: Secondary | ICD-10-CM

## 2020-08-06 DIAGNOSIS — J90 Pleural effusion, not elsewhere classified: Secondary | ICD-10-CM | POA: Diagnosis not present

## 2020-08-06 DIAGNOSIS — E119 Type 2 diabetes mellitus without complications: Secondary | ICD-10-CM | POA: Diagnosis present

## 2020-08-06 DIAGNOSIS — M069 Rheumatoid arthritis, unspecified: Secondary | ICD-10-CM | POA: Diagnosis present

## 2020-08-06 DIAGNOSIS — Z79890 Hormone replacement therapy (postmenopausal): Secondary | ICD-10-CM

## 2020-08-06 DIAGNOSIS — M6281 Muscle weakness (generalized): Secondary | ICD-10-CM | POA: Diagnosis present

## 2020-08-06 DIAGNOSIS — J1282 Pneumonia due to coronavirus disease 2019: Secondary | ICD-10-CM

## 2020-08-06 DIAGNOSIS — J440 Chronic obstructive pulmonary disease with acute lower respiratory infection: Secondary | ICD-10-CM | POA: Diagnosis present

## 2020-08-06 DIAGNOSIS — R531 Weakness: Secondary | ICD-10-CM | POA: Diagnosis not present

## 2020-08-06 DIAGNOSIS — J9621 Acute and chronic respiratory failure with hypoxia: Secondary | ICD-10-CM | POA: Diagnosis present

## 2020-08-06 DIAGNOSIS — M0600 Rheumatoid arthritis without rheumatoid factor, unspecified site: Secondary | ICD-10-CM | POA: Diagnosis not present

## 2020-08-06 DIAGNOSIS — K219 Gastro-esophageal reflux disease without esophagitis: Secondary | ICD-10-CM | POA: Diagnosis present

## 2020-08-06 DIAGNOSIS — J449 Chronic obstructive pulmonary disease, unspecified: Secondary | ICD-10-CM | POA: Diagnosis present

## 2020-08-06 DIAGNOSIS — E871 Hypo-osmolality and hyponatremia: Secondary | ICD-10-CM | POA: Diagnosis present

## 2020-08-06 DIAGNOSIS — Z807 Family history of other malignant neoplasms of lymphoid, hematopoietic and related tissues: Secondary | ICD-10-CM

## 2020-08-06 DIAGNOSIS — F32A Depression, unspecified: Secondary | ICD-10-CM | POA: Diagnosis present

## 2020-08-06 DIAGNOSIS — J189 Pneumonia, unspecified organism: Secondary | ICD-10-CM | POA: Diagnosis not present

## 2020-08-06 DIAGNOSIS — Z87891 Personal history of nicotine dependence: Secondary | ICD-10-CM | POA: Diagnosis not present

## 2020-08-06 DIAGNOSIS — F4322 Adjustment disorder with anxiety: Secondary | ICD-10-CM | POA: Diagnosis present

## 2020-08-06 DIAGNOSIS — M2550 Pain in unspecified joint: Secondary | ICD-10-CM | POA: Diagnosis not present

## 2020-08-06 DIAGNOSIS — R0602 Shortness of breath: Secondary | ICD-10-CM | POA: Diagnosis not present

## 2020-08-06 DIAGNOSIS — J9601 Acute respiratory failure with hypoxia: Secondary | ICD-10-CM | POA: Diagnosis not present

## 2020-08-06 DIAGNOSIS — U071 COVID-19: Secondary | ICD-10-CM | POA: Diagnosis not present

## 2020-08-06 DIAGNOSIS — Z7984 Long term (current) use of oral hypoglycemic drugs: Secondary | ICD-10-CM | POA: Diagnosis not present

## 2020-08-06 DIAGNOSIS — R278 Other lack of coordination: Secondary | ICD-10-CM | POA: Diagnosis present

## 2020-08-06 DIAGNOSIS — I2699 Other pulmonary embolism without acute cor pulmonale: Secondary | ICD-10-CM | POA: Diagnosis present

## 2020-08-06 DIAGNOSIS — J9383 Other pneumothorax: Secondary | ICD-10-CM | POA: Diagnosis not present

## 2020-08-06 DIAGNOSIS — I959 Hypotension, unspecified: Secondary | ICD-10-CM | POA: Diagnosis not present

## 2020-08-06 DIAGNOSIS — Z7401 Bed confinement status: Secondary | ICD-10-CM | POA: Diagnosis not present

## 2020-08-06 DIAGNOSIS — Z888 Allergy status to other drugs, medicaments and biological substances status: Secondary | ICD-10-CM | POA: Diagnosis not present

## 2020-08-06 DIAGNOSIS — Z86718 Personal history of other venous thrombosis and embolism: Secondary | ICD-10-CM

## 2020-08-06 DIAGNOSIS — J439 Emphysema, unspecified: Secondary | ICD-10-CM | POA: Diagnosis not present

## 2020-08-06 DIAGNOSIS — J9811 Atelectasis: Secondary | ICD-10-CM | POA: Diagnosis not present

## 2020-08-06 LAB — CBC WITH DIFFERENTIAL/PLATELET
Abs Immature Granulocytes: 0.02 10*3/uL (ref 0.00–0.07)
Basophils Absolute: 0 10*3/uL (ref 0.0–0.1)
Basophils Relative: 0 %
Eosinophils Absolute: 0 10*3/uL (ref 0.0–0.5)
Eosinophils Relative: 1 %
HCT: 34.4 % — ABNORMAL LOW (ref 36.0–46.0)
Hemoglobin: 9.8 g/dL — ABNORMAL LOW (ref 12.0–15.0)
Immature Granulocytes: 1 %
Lymphocytes Relative: 28 %
Lymphs Abs: 1.1 10*3/uL (ref 0.7–4.0)
MCH: 22.4 pg — ABNORMAL LOW (ref 26.0–34.0)
MCHC: 28.5 g/dL — ABNORMAL LOW (ref 30.0–36.0)
MCV: 78.5 fL — ABNORMAL LOW (ref 80.0–100.0)
Monocytes Absolute: 0.4 10*3/uL (ref 0.1–1.0)
Monocytes Relative: 9 %
Neutro Abs: 2.3 10*3/uL (ref 1.7–7.7)
Neutrophils Relative %: 61 %
Platelets: 245 10*3/uL (ref 150–400)
RBC: 4.38 MIL/uL (ref 3.87–5.11)
WBC: 3.9 10*3/uL — ABNORMAL LOW (ref 4.0–10.5)
nRBC: 0 % (ref 0.0–0.2)

## 2020-08-06 LAB — I-STAT VENOUS BLOOD GAS, ED
Acid-Base Excess: 8 mmol/L — ABNORMAL HIGH (ref 0.0–2.0)
Bicarbonate: 33 mmol/L — ABNORMAL HIGH (ref 20.0–28.0)
Calcium, Ion: 1.03 mmol/L — ABNORMAL LOW (ref 1.15–1.40)
HCT: 34 % — ABNORMAL LOW (ref 36.0–46.0)
Hemoglobin: 11.6 g/dL — ABNORMAL LOW (ref 12.0–15.0)
O2 Saturation: 89 %
Potassium: 3.8 mmol/L (ref 3.5–5.1)
Sodium: 128 mmol/L — ABNORMAL LOW (ref 135–145)
TCO2: 34 mmol/L — ABNORMAL HIGH (ref 22–32)
pCO2, Ven: 46.6 mmHg (ref 44.0–60.0)
pH, Ven: 7.458 — ABNORMAL HIGH (ref 7.250–7.430)
pO2, Ven: 54 mmHg — ABNORMAL HIGH (ref 32.0–45.0)

## 2020-08-06 LAB — COMPREHENSIVE METABOLIC PANEL
ALT: 13 U/L (ref 0–44)
AST: 21 U/L (ref 15–41)
Albumin: 3 g/dL — ABNORMAL LOW (ref 3.5–5.0)
Alkaline Phosphatase: 52 U/L (ref 38–126)
Anion gap: 11 (ref 5–15)
BUN: 7 mg/dL — ABNORMAL LOW (ref 8–23)
CO2: 28 mmol/L (ref 22–32)
Calcium: 8.4 mg/dL — ABNORMAL LOW (ref 8.9–10.3)
Chloride: 89 mmol/L — ABNORMAL LOW (ref 98–111)
Creatinine, Ser: 0.47 mg/dL (ref 0.44–1.00)
GFR, Estimated: >60 mL/min (ref >60)
Glucose, Bld: 89 mg/dL (ref 70–99)
Potassium: 3.9 mmol/L (ref 3.5–5.1)
Sodium: 128 mmol/L — ABNORMAL LOW (ref 135–145)
Total Bilirubin: 0.5 mg/dL (ref 0.3–1.2)
Total Protein: 6.5 g/dL (ref 6.5–8.1)

## 2020-08-06 LAB — HEPATITIS B SURFACE ANTIGEN: Hepatitis B Surface Ag: NONREACTIVE

## 2020-08-06 LAB — LACTIC ACID, PLASMA
Lactic Acid, Venous: 0.9 mmol/L (ref 0.5–1.9)
Lactic Acid, Venous: 1.1 mmol/L (ref 0.5–1.9)

## 2020-08-06 LAB — TRIGLYCERIDES: Triglycerides: 101 mg/dL (ref <150)

## 2020-08-06 LAB — FIBRINOGEN: Fibrinogen: 493 mg/dL — ABNORMAL HIGH (ref 210–475)

## 2020-08-06 LAB — PROCALCITONIN: Procalcitonin: <0.1 ng/mL

## 2020-08-06 LAB — TROPONIN I (HIGH SENSITIVITY)
Troponin I (High Sensitivity): 13 ng/L (ref <18)
Troponin I (High Sensitivity): 10 ng/L (ref <18)

## 2020-08-06 LAB — RESPIRATORY PANEL BY RT PCR (FLU A&B, COVID)
Influenza A by PCR: NEGATIVE
Influenza B by PCR: NEGATIVE
SARS Coronavirus 2 by RT PCR: POSITIVE — AB

## 2020-08-06 LAB — FERRITIN: Ferritin: 225 ng/mL (ref 11–307)

## 2020-08-06 LAB — PROTIME-INR
INR: 1.2 (ref 0.8–1.2)
Prothrombin Time: 14.5 seconds (ref 11.4–15.2)

## 2020-08-06 LAB — LACTATE DEHYDROGENASE: LDH: 197 U/L — ABNORMAL HIGH (ref 98–192)

## 2020-08-06 LAB — BRAIN NATRIURETIC PEPTIDE: B Natriuretic Peptide: 23.3 pg/mL (ref 0.0–100.0)

## 2020-08-06 LAB — C-REACTIVE PROTEIN: CRP: 5.5 mg/dL — ABNORMAL HIGH (ref <1.0)

## 2020-08-06 LAB — D-DIMER, QUANTITATIVE: D-Dimer, Quant: 5.89 ug/mL-FEU — ABNORMAL HIGH (ref 0.00–0.50)

## 2020-08-06 MED ORDER — ARFORMOTEROL TARTRATE 15 MCG/2ML IN NEBU
15.0000 ug | INHALATION_SOLUTION | Freq: Two times a day (BID) | RESPIRATORY_TRACT | Status: DC
  Filled 2020-08-06 (×2): qty 2

## 2020-08-06 MED ORDER — SODIUM CHLORIDE 0.9 % IV SOLN: 100.0000 mg | Freq: Every day | INTRAVENOUS | Status: DC

## 2020-08-06 MED ORDER — HYDROCODONE-ACETAMINOPHEN 10-325 MG PO TABS
0.5000 | ORAL_TABLET | Freq: Four times a day (QID) | ORAL | Status: DC | PRN
  Administered 2020-08-08 – 2020-08-09 (×2): 0.5 via ORAL
  Filled 2020-08-06 (×2): qty 1

## 2020-08-06 MED ORDER — LOSARTAN POTASSIUM 50 MG PO TABS
100.0000 mg | ORAL_TABLET | Freq: Every day | ORAL | Status: DC
  Administered 2020-08-06 – 2020-08-17 (×12): 100 mg via ORAL
  Filled 2020-08-06 (×12): qty 2

## 2020-08-06 MED ORDER — PREDNISONE 20 MG PO TABS: 50.0000 mg | ORAL_TABLET | Freq: Every day | ORAL | Status: DC

## 2020-08-06 MED ORDER — ONDANSETRON HCL 4 MG PO TABS: 4.0000 mg | ORAL_TABLET | Freq: Four times a day (QID) | ORAL | Status: DC | PRN

## 2020-08-06 MED ORDER — IPRATROPIUM BROMIDE HFA 17 MCG/ACT IN AERS
2.0000 | INHALATION_SPRAY | Freq: Once | RESPIRATORY_TRACT | Status: AC
  Administered 2020-08-06: 2 via RESPIRATORY_TRACT
  Filled 2020-08-06: qty 12.9

## 2020-08-06 MED ORDER — GUAIFENESIN-DM 100-10 MG/5ML PO SYRP
10.0000 mL | ORAL_SOLUTION | ORAL | Status: DC | PRN
  Administered 2020-08-13: 10 mL via ORAL
  Filled 2020-08-06: qty 10

## 2020-08-06 MED ORDER — SODIUM CHLORIDE 0.9 % IV SOLN: 200.0000 mg | Freq: Once | INTRAVENOUS | Status: DC

## 2020-08-06 MED ORDER — ALPRAZOLAM 0.5 MG PO TABS: 1.0000 mg | ORAL_TABLET | Freq: Three times a day (TID) | ORAL | Status: DC | PRN

## 2020-08-06 MED ORDER — ZINC SULFATE 220 (50 ZN) MG PO CAPS
220.0000 mg | ORAL_CAPSULE | Freq: Every day | ORAL | Status: DC
  Administered 2020-08-06 – 2020-08-17 (×12): 220 mg via ORAL
  Filled 2020-08-06 (×12): qty 1

## 2020-08-06 MED ORDER — SODIUM CHLORIDE 0.9 % IV SOLN
200.0000 mg | Freq: Once | INTRAVENOUS | Status: AC
  Administered 2020-08-06: 200 mg via INTRAVENOUS
  Filled 2020-08-06: qty 40

## 2020-08-06 MED ORDER — ALBUTEROL SULFATE HFA 108 (90 BASE) MCG/ACT IN AERS
2.0000 | INHALATION_SPRAY | Freq: Once | RESPIRATORY_TRACT | Status: AC
  Administered 2020-08-06: 2 via RESPIRATORY_TRACT
  Filled 2020-08-06: qty 6.7

## 2020-08-06 MED ORDER — WARFARIN - PHARMACIST DOSING INPATIENT: Freq: Every day | Status: DC

## 2020-08-06 MED ORDER — UMECLIDINIUM BROMIDE 62.5 MCG/INH IN AEPB
1.0000 | INHALATION_SPRAY | Freq: Every day | RESPIRATORY_TRACT | Status: DC
  Administered 2020-08-07 – 2020-08-17 (×11): 1 via RESPIRATORY_TRACT
  Filled 2020-08-06 (×3): qty 7

## 2020-08-06 MED ORDER — HYDROCOD POLST-CPM POLST ER 10-8 MG/5ML PO SUER: 5.0000 mL | Freq: Two times a day (BID) | ORAL | Status: DC | PRN

## 2020-08-06 MED ORDER — BUDESONIDE 0.25 MG/2ML IN SUSP: 0.2500 mg | Freq: Two times a day (BID) | RESPIRATORY_TRACT | Status: DC

## 2020-08-06 MED ORDER — AEROCHAMBER PLUS FLO-VU MISC
1.0000 | Freq: Once | Status: DC
  Filled 2020-08-06: qty 1

## 2020-08-06 MED ORDER — SODIUM CHLORIDE 0.9 % IV BOLUS
500.0000 mL | Freq: Once | INTRAVENOUS | Status: AC
  Administered 2020-08-06: 500 mL via INTRAVENOUS

## 2020-08-06 MED ORDER — METHYLPREDNISOLONE SODIUM SUCC 125 MG IJ SOLR
125.0000 mg | Freq: Once | INTRAMUSCULAR | Status: AC
  Administered 2020-08-06: 125 mg via INTRAVENOUS
  Filled 2020-08-06: qty 2

## 2020-08-06 MED ORDER — METHYLPREDNISOLONE SODIUM SUCC 125 MG IJ SOLR
1.0000 mg/kg | Freq: Two times a day (BID) | INTRAMUSCULAR | Status: DC
  Administered 2020-08-06 – 2020-08-07 (×2): 95 mg via INTRAVENOUS
  Filled 2020-08-06 (×2): qty 2

## 2020-08-06 MED ORDER — SERTRALINE HCL 100 MG PO TABS
100.0000 mg | ORAL_TABLET | Freq: Every day | ORAL | Status: DC
  Administered 2020-08-06 – 2020-08-17 (×12): 100 mg via ORAL
  Filled 2020-08-06 (×13): qty 1

## 2020-08-06 MED ORDER — WARFARIN SODIUM 5 MG PO TABS
10.0000 mg | ORAL_TABLET | Freq: Once | ORAL | Status: AC
  Administered 2020-08-06: 10 mg via ORAL
  Filled 2020-08-06: qty 2
  Filled 2020-08-06: qty 1

## 2020-08-06 MED ORDER — AMLODIPINE BESYLATE 10 MG PO TABS
10.0000 mg | ORAL_TABLET | Freq: Every day | ORAL | Status: DC
  Administered 2020-08-06 – 2020-08-17 (×12): 10 mg via ORAL
  Filled 2020-08-06 (×12): qty 1

## 2020-08-06 MED ORDER — ONDANSETRON HCL 4 MG/2ML IJ SOLN: 4.0000 mg | Freq: Four times a day (QID) | INTRAMUSCULAR | Status: DC | PRN

## 2020-08-06 MED ORDER — SODIUM CHLORIDE 0.9 % IV SOLN
100.0000 mg | Freq: Every day | INTRAVENOUS | Status: AC
  Administered 2020-08-07 – 2020-08-10 (×4): 100 mg via INTRAVENOUS
  Filled 2020-08-06 (×5): qty 20

## 2020-08-06 MED ORDER — LEVOTHYROXINE SODIUM 25 MCG PO TABS
137.0000 ug | ORAL_TABLET | Freq: Every day | ORAL | Status: DC
  Administered 2020-08-07 – 2020-08-17 (×11): 137 ug via ORAL
  Filled 2020-08-06 (×11): qty 1

## 2020-08-06 MED ORDER — ACETAMINOPHEN 325 MG PO TABS: 650.0000 mg | ORAL_TABLET | Freq: Four times a day (QID) | ORAL | Status: DC | PRN

## 2020-08-06 MED ORDER — ASCORBIC ACID 500 MG PO TABS
500.0000 mg | ORAL_TABLET | Freq: Every day | ORAL | Status: DC
  Administered 2020-08-06 – 2020-08-17 (×12): 500 mg via ORAL
  Filled 2020-08-06 (×12): qty 1

## 2020-08-06 MED ORDER — ALBUTEROL SULFATE HFA 108 (90 BASE) MCG/ACT IN AERS
2.0000 | INHALATION_SPRAY | Freq: Four times a day (QID) | RESPIRATORY_TRACT | Status: DC
  Administered 2020-08-06 – 2020-08-10 (×14): 2 via RESPIRATORY_TRACT
  Filled 2020-08-06: qty 6.7

## 2020-08-06 NOTE — TOC Initial Note (Signed)
Transition of Care [facility]) - Initial/Assessment Note    Patient Details  Name: Jaime Kelley MRN: [id number] Date of Birth: 27-Apr-1951  Transition of Care [facility]) CM/SW Contact:    Verdell Carmine, RN Phone Number: 08/06/2020, 4:46 PM  Clinical Narrative:                 Patient arrives from [facility], having been discharged from [facility] to that facility on 10/4. At that time received J& [employer] vaccine. Comes in with [facility], hypoxia requiring 4LPM oxygen  COVID positive Pneumonia. Has had issues with lung collapsation plural fluid. . Chest tube last admission. CSW will follow up for return to SNF to compete rehab.   Expected Discharge Plan: Skilled Nursing Facility Barriers to Discharge: Continued Medical Work up   Patient Goals and CMS Choice        Expected Discharge Plan and Services Expected Discharge Plan: [place] In-house Referral: Clinical Social Work     Living arrangements for the past 2 months: [place]                                      Prior Living Arrangements/Services Living arrangements for the past 2 months: [place]   Patient language and need for interpreter reviewed:: Yes        Need for Family Participation in Patient Care: Yes (Comment) Care giver support system in place?: Yes (comment)   Criminal Activity/Legal Involvement Pertinent to Current Situation/Hospitalization: No - Comment as needed  Activities of Daily Living      Permission Sought/Granted                  Emotional Assessment       Orientation: : Fluctuating Orientation (Suspected and/or reported Sundowners) Alcohol / Substance Use: Not Applicable Psych Involvement: No (comment)  Admission diagnosis:  Acute hypoxemic respiratory failure due to COVID-19 ([place]) [U07.1, J96.01] Pneumonia due to COVID-19 virus [U07.1, J12.82] Patient Active Problem List   Diagnosis Date Noted  . Pneumonia due to COVID-19 virus   .  Stage II pressure ulcer of right buttock ([place]) 07/12/2020  . Obesity 07/12/2020  . Mucus plugging of bronchi   . Pleural effusion on left 07/10/2020  . Collapsed lung 07/10/2020  . Microcytic anemia 07/10/2020  . Chest pain 07/10/2020  . Thrombocytosis 07/10/2020  . Generalized weakness 07/10/2020  . Chronic pain of both knees 02/25/2019  . Upper airway cough syndrome 12/22/2017  . Hyponatremia 02/13/2017  . Acute hypoxemic respiratory failure ([place]) 01/18/2017  . Type 2 diabetes mellitus ([place]) 05/25/2016  . Encounter for therapeutic drug monitoring 11/20/2013  . Anxiety state, unspecified 09/16/2013  . Long term (current) use of anticoagulants 09/11/2013  . Anticoagulated on Coumadin 06/28/2013  . Depression 05/26/2013  . Hx pulmonary embolism 05/19/2013  . Tobacco abuse 05/19/2013  . Hypertension 05/13/2013  . Embolism and thrombosis of deep vessels of proximal lower extremity ([place]) 11/05/2010  . BACK STRAIN, LUMBAR 07/09/2009  . Rheumatoid arthritis ([place]) 02/10/2009  . THUMB SPRAIN 06/26/2008  . COPD (chronic obstructive pulmonary disease) ([place]) 10/31/2007  . BRONCHITIS, ACUTE 06/17/2007  . EMBOLISM/THROMBOSIS, DEEP VSL PRXML LWR EXTRM 05/31/2007  . Hypothyroidism 04/15/2007  . EMPHYSEMA 04/15/2007  . GERD 04/15/2007  . LUPUS ERYTHEMATOSUS, DISCOID 04/15/2007  . PULMONARY EMBOLISM, HX OF 04/15/2007  . Personal history of colonic polyps 04/15/2007  . DIVERTICULITIS, HX OF 04/15/2007  PCP:  Laurey Morale, MD Pharmacy:  No Pharmacies Listed    Social Determinants of Health (SDOH) Interventions    Readmission Risk Interventions Readmission Risk Prevention Plan 07/27/2020  Transportation Screening Complete  PCP or Specialist Appt within 3-5 Days Complete  HRI or [place] Complete  Social Work Consult for [place] Planning/Counseling Complete  Palliative Care Screening Not Applicable  Medication Review Press photographer) Complete  Some recent data might  be hidden

## 2020-08-06 NOTE — Progress Notes (Signed)
Jaime Kelley is a 69 y.o. female patient admitted from ED awake, alert - oriented  X 4 - no acute distress noted.  VSS - Blood pressure 114/65, pulse 98, temperature 98.3 F (36.8 C), temperature source Oral, resp. rate 20, height 5' (1.524 m), weight 95.3 kg, SpO2 97 %.    IV in place, occlusive dsg intact without redness.  Orientation to room, and floor completed with information packet given to patient.  Admission INP armband ID verified with patient and in place. Call light and bedside table within reach, patient able to voice, and demonstrate understanding. Skin, clean-dry- intact without evidence of bruising, or skin tears, but its is very flaky on all extremities. No evidence of skin break down noted on exam. The patient is comfortably resting in bed and has no complaints of pain.     Dene Gentry, RN 08/06/2020 6:41 PM

## 2020-08-06 NOTE — Progress Notes (Signed)
ANTICOAGULATION CONSULT NOTE - Initial Consult  Pharmacy Consult for warfarin Indication: Hx of VTE  Allergies  Allergen Reactions  . Effexor [Venlafaxine Hydrochloride] Rash    Patient Measurements: Height: 5' (152.4 cm) Weight: 95.3 kg (210 lb) IBW/kg (Calculated) : 45.5  Vital Signs: Temp: 98.6 F (37 C) (10/15 1225) Temp Source: Oral (10/15 1225) BP: 127/80 (10/15 1225) Pulse Rate: 100 (10/15 1225)  Labs: Recent Labs    08/06/20 1305 08/06/20 1456  HGB  --  11.6*  HCT  --  34.0*  CREATININE 0.47  --     Estimated Creatinine Clearance: 68.5 mL/min (by C-G formula based on SCr of 0.47 mg/dL).   Medical History: Past Medical History:  Diagnosis Date  . Anxiety   . COPD (chronic obstructive pulmonary disease) ([place])   . Depression   . Diabetes mellitus without complication ([place])   . Discoid lupus    sees Dr. Allyson Sabal  . Dyspnea   . GERD (gastroesophageal reflux disease)   . History of colonic polyps   . Hx pulmonary embolism   . Hypertension   . Hypothyroidism   . Rheumatoid arthritis(714.0)    sees Dr. Dossie Der    Assessment: Jaime Kelley presenting with low O2 sats, on warfarin PTA for hx of VTE.  Last dose 10/14, INR on admission 1.2.  COVID +, D-dimer 5.89  PTA dosing: 7.5mg  daily  Goal of Therapy:  INR 2-3 Monitor platelets by anticoagulation protocol: Yes   Plan:  Warfarin 10 mg PO x 1 today Daily INR, s/s bleeding  Bertis Ruddy, PharmD Clinical Pharmacist ED Pharmacist Phone # [phone] 08/06/2020 3:50 PM

## 2020-08-06 NOTE — ED Triage Notes (Addendum)
Pt arrives to ED from [place] ( [facility]). Pt currently has no complaints, but NH staff found pt tp have an O2 saturation of 87 % on 3-4 L of O2. Pt chronically wears O2 (3-4 Ls). Pt also tested positive for COVID 19 on 08/05/2020.  NH staff gave pt a nebulizer treatment in house. EMS noted that sats improved to 99% on 3 L of O2. Pt noted to have a nonproductive cough. Pt denies chest pain, weakness, SOB, dizziness, GI upset, or headache. VSS. NAD at this time.

## 2020-08-06 NOTE — H&P (Signed)
History and Physical    Jaime Kelley TIR:[id number] DOB: 12-15-1950 DOA: 08/06/2020  PCP: Laurey Morale, MD  Patient coming from: [facility] ( [place])  I have personally briefly reviewed patient's old medical records in [place]  Chief Complaint: Hypoxia  HPI: Jaime Kelley is a 69 y.o. female with medical history significant of hypertension, type 2 diabetes mellitus, morbid obesity, depression/anxiety, chronic hypoxemic respiratory failure secondary to underlying COPD-on 5 L of oxygen at baseline, RA, hypothyroidism, history of PE/DVT-on Coumadin, GERD presents to emergency department for evaluation of hypoxia.  This morning SNF staff found patient have oxygen saturation of 87% on 3 to 4 L of oxygen.  Patient also tested positive for COVID-19 on 10/14.  Patient was given nebulizer treatment and EMS was called and brought patient to the emergency department for further evaluation and management.  Patient is not very good historian.  She is alert but appears little confused.  She reports some shortness of breath which she thinks is because of her anxiety.  She does not want to be admitted.  Denies any other symptoms such as chest pain, cough, wheezing, headache, fever, chills, vomiting, abdominal pain, urinary or bowel changes.  Patient recently admitted on 9/18 with complaints of substernal chest pressure and was found to have large left-sided pleural effusion with complete collapse of left lung requiring nonrebreather initially.  Patient underwent left pleural space drainage and chest tube placement which was removed on 9/23.  Bronchoscopy was performed on 9/20 with removal of solitary mucous plug.  Repeat chest x-ray showed recurrent opacification.  Pulmonology performed a repeat thoracentesis at bedside on 07/24/2020.  Chest x-ray on 10/5 demonstrated a small amount of reaccumulation of pleural effusion however oxygen requirements improved to 3 L therefore she  discharged to SNF in a stable condition and received [place] Covid vaccine on 10/5.  She denies current use of cigarettes, alcohol, illicit drug use.  ED Course: Upon arrival to ED: Patient's vital signs stable, afebrile requiring 3 to 4 L of oxygen via nasal cannula.  COVID-19 positive, lactic acid: WNL, D-dimer: 5.89, BMP shows sodium of 128, CRP: 5.5, blood culture, CBC: Pending.  Chest x-ray concerning for left lower lobe pneumonia.  Patient was started on Solu-Medrol and remdesivir in ED and [facility] hospitalist consulted for admission due to COVID-19 pneumonia.  Review of Systems: As per HPI otherwise negative.    Past Medical History:  Diagnosis Date  . Anxiety   . COPD (chronic obstructive pulmonary disease) ([place])   . Depression   . Diabetes mellitus without complication ([place])   . Discoid lupus    sees Dr. Allyson Sabal  . Dyspnea   . GERD (gastroesophageal reflux disease)   . History of colonic polyps   . Hx pulmonary embolism   . Hypertension   . Hypothyroidism   . Rheumatoid arthritis(714.0)    sees Dr. Dossie Der     Past Surgical History:  Procedure Laterality Date  . ABDOMINAL HYSTERECTOMY    . arthroscopy right knee    . BREAST EXCISIONAL BIOPSY Right 2011  . carpal tunnel release left wrist    . CHEST TUBE INSERTION Left 07/12/2020   Procedure: INSERTION PLEURAL DRAINAGE CATHETER;  Surgeon: Garner Nash, DO;  Location: [facility];  Service: Pulmonary;  Laterality: Left;  . 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 TONSILLECTOMY    . VIDEO BRONCHOSCOPY N/A 07/12/2020   Procedure: VIDEO BRONCHOSCOPY WITHOUT FLUORO;  Surgeon: Valeta Harms,  Octavio Graves, DO;  Location: [facility];  Service: Pulmonary;  Laterality: N/A;     reports that she quit smoking about 3 years ago. Her smoking use included cigarettes. She has a 40.00 pack-year smoking history. She has never used smokeless tobacco. She reports that she does not drink alcohol and does not use drugs.  Allergies    Allergen Reactions  . Effexor [Venlafaxine Hydrochloride] Rash    Family History  Problem Relation Age of Onset  . Arthritis Other   . Coronary artery disease Other   . Hypertension Other   . Stroke Other   . Cancer Other        lung  . Lymphoma Son     Prior to Admission medications   Medication Sig Start Date End Date Taking? Authorizing Provider  acetaminophen (TYLENOL) 325 MG tablet Take 650 mg by mouth every 4 (four) hours as needed for mild pain, fever or headache.   Yes [provider]  albuterol (PROAIR HFA) 108 (90 Base) MCG/ACT inhaler INHALE 2 PUFFS INTO THE LUNGS EVERY 4 (FOUR) HOURS AS NEEDED FOR WHEEZING. Patient taking differently: Inhale 2 puffs into the lungs in the morning, at noon, and at bedtime.  01/02/20  Yes Laurey Morale, MD  ALPRAZolam Duanne Moron) 1 MG tablet Take 1 tablet (1 mg total) by mouth 3 (three) times daily as needed for anxiety. Patient taking differently: Take 1 mg by mouth every 6 (six) hours as needed for anxiety.  07/20/20  Yes Laurey Morale, MD  amLODipine (NORVASC) 10 MG tablet TAKE 1 TABLET BY MOUTH EVERY DAY Patient taking differently: Take 10 mg by mouth daily.  04/23/20  Yes Laurey Morale, MD  arformoterol (BROVANA) 15 MCG/2ML NEBU Take 2 mLs (15 mcg total) by nebulization 2 (two) times daily. 07/27/20  Yes Swayze, Ava, DO  ascorbic acid (VITAMIN C) 500 MG tablet Take 500 mg by mouth 2 (two) times daily.   Yes [provider]  budesonide (PULMICORT) 0.25 MG/2ML nebulizer solution Take 2 mLs (0.25 mg total) by nebulization 2 (two) times daily. 07/27/20  Yes Swayze, Ava, DO  guaiFENesin (MUCINEX) 600 MG 12 hr tablet Take 2 tablets (1,200 mg total) by mouth 2 (two) times daily. 07/27/20  Yes Swayze, Ava, DO  HYDROcodone-acetaminophen (NORCO) 10-325 MG tablet Take 0.5 tablets by mouth 4 (four) times daily as needed (pain). Patient taking differently: Take 0.5 tablets by mouth 4 (four) times daily as needed for moderate pain (pain).   07/27/20  Yes Swayze, Ava, DO  ipratropium-albuterol (DUONEB) 0.5-2.5 (3) MG/3ML SOLN TAKE 3 MLS BY NEBULIZATION EVERY 4 (FOUR) HOURS AS NEEDED. Patient taking differently: Take 3 mLs by nebulization every 4 (four) hours as needed (shortness of breath/wheezing).  08/27/19  Yes Laurey Morale, MD  losartan (COZAAR) 100 MG tablet TAKE 1 TABLET BY MOUTH EVERY DAY Patient taking differently: Take 100 mg by mouth daily.  07/02/20  Yes Laurey Morale, MD  metFORMIN (GLUCOPHAGE) 500 MG tablet Take 1 tablet (500 mg total) by mouth 2 (two) times daily with a meal. 07/29/20  Yes Laurey Morale, MD  Multiple Vitamin (MULTIVITAMIN WITH MINERALS) TABS tablet Take 1 tablet by mouth daily. 07/28/20  Yes Swayze, Ava, DO  revefenacin (YUPELRI) 175 MCG/3ML nebulizer solution Take 3 mLs (175 mcg total) by nebulization daily. 07/28/20  Yes Swayze, Ava, DO  sertraline (ZOLOFT) 100 MG tablet TAKE 1 TABLET BY MOUTH EVERY DAY Patient taking differently: Take 100 mg by mouth daily.  07/14/20  Yes Laurey Morale, MD  SYNTHROID 137 MCG tablet TAKE 1 TABLET BY MOUTH EVERY DAY BEFORE BREAKFAST Patient taking differently: Take 137 mcg by mouth daily before breakfast.  04/23/20  Yes Laurey Morale, MD  Tiotropium Bromide Monohydrate (SPIRIVA RESPIMAT) 2.5 MCG/ACT AERS Inhale 2 puffs into the lungs daily.   Yes [provider]  warfarin (COUMADIN) 1 MG tablet Take 1 mg by mouth once.   Yes [provider]  warfarin (COUMADIN) 7.5 MG tablet Take 1 tablet daily beginning 07/28/2020 or [place] Patient taking differently: Take 7.5 mg by mouth every evening. Take 1 tablet daily beginning 07/28/2020 or AS DIRECTED BY ANTICOAGULATION CLINIC 07/27/20  Yes Swayze, Ava, DO  feeding supplement, GLUCERNA SHAKE, (GLUCERNA SHAKE) LIQD Take 237 mLs by mouth 3 (three) times daily between meals. Patient not taking: Reported on 08/06/2020 07/27/20   Swayze, Ava, DO  Menthol-Methyl Salicylate (MUSCLE RUB) 10-15 %  CREA Apply 1 application topically as needed for muscle pain. Patient not taking: Reported on 08/06/2020 07/27/20   Swayze, Ava, DO  OXYGEN Inhale 5 L into the lungs. continuous    [provider]  Vitamin D, Ergocalciferol, (DRISDOL) 1.25 MG (50000 UNIT) CAPS capsule Take 50,000 Units by mouth daily.    [provider]  warfarin (COUMADIN) 10 MG tablet Take 1 tablet (10 mg total) by mouth one time only at 4 PM for 1 dose. 07/27/20 07/28/20  Swayze, Ava, DO    Physical Exam: Vitals:   08/06/20 1222 08/06/20 1225  BP:  127/80  Pulse:  100  Resp:  18  Temp:  98.6 F (37 C)  TempSrc:  Oral  SpO2:  100%  Weight: 95.3 kg   Height: 5' (1.524 m)     Constitutional: NAD, calm, comfortable, obese, on 4 L of oxygen via nasal cannula, communicating well Eyes: PERRL, lids and conjunctivae normal ENMT: Mucous membranes are moist. Posterior pharynx clear of any exudate or lesions.Normal dentition.  Neck: normal, supple, no masses, no thyromegaly Respiratory: Tachypneic, using accessory muscles.  No wheezing, no crackles. Cardiovascular: Regular rate and rhythm, no murmurs / rubs / gallops. No extremity edema. 2+ pedal pulses. No carotid bruits.  Abdomen: no tenderness, no masses palpated. No hepatosplenomegaly. Bowel sounds positive.  Musculoskeletal: no clubbing / cyanosis. No joint deformity upper and lower extremities. Good ROM, no contractures. Normal muscle tone.  Skin: no rashes, lesions, ulcers. No induration Neurologic: CN 2-12 grossly intact. Sensation intact, DTR normal. Strength 5/5 in all 4.     Labs on Admission: I have personally reviewed following labs and imaging studies  CBC: Recent Labs  Lab 08/06/20 1456  HGB 11.6*  HCT 22.0*   Basic Metabolic Panel: Recent Labs  Lab 08/06/20 1305 08/06/20 1456  NA 128* 128*  K 3.9 3.8  CL 89*  --   CO2 28  --   GLUCOSE 89  --   BUN 7*  --   CREATININE 0.47  --   CALCIUM 8.4*  --    GFR: Estimated  Creatinine Clearance: 68.5 mL/min (by C-G formula based on SCr of 0.47 mg/dL). Liver Function Tests: Recent Labs  Lab 08/06/20 1305  AST 21  ALT 13  ALKPHOS 52  BILITOT 0.5  PROT 6.5  ALBUMIN 3.0*   No results for input(s): LIPASE, AMYLASE in the last 168 hours. No results for input(s): AMMONIA in the last 168 hours. Coagulation Profile: No results for input(s): INR, PROTIME in the last 168 hours.  Cardiac Enzymes: No results for input(s): CKTOTAL, CKMB, CKMBINDEX, TROPONINI in the last 168 hours. BNP (last 3 results) No results for input(s): PROBNP in the last 8760 hours. HbA1C: No results for input(s): HGBA1C in the last 72 hours. CBG: No results for input(s): GLUCAP in the last 168 hours. Lipid Profile: Recent Labs    08/06/20 1305  TRIG 101   Thyroid Function Tests: No results for input(s): TSH, T4TOTAL, FREET4, T3FREE, THYROIDAB in the last 72 hours. Anemia Panel: Recent Labs    08/06/20 1305  FERRITIN 225   Urine analysis:    Component Value Date/Time   COLORURINE AMBER (A) 07/26/2020 1811   APPEARANCEUR TURBID (A) 07/26/2020 1811   LABSPEC 1.014 07/26/2020 1811   PHURINE 7.0 07/26/2020 1811   GLUCOSEU NEGATIVE 07/26/2020 1811   HGBUR SMALL (A) 07/26/2020 1811   BILIRUBINUR NEGATIVE 07/26/2020 1811   BILIRUBINUR n 02/03/2015 [place] 07/26/2020 1811   PROTEINUR 100 (A) 07/26/2020 1811   UROBILINOGEN 0.2 02/03/2015 1507   NITRITE NEGATIVE 07/26/2020 1811   LEUKOCYTESUR MODERATE (A) 07/26/2020 1811    Radiological Exams on Admission: DG Chest Port 1 View  Result Date: 08/06/2020 CLINICAL DATA:  COVID-19 positive with shortness of breath EXAM: PORTABLE CHEST 1 VIEW COMPARISON:  July 26, 2020 FINDINGS: There is left lower lobe airspace opacity with small left pleural effusion. The lungs elsewhere are clear. The heart is upper normal in size with pulmonary vascularity normal. No adenopathy. There is aortic atherosclerosis. No bone  lesions. IMPRESSION: Small left pleural effusion. Airspace opacity in the left lower lobe concerning for pneumonia with likely associated atelectasis. Lungs elsewhere clear. Stable cardiac silhouette. Aortic Atherosclerosis (ICD10-I70.0). Electronically Signed   By: Lowella Grip III M.D.   On: 08/06/2020 13:31    EKG: Independently reviewed.  Sinus rhythm, ventricular premature complexes, no acute ST-T wave changes noted.  Assessment/Plan Principal Problem:   Pneumonia due to COVID-19 virus Active Problems:   Hypothyroidism   COPD (chronic obstructive pulmonary disease) (H[facility])   GERD   PULMONARY EMBOLISM, HX OF   Hypertension   Type 2 diabetes mellitus ([facility])   Hyponatremia   Obesity   Pneumonia due to COVID-19 virus: -Received J&J vaccine on 10/5.  Presented with hypoxia, and shortness of breath.  COVID-19 positive.  Reviewed chest x-ray.  She is afebrile.  CBC, blood culture, procalcitonin: Pending.  Lactic acid: WNL. -Admit patient stepdown unit for close monitoring.  On continuous pulse ox.   -Patient was given Solu-Medrol, breathing treatment and remdesivir in ED. -Continue Solu-Medrol and remdesivir. -Start patient on p.o. vitamins, antitussive and albuterol every 6 hours. -Repeat inflammatory markers tomorrow AM.  Monitor vitals closely. -Patient was told that if COVID-19 pneumonitis gets worse we potentially may use non-FDA approved baricitinib.  She denies known history of hepatitis or tuberculosis and wants to proceed with baricitinib if necessary.  Chronic hypoxemic respiratory failure secondary to underlying COPD: -Patient is currently on 4 L of oxygen via nasal cannula which is her baseline.  Reviewed chest x-ray.  No wheezing noted on exam.  Check BNP, troponin. -Continue home inhalers.  Hypertension: Stable -Continue amlodipine, losartan.  Monitor blood pressure closely  Type 2 diabetes mellitus: Hold Metformin.  Start patient on sliding scale insulin.  Check  A1c.  Hypothyroidism: Continue Synthyroid  Depression/anxiety: Continue Zoloft, Xanax as needed  History of PE/DVT: Continue Coumadin as per pharmacy.  Check PT/INR.  DVT prophylaxis: Coumadin/SCD Code Status: Full code-confirmed with son Family Communication: None present  at bedside.  Plan of care discussed with patient in length and she verbalized understanding and agreed with it.  I called patient's son and discussed and updated him about the patient.  He wants to keep patient full code.  Disposition Plan: SNF in 2 to 3 days Consults called: None Admission status: Inpatient   Mckinley Jewel MD [facility]  If 7PM-7AM, please contact night-coverage [email or web address] Password [email or web address]  08/06/2020, 3:04 PM

## 2020-08-06 NOTE — ED Provider Notes (Signed)
[facility] EMERGENCY DEPARTMENT Provider Note   CSN: [id number] Arrival date & time: 08/06/20  1219     History Chief Complaint  Patient presents with  . Shortness of Breath    COVID +    Jaime Kelley is a 69 y.o. female.  Jaime Kelley is a 69 y.o. female with a history of COPD on chronic O2, diabetes, hypertension, PE, GERD, who presents to the ED via EMS for evaluation of shortness of breath and hypoxia.  Patient is coming from [facility] facility where she has been a resident since she was discharged from the hospital on 10/5.  The facility ports that yesterday she was diagnosed with Covid, did receive 1 dose of [employer] on 10/5.  Today she was found to be hypoxic at 87% on her home 3-4 L of oxygen.  Patient reports that she has had a frequent dry cough.  She denies chest pain, diarrhea, vomiting, abdominal pain.  The facility gave her a breathing treatment which she reports she thinks helps with her breathing.  But she still has a frequent cough and audible wheezing upon entering the room.  She was just recently discharged after an extended hospital stay.  No other treatments prior to arrival.  No other aggravating or alleviating factors.        Past Medical History:  Diagnosis Date  . Anxiety   . COPD (chronic obstructive pulmonary disease) ([place])   . Depression   . Diabetes mellitus without complication ([place])   . Discoid lupus    sees Dr. Allyson Sabal  . Dyspnea   . GERD (gastroesophageal reflux disease)   . History of colonic polyps   . Hx pulmonary embolism   . Hypertension   . Hypothyroidism   . Rheumatoid arthritis(714.0)    sees Dr. Dossie Der     Patient Active Problem List   Diagnosis Date Noted  . Pneumonia due to COVID-19 virus   . Stage II pressure ulcer of right buttock ([place]) 07/12/2020  . Obesity 07/12/2020  . Mucus plugging of bronchi   . Pleural effusion on left 07/10/2020  . Collapsed lung 07/10/2020  .  Microcytic anemia 07/10/2020  . Chest pain 07/10/2020  . Thrombocytosis 07/10/2020  . Generalized weakness 07/10/2020  . Chronic pain of both knees 02/25/2019  . Upper airway cough syndrome 12/22/2017  . Hyponatremia 02/13/2017  . Acute hypoxemic respiratory failure ([place]) 01/18/2017  . Type 2 diabetes mellitus ([place]) 05/25/2016  . Encounter for therapeutic drug monitoring 11/20/2013  . Anxiety state, unspecified 09/16/2013  . Long term (current) use of anticoagulants 09/11/2013  . Anticoagulated on Coumadin 06/28/2013  . Depression 05/26/2013  . Hx pulmonary embolism 05/19/2013  . Tobacco abuse 05/19/2013  . Hypertension 05/13/2013  . Embolism and thrombosis of deep vessels of proximal lower extremity ([place]) 11/05/2010  . BACK STRAIN, LUMBAR 07/09/2009  . Rheumatoid arthritis ([place]) 02/10/2009  . THUMB SPRAIN 06/26/2008  . COPD (chronic obstructive pulmonary disease) ([place]) 10/31/2007  . BRONCHITIS, ACUTE 06/17/2007  . EMBOLISM/THROMBOSIS, DEEP VSL PRXML LWR EXTRM 05/31/2007  . Hypothyroidism 04/15/2007  . EMPHYSEMA 04/15/2007  . GERD 04/15/2007  . LUPUS ERYTHEMATOSUS, DISCOID 04/15/2007  . PULMONARY EMBOLISM, HX OF 04/15/2007  . Personal history of colonic polyps 04/15/2007  . DIVERTICULITIS, HX OF 04/15/2007    Past Surgical History:  Procedure Laterality Date  . ABDOMINAL HYSTERECTOMY    . arthroscopy right knee    . BREAST EXCISIONAL BIOPSY Right 2011  . carpal  tunnel release left wrist    . CHEST TUBE INSERTION Left 07/12/2020   Procedure: INSERTION PLEURAL DRAINAGE CATHETER;  Surgeon: Garner Nash, DO;  Location: [facility];  Service: Pulmonary;  Laterality: Left;  . 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 TONSILLECTOMY    . VIDEO BRONCHOSCOPY N/A 07/12/2020   Procedure: VIDEO BRONCHOSCOPY WITHOUT FLUORO;  Surgeon: Garner Nash, DO;  Location: [facility];  Service: Pulmonary;  Laterality: N/A;     OB History   No obstetric history on file.      Family History  Problem Relation Age of Onset  . Arthritis Other   . Coronary artery disease Other   . Hypertension Other   . Stroke Other   . Cancer Other        lung  . Lymphoma Son     Social History   Tobacco Use  . Smoking status: Former Smoker    Packs/day: 1.00    Years: 40.00    Pack years: 40.00    Types: Cigarettes    Quit date: 01/18/2017    Years since quitting: 3.5  . Smokeless tobacco: Never Used  Substance Use Topics  . Alcohol use: No    Alcohol/week: 0.0 standard drinks  . Drug use: No    Home Medications Prior to Admission medications   Medication Sig Start Date End Date Taking? Authorizing Provider  acetaminophen (TYLENOL) 325 MG tablet Take 650 mg by mouth every 4 (four) hours as needed for mild pain, fever or headache.   Yes [provider]  albuterol (PROAIR HFA) 108 (90 Base) MCG/ACT inhaler INHALE 2 PUFFS INTO THE LUNGS EVERY 4 (FOUR) HOURS AS NEEDED FOR WHEEZING. Patient taking differently: Inhale 2 puffs into the lungs in the morning, at noon, and at bedtime.  01/02/20  Yes Laurey Morale, MD  ALPRAZolam Duanne Moron) 1 MG tablet Take 1 tablet (1 mg total) by mouth 3 (three) times daily as needed for anxiety. Patient taking differently: Take 1 mg by mouth every 6 (six) hours as needed for anxiety.  07/20/20  Yes Laurey Morale, MD  amLODipine (NORVASC) 10 MG tablet TAKE 1 TABLET BY MOUTH EVERY DAY Patient taking differently: Take 10 mg by mouth daily.  04/23/20  Yes Laurey Morale, MD  arformoterol (BROVANA) 15 MCG/2ML NEBU Take 2 mLs (15 mcg total) by nebulization 2 (two) times daily. 07/27/20  Yes Swayze, Ava, DO  ascorbic acid (VITAMIN C) 500 MG tablet Take 500 mg by mouth 2 (two) times daily.   Yes [provider]  budesonide (PULMICORT) 0.25 MG/2ML nebulizer solution Take 2 mLs (0.25 mg total) by nebulization 2 (two) times daily. 07/27/20  Yes Swayze, Ava, DO  guaiFENesin (MUCINEX) 600 MG 12 hr tablet Take 2 tablets (1,200 mg total)  by mouth 2 (two) times daily. 07/27/20  Yes Swayze, Ava, DO  HYDROcodone-acetaminophen (NORCO) 10-325 MG tablet Take 0.5 tablets by mouth 4 (four) times daily as needed (pain). Patient taking differently: Take 0.5 tablets by mouth 4 (four) times daily as needed for moderate pain (pain).  07/27/20  Yes Swayze, Ava, DO  ipratropium-albuterol (DUONEB) 0.5-2.5 (3) MG/3ML SOLN TAKE 3 MLS BY NEBULIZATION EVERY 4 (FOUR) HOURS AS NEEDED. Patient taking differently: Take 3 mLs by nebulization every 4 (four) hours as needed (shortness of breath/wheezing).  08/27/19  Yes Laurey Morale, MD  losartan (COZAAR) 100 MG tablet TAKE 1 TABLET BY MOUTH EVERY DAY Patient taking differently: Take 100 mg by mouth  daily.  07/02/20  Yes Laurey Morale, MD  metFORMIN (GLUCOPHAGE) 500 MG tablet Take 1 tablet (500 mg total) by mouth 2 (two) times daily with a meal. 07/29/20  Yes Laurey Morale, MD  Multiple Vitamin (MULTIVITAMIN WITH MINERALS) TABS tablet Take 1 tablet by mouth daily. 07/28/20  Yes Swayze, Ava, DO  revefenacin (YUPELRI) 175 MCG/3ML nebulizer solution Take 3 mLs (175 mcg total) by nebulization daily. 07/28/20  Yes Swayze, Ava, DO  sertraline (ZOLOFT) 100 MG tablet TAKE 1 TABLET BY MOUTH EVERY DAY Patient taking differently: Take 100 mg by mouth daily.  07/14/20  Yes Laurey Morale, MD  SYNTHROID 137 MCG tablet TAKE 1 TABLET BY MOUTH EVERY DAY BEFORE BREAKFAST Patient taking differently: Take 137 mcg by mouth daily before breakfast.  04/23/20  Yes Laurey Morale, MD  Tiotropium Bromide Monohydrate (SPIRIVA RESPIMAT) 2.5 MCG/ACT AERS Inhale 2 puffs into the lungs daily.   Yes [provider]  warfarin (COUMADIN) 1 MG tablet Take 1 mg by mouth once.   Yes [provider]  warfarin (COUMADIN) 7.5 MG tablet Take 1 tablet daily beginning 07/28/2020 or [place] Patient taking differently: Take 7.5 mg by mouth every evening. Take 1 tablet daily beginning 07/28/2020 or AS DIRECTED BY  ANTICOAGULATION CLINIC 07/27/20  Yes Swayze, Ava, DO  feeding supplement, GLUCERNA SHAKE, (GLUCERNA SHAKE) LIQD Take 237 mLs by mouth 3 (three) times daily between meals. Patient not taking: Reported on 08/06/2020 07/27/20   Swayze, Ava, DO  Menthol-Methyl Salicylate (MUSCLE RUB) 10-15 % CREA Apply 1 application topically as needed for muscle pain. Patient not taking: Reported on 08/06/2020 07/27/20   Swayze, Ava, DO  OXYGEN Inhale 5 L into the lungs. continuous    [provider]  Vitamin D, Ergocalciferol, (DRISDOL) 1.25 MG (50000 UNIT) CAPS capsule Take 50,000 Units by mouth daily.    [provider]  warfarin (COUMADIN) 10 MG tablet Take 1 tablet (10 mg total) by mouth one time only at 4 PM for 1 dose. 07/27/20 07/28/20  Swayze, Ava, DO    Allergies    Effexor [venlafaxine hydrochloride]  Review of Systems   Review of Systems  Constitutional: Negative for chills and fever.  HENT: Negative.   Respiratory: Positive for cough and shortness of breath.   Cardiovascular: Negative for chest pain.  Gastrointestinal: Negative for abdominal pain, diarrhea and vomiting.  Musculoskeletal: Negative for arthralgias and myalgias.  All other systems reviewed and are negative.   Physical Exam Updated Vital Signs Pulse 100   Temp 98.6 F (37 C) (Oral)   Resp 18   Ht 5' (1.524 m)   Wt 95.3 kg   SpO2 100%   BMI 41.01 kg/m   Physical Exam Vitals and nursing note reviewed.  Constitutional:      General: She is not in acute distress.    Appearance: She is well-developed. She is ill-appearing. She is not diaphoretic.     Comments: Chronically ill appearing with worsening respiratory status  HENT:     Head: Normocephalic and atraumatic.     Mouth/Throat:     Mouth: Mucous membranes are moist.     Pharynx: Oropharynx is clear.  Eyes:     General:        Right eye: No discharge.        Left eye: No discharge.     Pupils: Pupils are equal, round, and reactive to light.    Cardiovascular:     Rate and  Rhythm: Normal rate and regular rhythm.     Heart sounds: Normal heart sounds. No murmur heard.  No friction rub. No gallop.   Pulmonary:     Effort: Pulmonary effort is normal. Tachypnea present. No respiratory distress.     Breath sounds: Decreased breath sounds, wheezing and rhonchi present. No rales.     Comments: Respirations equal and unlabored.  Patient is mildly tachypneic, able to speak in short sentences, on exam breath sounds are diminished bilaterally and patient has wheezes and rhonchi throughout Chest:     Chest wall: No tenderness.  Abdominal:     General: Bowel sounds are normal. There is no distension.     Palpations: Abdomen is soft. There is no mass.     Tenderness: There is no abdominal tenderness. There is no guarding.     Comments: Abdomen soft, nondistended, nontender to palpation in all quadrants without guarding or peritoneal signs  Musculoskeletal:        General: No deformity.     Cervical back: Neck supple.     Right lower leg: No tenderness. No edema.     Left lower leg: No tenderness. No edema.  Skin:    General: Skin is warm and dry.     Capillary Refill: Capillary refill takes less than 2 seconds.  Neurological:     Mental Status: She is alert and oriented to person, place, and time.     Coordination: Coordination normal.     Comments: Speech is clear, able to follow commands Moves extremities without ataxia, coordination intact  Psychiatric:        Mood and Affect: Mood normal.        Behavior: Behavior normal.     ED Results / Procedures / Treatments   Labs (all labs ordered are listed, but only abnormal results are displayed) Labs Reviewed  RESPIRATORY PANEL BY RT PCR (FLU A&B, COVID) - Abnormal; Notable for the following components:      Result Value   SARS Coronavirus 2 by RT PCR POSITIVE (*)    All other components within normal limits  COMPREHENSIVE METABOLIC PANEL - Abnormal; Notable for the following  components:   Sodium 128 (*)    Chloride 89 (*)    BUN 7 (*)    Calcium 8.4 (*)    Albumin 3.0 (*)    All other components within normal limits  D-DIMER, QUANTITATIVE (NOT AT [facility]) - Abnormal; Notable for the following components:   D-Dimer, Quant 5.89 (*)    All other components within normal limits  LACTATE DEHYDROGENASE - Abnormal; Notable for the following components:   LDH 197 (*)    All other components within normal limits  FIBRINOGEN - Abnormal; Notable for the following components:   Fibrinogen 493 (*)    All other components within normal limits  C-REACTIVE PROTEIN - Abnormal; Notable for the following components:   CRP 5.5 (*)    All other components within normal limits  I-STAT VENOUS BLOOD GAS, ED - Abnormal; Notable for the following components:   pH, Ven 7.458 (*)    pO2, Ven 54.0 (*)    Bicarbonate 33.0 (*)    TCO2 34 (*)    Acid-Base Excess 8.0 (*)    Sodium 128 (*)    Calcium, Ion 1.03 (*)    HCT 34.0 (*)    Hemoglobin 11.6 (*)    All other components within normal limits  CULTURE, BLOOD (ROUTINE X 2)  CULTURE, BLOOD (ROUTINE X 2)  LACTIC ACID, PLASMA  LACTIC ACID, PLASMA  PROCALCITONIN  FERRITIN  TRIGLYCERIDES  CBC WITH DIFFERENTIAL/PLATELET  PROTIME-INR  CBC WITH DIFFERENTIAL/PLATELET  BRAIN NATRIURETIC PEPTIDE  HEPATITIS B SURFACE ANTIGEN  CBC WITH DIFFERENTIAL/PLATELET  TROPONIN I (HIGH SENSITIVITY)    EKG None  Radiology DG Chest Port 1 View  Result Date: 08/06/2020 CLINICAL DATA:  COVID-19 positive with shortness of breath EXAM: PORTABLE CHEST 1 VIEW COMPARISON:  July 26, 2020 FINDINGS: There is left lower lobe airspace opacity with small left pleural effusion. The lungs elsewhere are clear. The heart is upper normal in size with pulmonary vascularity normal. No adenopathy. There is aortic atherosclerosis. No bone lesions. IMPRESSION: Small left pleural effusion. Airspace opacity in the left lower lobe concerning for pneumonia with likely  associated atelectasis. Lungs elsewhere clear. Stable cardiac silhouette. Aortic Atherosclerosis (ICD10-I70.0). Electronically Signed   By: Lowella Grip III M.D.   On: 08/06/2020 13:31    Procedures Procedures (including critical care time)  Medications Ordered in ED Medications  aerochamber plus with mask device 1 each (1 each Other Not Given 08/06/20 1414)  sodium chloride 0.9 % bolus 500 mL (has no administration in time range)  methylPREDNISolone sodium succinate (SOLU-MEDROL) 125 mg/2 mL injection 125 mg (has no administration in time range)  remdesivir 200 mg in sodium chloride 0.9% 250 mL IVPB (has no administration in time range)    Followed by  remdesivir 100 mg in sodium chloride 0.9 % 100 mL IVPB (has no administration in time range)  albuterol (VENTOLIN HFA) 108 (90 Base) MCG/ACT inhaler 2 puff (has no administration in time range)  methylPREDNISolone sodium succinate (SOLU-MEDROL) 125 mg/2 mL injection 95 mg (has no administration in time range)    Followed by  predniSONE (DELTASONE) tablet 50 mg (has no administration in time range)  guaiFENesin-dextromethorphan (ROBITUSSIN DM) 100-10 MG/5ML syrup 10 mL (has no administration in time range)  chlorpheniramine-HYDROcodone (TUSSIONEX) 10-8 MG/5ML suspension 5 mL (has no administration in time range)  ascorbic acid (VITAMIN C) tablet 500 mg (has no administration in time range)  zinc sulfate capsule 220 mg (has no administration in time range)  acetaminophen (TYLENOL) tablet 650 mg (has no administration in time range)  ondansetron (ZOFRAN) tablet 4 mg (has no administration in time range)    Or  ondansetron (ZOFRAN) injection 4 mg (has no administration in time range)  HYDROcodone-acetaminophen (NORCO) 10-325 MG per tablet 0.5 tablet (has no administration in time range)  amLODipine (NORVASC) tablet 10 mg (has no administration in time range)  losartan (COZAAR) tablet 100 mg (has no administration in time range)    ALPRAZolam (XANAX) tablet 1 mg (has no administration in time range)  sertraline (ZOLOFT) tablet 100 mg (has no administration in time range)  levothyroxine (SYNTHROID) tablet 137 mcg (has no administration in time range)  umeclidinium bromide (INCRUSE ELLIPTA) 62.5 MCG/INH 1 puff (1 puff Inhalation Not Given 08/06/20 1516)  albuterol (VENTOLIN HFA) 108 (90 Base) MCG/ACT inhaler 2 puff (2 puffs Inhalation Given 08/06/20 1256)  ipratropium (ATROVENT HFA) inhaler 2 puff (2 puffs Inhalation Given 08/06/20 1256)    ED Course  I have reviewed the triage vital signs and the nursing notes.  Pertinent labs & imaging results that were available during my care of the patient were reviewed by me and considered in my medical decision making (see chart for details).    MDM Rules/Calculators/A&P  69 year old female arrives from skilled nursing facility for evaluation of shortness of breath, hypoxia and cough in the setting of Covid infection.  Per facility patient tested positive for Covid yesterday.  She had just recently received a dose of the [employer] vaccine on 10/5.  Patient had recent admission and was discharged to skilled nursing facility on 10/5.  She at baseline wears 3-4 L nasal cannula but at facility was noted to be satting at 87% on her home oxygen with cough and increased work of breathing.  Was given a breathing treatment at facility with some improvement, but on my exam she continues to have rhonchi and diminished breath sounds bilaterally.  Concern for hypoxia in the setting of Covid and the patient will likely require hospital admission.  Will get basic labs and Covid inflammatory markers as well as EKG and chest x-ray.  Will get VBG as well.   Labs show a sodium of 128 and chloride of 89 suggestive of dehydration, normal renal and liver function, CBC had to be recollected and pending, Covid positive status confirmed here in the ED, and patient has  significant elevation of multiple inflammatory markers.  Chest x-ray shows small pleural effusion and airspace opacity in the left lower lobe concerning for pneumonia.  Procalcitonin is not elevated, favor this pneumonia is likely related to Covid infection.  Patient's VBG does not show CO2 retention.  Given episode of hypoxia with signs of developing Covid pneumonia on chest x-ray feel patient will benefit from hospital admission, patient started on remdesivir and steroids.  Case discussed with Dr. Doristine Bosworth with [facility] hospitalist who will see and admit the patient.  Jaime Kelley was evaluated in Emergency Department on 08/06/2020 for the symptoms described in the history of present illness. She was evaluated in the context of the global COVID-19 pandemic, which necessitated consideration that the patient might be at risk for infection with the SARS-CoV-2 virus that causes COVID-19. Institutional protocols and algorithms that pertain to the evaluation of patients at risk for COVID-19 are in a state of rapid change based on information released by regulatory bodies including the CDC and federal and state organizations. These policies and algorithms were followed during the patient's care in the ED.  Final Clinical Impression(s) / ED Diagnoses Final diagnoses:  Acute hypoxemic respiratory failure due to COVID-19 [facility])    Rx / DC Orders ED Discharge Orders    None       Jacqlyn Larsen, [place] 08/06/20 1609    Maudie Flakes, MD 08/09/20 1941

## 2020-08-07 ENCOUNTER — Inpatient Hospital Stay (HOSPITAL_COMMUNITY): Payer: Medicare Other

## 2020-08-07 DIAGNOSIS — F4322 Adjustment disorder with anxiety: Secondary | ICD-10-CM | POA: Diagnosis not present

## 2020-08-07 DIAGNOSIS — J1282 Pneumonia due to coronavirus disease 2019: Secondary | ICD-10-CM | POA: Diagnosis not present

## 2020-08-07 DIAGNOSIS — U071 COVID-19: Secondary | ICD-10-CM | POA: Diagnosis not present

## 2020-08-07 LAB — CBC WITH DIFFERENTIAL/PLATELET
Abs Immature Granulocytes: 0 10*3/uL (ref 0.00–0.07)
Basophils Absolute: 0 10*3/uL (ref 0.0–0.1)
Basophils Relative: 1 %
Eosinophils Absolute: 0 10*3/uL (ref 0.0–0.5)
Eosinophils Relative: 0 %
HCT: 34.8 % — ABNORMAL LOW (ref 36.0–46.0)
Hemoglobin: 10.4 g/dL — ABNORMAL LOW (ref 12.0–15.0)
Immature Granulocytes: 0 %
Lymphocytes Relative: 31 %
Lymphs Abs: 0.7 10*3/uL (ref 0.7–4.0)
MCH: 23.3 pg — ABNORMAL LOW (ref 26.0–34.0)
MCHC: 29.9 g/dL — ABNORMAL LOW (ref 30.0–36.0)
MCV: 77.9 fL — ABNORMAL LOW (ref 80.0–100.0)
Monocytes Absolute: 0.1 10*3/uL (ref 0.1–1.0)
Monocytes Relative: 2 %
Neutro Abs: 1.5 10*3/uL — ABNORMAL LOW (ref 1.7–7.7)
Neutrophils Relative %: 66 %
Platelets: 262 10*3/uL (ref 150–400)
RBC: 4.47 MIL/uL (ref 3.87–5.11)
WBC: 2.2 10*3/uL — ABNORMAL LOW (ref 4.0–10.5)
nRBC: 0 % (ref 0.0–0.2)

## 2020-08-07 LAB — COMPREHENSIVE METABOLIC PANEL
ALT: 15 U/L (ref 0–44)
AST: 18 U/L (ref 15–41)
Albumin: 2.9 g/dL — ABNORMAL LOW (ref 3.5–5.0)
Alkaline Phosphatase: 46 U/L (ref 38–126)
Anion gap: 9 (ref 5–15)
BUN: 6 mg/dL — ABNORMAL LOW (ref 8–23)
CO2: 30 mmol/L (ref 22–32)
Calcium: 8.7 mg/dL — ABNORMAL LOW (ref 8.9–10.3)
Chloride: 92 mmol/L — ABNORMAL LOW (ref 98–111)
Creatinine, Ser: 0.5 mg/dL (ref 0.44–1.00)
GFR, Estimated: >60 mL/min (ref >60)
Glucose, Bld: 127 mg/dL — ABNORMAL HIGH (ref 70–99)
Potassium: 3.9 mmol/L (ref 3.5–5.1)
Sodium: 131 mmol/L — ABNORMAL LOW (ref 135–145)
Total Bilirubin: 0.3 mg/dL (ref 0.3–1.2)
Total Protein: 6.2 g/dL — ABNORMAL LOW (ref 6.5–8.1)

## 2020-08-07 LAB — GLUCOSE, CAPILLARY
Glucose-Capillary: 118 mg/dL — ABNORMAL HIGH (ref 70–99)
Glucose-Capillary: 107 mg/dL — ABNORMAL HIGH (ref 70–99)
Glucose-Capillary: 117 mg/dL — ABNORMAL HIGH (ref 70–99)
Glucose-Capillary: 146 mg/dL — ABNORMAL HIGH (ref 70–99)

## 2020-08-07 LAB — D-DIMER, QUANTITATIVE: D-Dimer, Quant: 5.09 ug/mL-FEU — ABNORMAL HIGH (ref 0.00–0.50)

## 2020-08-07 LAB — C-REACTIVE PROTEIN: CRP: 6.6 mg/dL — ABNORMAL HIGH (ref <1.0)

## 2020-08-07 LAB — PROTIME-INR
INR: 1.2 (ref 0.8–1.2)
Prothrombin Time: 15.2 seconds (ref 11.4–15.2)

## 2020-08-07 LAB — MAGNESIUM: Magnesium: 1.9 mg/dL (ref 1.7–2.4)

## 2020-08-07 LAB — FERRITIN: Ferritin: 260 ng/mL (ref 11–307)

## 2020-08-07 LAB — BRAIN NATRIURETIC PEPTIDE: B Natriuretic Peptide: 38.8 pg/mL (ref 0.0–100.0)

## 2020-08-07 LAB — PHOSPHORUS: Phosphorus: 4 mg/dL (ref 2.5–4.6)

## 2020-08-07 LAB — MRSA PCR SCREENING: MRSA by PCR: NEGATIVE

## 2020-08-07 MED ORDER — ENOXAPARIN SODIUM 100 MG/ML ~~LOC~~ SOLN
1.0000 mg/kg | Freq: Two times a day (BID) | SUBCUTANEOUS | Status: DC
  Administered 2020-08-07 – 2020-08-09 (×5): 95 mg via SUBCUTANEOUS
  Filled 2020-08-07 (×5): qty 1

## 2020-08-07 MED ORDER — IOHEXOL 350 MG/ML SOLN
100.0000 mL | Freq: Once | INTRAVENOUS | Status: AC | PRN
  Administered 2020-08-07: 60 mL via INTRAVENOUS

## 2020-08-07 MED ORDER — INSULIN GLARGINE 100 UNIT/ML ~~LOC~~ SOLN
10.0000 [IU] | Freq: Every day | SUBCUTANEOUS | Status: DC
  Administered 2020-08-07 – 2020-08-08 (×2): 10 [IU] via SUBCUTANEOUS
  Filled 2020-08-07 (×3): qty 0.1

## 2020-08-07 MED ORDER — SODIUM CHLORIDE 0.9 % IV SOLN: INTRAVENOUS | Status: DC | PRN

## 2020-08-07 MED ORDER — ALPRAZOLAM 0.5 MG PO TABS: 1.0000 mg | ORAL_TABLET | Freq: Every day | ORAL | Status: DC | PRN

## 2020-08-07 MED ORDER — INSULIN ASPART 100 UNIT/ML ~~LOC~~ SOLN: 0.0000 [IU] | Freq: Every day | SUBCUTANEOUS | Status: DC

## 2020-08-07 MED ORDER — PANTOPRAZOLE SODIUM 40 MG PO TBEC
40.0000 mg | DELAYED_RELEASE_TABLET | Freq: Every day | ORAL | Status: DC
  Administered 2020-08-07 – 2020-08-17 (×11): 40 mg via ORAL
  Filled 2020-08-07 (×11): qty 1

## 2020-08-07 MED ORDER — INSULIN ASPART 100 UNIT/ML ~~LOC~~ SOLN
0.0000 [IU] | Freq: Three times a day (TID) | SUBCUTANEOUS | Status: DC
  Administered 2020-08-09: 3 [IU] via SUBCUTANEOUS
  Administered 2020-08-10 – 2020-08-16 (×3): 2 [IU] via SUBCUTANEOUS

## 2020-08-07 MED ORDER — WARFARIN SODIUM 5 MG PO TABS
10.0000 mg | ORAL_TABLET | Freq: Once | ORAL | Status: AC
  Administered 2020-08-07: 10 mg via ORAL
  Filled 2020-08-07: qty 2

## 2020-08-07 MED ORDER — METHYLPREDNISOLONE SODIUM SUCC 125 MG IJ SOLR
60.0000 mg | Freq: Two times a day (BID) | INTRAMUSCULAR | Status: DC
  Administered 2020-08-07 – 2020-08-09 (×4): 60 mg via INTRAVENOUS
  Filled 2020-08-07 (×4): qty 2

## 2020-08-07 NOTE — Evaluation (Signed)
Physical Therapy Evaluation Patient Details Name: Jaime Kelley MRN: [id number] DOB: [birth year] Today's Date: 08/07/2020   History of Present Illness  Pt is a 69 y.o. female with recent admission 07/10/20-07/27/20 with large L-side pleural effusion with d/c to SNF, now readmitted from [facility] 08/06/20 with hypoxia; pt had tested (+) COVID-19 on 10/14. Workup for COVID-19 PNA; chronic hypoxemic respiratory failure. PMH includes COPD (5L O2 baseline), RA, PE/DVT on Coumadin, DM2, HTN, obesity, depression/anxiety; pt vaccinated 10/5.    Clinical Impression  Pt presents with an overall decrease in functional mobility secondary to above. Prior to initial admission 07/10/20, pt was from home and primarily bedbound for "a while" with assist from roommate for mobility/ADLs, pt has been at [facility] for rehab since d/c 10/5; pt reports she has not been out of bed while at [facility]. Today, pt required mod-maxA+2 for bed mobility; required max encouragement to even sit EOB. Limited by weakness, decreased activity tolerance, pain and cognitive impairment, including poor insight into current functional status. SpO2 >/91% on 5L O2 [place]. Pt would benefit from continued acute PT services to maximize functional mobility and independence prior to d/c with continued SNF-level therapies.     Follow Up Recommendations SNF;Supervision/Assistance - 24 hour    Equipment Recommendations   (defer)    Recommendations for Other Services       Precautions / Restrictions Precautions Precautions: Fall;Other (comment) Precaution Comments: Significant WOB at rest; chronic painful/tender L knee Restrictions Weight Bearing Restrictions: No      Mobility  Bed Mobility Overal bed mobility: Needs Assistance Bed Mobility: Supine to Sit;Sit to Supine     Supine to sit: Max assist;+2 for physical assistance;HOB elevated Sit to supine: Mod assist;+2 for physical assistance   General bed mobility comments: Max encouragement to  sit EOB, eventually requiring maxA+2 to initiate movement, scoot hips to EOB and elevate trunk, c/o increased LLE pain with mobility; good initiation for return to supine, modA+2 for LE and trunk management  Transfers                 General transfer comment: pt declined despite max encouragment  Ambulation/Gait                Stairs            Wheelchair Mobility    Modified Rankin (Stroke Patients Only)       Balance Overall balance assessment: Needs assistance Sitting-balance support: Bilateral upper extremity supported;Feet unsupported;Feet supported Sitting balance-[name]: Fair Sitting balance - Comments: Initial minA progressing to min guard to supervision                                     Pertinent Vitals/Pain Pain Assessment: Faces Faces Pain Scale: Hurts even more Pain Location: L knee Pain Descriptors / Indicators: Discomfort;Grimacing;Guarding;Tender Pain Intervention(s): Monitored during session;Limited activity within patient's tolerance;Repositioned    Home Living Family/patient expects to be discharged to:: Skilled nursing facility                 Additional Comments: Prior to initial admission 07/10/20, pt was at home with roommate. Had been primarily "bedbound" for "a while" leading to admission with assist from roommate for all aspects of care, was able to roll self    Prior Function Level of Independence: Needs assistance   Gait / Transfers Assistance Needed: Has not been out of bed since d/c to SNF  on 07/27/20  ADL's / Homemaking Assistance Needed: Assist for bed-level ADL tasks  Comments: Baseline 5L O2 [place] with h/o COPD     Hand Dominance        Extremity/Trunk Assessment   Upper Extremity Assessment Upper Extremity Assessment: Generalized weakness;Defer to OT evaluation    Lower Extremity Assessment Lower Extremity Assessment: Generalized weakness;LLE deficits/detail;Difficult to assess due to  impaired cognition LLE Deficits / Details: h/o L knee pain (pt reports needing TKA); hip and knee functionall <3/5 throughout LLE: Unable to fully assess due to pain LLE Coordination: decreased gross motor;decreased fine motor    Cervical / Trunk Assessment Cervical / Trunk Assessment: Kyphotic  Communication   Communication: No difficulties  Cognition Arousal/Alertness: Awake/alert Behavior During Therapy: Flat affect Overall Cognitive Status: No family/caregiver present to determine baseline cognitive functioning Area of Impairment: Attention;Memory;Following commands;Safety/judgement;Awareness;Problem solving                   Current Attention Level: Sustained Memory: Decreased short-term memory Following Commands: Follows one step commands inconsistently;Follows one step commands with increased time Safety/Judgement: Decreased awareness of safety;Decreased awareness of deficits Awareness: Intellectual Problem Solving: Slow processing;Decreased initiation;Difficulty sequencing;Requires verbal cues General Comments: max encouragement to participate, appears to be self limiting and adamant about not wanting to get OOB or even sit up. States, "Dr. Renard Hamper already cleared me and told me I don't have to do this... come back in a week and I'll be able to... I can when I get home..."      General Comments General comments (skin integrity, edema, etc.): SpO2 91-96% on 5L O2 [place], HR 95; sitting EOB BP 147/120 (unsure reliability since pt moving cuffed arm), return to supine BP 110/70    Exercises Other Exercises Other Exercises: Able to perform RLE heel slides, requiring AAROM for L hip/knee flexion (limited by pain)   Assessment/Plan    PT Assessment Patient needs continued PT services  PT Problem List Decreased activity tolerance;Decreased balance;Decreased mobility;Decreased knowledge of use of DME;Decreased safety awareness;Decreased knowledge of precautions;Cardiopulmonary  status limiting activity;Decreased strength;Pain;Decreased cognition;Decreased range of motion       PT Treatment Interventions DME instruction;Functional mobility training;Therapeutic activities;Therapeutic exercise;Balance training;Patient/family education;Wheelchair mobility training    PT Goals (Current goals can be found in the Care Plan section)  Acute Rehab PT Goals Patient Stated Goal: "I want to get back to everything I was doing before" (including cooking, baking) PT Goal Formulation: With patient Time For Goal Achievement: 08/21/20 Potential to Achieve Goals: Poor    Frequency Min 2X/week   Barriers to discharge        Co-evaluation PT/OT/SLP Co-Evaluation/Treatment: Yes Reason for Co-Treatment: For patient/therapist safety;To address functional/ADL transfers           AM-PAC PT "6 Clicks" Mobility  Outcome Measure Help needed turning from your back to your side while in a flat bed without using bedrails?: A Lot Help needed moving from lying on your back to sitting on the side of a flat bed without using bedrails?: Total Help needed moving to and from a bed to a chair (including a wheelchair)?: Total Help needed standing up from a chair using your arms (e.g., wheelchair or bedside chair)?: Total Help needed to walk in hospital room?: Total Help needed climbing 3-5 steps with a railing? : Total 6 Click Score: 7    End of Session Equipment Utilized During Treatment: Oxygen Activity Tolerance: Patient limited by fatigue;Patient limited by pain Patient left: in bed;with call bell/phone within reach;with  bed alarm set Nurse Communication: Mobility status PT Visit Diagnosis: Muscle weakness (generalized) (M62.81)    Time: 1657-9038 PT Time Calculation (min) (ACUTE ONLY): 32 min   Charges:   PT Evaluation $PT Eval Moderate Complexity: [place], PT, DPT Acute Rehabilitation Services  Pager [phone] Office [place] 08/07/2020, 11:37 AM

## 2020-08-07 NOTE — Progress Notes (Signed)
ANTICOAGULATION CONSULT NOTE - Initial Consult  Pharmacy Consult for Lovenox bridge/warfarin Indication: Hx of VTE  Allergies  Allergen Reactions  . Effexor [Venlafaxine Hydrochloride] Rash    Patient Measurements: Height: 5' (152.4 cm) Weight: 95.3 kg (210 lb) IBW/kg (Calculated) : 45.5  Vital Signs: Temp: 98.1 F (36.7 C) (10/16 0405) Temp Source: Oral (10/16 0405) BP: 125/65 (10/16 0405) Pulse Rate: 83 (10/16 0405)  Labs: Recent Labs    08/06/20 1305 08/06/20 1456 08/06/20 1456 08/06/20 1503 08/06/20 1509 08/06/20 1511 08/06/20 1707 08/07/20 0248  HGB  --  11.6*   < >  --   --  9.8*  --  10.4*  HCT  --  34.0*  --   --   --  34.4*  --  34.8*  PLT  --   --   --   --   --  245  --  262  LABPROT  --   --   --   --  14.5  --   --  15.2  INR  --   --   --   --  1.2  --   --  1.2  CREATININE 0.47  --   --   --   --   --   --  0.50  TROPONINIHS  --   --   --  13  --   --  10  --    < > = values in this interval not displayed.    Estimated Creatinine Clearance: 68.5 mL/min (by C-G formula based on SCr of 0.5 mg/dL).   Medical History: Past Medical History:  Diagnosis Date  . Anxiety   . COPD (chronic obstructive pulmonary disease) ([place])   . Depression   . Diabetes mellitus without complication ([place])   . Discoid lupus    sees Dr. Allyson Sabal  . Dyspnea   . GERD (gastroesophageal reflux disease)   . History of colonic polyps   . Hx pulmonary embolism   . Hypertension   . Hypothyroidism   . Rheumatoid arthritis(714.0)    sees Dr. Dossie Der    Assessment: 77 [place] presenting with low O2 sats, on warfarin PTA for hx of VTE.  Last dose 10/14, INR on admission 1.2. COVID +, D-dimer 5.89>5.09. Pharmacy is consulted to start Lovenox to bridge to therapeutic INR and to dose warfarin. INR remains subtherapeutic at 1.2 today. CBC stable  PTA dosing: 7.5mg  daily  Goal of Therapy:  INR 2-3 Monitor platelets by anticoagulation protocol: Yes   Plan:  Start Lovenox 95 mg (1  mg/kg) subQ every 12 hours Give warfarin 10 mg po x 1 Monitor daily INR, CBC, clinical course, s/sx of bleed, PO intake, DDI F/u- can we change to DOAC?    Thank you for allowing [place] to participate in this patients care.   Jens Som, PharmD Please see amion for complete clinical pharmacist phone list. 08/07/2020 7:47 AM

## 2020-08-07 NOTE — Progress Notes (Addendum)
PROGRESS NOTE                                                                                                                                                                                                             Patient Demographics:    Jaime Kelley, is a 69 y.o. female, DOB - Nov 29, 1950, AYT:[id number]  Admit date - 08/06/2020   Admitting Physician Mckinley Jewel, MD  Outpatient Primary MD for the patient is Laurey Morale, MD  LOS - 1  Chief Complaint  Patient presents with  . Shortness of Breath    COVID +       Brief Narrative (HPI from H&P) - Jaime Kelley is a 69 y.o. female with medical history significant of hypertension, type 2 diabetes mellitus, morbid obesity, depression/anxiety, chronic hypoxemic respiratory failure secondary to underlying COPD-on 5 L of oxygen at baseline, RA, hypothyroidism, history of PE/DVT-on Coumadin, GERD presents to emergency department for evaluation of hypoxia.  She was recently admitted on 9/18 with complaints of substernal chest pressure and was found to have large left-sided pleural effusion with complete collapse of left lung requiring nonrebreather initially.  Patient underwent left pleural space drainage and chest tube placement which was removed on 9/23.  Bronchoscopy was performed on 9/20 with removal of solitary mucous plug.  Repeat chest x-ray showed recurrent opacification.  Pulmonology performed a repeat thoracentesis at bedside on 07/24/2020.  Chest x-ray on 10/5 demonstrated a small amount of reaccumulation of pleural effusion however oxygen requirements improved to 3 L therefore she discharged to SNF in a stable condition and received [place] Covid vaccine on 10/5.    Subjective:    Jaime Kelley today has, No headache, No chest pain, No abdominal pain - No Nausea, No new weakness tingling or numbness, mild SOB.   Assessment  & Plan :     1.  Acute on chronic hypoxic respiratory failure in a  patient with who has COPD is on 5 L of oxygen at home - she was recently admitted with large left-sided pleural effusion requiring thoracentesis and bronchoscopy for removal of solitary mucous plug.  She also recently received Covid vaccine on 07/27/2020.  She is now here with shortness of breath with minimal left-sided pleural effusion and atelectasis.  Her present acute on chronic hypoxia could be due to mild bronchitis or exacerbation of her COPD, doubt she has true COVID-19 pneumonia, for now continue steroids and Remdesivir, she is also chronically on Coumadin with  subtherapeutic INR and quite elevated D-dimer, will check CTA to rule out PE.  Continue supplemental oxygen note she is on 5 L of nasal cannula oxygen at home.     Recent Labs  Lab 08/06/20 1305 08/06/20 1435 08/06/20 1456 08/06/20 1503 08/06/20 1509 08/06/20 1511 08/07/20 0248  WBC  --   --   --   --   --  3.9* 2.2*  HGB  --   --  11.6*  --   --  9.8* 10.4*  HCT  --   --  34.0*  --   --  34.4* 34.8*  PLT  --   --   --   --   --  245 262  CRP 5.5*  --   --   --   --   --  6.6*  BNP  --   --   --  23.3  --   --  38.8  DDIMER 5.89*  --   --   --   --   --  5.09*  PROCALCITON <0.10  --   --   --   --   --   --   AST 21  --   --   --   --   --  18  ALT 13  --   --   --   --   --  15  ALKPHOS 52  --   --   --   --   --  46  BILITOT 0.5  --   --   --   --   --  0.3  ALBUMIN 3.0*  --   --   --   --   --  2.9*  INR  --   --   --   --  1.2  --  1.2  LATICACIDVEN 0.9 1.1  --   --   --   --   --   SARSCOV2NAA POSITIVE*  --   --   --   --   --   --      2.  Underlying COPD with 5 L nasal cannula oxygen at home.  Supportive care.  Continue IV steroids.  3.  Patient expresses her wishes to leave AMA, after counseling she has agreed to stay, last admission she was deemed not to have capacity to make her medical decisions however in my evaluation she seems to have capacity to decide, she is able to process information, knows the  consequences of her actions, knows her alternatives, is awake and alert, will also request psych to evaluate although in my opinion she does have the capacity, ? Waxing and waning status.  On 07/28/2020 after prolonged counseling she has agreed to stay back.  4.  History of DVT.  On Coumadin, subtherapeutic INR, Lovenox overlap.  5.  Anxiety and depression on Zoloft and Xanax.  Continue.  6.  Poor thyroidism.  On Synthroid.  7.  Central hypertension.  On Norvasc and ARB.  9.  Morbid obesity.  BMI 41.  Follow with PCP.   10.  DM type II.  Low-dose Lantus and sliding scale and monitor.  Lab Results  Component Value Date   HGBA1C 6.0 08/14/2017   CBG (last 3)  Recent Labs    08/07/20 0750  GLUCAP 118*      Condition - Extremely Guarded  Family Communication  :    Daughter [phone] - 11/08/19  Son Simon Rhein - [phone] -11/08/2019, extremely argumentative  and rude and slammed the phone down.  Would not let me finish my conversation.  Patient states that son has no right to interfere with her medical decision making.  Code Status :  Full  Consults  : Psych  Procedures  :    PUD Prophylaxis : PPI  Disposition Plan  :    Status is: Inpatient  Remains inpatient appropriate because:IV treatments appropriate due to intensity of illness or inability to take PO   Dispo: The patient is from: Home              Anticipated d/c is to: Home              Anticipated d/c date is: > 3 days              Patient currently is not medically stable to d/c.   DVT Prophylaxis  :  Lovenox >> Coumadin  Lab Results  Component Value Date   INR 1.2 08/07/2020   INR 1.2 08/06/2020   INR 1.3 (H) 07/27/2020     Lab Results  Component Value Date   PLT 262 08/07/2020    Diet :  Diet Order            Diet heart healthy/carb modified Room service appropriate? Yes; Fluid consistency: Thin  Diet effective now                  Inpatient Medications Scheduled Meds: .  aerochamber plus with mask  1 each Other Once  . albuterol  2 puff Inhalation Q6H  . amLODipine  10 mg Oral Daily  . vitamin C  500 mg Oral Daily  . enoxaparin (LOVENOX) injection  1 mg/kg Subcutaneous Q12H  . insulin aspart  0-15 Units Subcutaneous TID WC  . insulin aspart  0-5 Units Subcutaneous QHS  . insulin glargine  10 Units Subcutaneous Daily  . levothyroxine  137 mcg Oral QAC breakfast  . losartan  100 mg Oral Daily  . methylPREDNISolone (SOLU-MEDROL) injection  60 mg Intravenous Q12H  . pantoprazole  40 mg Oral Daily  . sertraline  100 mg Oral Daily  . umeclidinium bromide  1 puff Inhalation Daily  . warfarin  10 mg Oral ONCE-1600  . Warfarin - Pharmacist Dosing Inpatient   Does not apply q1600  . zinc sulfate  220 mg Oral Daily   Continuous Infusions: . sodium chloride    . remdesivir 100 mg in NS 100 mL 100 mg (08/07/20 1010)   PRN Meds:.sodium chloride, acetaminophen, ALPRAZolam, chlorpheniramine-HYDROcodone, guaiFENesin-dextromethorphan, HYDROcodone-acetaminophen, [DISCONTINUED] ondansetron **OR** ondansetron (ZOFRAN) IV  Antibiotics  :   Anti-infectives (From admission, onward)   Start     Dose/Rate Route Frequency Ordered Stop   08/07/20 1000  remdesivir 100 mg in sodium chloride 0.9 % 100 mL IVPB       "Followed by" Linked Group Details   100 mg 200 mL/hr over 30 Minutes Intravenous Daily 08/06/20 1449 08/11/20 0959   08/07/20 1000  remdesivir 100 mg in sodium chloride 0.9 % 100 mL IVPB  Status:  Discontinued       "Followed by" Linked Group Details   100 mg 200 mL/hr over 30 Minutes Intravenous Daily 08/06/20 1503 08/06/20 1506   08/06/20 1700  remdesivir 200 mg in sodium chloride 0.9% 250 mL IVPB       "Followed by" Linked Group Details   200 mg 580 mL/hr over 30 Minutes Intravenous Once 08/06/20 1449 08/07/20 0345   08/06/20 1515  remdesivir 200 mg in sodium chloride 0.9% 250 mL IVPB  Status:  Discontinued       "Followed by" Linked Group Details   200  mg 580 mL/hr over 30 Minutes Intravenous Once 08/06/20 1503 08/06/20 1506          Objective:   Vitals:   08/06/20 2000 08/07/20 0000 08/07/20 0405 08/07/20 0742  BP: 112/68 114/62 125/65 136/72  Pulse: 98 90 83 83  Resp: (!) 22 19 17 19   Temp: 98 F (36.7 C)  98.1 F (36.7 C) 98 F (36.7 C)  TempSrc: Oral  Oral Oral  SpO2: 92% 98% 96% 98%  Weight:      Height:        SpO2: 98 % O2 Flow Rate (L/min): 3 L/min  Wt Readings from Last 3 Encounters:  08/06/20 95.3 kg  07/27/20 81.6 kg  10/24/18 103.1 kg     Intake/Output Summary (Last 24 hours) at 08/07/2020 1035 Last data filed at 08/07/2020 0846 Gross per 24 hour  Intake 1100 ml  Output 650 ml  Net 450 ml     Physical Exam  Awake Alert, oriented x 3, No new F.N deficits,   [place].AT,PERRAL Supple Neck,No JVD, No cervical lymphadenopathy appriciated.  Symmetrical Chest wall movement, Good air movement bilaterally, CTAB RRR,No Gallops,Rubs or new Murmurs, No Parasternal Heave +ve B.Sounds, Abd Soft, No tenderness, No organomegaly appriciated, No rebound - guarding or rigidity. No Cyanosis, Clubbing or edema, No new Rash or bruise      Pressure Injury 07/10/20 Buttocks Left Stage 2 -  Partial thickness loss of dermis presenting as a shallow open injury with a red, pink wound bed without slough. (Active)  07/10/20 2330  Location: Buttocks  Location Orientation: Left  Staging: Stage 2 -  Partial thickness loss of dermis presenting as a shallow open injury with a red, pink wound bed without slough.  Wound Description (Comments):   Present on Admission: Yes     Pressure Injury 07/11/20 Buttocks Left Stage 2 -  Partial thickness loss of dermis presenting as a shallow open injury with a red, pink wound bed without slough. (Active)  07/11/20 2330  Location: Buttocks  Location Orientation: Left  Staging: Stage 2 -  Partial thickness loss of dermis presenting as a shallow open injury with a red, pink wound bed without  slough.  Wound Description (Comments):   Present on Admission: Yes     Data Review:   Recent Labs  Lab 08/06/20 1456 08/06/20 1511 08/07/20 0248  WBC  --  3.9* 2.2*  HGB 11.6* 9.8* 10.4*  HCT 34.0* 34.4* 34.8*  PLT  --  245 262  MCV  --  78.5* 77.9*  MCH  --  22.4* 23.3*  MCHC  --  28.5* 29.9*  RDW  --  Not Measured Not Measured  LYMPHSABS  --  1.1 0.7  MONOABS  --  0.4 0.1  EOSABS  --  0.0 0.0  BASOSABS  --  0.0 0.0    Recent Labs  Lab 08/06/20 1305 08/06/20 1435 08/06/20 1456 08/06/20 1503 08/06/20 1509 08/07/20 0248  NA 128*  --  128*  --   --  131*  K 3.9  --  3.8  --   --  3.9  CL 89*  --   --   --   --  92*  CO2 28  --   --   --   --  30  GLUCOSE 89  --   --   --   --  127*  BUN 7*  --   --   --   --  6*  CREATININE 0.47  --   --   --   --  0.50  CALCIUM 8.4*  --   --   --   --  8.7*  AST 21  --   --   --   --  18  ALT 13  --   --   --   --  15  ALKPHOS 52  --   --   --   --  46  BILITOT 0.5  --   --   --   --  0.3  ALBUMIN 3.0*  --   --   --   --  2.9*  MG  --   --   --   --   --  1.9  CRP 5.5*  --   --   --   --  6.6*  DDIMER 5.89*  --   --   --   --  5.09*  PROCALCITON <0.10  --   --   --   --   --   LATICACIDVEN 0.9 1.1  --   --   --   --   INR  --   --   --   --  1.2 1.2  BNP  --   --   --  23.3  --  38.8    Recent Labs  Lab 08/06/20 1305 08/06/20 1503 08/07/20 0248  CRP 5.5*  --  6.6*  DDIMER 5.89*  --  5.09*  BNP  --  23.3 38.8  PROCALCITON <0.10  --   --   SARSCOV2NAA POSITIVE*  --   --     ------------------------------------------------------------------------------------------------------------------ Recent Labs    08/06/20 1305  TRIG 101    Lab Results  Component Value Date   HGBA1C 6.0 08/14/2017   ------------------------------------------------------------------------------------------------------------------ No results for input(s): TSH, T4TOTAL, T3FREE, THYROIDAB in the last 72 hours.  Invalid input(s):  FREET3 ------------------------------------------------------------------------------------------------------------------ Recent Labs    08/06/20 1305 08/07/20 0248  FERRITIN 225 260    Coagulation profile Recent Labs  Lab 08/06/20 1509 08/07/20 0248  INR 1.2 1.2    Recent Labs    08/06/20 1305 08/07/20 0248  DDIMER 5.89* 5.09*    Cardiac Enzymes No results for input(s): CKMB, TROPONINI, MYOGLOBIN in the last 168 hours.  Invalid input(s): CK ------------------------------------------------------------------------------------------------------------------    Component Value Date/Time   BNP 38.8 08/07/2020 0248    Micro Results Recent Results (from the past 240 hour(s))  Blood Culture (routine x 2)     Status: None (Preliminary result)   Collection Time: 08/06/20  1:00 PM   Specimen: BLOOD  Result Value Ref Range Status   Specimen Description BLOOD RIGHT ANTECUBITAL  Final   Special Requests   Final    BOTTLES DRAWN AEROBIC AND ANAEROBIC Blood Culture results may not be optimal due to an inadequate volume of blood received in culture bottles   Culture   Final    NO GROWTH < 24 HOURS Performed at [facility], [place] [address] [place], [place] [address]    Report Status PENDING  Incomplete  Respiratory Panel by RT PCR (Flu A&B, Covid) - Nasopharyngeal Swab     Status: Abnormal   Collection Time: 08/06/20  1:05 PM   Specimen: Nasopharyngeal Swab  Result Value Ref Range Status   SARS Coronavirus 2 by RT PCR POSITIVE (A) NEGATIVE Final  Comment: emailed L. Berdik RN 14:40 08/06/20 ([email or web address]) (NOTE) SARS-CoV-2 target nucleic acids are DETECTED.  SARS-CoV-2 RNA is generally detectable in upper respiratory specimens  during the acute phase of infection. Positive results are indicative of the presence of the identified virus, but do not rule out bacterial infection or co-infection with other pathogens not detected by the test. Clinical correlation with patient  history and other diagnostic information is necessary to determine patient infection status. The expected result is Negative.  Fact Sheet for Patients:  PinkCheek.be  Fact Sheet for Healthcare Providers: GravelBags.it  This test is not yet approved or cleared by the [place] FDA and  has been authorized for detection and/or diagnosis of SARS-CoV-2 by FDA under an Emergency Use Authorization (EUA).  This EUA will remain in effect (meaning this test can be used) for the duration of  the COVID-19  declaration under Section 564(b)(1) of the Act, 21 U.S.C. section 360bbb-3(b)(1), unless the authorization is terminated or revoked sooner.      Influenza A by PCR NEGATIVE NEGATIVE Final   Influenza B by PCR NEGATIVE NEGATIVE Final    Comment: (NOTE) The Xpert Xpress SARS-CoV-2/FLU/RSV assay is intended as an aid in  the diagnosis of influenza from Nasopharyngeal swab specimens and  should not be used as a sole basis for treatment. Nasal washings and  aspirates are unacceptable for Xpert Xpress SARS-CoV-2/FLU/RSV  testing.  Fact Sheet for Patients: PinkCheek.be  Fact Sheet for Healthcare Providers: GravelBags.it  This test is not yet approved or cleared by the [place] FDA and  has been authorized for detection and/or diagnosis of SARS-CoV-2 by  FDA under an Emergency Use Authorization (EUA). This EUA will remain  in effect (meaning this test can be used) for the duration of the  Covid-19 declaration under Section 564(b)(1) of the Act, 21  U.S.C. section 360bbb-3(b)(1), unless the authorization is  terminated or revoked. Performed at [facility], [place] [address], [place], [place] [address]   Blood Culture (routine x 2)     Status: None (Preliminary result)   Collection Time: 08/06/20  1:22 PM   Specimen: BLOOD RIGHT WRIST  Result Value Ref Range  Status   Specimen Description BLOOD RIGHT WRIST  Final   Special Requests   Final    BOTTLES DRAWN AEROBIC AND ANAEROBIC Blood Culture results may not be optimal due to an inadequate volume of blood received in culture bottles   Culture   Final    NO GROWTH < 24 HOURS Performed at [facility], [place] [address] [place], [place] [address]    Report Status PENDING  Incomplete  MRSA PCR Screening     Status: None   Collection Time: 08/07/20  6:23 AM   Specimen: Nasal Mucosa; Nasopharyngeal  Result Value Ref Range Status   MRSA by PCR NEGATIVE NEGATIVE Final    Comment:        The GeneXpert MRSA Assay (FDA approved for NASAL specimens only), is one component of a comprehensive MRSA colonization surveillance program. It is not intended to diagnose MRSA infection nor to guide or monitor treatment for MRSA infections. Performed at [facility], [place] [address], [place], [place] [address]     Radiology Reports DG Chest 2 View  Result Date: 07/26/2020 CLINICAL DATA:  Left pleural effusion EXAM: CHEST - 2 VIEW COMPARISON:  07/24/2020 FINDINGS: Small left pleural effusion is increased. Left basilar atelectasis is present. No pneumothorax. Background changes of emphysema. Stable cardiomediastinal  contours. IMPRESSION: Small left pleural effusion increased since 07/24/2020. Mild left basilar atelectasis. Electronically Signed   By: Macy Mis M.D.   On: 07/26/2020 08:07   CT Head Wo Contrast  Result Date: 07/10/2020 CLINICAL DATA:  Delirium EXAM: CT HEAD WITHOUT CONTRAST TECHNIQUE: Contiguous axial images were obtained from the base of the skull through the vertex without intravenous contrast. COMPARISON:  None. FINDINGS: Brain: Normal anatomic configuration. Parenchymal volume loss is advanced in relation to the patient's age. Moderate periventricular white matter changes are present likely reflecting the sequela of small vessel ischemia. Remote left pontine infarct. No abnormal  intra or extra-axial mass lesion or fluid collection. No abnormal mass effect or midline shift. No evidence of acute intracranial hemorrhage or infarct. Borderline ventriculomegaly is commensurate with the degree of cerebral atrophy and likely reflects central atrophy. Cerebellum unremarkable. Vascular: No asymmetric hyperdense vasculature at the skull base. Skull: Intact Sinuses/Orbits: Moderate mucosal thickening and air-fluid level noted within the a sphenoid sinus. Osteoma noted within the left frontal sinus. Remaining paranasal sinuses are clear. Orbits are unremarkable. Other: There is near complete fluid opacification of the mastoid air cells and middle ear cavities bilaterally. No definite superimposed osseous erosion. IMPRESSION: No evidence of acute intracranial hemorrhage or infarct. Relatively advanced senescent changes.  Remote left pontine infarct. Extensive fluid opacification of the mastoid air cells and middle ear cavities. Correlation for otomastoiditis is recommended. Sphenoid sinusitis. Electronically Signed   By: Fidela Salisbury MD   On: 07/10/2020 19:35   CT CHEST WO CONTRAST  Result Date: 07/23/2020 CLINICAL DATA:  Abnormal radiograph, pleural effusion EXAM: CT CHEST WITHOUT CONTRAST TECHNIQUE: Multidetector CT imaging of the chest was performed following the standard protocol without IV contrast. COMPARISON:  Radiograph 07/21/2020, CT 07/10/2020 FINDINGS: Cardiovascular: Cardiac size within normal limits. Small volume pericardial fluid is similar to prior. Dense calcifications along the mitral annulus. Additional calcifications of the aortic leaflets. Hypoattenuation of the cardiac blood pool compatible with anemia. Extensive three-vessel native coronary artery atherosclerosis. Atherosclerotic plaque within the normal caliber aorta. No hyperdense mural thickening or plaque displacement nor periaortic stranding or hemorrhage. Shared origin of the brachiocephalic and left common carotid  arteries. Additional calcifications seen throughout the proximal great vessels. Central pulmonary arteries are normal caliber. Luminal evaluation precluded in the absence of contrast media. No visible worrisome venous abnormalities. Mediastinum/Nodes: Persistent slight leftward mediastinal shift, similar to comparison. No mediastinal fluid, gas or hemorrhage. Redemonstration of multiple asymmetrically prominent though non pathologically enlarged left axillary and mediastinal lymph nodes. No right axillary adenopathy. Hilar nodal is severely limited in the absence of contrast media. No acute abnormality of the trachea or esophagus. Thyroid gland is diminutive, correlate for prior thyroidectomy or ablation. Lungs/Pleura: Background of moderate centrilobular emphysematous changes throughout both lungs. There is a moderate residual left pleural effusion with adjacent areas of passive atelectasis in the left lung base. Some additional thickening is noted along the left major fissure which may reflect some persistent loculated fluid. Adjacent atelectatic changes are present along the left major fissure as well. Predominantly involving the lingula and posterior segment left upper lobe. Underlying consolidation would be difficult to exclude. Some there is a stable sub solid opacity in the posterosuperior segment right lower lobe (4/74 measuring up to 8 mm. Possibly post infectious or inflammatory. Stable scarring within the anterior basal segment right lower lobe and major fissure. Right lung is otherwise clear. Upper Abdomen: Multiple calcified gallstones within the largely decompressed gallbladder. Scattered colonic diverticula without focal inflammation to  suggest diverticulitis. Extensive upper abdominal atherosclerotic calcification. Stable likely parapelvic cyst in the upper pole left kidney. Musculoskeletal: Mild body wall edema, left slightly more pronounced than right. No acute abnormality or worrisome osseous  lesions. Levocurvature of the upper thoracic spine with some mild dextrocurvature of the mid to lower levels. Multilevel discogenic and facet degenerative changes are present. Additional degenerative changes of the right shoulder and sternoclavicular joints similar to prior. IMPRESSION: 1. Moderate residual left pleural effusion with adjacent areas of passive atelectasis in the left lung base. Some additional thickening along the left major fissure may reflect some persistent loculated fluid with some further atelectatic change in the lingula as well. Underlying consolidation within the atelectatic lung would cannot be fully excluded. 2. Stable appearance of an 8 mm ground-glass opacity in the posterosuperior segment right lower lobe. Possibly post infectious or inflammatory. Recommend attention on follow-up imaging. Initial follow-up with CT at 6-12 months is recommended to confirm persistence. If persistent, repeat CT is recommended every 2 years until 5 years of stability has been established. This recommendation follows the consensus statement: Guidelines for Management of Incidental Pulmonary Nodules Detected on CT Images: From the Fleischner Society 2017; Radiology 2017; 284:228-243. 3. Redemonstration of multiple asymmetrically prominent though non pathologically enlarged left axillary and mediastinal lymph nodes, nonspecific though possibly reactive or edematous. 4. Cholelithiasis without evidence of acute cholecystitis. 5. Colonic diverticulosis without evidence of diverticulitis. 6. Aortic Atherosclerosis (ICD10-I70.0) 7. Three-vessel coronary artery calcifications. 8. Emphysema (ICD10-J43.9). Electronically Signed   By: Lovena Le M.D.   On: 07/23/2020 22:26   CT Chest W Contrast  Result Date: 07/10/2020 CLINICAL DATA:  Pleural effusion EXAM: CT CHEST WITH CONTRAST TECHNIQUE: Multidetector CT imaging of the chest was performed during intravenous contrast administration. CONTRAST:  50mL OMNIPAQUE  IOHEXOL 300 MG/ML  SOLN COMPARISON:  None. FINDINGS: Cardiovascular: There is extensive coronary artery calcification. Global cardiac size within normal limits. Left ventricular hypertrophy noted. Moderate calcification of the mitral valve annulus. No pericardial effusion. The central pulmonary arteries are of normal caliber. The thoracic aorta is of normal caliber. There is extensive atherosclerotic calcifications seen within the arch vasculature proximally, particularly the left subclavian artery, however, the degree of stenosis is not well assessed on this non arteriographic study. Scattered mild atherosclerotic calcifications seen within the descending thoracic aorta., Mediastinum/Nodes: No pathologic adenopathy. Thyroid unremarkable. Esophagus unremarkable. Lungs/Pleura: A large left pleural effusion is present with complete opacification of the left hemithorax and complete collapse of the left lung. There is debris seen within the left mainstem bronchus. Moderate emphysema involving the aerated right lung. No superimposed focal pulmonary infiltrate or nodule. No pneumothorax or pleural effusion on the right. Upper Abdomen: No acute abnormality. Musculoskeletal: Osseous structures are age-appropriate. No acute bone abnormality. IMPRESSION: Large left pleural effusion with complete collapse of the left lung. Moderate debris within the left mainstem bronchus. Extensive coronary artery calcification. Peripheral vascular disease with extensive calcification of the origin of the left subclavian artery. If there is suggestion of hemodynamically significant stenosis (subclavian steal, left upper extremity claudication), this would be better assessed with CT arteriography. Aortic Atherosclerosis (ICD10-I70.0) and Emphysema (ICD10-J43.9). Electronically Signed   By: Fidela Salisbury MD   On: 07/10/2020 19:45   DG Chest Port 1 View  Result Date: 08/06/2020 CLINICAL DATA:  COVID-19 positive with shortness of breath  EXAM: PORTABLE CHEST 1 VIEW COMPARISON:  July 26, 2020 FINDINGS: There is left lower lobe airspace opacity with small left pleural effusion. The lungs elsewhere are clear.  The heart is upper normal in size with pulmonary vascularity normal. No adenopathy. There is aortic atherosclerosis. No bone lesions. IMPRESSION: Small left pleural effusion. Airspace opacity in the left lower lobe concerning for pneumonia with likely associated atelectasis. Lungs elsewhere clear. Stable cardiac silhouette. Aortic Atherosclerosis (ICD10-I70.0). Electronically Signed   By: Lowella Grip III M.D.   On: 08/06/2020 13:31   DG CHEST PORT 1 VIEW  Result Date: 07/24/2020 CLINICAL DATA:  Status post left thoracentesis EXAM: PORTABLE CHEST 1 VIEW COMPARISON:  07/21/2020 FINDINGS: Trace left pleural effusion, improved status post thoracentesis. No pneumothorax is seen. Right lung is essentially clear. The heart is normal in size.  Thoracic aortic atherosclerosis. IMPRESSION: Trace left pleural effusion, improved status post thoracentesis. No pneumothorax is seen. Electronically Signed   By: Julian Hy M.D.   On: 07/24/2020 13:01   DG Chest Port 1 View  Result Date: 07/21/2020 CLINICAL DATA:  Shortness of breath EXAM: PORTABLE CHEST 1 VIEW COMPARISON:  July 15, 2020 FINDINGS: There is consolidation throughout the left mid and lower lung regions with questionable superimposed left pleural effusion. The right lung is clear. Heart is upper normal in size with pulmonary vascularity normal. No adenopathy. There is aortic atherosclerosis. No bone lesions. IMPRESSION: Consolidation throughout much of the left mid and lower lung regions with suspected small pleural effusions superimposed on the left. Right lung clear. Heart upper normal in size. No adenopathy. Aortic Atherosclerosis (ICD10-I70.0). Electronically Signed   By: Lowella Grip III M.D.   On: 07/21/2020 14:53   DG Chest Port 1 View  Result Date:  07/15/2020 CLINICAL DATA:  Left chest tube EXAM: PORTABLE CHEST 1 VIEW COMPARISON:  07/14/2020 FINDINGS: Slight retraction of the left small bore chest tube. No significant pneumothorax by plain radiography. Persistent left basilar collapse/consolidation. No enlarging effusion. Stable right lung aeration. Heart remains enlarged. Aorta atherosclerotic.  Degenerative changes noted of the spine. IMPRESSION: Slight retraction of the left chest tube. No current significant pneumothorax by plain radiography Persistent left lower lung collapse/consolidation. Aortic Atherosclerosis (ICD10-I70.0). Electronically Signed   By: Jerilynn Mages.  Shick M.D.   On: 07/15/2020 08:00   DG Chest Port 1 View  Result Date: 07/14/2020 CLINICAL DATA:  Chest tube present EXAM: PORTABLE CHEST 1 VIEW COMPARISON:  July 14, 2020 study obtained earlier in the day. FINDINGS: Chest tube on the left is unchanged in position. No evident pneumothorax. There is bibasilar atelectatic change, somewhat more on the left than on the right, stable. No new opacity evident. Heart is upper normal in size with pulmonary vascularity normal. No adenopathy. There is aortic atherosclerosis. No bone lesions. There is bilateral carotid artery calcification. IMPRESSION: Chest tube unchanged on the left without evident pneumothorax. Lower lung region atelectasis is stable. No new opacity. Stable cardiac silhouette. Aortic Atherosclerosis (ICD10-I70.0). There is bilateral carotid artery calcification. Electronically Signed   By: Lowella Grip III M.D.   On: 07/14/2020 13:25   DG Chest Port 1 View  Result Date: 07/14/2020 CLINICAL DATA:  Chest tube surveillance EXAM: PORTABLE CHEST 1 VIEW COMPARISON:  07/13/2020 FINDINGS: Left-sided chest tube remains in place. Stable cardiomegaly. Aortic atherosclerosis. Suspect small residual left-sided pleural effusion. Persistent bibasilar atelectasis. No pneumothorax is seen. IMPRESSION: 1. Left-sided chest tube remains in  place. No pneumothorax. 2. Persistent bibasilar atelectasis and probable small residual left pleural effusion. Electronically Signed   By: Davina Poke D.O.   On: 07/14/2020 08:18   DG Chest Port 1 View  Result Date: 07/13/2020 CLINICAL DATA:  69 year old  female with pleural effusion, chest tube placed. EXAM: PORTABLE CHEST 1 VIEW COMPARISON:  Portable chest 07/12/2020 and earlier. FINDINGS: Portable AP semi upright view at 1222 hours. Left chest tube position has not significantly changed. No pneumothorax. No residual pleural effusion is evident now. Mild patchy residual opacity at the left lung base. Mediastinal contours are within normal limits. Allowing for portable technique the right lung appears clear. Negative trachea. No acute osseous abnormality identified. IMPRESSION: 1. Stable left chest tube. Residual atelectasis but no pneumothorax or residual pleural effusion. 2. No new cardiopulmonary abnormality. Electronically Signed   By: Genevie Ann M.D.   On: 07/13/2020 12:41   DG CHEST PORT 1 VIEW  Result Date: 07/12/2020 CLINICAL DATA:  Left pleural effusion EXAM: PORTABLE CHEST 1 VIEW COMPARISON:  07/11/2020 FINDINGS: Decreasing left pleural effusion with left chest tube in place. Moderate to large left effusion remains with improved aeration in the left lung. Continued left lower lobe atelectasis or infiltrate. No confluent opacity on the right. No pneumothorax. Aortic atherosclerosis. IMPRESSION: Decreasing left effusion. The left effusion remains moderate to large with left lower lobe atelectasis or infiltrate. Left chest tube in place.  No visible pneumothorax. Electronically Signed   By: Rolm Baptise M.D.   On: 07/12/2020 15:56   DG CHEST PORT 1 VIEW  Result Date: 07/11/2020 CLINICAL DATA:  Status post thoracocentesis EXAM: PORTABLE CHEST 1 VIEW COMPARISON:  July 10, 2020 FINDINGS: The cardiomediastinal silhouette is completely obscured.Persistent complete opacification of the LEFT  hemithorax. Large LEFT pleural effusion. No significant pneumothorax. RIGHT lung is clear. Visualized abdomen is unremarkable. Multilevel degenerative changes of the thoracic spine. IMPRESSION: Persistent complete opacification of the LEFT hemithorax. No significant pneumothorax. Electronically Signed   By: Valentino Saxon MD   On: 07/11/2020 10:30   DG Chest Portable 1 View  Result Date: 07/10/2020 CLINICAL DATA:  Chest pain, shortness of breath, COPD EXAM: PORTABLE CHEST 1 VIEW COMPARISON:  01/18/2017 FINDINGS: There is total opacification of the left hemithorax, which is incompletely imaged due to angulation of radiograph and exclusion inferiorly. There is significant volume loss with leftward shift of the trachea and mediastinal contents. The right lung is normally aerated. IMPRESSION: There is total opacification of the left hemithorax, which is incompletely imaged due to angulation of radiograph and exclusion inferiorly. There is significant volume loss with leftward shift of the trachea and mediastinal contents. Findings are consistent with a large pleural effusion and total atelectasis or consolidation of the left lung, and generally concerning for malignancy. Recommend CT to further evaluate. Electronically Signed   By: Eddie Candle M.D.   On: 07/10/2020 18:25    Time Spent in minutes  30   Lala Lund M.D on 08/07/2020 at 10:35 AM  To page go to [email or web address] - password [facility]

## 2020-08-07 NOTE — Evaluation (Signed)
Occupational Therapy Evaluation Patient Details Name: Jaime Kelley MRN: [id number] DOB: 1951/09/24 Today's Date: 08/07/2020    History of Present Illness Pt is a 69 y.o. female with recent admission 07/10/20-07/27/20 with large L-side pleural effusion with d/c to SNF, now readmitted from [facility] 08/06/20 with hypoxia; pt had tested (+) COVID-19 on 10/14. Workup for COVID-19 PNA; chronic hypoxemic respiratory failure. PMH includes COPD (5L O2 baseline), RA, PE/DVT on Coumadin, DM2, HTN, obesity, depression/anxiety; pt vaccinated 10/5.   Clinical Impression   Pt from home, bed bound completing ADL with assist of roommate. Pt with recent hospitalization and stay at SNF (where she did not get OOB). Pt today is max A +2 for bed mobility - reluctant to getting EOB - limited by L knee pain "I need a new knee". She was able to progress from min A to min guard seated EOB with participation in grooming activities. When therapy tried to encourage OOB to chair with our assitance, she immediately started laying back down and required mod A +2 to return supine. Limited by weakness, decreased activity tolerance, pain and cognitive impairment, including poor insight into current functional status. SpO2 >/91% on 5L O2 [place]. Pt will benefit from skilled OT in the acute setting as well as afterwards at the SNF level to maximize safety and independence in ADL and functional transfer. Next session to focus on OOB (bring stedy?) or engaging Pt in HEP EOB/ functional ADL for activity tolerance.    Follow Up Recommendations  SNF    Equipment Recommendations    defer to next venue of care   Recommendations for Other Services        Precautions / Restrictions Precautions Precautions: Fall;Other (comment) Precaution Comments: Significant WOB at rest; chronic painful/tender L knee ("I am supposed to have that knee replaced") Restrictions Weight Bearing Restrictions: No      Mobility Bed Mobility Overal  bed mobility: Needs Assistance Bed Mobility: Supine to Sit;Sit to Supine     Supine to sit: Max assist;+2 for physical assistance;HOB elevated Sit to supine: Mod assist;+2 for physical assistance   General bed mobility comments: Max encouragement to sit EOB, eventually requiring maxA+2 to initiate movement, scoot hips to EOB and elevate trunk, c/o increased LLE pain with mobility; good initiation for return to supine, modA+2 for LE and trunk management  Transfers                 General transfer comment: pt declined despite max encouragment    Balance Overall balance assessment: Needs assistance Sitting-balance support: Bilateral upper extremity supported;Feet unsupported;Feet supported Sitting balance-[name]: Fair Sitting balance - Comments: Initial minA progressing to min guard to supervision                                   ADL either performed or assessed with clinical judgement   ADL Overall ADL's : Needs assistance/impaired Eating/Feeding: Set up   Grooming: Wash/dry face;Oral care;Set up;Sitting Grooming Details (indicate cue type and reason): EOB, Pt required max A to perform oral care, hair is matted in back and states that she wants her son to shave her head Upper Body Bathing: Moderate assistance;Sitting   Lower Body Bathing: Total assistance;Bed level   Upper Body Dressing : Moderate assistance;Sitting   Lower Body Dressing: Total assistance;Bed level     Toilet Transfer Details (indicate cue type and reason): pt declined OOB transfer, layed down from sitting  EOB when suggested Toileting- Clothing Manipulation and Hygiene: Total assistance;Bed level       Functional mobility during ADLs: Total assistance General ADL Comments: MAx A for patient to participate in therapy today. pt reports she wants to go home as she has a hospital bed and her roommate will assist her with ADLs. pt reports she plans to urinate on "bed pads" and her  rooommate with assist with bed baths. she states her roommate is in good health. She wants a HH aide     Vision Baseline Vision/History: Wears glasses Wears Glasses: At all times Patient Visual Report: No change from baseline       Perception     Praxis      Pertinent Vitals/Pain Pain Assessment: Faces Faces Pain Scale: Hurts even more Pain Location: L knee Pain Descriptors / Indicators: Discomfort;Grimacing;Guarding;Tender Pain Intervention(s): Monitored during session;Repositioned;Limited activity within patient's tolerance     Hand Dominance Right   Extremity/Trunk Assessment Upper Extremity Assessment Upper Extremity Assessment: Generalized weakness (baseline arthritis)   Lower Extremity Assessment Lower Extremity Assessment: Defer to PT evaluation LLE Deficits / Details: h/o L knee pain (pt reports needing TKA); hip and knee functionall <3/5 throughout LLE: Unable to fully assess due to pain LLE Coordination: decreased gross motor;decreased fine motor   Cervical / Trunk Assessment Cervical / Trunk Assessment: Kyphotic   Communication Communication Communication: No difficulties   Cognition Arousal/Alertness: Awake/alert Behavior During Therapy: Flat affect Overall Cognitive Status: No family/caregiver present to determine baseline cognitive functioning Area of Impairment: Attention;Memory;Following commands;Safety/judgement;Awareness;Problem solving                   Current Attention Level: Sustained Memory: Decreased short-term memory Following Commands: Follows one step commands inconsistently;Follows one step commands with increased time Safety/Judgement: Decreased awareness of safety;Decreased awareness of deficits Awareness: Intellectual Problem Solving: Slow processing;Decreased initiation;Difficulty sequencing;Requires verbal cues General Comments: max encouragement to participate, appears to be self limiting and adamant about not wanting to get  OOB or even sit up. States, "Dr. Renard Hamper already cleared me and told me I don't have to do this... come back in a week and I'll be able to... I can when I get home..."   General Comments  SpO2 91-96% on 5L O2 [place], HR 95; sitting EOB BP 147/120 (unsure reliability since pt moving cuffed arm), return to supine BP 110/70    Exercises Other Exercises Other Exercises: Able to perform RLE heel slides, requiring AAROM for L hip/knee flexion (limited by pain)   Shoulder Instructions      Home Living Family/patient expects to be discharged to:: Skilled nursing facility                                 Additional Comments: Prior to initial admission 07/10/20, pt was at home with roommate. Had been primarily "bedbound" for "a while" leading to admission with assist from roommate for all aspects of care, was able to roll self      Prior Functioning/Environment Level of Independence: Needs assistance  Gait / Transfers Assistance Needed: Has not been out of bed since d/c to SNF on 07/27/20 ADL's / Homemaking Assistance Needed: Assist for bed-level ADL tasks   Comments: Baseline 5L O2  with h/o COPD        OT Problem List: Decreased strength;Decreased range of motion;Decreased activity tolerance;Impaired balance (sitting and/or standing);Decreased safety awareness;Decreased cognition;Pain;Obesity      OT Treatment/Interventions: Self-care/ADL  training;Therapeutic exercise;Energy conservation;DME and/or AE instruction;Therapeutic activities;Balance training    OT Goals(Current goals can be found in the care plan section) Acute Rehab OT Goals Patient Stated Goal: "I want to get back to everything I was doing before" (including cooking, baking) OT Goal Formulation: With patient Time For Goal Achievement: 08/21/20 Potential to Achieve Goals: Fair ADL Goals Pt Will Perform Grooming: with set-up;sitting Pt Will Perform Upper Body Bathing: with set-up;sitting Pt Will Perform Lower Body  Bathing: with min assist;bed level Pt Will Transfer to Toilet: with max assist;squat pivot transfer;bedside commode Pt Will Perform Toileting - Clothing Manipulation and hygiene: with max assist;sitting/lateral leans Pt/caregiver will Perform Home Exercise Program: Right Upper extremity;Left upper extremity;Independently;With written HEP provided Additional ADL Goal #1: Pt will perform bed mobility at min A prior to engaging in ADL  OT Frequency: Min 2X/week   Barriers to D/C: Decreased caregiver support  Pt dependent on roommate       Co-evaluation PT/OT/SLP Co-Evaluation/Treatment: Yes Reason for Co-Treatment: For patient/therapist safety;To address functional/ADL transfers PT goals addressed during session: Balance;Strengthening/ROM OT goals addressed during session: ADL's and self-care;Strengthening/ROM      AM-PAC OT "6 Clicks" Daily Activity     Outcome Measure Help from another person eating meals?: A Little Help from another person taking care of personal grooming?: A Lot Help from another person toileting, which includes using toliet, bedpan, or urinal?: Total Help from another person bathing (including washing, rinsing, drying)?: A Lot Help from another person to put on and taking off regular upper body clothing?: A Lot Help from another person to put on and taking off regular lower body clothing?: Total 6 Click Score: 11   End of Session Equipment Utilized During Treatment: Oxygen (5L) Nurse Communication: Mobility status  Activity Tolerance: Patient limited by fatigue Patient left: in bed;with call bell/phone within reach;with bed alarm set  OT Visit Diagnosis: Unsteadiness on feet (R26.81);Other abnormalities of gait and mobility (R26.89);Muscle weakness (generalized) (M62.81);Adult, failure to thrive (R62.7) Pain - Right/Left: Left Pain - part of body: Knee                Time: 7482-7078 OT Time Calculation (min): 32 min Charges:  OT General Charges $OT Visit:  1 Visit OT Evaluation $OT Eval Moderate Complexity: [place] OTR/L Acute Rehabilitation Services Pager: [phone] Office: [place] 08/07/2020, 12:37 PM

## 2020-08-07 NOTE — Consult Note (Signed)
[facility] Face-to-Face Psychiatry Consult   Reason for Consult: Capacity Referring Physician: Dr. Candiss Norse Patient Identification: Jaime Kelley MRN:  [id number] Principal Diagnosis: Pneumonia due to COVID-19 virus Diagnosis:  Principal Problem:   Pneumonia due to COVID-19 virus Active Problems:   Adjustment disorder with anxious mood   Hypothyroidism   COPD (chronic obstructive pulmonary disease) ([place])   GERD   PULMONARY EMBOLISM, HX OF   Hypertension   Type 2 diabetes mellitus ([place])   Hyponatremia   Obesity   Total Time spent with patient: 45 minutes  Subjective:  "Can you open that Diet Coke for me" as she points to her bedside table where no drink exists.  She does have a styrofoam cup of water.  Does not acknowledge it is not there.  Upset that her nurse is taking her clothes, insisted I open her closet and see there are no clothes there.  Her RN is a female who confirms her confusion is ongoing with her demanding Diet Cokes from her refrigerator that is full outside her room (this does not exist).  She wants to return home despite her roommate's inability to care for her.  She is not able to walk evidently as she decided not to get out of bed in the past 1.5 years, atrophy per daughter.  No medical reason.  Patient does not understand the severity of her medical issues and her inability to care for herself.  Short of breath on assessment at times.  She is also convinced she has bronchitis and not COVID.  When discussed her family's desire for her to stay in a facility to receive care, "They just don't want to be bothered with me."  Patient does NOT have capacity at this time to make her own medical decisions.  Past consult on 9/25: Jaime Kelley is a 69 y.o. female patient admitted with shortness of breath on September 18.  She was found to be anemic with a hemoglobin of 6.8, pleural effusion resulting in thoracentesis 1300 cc removed.  Psychiatric consult was placed for competency in the  setting of depression, and patient has been refusing to go to a skilled nursing facility.  Patient states she came to the hospital because she wanted pain medication and to be able to breath.  She reports she lives at home with a roommate, has a home health aide that comes over to visit.  Patient denies any previous psychiatric history, denies any previous inpatient admission, and denies any previous suicide attempts.  Per chart review patient has a history of depression, has been taking Zoloft as a home medication.  Given her current living situation, current medical condition, and level of care needed to assist this writer was able to complete decision-making capacity assessment.   HPI:  69 year old female with past medical history of obesity, COPD with chronic respiratory failure on 5 L nasal cannula, diabetes mellitus, rheumatoid arthritis, hypertension and PE on chronic Coumadin who presented to the emergency room with shortness of breath on 9/18. Patient found to be anemic with a hemoglobin of 6.8, was at 13 3 years ago on her chart, subtherapeutic INR of 1.1 and a large left-sided pleural effusion causing near complete collapse of her left lung and requiring a nonrebreather to keep her oxygen saturations greater than 90%. Pulmonary consulted for thoracentesis and patient was admitted to the hospitalist service.  Patient continues to show signs of improvement. Follow-up chest x-ray notes reinflation of the lung. Had extensive conversation with patient's daughter, who lives  in [place], on 9/21. Apparently, the patient lives in a townhome with a roommate. She has not walked in well over a year although it is from atrophy and deconditioning, not a medical condition. According to the patient, she has a roommate who cleans her when she has to use the bathroom, as she wears diapers. The daughter suspects patient is in a hoarding type situation. The patient affirms that the roommate could not lift her up  and can barely care for herself. She does not want to go to a skilled nursing facility.  Past Psychiatric History: Depression taking Zoloft 100 mg p.o. nightly.  She does not have any current outpatient provider.  She does not report any recent or previous inpatient admission.  She denies any previous suicide attempts.  She denies any current substance use and or legal charges.  Urine drug screen not obtained on admission.  Risk to Self:  Denies Risk to Others:  Denies Prior Inpatient Therapy:  Denies Prior Outpatient Therapy:  Denies  Past Medical History:  Past Medical History:  Diagnosis Date  . Anxiety   . COPD (chronic obstructive pulmonary disease) ([place])   . Depression   . Diabetes mellitus without complication ([place])   . Discoid lupus    sees Dr. Allyson Sabal  . Dyspnea   . GERD (gastroesophageal reflux disease)   . History of colonic polyps   . Hx pulmonary embolism   . Hypertension   . Hypothyroidism   . Rheumatoid arthritis(714.0)    sees Dr. Dossie Der     Past Surgical History:  Procedure Laterality Date  . ABDOMINAL HYSTERECTOMY    . arthroscopy right knee    . BREAST EXCISIONAL BIOPSY Right 2011  . carpal tunnel release left wrist    . CHEST TUBE INSERTION Left 07/12/2020   Procedure: INSERTION PLEURAL DRAINAGE CATHETER;  Surgeon: Garner Nash, DO;  Location: [facility];  Service: Pulmonary;  Laterality: Left;  . 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 TONSILLECTOMY    . VIDEO BRONCHOSCOPY N/A 07/12/2020   Procedure: VIDEO BRONCHOSCOPY WITHOUT FLUORO;  Surgeon: Garner Nash, DO;  Location: [facility];  Service: Pulmonary;  Laterality: N/A;   Family History:  Family History  Problem Relation Age of Onset  . Arthritis Other   . Coronary artery disease Other   . Hypertension Other   . Stroke Other   . Cancer Other        lung  . Lymphoma Son    Family Psychiatric  History: Denies Social History:  Social History   Substance and Sexual Activity   Alcohol Use No  . Alcohol/week: 0.0 standard drinks     Social History   Substance and Sexual Activity  Drug Use No    Social History   Socioeconomic History  . Marital status: Divorced    Spouse name: Not on file  . Number of children: Not on file  . Years of education: Not on file  . Highest education level: Not on file  Occupational History  . Not on file  Tobacco Use  . Smoking status: Former Smoker    Packs/day: 1.00    Years: 40.00    Pack years: 40.00    Types: Cigarettes    Quit date: 01/18/2017    Years since quitting: 3.5  . Smokeless tobacco: Never Used  Substance and Sexual Activity  . Alcohol use: No    Alcohol/week: 0.0 standard drinks  . Drug use: No  . Sexual  activity: Not on file  Other Topics Concern  . Not on file  Social History Narrative  . Not on file   Social Determinants of Health   Financial Resource Strain:   . Difficulty of Paying Living Expenses: Not on file  Food Insecurity:   . Worried About Charity fundraiser in the Last Year: Not on file  . Ran Out of Food in the Last Year: Not on file  Transportation Needs:   . Lack of Transportation (Medical): Not on file  . Lack of Transportation (Non-Medical): Not on file  Physical Activity:   . Days of Exercise per Week: Not on file  . Minutes of Exercise per Session: Not on file  Stress:   . Feeling of Stress : Not on file  Social Connections:   . Frequency of Communication with Friends and Family: Not on file  . Frequency of Social Gatherings with Friends and Family: Not on file  . Attends Religious Services: Not on file  . Active Member of Clubs or Organizations: Not on file  . Attends Archivist Meetings: Not on file  . Marital Status: Not on file   Additional Social History:    Allergies:   Allergies  Allergen Reactions  . Effexor [Venlafaxine Hydrochloride] Rash    Labs:  Results for orders placed or performed during the hospital encounter of 08/06/20 (from  the past 48 hour(s))  Blood Culture (routine x 2)     Status: None (Preliminary result)   Collection Time: 08/06/20  1:00 PM   Specimen: BLOOD  Result Value Ref Range   Specimen Description BLOOD RIGHT ANTECUBITAL    Special Requests      BOTTLES DRAWN AEROBIC AND ANAEROBIC Blood Culture results may not be optimal due to an inadequate volume of blood received in culture bottles   Culture      NO GROWTH < 24 HOURS Performed at [facility] [address], [place], [place] [address]    Report Status PENDING   Respiratory Panel by RT PCR (Flu A&B, Covid) - Nasopharyngeal Swab     Status: Abnormal   Collection Time: 08/06/20  1:05 PM   Specimen: Nasopharyngeal Swab  Result Value Ref Range   SARS Coronavirus 2 by RT PCR POSITIVE (A) NEGATIVE    Comment: emailed L. Berdik RN 14:40 08/06/20 ([email or web address]) (NOTE) SARS-CoV-2 target nucleic acids are DETECTED.  SARS-CoV-2 RNA is generally detectable in upper respiratory specimens  during the acute phase of infection. Positive results are indicative of the presence of the identified virus, but do not rule out bacterial infection or co-infection with other pathogens not detected by the test. Clinical correlation with patient history and other diagnostic information is necessary to determine patient infection status. The expected result is Negative.  Fact Sheet for Patients:  PinkCheek.be  Fact Sheet for Healthcare Providers: GravelBags.it  This test is not yet approved or cleared by the [place] FDA and  has been authorized for detection and/or diagnosis of SARS-CoV-2 by FDA under an Emergency Use Authorization (EUA).  This EUA will remain in effect (meaning this test can be used) for the duration of  the COVID-19  declaration under Section 564(b)(1) of the Act, 21 U.S.C. section 360bbb-3(b)(1), unless the authorization is terminated or revoked sooner.      Influenza A  by PCR NEGATIVE NEGATIVE   Influenza B by PCR NEGATIVE NEGATIVE    Comment: (NOTE) The Xpert Xpress SARS-CoV-2/FLU/RSV assay is intended as an aid  in  the diagnosis of influenza from Nasopharyngeal swab specimens and  should not be used as a sole basis for treatment. Nasal washings and  aspirates are unacceptable for Xpert Xpress SARS-CoV-2/FLU/RSV  testing.  Fact Sheet for Patients: PinkCheek.be  Fact Sheet for Healthcare Providers: GravelBags.it  This test is not yet approved or cleared by the [place] FDA and  has been authorized for detection and/or diagnosis of SARS-CoV-2 by  FDA under an Emergency Use Authorization (EUA). This EUA will remain  in effect (meaning this test can be used) for the duration of the  Covid-19 declaration under Section 564(b)(1) of the Act, 21  U.S.C. section 360bbb-3(b)(1), unless the authorization is  terminated or revoked. Performed at [facility], [place] [address], [place], [place] [address]   Lactic acid, plasma     Status: None   Collection Time: 08/06/20  1:05 PM  Result Value Ref Range   Lactic Acid, Venous 0.9 0.5 - 1.9 mmol/L    Comment: Performed at [facility] [address] [place], [place] [address]  Comprehensive metabolic panel     Status: Abnormal   Collection Time: 08/06/20  1:05 PM  Result Value Ref Range   Sodium 128 (L) 135 - 145 mmol/L   Potassium 3.9 3.5 - 5.1 mmol/L   Chloride 89 (L) 98 - 111 mmol/L   CO2 28 22 - 32 mmol/L   Glucose, Bld 89 70 - 99 mg/dL    Comment: Glucose reference range applies only to samples taken after fasting for at least 8 hours.   BUN 7 (L) 8 - 23 mg/dL   Creatinine, Ser 0.47 0.44 - 1.00 mg/dL   Calcium 8.4 (L) 8.9 - 10.3 mg/dL   Total Protein 6.5 6.5 - 8.1 g/dL   Albumin 3.0 (L) 3.5 - 5.0 g/dL   AST 21 15 - 41 U/L   ALT 13 0 - 44 U/L   Alkaline Phosphatase 52 38 - 126 U/L   Total Bilirubin 0.5 0.3 - 1.2 mg/dL    GFR, Estimated >60 >60 mL/min   Anion gap 11 5 - 15    Comment: Performed at [facility], [place] [address] [place], [place] [address]  D-dimer, quantitative     Status: Abnormal   Collection Time: 08/06/20  1:05 PM  Result Value Ref Range   D-Dimer, Quant 5.89 (H) 0.00 - 0.50 ug/mL-FEU    Comment: (NOTE) At the manufacturer cut-off value of 0.5 g/mL FEU, this assay has a negative predictive value of 95-100%.This assay is intended for use in conjunction with a clinical pretest probability (PTP) assessment model to exclude pulmonary embolism (PE) and deep venous thrombosis (DVT) in outpatients suspected of PE or DVT. Results should be correlated with clinical presentation. Performed at [facility], [place] [address], [place], [place] [address]   Procalcitonin     Status: None   Collection Time: 08/06/20  1:05 PM  Result Value Ref Range   Procalcitonin <0.10 ng/mL    Comment:        Interpretation: PCT (Procalcitonin) <= 0.5 ng/mL: Systemic infection (sepsis) is not likely. Local bacterial infection is possible. (NOTE)       Sepsis PCT Algorithm           Lower Respiratory Tract  Infection PCT Algorithm    ----------------------------     ----------------------------         PCT < 0.25 ng/mL                PCT < 0.10 ng/mL          Strongly encourage             Strongly discourage   discontinuation of antibiotics    initiation of antibiotics    ----------------------------     -----------------------------       PCT 0.25 - 0.50 ng/mL            PCT 0.10 - 0.25 ng/mL               OR       >80% decrease in PCT            Discourage initiation of                                            antibiotics      Encourage discontinuation           of antibiotics    ----------------------------     -----------------------------         PCT >= 0.50 ng/mL              PCT 0.26 - 0.50 ng/mL               AND        <80% decrease in PCT              Encourage initiation of                                             antibiotics       Encourage continuation           of antibiotics    ----------------------------     -----------------------------        PCT >= 0.50 ng/mL                  PCT > 0.50 ng/mL               AND         increase in PCT                  Strongly encourage                                      initiation of antibiotics    Strongly encourage escalation           of antibiotics                                     -----------------------------                                           PCT <= 0.25 ng/mL  OR                                        > 80% decrease in PCT                                      Discontinue / Do not initiate                                             antibiotics  Performed at [facility], [place] [address], [place], [place] [address]   Lactate dehydrogenase     Status: Abnormal   Collection Time: 08/06/20  1:05 PM  Result Value Ref Range   LDH 197 (H) 98 - 192 U/L    Comment: Performed at [facility], [place] [address], [place], [place] [address]  Ferritin     Status: None   Collection Time: 08/06/20  1:05 PM  Result Value Ref Range   Ferritin 225 11 - 307 ng/mL    Comment: Performed at [facility], [place] [address], [place], [place] [address]  Fibrinogen     Status: Abnormal   Collection Time: 08/06/20  1:05 PM  Result Value Ref Range   Fibrinogen 493 (H) 210 - 475 mg/dL    Comment: Performed at [facility] [address], [place], [place] [address]  C-reactive protein     Status: Abnormal   Collection Time: 08/06/20  1:05 PM  Result Value Ref Range   CRP 5.5 (H) <1.0 mg/dL    Comment: Performed at [facility], [place] [address] [place], [place] [address]  Triglycerides     Status: None   Collection Time: 08/06/20  1:05 PM  Result Value Ref Range   Triglycerides 101 <150 mg/dL    Comment:  Performed at [facility] [address] [place], [place] [address]  Blood Culture (routine x 2)     Status: None (Preliminary result)   Collection Time: 08/06/20  1:22 PM   Specimen: BLOOD RIGHT WRIST  Result Value Ref Range   Specimen Description BLOOD RIGHT WRIST    Special Requests      BOTTLES DRAWN AEROBIC AND ANAEROBIC Blood Culture results may not be optimal due to an inadequate volume of blood received in culture bottles   Culture      NO GROWTH < 24 HOURS Performed at [facility], [place] [address] [place], [place] [address]    Report Status PENDING   Lactic acid, plasma     Status: None   Collection Time: 08/06/20  2:35 PM  Result Value Ref Range   Lactic Acid, Venous 1.1 0.5 - 1.9 mmol/L    Comment: Performed at [facility], [place] [address], [place], [place] [address]  I-Stat venous blood gas, ED     Status: Abnormal   Collection Time: 08/06/20  2:56 PM  Result Value Ref Range   pH, Ven 7.458 (H) 7.25 - 7.43   pCO2, Ven 46.6 44 - 60 mmHg   pO2, Ven 54.0 (H) 32 - 45 mmHg   Bicarbonate 33.0 (H) 20.0 - 28.0 mmol/L   TCO2  34 (H) 22 - 32 mmol/L   O2 Saturation 89.0 %   Acid-Base Excess 8.0 (H) 0.0 - 2.0 mmol/L   Sodium 128 (L) 135 - 145 mmol/L   Potassium 3.8 3.5 - 5.1 mmol/L   Calcium, Ion 1.03 (L) 1.15 - 1.40 mmol/L   HCT 34.0 (L) 36 - 46 %   Hemoglobin 11.6 (L) 12.0 - 15.0 g/dL   Sample type VENOUS   Brain natriuretic peptide     Status: None   Collection Time: 08/06/20  3:03 PM  Result Value Ref Range   B Natriuretic Peptide 23.3 0.0 - 100.0 pg/mL    Comment: Performed at [facility], [place] [address] [place], [place] [address]  Troponin I (High Sensitivity)     Status: None   Collection Time: 08/06/20  3:03 PM  Result Value Ref Range   Troponin I (High Sensitivity) 13 <18 ng/L    Comment: (NOTE) Elevated high sensitivity troponin I (hsTnI) values and significant  changes across serial measurements may suggest ACS but many other   chronic and acute conditions are known to elevate hsTnI results.  Refer to the "Links" section for chest pain algorithms and additional  guidance. Performed at [facility], [place] [address], [place], [place] [address]   Hepatitis B surface antigen     Status: None   Collection Time: 08/06/20  3:03 PM  Result Value Ref Range   Hepatitis B Surface Ag NON REACTIVE NON REACTIVE    Comment: Performed at [facility] [address], [place], [place] [address]  Protime-INR     Status: None   Collection Time: 08/06/20  3:09 PM  Result Value Ref Range   Prothrombin Time 14.5 11.4 - 15.2 seconds   INR 1.2 0.8 - 1.2    Comment: (NOTE) INR goal varies based on device and disease states. Performed at [facility], [place] [address] [place], [place] [address]   CBC with Differential/Platelet     Status: Abnormal   Collection Time: 08/06/20  3:11 PM  Result Value Ref Range   WBC 3.9 (L) 4.0 - 10.5 K/uL   RBC 4.38 3.87 - 5.11 MIL/uL   Hemoglobin 9.8 (L) 12.0 - 15.0 g/dL   HCT 34.4 (L) 36 - 46 %   MCV 78.5 (L) 80.0 - 100.0 fL   MCH 22.4 (L) 26.0 - 34.0 pg   MCHC 28.5 (L) 30.0 - 36.0 g/dL   RDW Not Measured 11.5 - 15.5 %   Platelets 245 150 - 400 K/uL   nRBC 0.0 0.0 - 0.2 %   Neutrophils Relative % 61 %   Neutro Abs 2.3 1.7 - 7.7 K/uL   Lymphocytes Relative 28 %   Lymphs Abs 1.1 0.7 - 4.0 K/uL   Monocytes Relative 9 %   Monocytes Absolute 0.4 0.1 - 1.0 K/uL   Eosinophils Relative 1 %   Eosinophils Absolute 0.0 0.0 - 0.5 K/uL   Basophils Relative 0 %   Basophils Absolute 0.0 0.0 - 0.1 K/uL   WBC Morphology VACUOLATED NEUTROPHILS    Immature Granulocytes 1 %   Abs Immature Granulocytes 0.02 0.00 - 0.07 K/uL    Comment: Performed at [facility], [address], [place], [place] [address]  Troponin I (High Sensitivity)     Status: None   Collection Time: 08/06/20  5:07 PM  Result Value Ref Range   Troponin I (High Sensitivity) 10 <18 ng/L    Comment:  (  NOTE) Elevated high sensitivity troponin I (hsTnI) values and significant  changes across serial measurements may suggest ACS but many other  chronic and acute conditions are known to elevate hsTnI results.  Refer to the "Links" section for chest pain algorithms and additional  guidance. Performed at [facility], [place] [address] [place], [place] [address]   CBC with Differential/Platelet     Status: Abnormal   Collection Time: 08/07/20  2:48 AM  Result Value Ref Range   WBC 2.2 (L) 4.0 - 10.5 K/uL   RBC 4.47 3.87 - 5.11 MIL/uL   Hemoglobin 10.4 (L) 12.0 - 15.0 g/dL   HCT 34.8 (L) 36 - 46 %   MCV 77.9 (L) 80.0 - 100.0 fL   MCH 23.3 (L) 26.0 - 34.0 pg   MCHC 29.9 (L) 30.0 - 36.0 g/dL   RDW Not Measured 11.5 - 15.5 %   Platelets 262 150 - 400 K/uL   nRBC 0.0 0.0 - 0.2 %   Neutrophils Relative % 66 %   Neutro Abs 1.5 (L) 1.7 - 7.7 K/uL   Lymphocytes Relative 31 %   Lymphs Abs 0.7 0.7 - 4.0 K/uL   Monocytes Relative 2 %   Monocytes Absolute 0.1 0.1 - 1.0 K/uL   Eosinophils Relative 0 %   Eosinophils Absolute 0.0 0.0 - 0.5 K/uL   Basophils Relative 1 %   Basophils Absolute 0.0 0.0 - 0.1 K/uL   Immature Granulocytes 0 %   Abs Immature Granulocytes 0.00 0.00 - 0.07 K/uL    Comment: Performed at [facility], [place] [address], [place], [place] [address]  Comprehensive metabolic panel     Status: Abnormal   Collection Time: 08/07/20  2:48 AM  Result Value Ref Range   Sodium 131 (L) 135 - 145 mmol/L   Potassium 3.9 3.5 - 5.1 mmol/L   Chloride 92 (L) 98 - 111 mmol/L   CO2 30 22 - 32 mmol/L   Glucose, Bld 127 (H) 70 - 99 mg/dL    Comment: Glucose reference range applies only to samples taken after fasting for at least 8 hours.   BUN 6 (L) 8 - 23 mg/dL   Creatinine, Ser 0.50 0.44 - 1.00 mg/dL   Calcium 8.7 (L) 8.9 - 10.3 mg/dL   Total Protein 6.2 (L) 6.5 - 8.1 g/dL   Albumin 2.9 (L) 3.5 - 5.0 g/dL   AST 18 15 - 41 U/L   ALT 15 0 - 44 U/L   Alkaline Phosphatase 46 38 -  126 U/L   Total Bilirubin 0.3 0.3 - 1.2 mg/dL   GFR, Estimated >60 >60 mL/min   Anion gap 9 5 - 15    Comment: Performed at [facility], [place] [address], [place], [place] [address]  C-reactive protein     Status: Abnormal   Collection Time: 08/07/20  2:48 AM  Result Value Ref Range   CRP 6.6 (H) <1.0 mg/dL    Comment: Performed at [facility] [address], [place], [place] [address]  D-dimer, quantitative (not at [facility])     Status: Abnormal   Collection Time: 08/07/20  2:48 AM  Result Value Ref Range   D-Dimer, Quant 5.09 (H) 0.00 - 0.50 ug/mL-FEU    Comment: (NOTE) At the manufacturer cut-off value of 0.5 g/mL FEU, this assay has a negative predictive value of 95-100%.This assay is intended for use in conjunction with a clinical pretest probability (PTP) assessment model to exclude  pulmonary embolism (PE) and deep venous thrombosis (DVT) in outpatients suspected of PE or DVT. Results should be correlated with clinical presentation. Performed at [facility], [place] [address] [place], [place] [address]   Ferritin     Status: None   Collection Time: 08/07/20  2:48 AM  Result Value Ref Range   Ferritin 260 11 - 307 ng/mL    Comment: Performed at [facility], [place] [address], [place], [place] [address]  Magnesium     Status: None   Collection Time: 08/07/20  2:48 AM  Result Value Ref Range   Magnesium 1.9 1.7 - 2.4 mg/dL    Comment: Performed at [facility] [address] [place], [place] [address]  Phosphorus     Status: None   Collection Time: 08/07/20  2:48 AM  Result Value Ref Range   Phosphorus 4.0 2.5 - 4.6 mg/dL    Comment: Performed at [place] [facility] [place] [address] [place], [place] [address]  Protime-INR     Status: None   Collection Time: 08/07/20  2:48 AM  Result Value Ref Range   Prothrombin Time 15.2 11.4 - 15.2 seconds   INR 1.2 0.8 - 1.2    Comment: (NOTE) INR goal varies based on device and disease states. Performed  at [facility], [place] [address], [place], [place] [address]   Brain natriuretic peptide     Status: None   Collection Time: 08/07/20  2:48 AM  Result Value Ref Range   B Natriuretic Peptide 38.8 0.0 - 100.0 pg/mL    Comment: Performed at [facility] [address] [place], [place] [address]  MRSA PCR Screening     Status: None   Collection Time: 08/07/20  6:23 AM   Specimen: Nasal Mucosa; Nasopharyngeal  Result Value Ref Range   MRSA by PCR NEGATIVE NEGATIVE    Comment:        The GeneXpert MRSA Assay (FDA approved for NASAL specimens only), is one component of a comprehensive MRSA colonization surveillance program. It is not intended to diagnose MRSA infection nor to guide or monitor treatment for MRSA infections. Performed at [facility], [place] [address], [place], [place] [address]   Glucose, capillary     Status: Abnormal   Collection Time: 08/07/20  7:50 AM  Result Value Ref Range   Glucose-Capillary 118 (H) 70 - 99 mg/dL    Comment: Glucose reference range applies only to samples taken after fasting for at least 8 hours.  Glucose, capillary     Status: Abnormal   Collection Time: 08/07/20 12:17 PM  Result Value Ref Range   Glucose-Capillary 107 (H) 70 - 99 mg/dL    Comment: Glucose reference range applies only to samples taken after fasting for at least 8 hours.    Current Facility-Administered Medications  Medication Dose Route Frequency Provider Last Rate Last Admin  . 0.9 %  sodium chloride infusion   Intravenous PRN Pahwani, Rinka R, MD      . acetaminophen (TYLENOL) tablet 650 mg  650 mg Oral Q6H PRN Pahwani, Rinka R, MD      . aerochamber plus with mask device 1 each  1 each Other Once Ford, Kelsey N, PA-C      . albuterol (VENTOLIN HFA) 108 (90 Base) MCG/ACT inhaler 2 puff  2 puff Inhalation Q6H Pahwani, Rinka R, MD   2 puff at 08/07/20 0832  . ALPRAZolam Duanne Moron) tablet 1  mg  1 mg Oral Daily PRN Patrecia Pour, NP      . amLODipine  (NORVASC) tablet 10 mg  10 mg Oral Daily Pahwani, Rinka R, MD   10 mg at 08/07/20 0831  . ascorbic acid (VITAMIN C) tablet 500 mg  500 mg Oral Daily Pahwani, Rinka R, MD   500 mg at 08/07/20 0831  . chlorpheniramine-HYDROcodone (TUSSIONEX) 10-8 MG/5ML suspension 5 mL  5 mL Oral Q12H PRN Pahwani, Rinka R, MD      . enoxaparin (LOVENOX) injection 95 mg  1 mg/kg Subcutaneous Q12H Thurnell Lose, MD   95 mg at 08/07/20 0834  . guaiFENesin-dextromethorphan (ROBITUSSIN DM) 100-10 MG/5ML syrup 10 mL  10 mL Oral Q4H PRN Pahwani, Rinka R, MD      . HYDROcodone-acetaminophen (NORCO) 10-325 MG per tablet 0.5 tablet  0.5 tablet Oral QID PRN Pahwani, Rinka R, MD      . insulin aspart (novoLOG) injection 0-15 Units  0-15 Units Subcutaneous TID WC Singh, Prashant K, MD      . insulin aspart (novoLOG) injection 0-5 Units  0-5 Units Subcutaneous QHS Lala Lund K, MD      . insulin glargine (LANTUS) injection 10 Units  10 Units Subcutaneous Daily Thurnell Lose, MD   10 Units at 08/07/20 1010  . levothyroxine (SYNTHROID) tablet 137 mcg  137 mcg Oral QAC breakfast Pahwani, Rinka R, MD   137 mcg at 08/07/20 0618  . losartan (COZAAR) tablet 100 mg  100 mg Oral Daily Pahwani, Rinka R, MD   100 mg at 08/07/20 0831  . methylPREDNISolone sodium succinate (SOLU-MEDROL) 125 mg/2 mL injection 60 mg  60 mg Intravenous Q12H Singh, Prashant K, MD      . ondansetron (ZOFRAN) injection 4 mg  4 mg Intravenous Q6H PRN Pahwani, Rinka R, MD      . pantoprazole (PROTONIX) EC tablet 40 mg  40 mg Oral Daily Thurnell Lose, MD   40 mg at 08/07/20 1107  . remdesivir 100 mg in sodium chloride 0.9 % 100 mL IVPB  100 mg Intravenous Daily Bertis Ruddy, RPH 200 mL/hr at 08/07/20 1010 100 mg at 08/07/20 1010  . sertraline (ZOLOFT) tablet 100 mg  100 mg Oral Daily Pahwani, Rinka R, MD   100 mg at 08/07/20 0831  . umeclidinium bromide (INCRUSE ELLIPTA) 62.5 MCG/INH 1 puff  1 puff Inhalation Daily Pahwani, Rinka R, MD   1 puff at  08/07/20 1107  . warfarin (COUMADIN) tablet 10 mg  10 mg Oral ONCE-1600 Thurnell Lose, MD      . Warfarin - Pharmacist Dosing Inpatient   Does not apply q1600 Bertis Ruddy, [facility]      . zinc sulfate capsule 220 mg  220 mg Oral Daily Pahwani, Rinka R, MD   220 mg at 08/07/20 0831    Musculoskeletal: Strength & Muscle Tone: within normal limits Gait & Station: normal Patient leans: N/A  Psychiatric Specialty Exam: Physical Exam Vitals and nursing note reviewed.  Constitutional:      Appearance: She is well-developed.  HENT:     Head: Normocephalic.  Musculoskeletal:     Cervical back: Normal range of motion.  Neurological:     General: No focal deficit present.     Mental Status: She is alert.  Psychiatric:        Attention and Perception: She perceives visual hallucinations.        Mood and Affect: Mood is anxious.  Speech: Speech normal.        Thought Content: Thought content is paranoid.        Cognition and Memory: Cognition is impaired.     Review of Systems  Psychiatric/Behavioral: Positive for confusion. The patient is nervous/anxious.   All other systems reviewed and are negative.   Blood pressure 117/83, pulse 84, temperature 98.6 F (37 C), temperature source Oral, resp. rate 18, height 5' (1.524 m), weight 95.3 kg, SpO2 96 %.Body mass index is 41.01 kg/m.  General Appearance: Disheveled and Appears to be in respiratory distress while talking at times  Eye Contact:  Good  Speech:  Clear and coherent  Volume:  Normal  Mood:  Anxious and Depressed  Affect:  Congruent and Depressed  Thought Process:  Paranoid, visual hallucinations  Orientation:  Full (Time, Place, and Person)  Thought Content:  Logical  Suicidal Thoughts:  No  Homicidal Thoughts:  No  Memory:  Immediate;   Fair Recent;   Fair  Judgement:  Poor  Insight:  Shallow  Psychomotor Activity:  Psychomotor Retardation  Concentration:  Concentration: Fair and Attention Span: Fair   Recall:  Poor  Fund of Knowledge:  Fair  Language:  Fair  Akathisia:  No  Handed:  Right  AIMS (if indicated):     Assets:  Communication Skills Desire for Improvement Financial Resources/Insurance Housing Social Support  ADL's:  Impaired  Cognition:  Impaired,  Mild  Sleep:       Will recommend continuing sertraline 100 mg for depression, anxiety and to assist with appetite.  If there continues to be concerns surrounding her appetite or oral intake consider adding mirtazapine 7.5 mg.  Decreased Xanax 1 mg TID PRN for anxiety to daily PRN.  Patient with limited support system and instability.   Patient does not have any insight into her previous or current living conditions.  Patient does not have the mental capacity to make medical decisions surrounding discharging home.  It is evident that this patient will need higher level of care even if for short time being in a skilled nursing facility.  She minimizes her depressive symptoms, and current medical conditions.     Mental Capacity Assessment: I have evaluated the following areas to assess the Jaime Kelley mental capacity regarding medical decision-making ability which she does not have at this time, frequently confused.  Treatment Plan Summary: Major depressive disorder, recurrent, moderate: -Continue Zoloft 100 mg daily  Anxiety: -Change Xanax 1 mg TID PRN to daily PRN   Disposition: No evidence of imminent risk to self or others at present.   Patient does not meet criteria for psychiatric inpatient admission.  Jaime Boga, NP 08/07/2020 1:28 PM

## 2020-08-08 DIAGNOSIS — J1282 Pneumonia due to coronavirus disease 2019: Secondary | ICD-10-CM | POA: Diagnosis not present

## 2020-08-08 DIAGNOSIS — U071 COVID-19: Secondary | ICD-10-CM | POA: Diagnosis not present

## 2020-08-08 LAB — CBC WITH DIFFERENTIAL/PLATELET
Abs Immature Granulocytes: 0.01 10*3/uL (ref 0.00–0.07)
Basophils Absolute: 0 10*3/uL (ref 0.0–0.1)
Basophils Relative: 0 %
Eosinophils Absolute: 0 10*3/uL (ref 0.0–0.5)
Eosinophils Relative: 0 %
HCT: 31.8 % — ABNORMAL LOW (ref 36.0–46.0)
Hemoglobin: 9.2 g/dL — ABNORMAL LOW (ref 12.0–15.0)
Immature Granulocytes: 0 %
Lymphocytes Relative: 47 %
Lymphs Abs: 1.1 10*3/uL (ref 0.7–4.0)
MCH: 22.3 pg — ABNORMAL LOW (ref 26.0–34.0)
MCHC: 28.9 g/dL — ABNORMAL LOW (ref 30.0–36.0)
MCV: 77 fL — ABNORMAL LOW (ref 80.0–100.0)
Monocytes Absolute: 0.3 10*3/uL (ref 0.1–1.0)
Monocytes Relative: 14 %
Neutro Abs: 0.9 10*3/uL — ABNORMAL LOW (ref 1.7–7.7)
Neutrophils Relative %: 39 %
Platelets: 251 10*3/uL (ref 150–400)
RBC: 4.13 MIL/uL (ref 3.87–5.11)
WBC: 2.4 10*3/uL — ABNORMAL LOW (ref 4.0–10.5)
nRBC: 0 % (ref 0.0–0.2)

## 2020-08-08 LAB — COMPREHENSIVE METABOLIC PANEL
ALT: 14 U/L (ref 0–44)
AST: 19 U/L (ref 15–41)
Albumin: 2.9 g/dL — ABNORMAL LOW (ref 3.5–5.0)
Alkaline Phosphatase: 42 U/L (ref 38–126)
Anion gap: 8 (ref 5–15)
BUN: 10 mg/dL (ref 8–23)
CO2: 33 mmol/L — ABNORMAL HIGH (ref 22–32)
Calcium: 8.5 mg/dL — ABNORMAL LOW (ref 8.9–10.3)
Chloride: 92 mmol/L — ABNORMAL LOW (ref 98–111)
Creatinine, Ser: 0.52 mg/dL (ref 0.44–1.00)
GFR, Estimated: >60 mL/min (ref >60)
Glucose, Bld: 101 mg/dL — ABNORMAL HIGH (ref 70–99)
Potassium: 4 mmol/L (ref 3.5–5.1)
Sodium: 133 mmol/L — ABNORMAL LOW (ref 135–145)
Total Bilirubin: 0.5 mg/dL (ref 0.3–1.2)
Total Protein: 5.8 g/dL — ABNORMAL LOW (ref 6.5–8.1)

## 2020-08-08 LAB — C-REACTIVE PROTEIN: CRP: 3 mg/dL — ABNORMAL HIGH (ref <1.0)

## 2020-08-08 LAB — VITAMIN B12: Vitamin B-12: 363 pg/mL (ref 180–914)

## 2020-08-08 LAB — TSH: TSH: 1.275 u[IU]/mL (ref 0.350–4.500)

## 2020-08-08 LAB — RPR: RPR Ser Ql: NONREACTIVE

## 2020-08-08 LAB — GLUCOSE, CAPILLARY
Glucose-Capillary: 90 mg/dL (ref 70–99)
Glucose-Capillary: 93 mg/dL (ref 70–99)
Glucose-Capillary: 110 mg/dL — ABNORMAL HIGH (ref 70–99)
Glucose-Capillary: 131 mg/dL — ABNORMAL HIGH (ref 70–99)

## 2020-08-08 LAB — MAGNESIUM: Magnesium: 1.9 mg/dL (ref 1.7–2.4)

## 2020-08-08 LAB — BRAIN NATRIURETIC PEPTIDE: B Natriuretic Peptide: 29.6 pg/mL (ref 0.0–100.0)

## 2020-08-08 LAB — PROTIME-INR
INR: 1.9 — ABNORMAL HIGH (ref 0.8–1.2)
Prothrombin Time: 20.8 seconds — ABNORMAL HIGH (ref 11.4–15.2)

## 2020-08-08 NOTE — Care Management (Signed)
Benefit check sent for Eliquis 

## 2020-08-08 NOTE — Progress Notes (Signed)
PROGRESS NOTE                                                                                                                                                                                                             Patient Demographics:    Jaime Kelley, is a 69 y.o. female, DOB - [birth year], URK:[id number]  Admit date - 08/06/2020   Admitting Physician Mckinley Jewel, MD  Outpatient Primary MD for the patient is Laurey Morale, MD  LOS - 2  Chief Complaint  Patient presents with  . Shortness of Breath    COVID +       Brief Narrative (HPI from H&P) - Jaime Kelley is a 69 y.o. female with medical history significant of hypertension, type 2 diabetes mellitus, morbid obesity, depression/anxiety, chronic hypoxemic respiratory failure secondary to underlying COPD-on 5 L of oxygen at baseline, RA, hypothyroidism, history of PE/DVT-on Coumadin, GERD presents to emergency department for evaluation of hypoxia.  She was recently admitted on 9/18 with complaints of substernal chest pressure and was found to have large left-sided pleural effusion with complete collapse of left lung requiring nonrebreather initially.  Patient underwent left pleural space drainage and chest tube placement which was removed on 9/23.  Bronchoscopy was performed on 9/20 with removal of solitary mucous plug.  Repeat chest x-ray showed recurrent opacification.  Pulmonology performed a repeat thoracentesis at bedside on 07/24/2020.  Chest x-ray on 10/5 demonstrated a small amount of reaccumulation of pleural effusion however oxygen requirements improved to 3 L therefore she discharged to SNF in a stable condition and received [place] Covid vaccine on 10/5.    Subjective:   Patient in bed, appears comfortable, denies any headache, no fever, no chest pain or pressure, no shortness of breath , no abdominal pain. No focal weakness.   Assessment  & Plan :     1.  Acute on chronic hypoxic  respiratory failure in a patient with who has COPD is on 5 L of oxygen at home and now acute PE - she was recently admitted with large left-sided pleural effusion requiring thoracentesis and bronchoscopy for removal of solitary mucous plug.  She also recently received Covid vaccine on 07/27/2020.  She is now here with shortness of breath with minimal left-sided pleural effusion and atelectasis.  Her present acute on chronic hypoxia could be due to mild bronchitis or exacerbation of her COPD, doubt she has true COVID-19 pneumonia, for now continue steroids and Remdesivir, she  is also chronically on Coumadin with subtherapeutic INR and quite elevated D-dimer, CTA was checked and showed acute PE, now on Lovenox eventually will transition to Eliquis.  Continue supplemental oxygen note she is on 5 L of nasal cannula oxygen at home.     Recent Labs  Lab 08/06/20 1305 08/06/20 1435 08/06/20 1456 08/06/20 1503 08/06/20 1509 08/06/20 1511 08/07/20 0248 08/08/20 0403  WBC  --   --   --   --   --  3.9* 2.2* 2.4*  HGB  --   --  11.6*  --   --  9.8* 10.4* 9.2*  HCT  --   --  34.0*  --   --  34.4* 34.8* 31.8*  PLT  --   --   --   --   --  245 262 251  CRP 5.5*  --   --   --   --   --  6.6* 3.0*  BNP  --   --   --  23.3  --   --  38.8 29.6  DDIMER 5.89*  --   --   --   --   --  5.09*  --   PROCALCITON <0.10  --   --   --   --   --   --   --   AST 21  --   --   --   --   --  18 19  ALT 13  --   --   --   --   --  15 14  ALKPHOS 52  --   --   --   --   --  46 42  BILITOT 0.5  --   --   --   --   --  0.3 0.5  ALBUMIN 3.0*  --   --   --   --   --  2.9* 2.9*  INR  --   --   --   --  1.2  --  1.2 1.9*  LATICACIDVEN 0.9 1.1  --   --   --   --   --   --   SARSCOV2NAA POSITIVE*  --   --   --   --   --   --   --      2.  Underlying COPD with 5 L nasal cannula oxygen at home.  Supportive care.  Continue IV steroids.  3.  Patient expresses her wishes to leave AMA, after counseling she has agreed to stay,  last admission she was deemed not to have capacity to make her medical decisions, ever in my evaluation she was alert awake oriented x3 and did not have any healthy submissions or delusions, had good insight.  Had discussions with bedside nurses who said patient most of the time is alert awake but intermittently at times does get confused and talks out of her head, psychiatry evaluation was also requested on 08/07/2020 and during their evaluation she was delusional at times, at this time psychiatry has deemed her again not to have capacity to make her own medical decisions.  Patient seems to have intermittent bouts of delusions.  So her home living situation is not appropriate.  We will check TSH, RPR and B12.   4.  Acute PE with history of DVT.  Was on Coumadin with subtherapeutic INR, has a new PE, placed on Lovenox, switch to Eliquis on 08/09/2020.  5.  Anxiety and depression on Zoloft and Xanax.  Continue.  6.  Hypothyroidism.  On Synthroid.  7.  Central hypertension.  On Norvasc and ARB.  9.  Morbid obesity.  BMI 41.  Follow with PCP.   10.  DM type II.  Low-dose Lantus and sliding scale and monitor.  Lab Results  Component Value Date   HGBA1C 6.0 08/14/2017   CBG (last 3)  Recent Labs    08/07/20 1546 08/07/20 2020 08/08/20 0758  GLUCAP 117* 146* 90      Condition - Extremely Guarded  Family Communication  :    Daughter [phone] - 11/08/19  Son Simon Rhein - [phone] -11/08/2019, extremely argumentative and rude and slammed the phone down.  Would not let me finish my conversation.  Patient states that son has no right to interfere with her medical decision making.  Code Status :  Full  Consults  : Psych  Procedures  :    PUD Prophylaxis : PPI  Disposition Plan  :    Status is: Inpatient  Remains inpatient appropriate because:IV treatments appropriate due to intensity of illness or inability to take PO   Dispo: The patient is from: Home               Anticipated d/c is to: Home              Anticipated d/c date is: > 3 days              Patient currently is not medically stable to d/c.   DVT Prophylaxis  :  Lovenox >> Coumadin  Lab Results  Component Value Date   INR 1.9 (H) 08/08/2020   INR 1.2 08/07/2020   INR 1.2 08/06/2020     Lab Results  Component Value Date   PLT 251 08/08/2020    Diet :  Diet Order            Diet heart healthy/carb modified Room service appropriate? Yes; Fluid consistency: Thin  Diet effective now                  Inpatient Medications Scheduled Meds: . aerochamber plus with mask  1 each Other Once  . albuterol  2 puff Inhalation Q6H  . amLODipine  10 mg Oral Daily  . vitamin C  500 mg Oral Daily  . enoxaparin (LOVENOX) injection  1 mg/kg Subcutaneous Q12H  . insulin aspart  0-15 Units Subcutaneous TID WC  . insulin aspart  0-5 Units Subcutaneous QHS  . insulin glargine  10 Units Subcutaneous Daily  . levothyroxine  137 mcg Oral QAC breakfast  . losartan  100 mg Oral Daily  . methylPREDNISolone (SOLU-MEDROL) injection  60 mg Intravenous Q12H  . pantoprazole  40 mg Oral Daily  . sertraline  100 mg Oral Daily  . umeclidinium bromide  1 puff Inhalation Daily  . Warfarin - Pharmacist Dosing Inpatient   Does not apply q1600  . zinc sulfate  220 mg Oral Daily   Continuous Infusions: . sodium chloride    . remdesivir 100 mg in NS 100 mL 100 mg (08/07/20 1010)   PRN Meds:.sodium chloride, acetaminophen, ALPRAZolam, chlorpheniramine-HYDROcodone, guaiFENesin-dextromethorphan, HYDROcodone-acetaminophen, [DISCONTINUED] ondansetron **OR** ondansetron (ZOFRAN) IV  Antibiotics  :   Anti-infectives (From admission, onward)   Start     Dose/Rate Route Frequency Ordered Stop   08/07/20 1000  remdesivir 100 mg in sodium chloride 0.9 % 100 mL IVPB       "Followed by" Linked Group Details   100 mg 200 mL/hr  over 30 Minutes Intravenous Daily 08/06/20 1449 08/11/20 0959   08/07/20 1000  remdesivir  100 mg in sodium chloride 0.9 % 100 mL IVPB  Status:  Discontinued       "Followed by" Linked Group Details   100 mg 200 mL/hr over 30 Minutes Intravenous Daily 08/06/20 1503 08/06/20 1506   08/06/20 1700  remdesivir 200 mg in sodium chloride 0.9% 250 mL IVPB       "Followed by" Linked Group Details   200 mg 580 mL/hr over 30 Minutes Intravenous Once 08/06/20 1449 08/07/20 0345   08/06/20 1515  remdesivir 200 mg in sodium chloride 0.9% 250 mL IVPB  Status:  Discontinued       "Followed by" Linked Group Details   200 mg 580 mL/hr over 30 Minutes Intravenous Once 08/06/20 1503 08/06/20 1506          Objective:   Vitals:   08/07/20 2000 08/07/20 2319 08/08/20 0438 08/08/20 0753  BP: 132/80 129/70 (!) 156/72 (!) 150/79  Pulse: 85 76 74 77  Resp: (!) 22 17 17 16   Temp:  98.8 F (37.1 C) 98.7 F (37.1 C) 98.2 F (36.8 C)  TempSrc:  Axillary Axillary Oral  SpO2: 95% 97% 96% 96%  Weight:      Height:        SpO2: 96 % O2 Flow Rate (L/min): 3 L/min  Wt Readings from Last 3 Encounters:  08/06/20 95.3 kg  07/27/20 81.6 kg  10/24/18 103.1 kg     Intake/Output Summary (Last 24 hours) at 08/08/2020 0904 Last data filed at 08/08/2020 0500 Gross per 24 hour  Intake 150 ml  Output 700 ml  Net -550 ml     Physical Exam  Awake Alert, oriented x 3, No new F.N deficits, no delusions or hallucinations Witt.AT,PERRAL Supple Neck,No JVD, No cervical lymphadenopathy appriciated.  Symmetrical Chest wall movement, Good air movement bilaterally, CTAB RRR,No Gallops, Rubs or new Murmurs, No Parasternal Heave +ve B.Sounds, Abd Soft, No tenderness, No organomegaly appriciated, No rebound - guarding or rigidity. No Cyanosis, Clubbing or edema, No new Rash or bruise     Pressure Injury 07/10/20 Buttocks Left Stage 2 -  Partial thickness loss of dermis presenting as a shallow open injury with a red, pink wound bed without slough. (Active)  07/10/20 2330  Location: Buttocks  Location  Orientation: Left  Staging: Stage 2 -  Partial thickness loss of dermis presenting as a shallow open injury with a red, pink wound bed without slough.  Wound Description (Comments):   Present on Admission: Yes     Pressure Injury 07/11/20 Buttocks Left Stage 2 -  Partial thickness loss of dermis presenting as a shallow open injury with a red, pink wound bed without slough. (Active)  07/11/20 2330  Location: Buttocks  Location Orientation: Left  Staging: Stage 2 -  Partial thickness loss of dermis presenting as a shallow open injury with a red, pink wound bed without slough.  Wound Description (Comments):   Present on Admission: Yes     Data Review:   Recent Labs  Lab 08/06/20 1456 08/06/20 1511 08/07/20 0248 08/08/20 0403  WBC  --  3.9* 2.2* 2.4*  HGB 11.6* 9.8* 10.4* 9.2*  HCT 34.0* 34.4* 34.8* 31.8*  PLT  --  245 262 251  MCV  --  78.5* 77.9* 77.0*  MCH  --  22.4* 23.3* 22.3*  MCHC  --  28.5* 29.9* 28.9*  RDW  --  Not Measured Not Measured  Not Measured  LYMPHSABS  --  1.1 0.7 1.1  MONOABS  --  0.4 0.1 0.3  EOSABS  --  0.0 0.0 0.0  BASOSABS  --  0.0 0.0 0.0    Recent Labs  Lab 08/06/20 1305 08/06/20 1435 08/06/20 1456 08/06/20 1503 08/06/20 1509 08/07/20 0248 08/08/20 0403  NA 128*  --  128*  --   --  131* 133*  K 3.9  --  3.8  --   --  3.9 4.0  CL 89*  --   --   --   --  92* 92*  CO2 28  --   --   --   --  30 33*  GLUCOSE 89  --   --   --   --  127* 101*  BUN 7*  --   --   --   --  6* 10  CREATININE 0.47  --   --   --   --  0.50 0.52  CALCIUM 8.4*  --   --   --   --  8.7* 8.5*  AST 21  --   --   --   --  18 19  ALT 13  --   --   --   --  15 14  ALKPHOS 52  --   --   --   --  46 42  BILITOT 0.5  --   --   --   --  0.3 0.5  ALBUMIN 3.0*  --   --   --   --  2.9* 2.9*  MG  --   --   --   --   --  1.9 1.9  CRP 5.5*  --   --   --   --  6.6* 3.0*  DDIMER 5.89*  --   --   --   --  5.09*  --   PROCALCITON <0.10  --   --   --   --   --   --   LATICACIDVEN 0.9 1.1   --   --   --   --   --   INR  --   --   --   --  1.2 1.2 1.9*  BNP  --   --   --  23.3  --  38.8 29.6    Recent Labs  Lab 08/06/20 1305 08/06/20 1503 08/07/20 0248 08/08/20 0403  CRP 5.5*  --  6.6* 3.0*  DDIMER 5.89*  --  5.09*  --   BNP  --  23.3 38.8 29.6  PROCALCITON <0.10  --   --   --   SARSCOV2NAA POSITIVE*  --   --   --     ------------------------------------------------------------------------------------------------------------------ Recent Labs    08/06/20 1305  TRIG 101    Lab Results  Component Value Date   HGBA1C 6.0 08/14/2017   ------------------------------------------------------------------------------------------------------------------ No results for input(s): TSH, T4TOTAL, T3FREE, THYROIDAB in the last 72 hours.  Invalid input(s): FREET3 ------------------------------------------------------------------------------------------------------------------ Recent Labs    08/06/20 1305 08/07/20 0248  FERRITIN 225 260    Coagulation profile Recent Labs  Lab 08/06/20 1509 08/07/20 0248 08/08/20 0403  INR 1.2 1.2 1.9*    Recent Labs    08/06/20 1305 08/07/20 0248  DDIMER 5.89* 5.09*    Cardiac Enzymes No results for input(s): CKMB, TROPONINI, MYOGLOBIN in the last 168 hours.  Invalid input(s): CK ------------------------------------------------------------------------------------------------------------------    Component Value Date/Time   BNP 29.6 08/08/2020 0403  Micro Results Recent Results (from the past 240 hour(s))  Blood Culture (routine x 2)     Status: None (Preliminary result)   Collection Time: 08/06/20  1:00 PM   Specimen: BLOOD  Result Value Ref Range Status   Specimen Description BLOOD RIGHT ANTECUBITAL  Final   Special Requests   Final    BOTTLES DRAWN AEROBIC AND ANAEROBIC Blood Culture results may not be optimal due to an inadequate volume of blood received in culture bottles   Culture   Final    NO GROWTH 2  DAYS Performed at [facility] [address] [place], [place] [address]    Report Status PENDING  Incomplete  Respiratory Panel by RT PCR (Flu A&B, Covid) - Nasopharyngeal Swab     Status: Abnormal   Collection Time: 08/06/20  1:05 PM   Specimen: Nasopharyngeal Swab  Result Value Ref Range Status   SARS Coronavirus 2 by RT PCR POSITIVE (A) NEGATIVE Final    Comment: emailed L. Berdik RN 14:40 08/06/20 ([email or web address]) (NOTE) SARS-CoV-2 target nucleic acids are DETECTED.  SARS-CoV-2 RNA is generally detectable in upper respiratory specimens  during the acute phase of infection. Positive results are indicative of the presence of the identified virus, but do not rule out bacterial infection or co-infection with other pathogens not detected by the test. Clinical correlation with patient history and other diagnostic information is necessary to determine patient infection status. The expected result is Negative.  Fact Sheet for Patients:  PinkCheek.be  Fact Sheet for Healthcare Providers: GravelBags.it  This test is not yet approved or cleared by the [place] FDA and  has been authorized for detection and/or diagnosis of SARS-CoV-2 by FDA under an Emergency Use Authorization (EUA).  This EUA will remain in effect (meaning this test can be used) for the duration of  the COVID-19  declaration under Section 564(b)(1) of the Act, 21 U.S.C. section 360bbb-3(b)(1), unless the authorization is terminated or revoked sooner.      Influenza A by PCR NEGATIVE NEGATIVE Final   Influenza B by PCR NEGATIVE NEGATIVE Final    Comment: (NOTE) The Xpert Xpress SARS-CoV-2/FLU/RSV assay is intended as an aid in  the diagnosis of influenza from Nasopharyngeal swab specimens and  should not be used as a sole basis for treatment. Nasal washings and  aspirates are unacceptable for Xpert Xpress SARS-CoV-2/FLU/RSV  testing.  Fact Sheet for  Patients: PinkCheek.be  Fact Sheet for Healthcare Providers: GravelBags.it  This test is not yet approved or cleared by the [place] FDA and  has been authorized for detection and/or diagnosis of SARS-CoV-2 by  FDA under an Emergency Use Authorization (EUA). This EUA will remain  in effect (meaning this test can be used) for the duration of the  Covid-19 declaration under Section 564(b)(1) of the Act, 21  U.S.C. section 360bbb-3(b)(1), unless the authorization is  terminated or revoked. Performed at [facility], [place] [address], [place], [place] [address]   Blood Culture (routine x 2)     Status: None (Preliminary result)   Collection Time: 08/06/20  1:22 PM   Specimen: BLOOD RIGHT WRIST  Result Value Ref Range Status   Specimen Description BLOOD RIGHT WRIST  Final   Special Requests   Final    BOTTLES DRAWN AEROBIC AND ANAEROBIC Blood Culture results may not be optimal due to an inadequate volume of blood received in culture bottles   Culture   Final    NO GROWTH 2 DAYS  Performed at [facility], [place] [address] [place], [place] [address]    Report Status PENDING  Incomplete  MRSA PCR Screening     Status: None   Collection Time: 08/07/20  6:23 AM   Specimen: Nasal Mucosa; Nasopharyngeal  Result Value Ref Range Status   MRSA by PCR NEGATIVE NEGATIVE Final    Comment:        The GeneXpert MRSA Assay (FDA approved for NASAL specimens only), is one component of a comprehensive MRSA colonization surveillance program. It is not intended to diagnose MRSA infection nor to guide or monitor treatment for MRSA infections. Performed at [facility], [place] [address] [place], [place] [address]     Radiology Reports DG Chest 2 View  Result Date: 07/26/2020 CLINICAL DATA:  Left pleural effusion EXAM: CHEST - 2 VIEW COMPARISON:  07/24/2020 FINDINGS: Small left pleural effusion is increased. Left basilar  atelectasis is present. No pneumothorax. Background changes of emphysema. Stable cardiomediastinal contours. IMPRESSION: Small left pleural effusion increased since 07/24/2020. Mild left basilar atelectasis. Electronically Signed   By: Macy Mis M.D.   On: 07/26/2020 08:07   CT Head Wo Contrast  Result Date: 07/10/2020 CLINICAL DATA:  Delirium EXAM: CT HEAD WITHOUT CONTRAST TECHNIQUE: Contiguous axial images were obtained from the base of the skull through the vertex without intravenous contrast. COMPARISON:  None. FINDINGS: Brain: Normal anatomic configuration. Parenchymal volume loss is advanced in relation to the patient's age. Moderate periventricular white matter changes are present likely reflecting the sequela of small vessel ischemia. Remote left pontine infarct. No abnormal intra or extra-axial mass lesion or fluid collection. No abnormal mass effect or midline shift. No evidence of acute intracranial hemorrhage or infarct. Borderline ventriculomegaly is commensurate with the degree of cerebral atrophy and likely reflects central atrophy. Cerebellum unremarkable. Vascular: No asymmetric hyperdense vasculature at the skull base. Skull: Intact Sinuses/Orbits: Moderate mucosal thickening and air-fluid level noted within the a sphenoid sinus. Osteoma noted within the left frontal sinus. Remaining paranasal sinuses are clear. Orbits are unremarkable. Other: There is near complete fluid opacification of the mastoid air cells and middle ear cavities bilaterally. No definite superimposed osseous erosion. IMPRESSION: No evidence of acute intracranial hemorrhage or infarct. Relatively advanced senescent changes.  Remote left pontine infarct. Extensive fluid opacification of the mastoid air cells and middle ear cavities. Correlation for otomastoiditis is recommended. Sphenoid sinusitis. Electronically Signed   By: Fidela Salisbury MD   On: 07/10/2020 19:35   CT CHEST WO CONTRAST  Result Date:  07/23/2020 CLINICAL DATA:  Abnormal radiograph, pleural effusion EXAM: CT CHEST WITHOUT CONTRAST TECHNIQUE: Multidetector CT imaging of the chest was performed following the standard protocol without IV contrast. COMPARISON:  Radiograph 07/21/2020, CT 07/10/2020 FINDINGS: Cardiovascular: Cardiac size within normal limits. Small volume pericardial fluid is similar to prior. Dense calcifications along the mitral annulus. Additional calcifications of the aortic leaflets. Hypoattenuation of the cardiac blood pool compatible with anemia. Extensive three-vessel native coronary artery atherosclerosis. Atherosclerotic plaque within the normal caliber aorta. No hyperdense mural thickening or plaque displacement nor periaortic stranding or hemorrhage. Shared origin of the brachiocephalic and left common carotid arteries. Additional calcifications seen throughout the proximal great vessels. Central pulmonary arteries are normal caliber. Luminal evaluation precluded in the absence of contrast media. No visible worrisome venous abnormalities. Mediastinum/Nodes: Persistent slight leftward mediastinal shift, similar to comparison. No mediastinal fluid, gas or hemorrhage. Redemonstration of multiple asymmetrically prominent though non pathologically enlarged left axillary and mediastinal lymph nodes. No  right axillary adenopathy. Hilar nodal is severely limited in the absence of contrast media. No acute abnormality of the trachea or esophagus. Thyroid gland is diminutive, correlate for prior thyroidectomy or ablation. Lungs/Pleura: Background of moderate centrilobular emphysematous changes throughout both lungs. There is a moderate residual left pleural effusion with adjacent areas of passive atelectasis in the left lung base. Some additional thickening is noted along the left major fissure which may reflect some persistent loculated fluid. Adjacent atelectatic changes are present along the left major fissure as well. Predominantly  involving the lingula and posterior segment left upper lobe. Underlying consolidation would be difficult to exclude. Some there is a stable sub solid opacity in the posterosuperior segment right lower lobe (4/74 measuring up to 8 mm. Possibly post infectious or inflammatory. Stable scarring within the anterior basal segment right lower lobe and major fissure. Right lung is otherwise clear. Upper Abdomen: Multiple calcified gallstones within the largely decompressed gallbladder. Scattered colonic diverticula without focal inflammation to suggest diverticulitis. Extensive upper abdominal atherosclerotic calcification. Stable likely parapelvic cyst in the upper pole left kidney. Musculoskeletal: Mild body wall edema, left slightly more pronounced than right. No acute abnormality or worrisome osseous lesions. Levocurvature of the upper thoracic spine with some mild dextrocurvature of the mid to lower levels. Multilevel discogenic and facet degenerative changes are present. Additional degenerative changes of the right shoulder and sternoclavicular joints similar to prior. IMPRESSION: 1. Moderate residual left pleural effusion with adjacent areas of passive atelectasis in the left lung base. Some additional thickening along the left major fissure may reflect some persistent loculated fluid with some further atelectatic change in the lingula as well. Underlying consolidation within the atelectatic lung would cannot be fully excluded. 2. Stable appearance of an 8 mm ground-glass opacity in the posterosuperior segment right lower lobe. Possibly post infectious or inflammatory. Recommend attention on follow-up imaging. Initial follow-up with CT at 6-12 months is recommended to confirm persistence. If persistent, repeat CT is recommended every 2 years until 5 years of stability has been established. This recommendation follows the consensus statement: Guidelines for Management of Incidental Pulmonary Nodules Detected on CT  Images: From the Fleischner Society 2017; Radiology 2017; 284:228-243. 3. Redemonstration of multiple asymmetrically prominent though non pathologically enlarged left axillary and mediastinal lymph nodes, nonspecific though possibly reactive or edematous. 4. Cholelithiasis without evidence of acute cholecystitis. 5. Colonic diverticulosis without evidence of diverticulitis. 6. Aortic Atherosclerosis (ICD10-I70.0) 7. Three-vessel coronary artery calcifications. 8. Emphysema (ICD10-J43.9). Electronically Signed   By: Lovena Le M.D.   On: 07/23/2020 22:26   CT Chest W Contrast  Result Date: 07/10/2020 CLINICAL DATA:  Pleural effusion EXAM: CT CHEST WITH CONTRAST TECHNIQUE: Multidetector CT imaging of the chest was performed during intravenous contrast administration. CONTRAST:  23mL OMNIPAQUE IOHEXOL 300 MG/ML  SOLN COMPARISON:  None. FINDINGS: Cardiovascular: There is extensive coronary artery calcification. Global cardiac size within normal limits. Left ventricular hypertrophy noted. Moderate calcification of the mitral valve annulus. No pericardial effusion. The central pulmonary arteries are of normal caliber. The thoracic aorta is of normal caliber. There is extensive atherosclerotic calcifications seen within the arch vasculature proximally, particularly the left subclavian artery, however, the degree of stenosis is not well assessed on this non arteriographic study. Scattered mild atherosclerotic calcifications seen within the descending thoracic aorta., Mediastinum/Nodes: No pathologic adenopathy. Thyroid unremarkable. Esophagus unremarkable. Lungs/Pleura: A large left pleural effusion is present with complete opacification of the left hemithorax and complete collapse of the left lung. There is debris seen within  the left mainstem bronchus. Moderate emphysema involving the aerated right lung. No superimposed focal pulmonary infiltrate or nodule. No pneumothorax or pleural effusion on the right. Upper  Abdomen: No acute abnormality. Musculoskeletal: Osseous structures are age-appropriate. No acute bone abnormality. IMPRESSION: Large left pleural effusion with complete collapse of the left lung. Moderate debris within the left mainstem bronchus. Extensive coronary artery calcification. Peripheral vascular disease with extensive calcification of the origin of the left subclavian artery. If there is suggestion of hemodynamically significant stenosis (subclavian steal, left upper extremity claudication), this would be better assessed with CT arteriography. Aortic Atherosclerosis (ICD10-I70.0) and Emphysema (ICD10-J43.9). Electronically Signed   By: Fidela Salisbury MD   On: 07/10/2020 19:45   CT ANGIO CHEST PE W OR WO CONTRAST  Result Date: 08/07/2020 CLINICAL DATA:  PE suspected, established right-sided PE, increased shortness of breath, assess embolus burden EXAM: CT ANGIOGRAPHY CHEST WITH CONTRAST TECHNIQUE: Multidetector CT imaging of the chest was performed using the standard protocol during bolus administration of intravenous contrast. Multiplanar CT image reconstructions and MIPs were obtained to evaluate the vascular anatomy. CONTRAST:  74mL OMNIPAQUE IOHEXOL 350 MG/ML SOLN COMPARISON:  None. FINDINGS: Cardiovascular: Examination for pulmonary embolism is somewhat limited by breath motion artifact. Within this limitation, there are occasional probable subsegmental emboli, for example in the dependent right lower lobe (series 6, image 76) and in the dependent left lower lobe (series 6, image 63). There is no segmental or more central emboli identified. Aortic atherosclerosis. Normal heart size. Three-vessel coronary artery calcifications. Enlargement of the main pulmonary artery measuring up to 3.6 cm in caliber. Small pericardial effusion. Mediastinum/Nodes: No enlarged mediastinal, hilar, or axillary lymph nodes. Thyroid gland, trachea, and esophagus demonstrate no significant findings. Lungs/Pleura:  Moderate centrilobular emphysema. Moderate left pleural effusion and associated atelectasis or consolidation, similar to prior examination. Upper Abdomen: No acute abnormality. Musculoskeletal: No chest wall abnormality. No acute or significant osseous findings. Review of the MIP images confirms the above findings. IMPRESSION: 1. Examination for pulmonary embolism is somewhat limited by breath motion artifact. Within this limitation, there are occasional probable subsegmental emboli present bilaterally. No segmental or more central embolus identified. 2. Moderate left pleural effusion and associated atelectasis or consolidation, similar to prior examination. 3. Emphysema (ICD10-J43.9). 4. Coronary artery disease. Aortic Atherosclerosis (ICD10-I70.0) 5. Enlargement of the main pulmonary artery measuring up to 3.6 cm in caliber, as can be seen in pulmonary hypertension. Electronically Signed   By: Eddie Candle M.D.   On: 08/07/2020 15:33   DG Chest Port 1 View  Result Date: 08/06/2020 CLINICAL DATA:  COVID-19 positive with shortness of breath EXAM: PORTABLE CHEST 1 VIEW COMPARISON:  July 26, 2020 FINDINGS: There is left lower lobe airspace opacity with small left pleural effusion. The lungs elsewhere are clear. The heart is upper normal in size with pulmonary vascularity normal. No adenopathy. There is aortic atherosclerosis. No bone lesions. IMPRESSION: Small left pleural effusion. Airspace opacity in the left lower lobe concerning for pneumonia with likely associated atelectasis. Lungs elsewhere clear. Stable cardiac silhouette. Aortic Atherosclerosis (ICD10-I70.0). Electronically Signed   By: Lowella Grip III M.D.   On: 08/06/2020 13:31   DG CHEST PORT 1 VIEW  Result Date: 07/24/2020 CLINICAL DATA:  Status post left thoracentesis EXAM: PORTABLE CHEST 1 VIEW COMPARISON:  07/21/2020 FINDINGS: Trace left pleural effusion, improved status post thoracentesis. No pneumothorax is seen. Right lung is  essentially clear. The heart is normal in size.  Thoracic aortic atherosclerosis. IMPRESSION: Trace left pleural effusion, improved  status post thoracentesis. No pneumothorax is seen. Electronically Signed   By: Julian Hy M.D.   On: 07/24/2020 13:01   DG Chest Port 1 View  Result Date: 07/21/2020 CLINICAL DATA:  Shortness of breath EXAM: PORTABLE CHEST 1 VIEW COMPARISON:  July 15, 2020 FINDINGS: There is consolidation throughout the left mid and lower lung regions with questionable superimposed left pleural effusion. The right lung is clear. Heart is upper normal in size with pulmonary vascularity normal. No adenopathy. There is aortic atherosclerosis. No bone lesions. IMPRESSION: Consolidation throughout much of the left mid and lower lung regions with suspected small pleural effusions superimposed on the left. Right lung clear. Heart upper normal in size. No adenopathy. Aortic Atherosclerosis (ICD10-I70.0). Electronically Signed   By: Lowella Grip III M.D.   On: 07/21/2020 14:53   DG Chest Port 1 View  Result Date: 07/15/2020 CLINICAL DATA:  Left chest tube EXAM: PORTABLE CHEST 1 VIEW COMPARISON:  07/14/2020 FINDINGS: Slight retraction of the left small bore chest tube. No significant pneumothorax by plain radiography. Persistent left basilar collapse/consolidation. No enlarging effusion. Stable right lung aeration. Heart remains enlarged. Aorta atherosclerotic.  Degenerative changes noted of the spine. IMPRESSION: Slight retraction of the left chest tube. No current significant pneumothorax by plain radiography Persistent left lower lung collapse/consolidation. Aortic Atherosclerosis (ICD10-I70.0). Electronically Signed   By: Jerilynn Mages.  Shick M.D.   On: 07/15/2020 08:00   DG Chest Port 1 View  Result Date: 07/14/2020 CLINICAL DATA:  Chest tube present EXAM: PORTABLE CHEST 1 VIEW COMPARISON:  July 14, 2020 study obtained earlier in the day. FINDINGS: Chest tube on the left is unchanged  in position. No evident pneumothorax. There is bibasilar atelectatic change, somewhat more on the left than on the right, stable. No new opacity evident. Heart is upper normal in size with pulmonary vascularity normal. No adenopathy. There is aortic atherosclerosis. No bone lesions. There is bilateral carotid artery calcification. IMPRESSION: Chest tube unchanged on the left without evident pneumothorax. Lower lung region atelectasis is stable. No new opacity. Stable cardiac silhouette. Aortic Atherosclerosis (ICD10-I70.0). There is bilateral carotid artery calcification. Electronically Signed   By: Lowella Grip III M.D.   On: 07/14/2020 13:25   DG Chest Port 1 View  Result Date: 07/14/2020 CLINICAL DATA:  Chest tube surveillance EXAM: PORTABLE CHEST 1 VIEW COMPARISON:  07/13/2020 FINDINGS: Left-sided chest tube remains in place. Stable cardiomegaly. Aortic atherosclerosis. Suspect small residual left-sided pleural effusion. Persistent bibasilar atelectasis. No pneumothorax is seen. IMPRESSION: 1. Left-sided chest tube remains in place. No pneumothorax. 2. Persistent bibasilar atelectasis and probable small residual left pleural effusion. Electronically Signed   By: Davina Poke D.O.   On: 07/14/2020 08:18   DG Chest Port 1 View  Result Date: 07/13/2020 CLINICAL DATA:  69 year old female with pleural effusion, chest tube placed. EXAM: PORTABLE CHEST 1 VIEW COMPARISON:  Portable chest 07/12/2020 and earlier. FINDINGS: Portable AP semi upright view at 1222 hours. Left chest tube position has not significantly changed. No pneumothorax. No residual pleural effusion is evident now. Mild patchy residual opacity at the left lung base. Mediastinal contours are within normal limits. Allowing for portable technique the right lung appears clear. Negative trachea. No acute osseous abnormality identified. IMPRESSION: 1. Stable left chest tube. Residual atelectasis but no pneumothorax or residual pleural  effusion. 2. No new cardiopulmonary abnormality. Electronically Signed   By: Genevie Ann M.D.   On: 07/13/2020 12:41   DG CHEST PORT 1 VIEW  Result Date: 07/12/2020 CLINICAL DATA:  Left pleural effusion EXAM: PORTABLE CHEST 1 VIEW COMPARISON:  07/11/2020 FINDINGS: Decreasing left pleural effusion with left chest tube in place. Moderate to large left effusion remains with improved aeration in the left lung. Continued left lower lobe atelectasis or infiltrate. No confluent opacity on the right. No pneumothorax. Aortic atherosclerosis. IMPRESSION: Decreasing left effusion. The left effusion remains moderate to large with left lower lobe atelectasis or infiltrate. Left chest tube in place.  No visible pneumothorax. Electronically Signed   By: Rolm Baptise M.D.   On: 07/12/2020 15:56   DG CHEST PORT 1 VIEW  Result Date: 07/11/2020 CLINICAL DATA:  Status post thoracocentesis EXAM: PORTABLE CHEST 1 VIEW COMPARISON:  July 10, 2020 FINDINGS: The cardiomediastinal silhouette is completely obscured.Persistent complete opacification of the LEFT hemithorax. Large LEFT pleural effusion. No significant pneumothorax. RIGHT lung is clear. Visualized abdomen is unremarkable. Multilevel degenerative changes of the thoracic spine. IMPRESSION: Persistent complete opacification of the LEFT hemithorax. No significant pneumothorax. Electronically Signed   By: Valentino Saxon MD   On: 07/11/2020 10:30   DG Chest Portable 1 View  Result Date: 07/10/2020 CLINICAL DATA:  Chest pain, shortness of breath, COPD EXAM: PORTABLE CHEST 1 VIEW COMPARISON:  01/18/2017 FINDINGS: There is total opacification of the left hemithorax, which is incompletely imaged due to angulation of radiograph and exclusion inferiorly. There is significant volume loss with leftward shift of the trachea and mediastinal contents. The right lung is normally aerated. IMPRESSION: There is total opacification of the left hemithorax, which is incompletely imaged  due to angulation of radiograph and exclusion inferiorly. There is significant volume loss with leftward shift of the trachea and mediastinal contents. Findings are consistent with a large pleural effusion and total atelectasis or consolidation of the left lung, and generally concerning for malignancy. Recommend CT to further evaluate. Electronically Signed   By: Eddie Candle M.D.   On: 07/10/2020 18:25    Time Spent in minutes  30   Lala Lund M.D on 08/08/2020 at 9:04 AM  To page go to [email or web address] - password [facility]

## 2020-08-08 NOTE — Progress Notes (Signed)
Pt AOx 3-4. Intermittently confused at times with some memory impairment. Pt found to occasionally take off nasal cannula and forget to place back on. Pt educated to not take off [place]. Pt verbalized understanding but needs reinforcement.

## 2020-08-08 NOTE — Progress Notes (Signed)
ANTICOAGULATION CONSULT NOTE - Initial Consult  Pharmacy Consult for Lovenox bridge/warfarin >> apixaban Indication: Hx of VTE  Allergies  Allergen Reactions  . Effexor [Venlafaxine Hydrochloride] Rash    Patient Measurements: Height: 5' (152.4 cm) Weight: 95.3 kg (210 lb) IBW/kg (Calculated) : 45.5  Vital Signs: Temp: 98.2 F (36.8 C) (10/17 0753) Temp Source: Oral (10/17 0753) BP: 150/79 (10/17 0753) Pulse Rate: 77 (10/17 0753)  Labs: Recent Labs    08/06/20 1305 08/06/20 1456 08/06/20 1503 08/06/20 1509 08/06/20 1511 08/06/20 1511 08/06/20 1707 08/07/20 0248 08/08/20 0403  HGB  --    < >  --   --  9.8*   < >  --  10.4* 9.2*  HCT  --    < >  --   --  34.4*  --   --  34.8* 31.8*  PLT  --   --   --   --  245  --   --  262 251  LABPROT  --   --   --  14.5  --   --   --  15.2 20.8*  INR  --   --   --  1.2  --   --   --  1.2 1.9*  CREATININE 0.47  --   --   --   --   --   --  0.50 0.52  TROPONINIHS  --   --  13  --   --   --  10  --   --    < > = values in this interval not displayed.    Estimated Creatinine Clearance: 68.5 mL/min (by C-G formula based on SCr of 0.52 mg/dL).   Medical History: Past Medical History:  Diagnosis Date  . Anxiety   . COPD (chronic obstructive pulmonary disease) ([place])   . Depression   . Diabetes mellitus without complication ([place])   . Discoid lupus    sees Dr. Allyson Sabal  . Dyspnea   . GERD (gastroesophageal reflux disease)   . History of colonic polyps   . Hx pulmonary embolism   . Hypertension   . Hypothyroidism   . Rheumatoid arthritis(714.0)    sees Dr. Dossie Der    Assessment: 89 YOF presenting with low O2 sats, COVID positive. She is on warfarin for history of VTE and her INR was subtherapeutic on admission. She has been on a Lovenox bridge and received Warfarin on 10/15 and 10/16. Her INR is up to 1.9 today. Pharmacy is now consulted to change her anticoagulation to Apixaban on 10/18. It is possible her INR will rise >2 and it  would be optimal to start her Apixaban when her INR is <2.    Goal of Therapy:  INR 2-3 Monitor platelets by anticoagulation protocol: Yes   Plan:  Continue Lovenox 95 mg (1 mg/kg) subQ every 12 hours Will check her INR 10/18 - if <2 will change to apixaban. If her INR is >2 recommend continuing her Lovenox until her INR drops below 2 and then changing to apixaban at that time.  Legrand Como, PharmD, BCPS, BCIDP Clinical Pharmacist Phone: [phone] Please check AMION for all [place] numbers 08/08/2020, 10:01 AM

## 2020-08-09 DIAGNOSIS — J1282 Pneumonia due to coronavirus disease 2019: Secondary | ICD-10-CM | POA: Diagnosis not present

## 2020-08-09 DIAGNOSIS — U071 COVID-19: Secondary | ICD-10-CM | POA: Diagnosis not present

## 2020-08-09 LAB — COMPREHENSIVE METABOLIC PANEL
ALT: 16 U/L (ref 0–44)
AST: 19 U/L (ref 15–41)
Albumin: 2.9 g/dL — ABNORMAL LOW (ref 3.5–5.0)
Alkaline Phosphatase: 43 U/L (ref 38–126)
Anion gap: 9 (ref 5–15)
BUN: 9 mg/dL (ref 8–23)
CO2: 31 mmol/L (ref 22–32)
Calcium: 8.6 mg/dL — ABNORMAL LOW (ref 8.9–10.3)
Chloride: 94 mmol/L — ABNORMAL LOW (ref 98–111)
Creatinine, Ser: 0.46 mg/dL (ref 0.44–1.00)
GFR, Estimated: >60 mL/min (ref >60)
Glucose, Bld: 95 mg/dL (ref 70–99)
Potassium: 3.9 mmol/L (ref 3.5–5.1)
Sodium: 134 mmol/L — ABNORMAL LOW (ref 135–145)
Total Bilirubin: 0.4 mg/dL (ref 0.3–1.2)
Total Protein: 6 g/dL — ABNORMAL LOW (ref 6.5–8.1)

## 2020-08-09 LAB — GLUCOSE, CAPILLARY
Glucose-Capillary: 97 mg/dL (ref 70–99)
Glucose-Capillary: 106 mg/dL — ABNORMAL HIGH (ref 70–99)
Glucose-Capillary: 199 mg/dL — ABNORMAL HIGH (ref 70–99)
Glucose-Capillary: 122 mg/dL — ABNORMAL HIGH (ref 70–99)

## 2020-08-09 LAB — HEMOGLOBIN A1C
Hgb A1c MFr Bld: 4.7 % — ABNORMAL LOW (ref 4.8–5.6)
Mean Plasma Glucose: 88 mg/dL

## 2020-08-09 LAB — C-REACTIVE PROTEIN: CRP: 1.6 mg/dL — ABNORMAL HIGH (ref <1.0)

## 2020-08-09 LAB — PROTIME-INR
INR: 1.8 — ABNORMAL HIGH (ref 0.8–1.2)
Prothrombin Time: 20.2 seconds — ABNORMAL HIGH (ref 11.4–15.2)

## 2020-08-09 LAB — PATHOLOGIST SMEAR REVIEW

## 2020-08-09 LAB — MAGNESIUM: Magnesium: 1.9 mg/dL (ref 1.7–2.4)

## 2020-08-09 MED ORDER — METHYLPREDNISOLONE SODIUM SUCC 40 MG IJ SOLR: 40.0000 mg | Freq: Two times a day (BID) | INTRAMUSCULAR | Status: DC

## 2020-08-09 MED ORDER — METHYLPREDNISOLONE SODIUM SUCC 40 MG IJ SOLR
40.0000 mg | Freq: Every day | INTRAMUSCULAR | Status: DC
  Administered 2020-08-09 – 2020-08-11 (×3): 40 mg via INTRAVENOUS
  Filled 2020-08-09 (×3): qty 1

## 2020-08-09 MED ORDER — INSULIN GLARGINE 100 UNIT/ML ~~LOC~~ SOLN
5.0000 [IU] | Freq: Every day | SUBCUTANEOUS | Status: DC
  Administered 2020-08-10 – 2020-08-17 (×8): 5 [IU] via SUBCUTANEOUS
  Filled 2020-08-09 (×9): qty 0.05

## 2020-08-09 MED ORDER — INSULIN GLARGINE 100 UNIT/ML ~~LOC~~ SOLN
8.0000 [IU] | Freq: Every day | SUBCUTANEOUS | Status: DC
  Administered 2020-08-09: 8 [IU] via SUBCUTANEOUS
  Filled 2020-08-09: qty 0.08

## 2020-08-09 MED ORDER — APIXABAN 5 MG PO TABS
10.0000 mg | ORAL_TABLET | Freq: Two times a day (BID) | ORAL | Status: AC
  Administered 2020-08-09 – 2020-08-13 (×9): 10 mg via ORAL
  Filled 2020-08-09 (×8): qty 2

## 2020-08-09 MED ORDER — APIXABAN 5 MG PO TABS
5.0000 mg | ORAL_TABLET | Freq: Two times a day (BID) | ORAL | Status: DC
  Administered 2020-08-14 – 2020-08-17 (×7): 5 mg via ORAL
  Filled 2020-08-09 (×8): qty 1

## 2020-08-09 NOTE — Progress Notes (Signed)
PROGRESS NOTE                                                                                                                                                                                                             Patient Demographics:    Jaime Kelley, is a 69 y.o. female, DOB - 03/26/1951, QPR:[id number]  Admit date - 08/06/2020   Admitting Physician Mckinley Jewel, MD  Outpatient Primary MD for the patient is Laurey Morale, MD  LOS - 3  Chief Complaint  Patient presents with  . Shortness of Breath    COVID +       Brief Narrative (HPI from H&P) - Jaime Kelley is a 69 y.o. female with medical history significant of hypertension, type 2 diabetes mellitus, morbid obesity, depression/anxiety, chronic hypoxemic respiratory failure secondary to underlying COPD-on 5 L of oxygen at baseline, RA, hypothyroidism, history of PE/DVT-on Coumadin, GERD presents to emergency department for evaluation of hypoxia.  She was recently admitted on 9/18 with complaints of substernal chest pressure and was found to have large left-sided pleural effusion with complete collapse of left lung requiring nonrebreather initially.  Patient underwent left pleural space drainage and chest tube placement which was removed on 9/23.  Bronchoscopy was performed on 9/20 with removal of solitary mucous plug.  Repeat chest x-ray showed recurrent opacification.  Pulmonology performed a repeat thoracentesis at bedside on 07/24/2020.  Chest x-ray on 10/5 demonstrated a small amount of reaccumulation of pleural effusion however oxygen requirements improved to 3 L therefore she discharged to SNF in a stable condition and received [place] Covid vaccine on 10/5.  Work-up here suggestive of acute PE plus minus possible mild Covid pneumonia.    Subjective:   Patient in bed, appears comfortable, denies any headache, no fever, no chest pain or pressure, no shortness of breath , no abdominal pain. No  focal weakness.    Assessment  & Plan :     1.  Acute on chronic hypoxic respiratory failure in a patient with who has COPD is on 5 L of oxygen at home and now acute PE - she was recently admitted with large left-sided pleural effusion requiring thoracentesis and bronchoscopy for removal of solitary mucous plug.  She also recently received Covid vaccine on 07/27/2020.  She is now here with shortness of breath with minimal left-sided pleural effusion and atelectasis.  Her present acute on chronic hypoxia could be due to mild bronchitis + PE , doubt  she has true COVID-19 pneumonia, for now since started will finish steroids(taper) and Remdesivir, she is also chronically on Coumadin with subtherapeutic INR and now on Lovenox >> Eliquis.  Continue supplemental oxygen note she is on 5 L of nasal cannula oxygen at home.     Recent Labs  Lab 08/06/20 1305 08/06/20 1435 08/06/20 1456 08/06/20 1503 08/06/20 1509 08/06/20 1511 08/07/20 0248 08/08/20 0403 08/09/20 0300  WBC  --   --   --   --   --  3.9* 2.2* 2.4*  --   HGB  --   --  11.6*  --   --  9.8* 10.4* 9.2*  --   HCT  --   --  34.0*  --   --  34.4* 34.8* 31.8*  --   PLT  --   --   --   --   --  245 262 251  --   CRP 5.5*  --   --   --   --   --  6.6* 3.0* 1.6*  BNP  --   --   --  23.3  --   --  38.8 29.6  --   DDIMER 5.89*  --   --   --   --   --  5.09*  --   --   PROCALCITON <0.10  --   --   --   --   --   --   --   --   AST 21  --   --   --   --   --  18 19 19   ALT 13  --   --   --   --   --  15 14 16   ALKPHOS 52  --   --   --   --   --  46 42 43  BILITOT 0.5  --   --   --   --   --  0.3 0.5 0.4  ALBUMIN 3.0*  --   --   --   --   --  2.9* 2.9* 2.9*  INR  --   --   --   --  1.2  --  1.2 1.9* 1.8*  LATICACIDVEN 0.9 1.1  --   --   --   --   --   --   --   SARSCOV2NAA POSITIVE*  --   --   --   --   --   --   --   --      2.  Underlying COPD with 5 L nasal cannula oxygen at home.  Supportive care.  Continue IV steroids.  3.   Patient expresses her wishes to leave AMA, after counseling she has agreed to stay, last admission she was deemed not to have capacity to make her medical decisions, ever in my evaluation she was alert awake oriented x3 and did not have any healthy submissions or delusions, had good insight.  Had discussions with bedside nurses who said patient most of the time is alert awake but intermittently at times does get confused and talks out of her head, psychiatry evaluation was also requested on 08/07/2020 and during their evaluation she was delusional at times, at this time psychiatry has deemed her again not to have capacity to make her own medical decisions.  Patient seems to have intermittent bouts of delusions.  So her home living situation is not appropriate. Stable TSH, B12 and RPR.  4.  Acute  PE with history of DVT.  Was on Coumadin with subtherapeutic INR, has a new PE, placed on Lovenox >> Eliquis on 08/09/2020.  5.  Anxiety and depression on Zoloft and Xanax.  Continue.  6.  Hypothyroidism.  On Synthroid.  7. Essential hypertension.  On Norvasc and ARB.  9. Morbid obesity.  BMI 41.  Follow with PCP.   10. DM type II.  Low-dose Lantus and sliding scale and monitor.  Lab Results  Component Value Date   HGBA1C 6.0 08/14/2017   CBG (last 3)  Recent Labs    08/08/20 1626 08/08/20 2050 08/09/20 0718  GLUCAP 110* 131* 97      Condition - Extremely Guarded  Family Communication  :    Daughter [phone] - 11/08/19  Son Simon Rhein - [phone] -11/08/2019, extremely argumentative and rude and slammed the phone down.  Would not let me finish my conversation.  Patient states that son has no right to interfere with her medical decision making.  Code Status :  Full  Consults  : Psych  Procedures  :    PUD Prophylaxis : PPI  Disposition Plan  :    Status is: Inpatient  Remains inpatient appropriate because:IV treatments appropriate due to intensity of illness or inability to  take PO   Dispo: The patient is from: Home              Anticipated d/c is to: Home              Anticipated d/c date is: > 3 days              Patient currently is not medically stable to d/c.   DVT Prophylaxis  :  Lovenox >> Coumadin  Lab Results  Component Value Date   INR 1.8 (H) 08/09/2020   INR 1.9 (H) 08/08/2020   INR 1.2 08/07/2020     Lab Results  Component Value Date   PLT 251 08/08/2020    Diet :  Diet Order            Diet heart healthy/carb modified Room service appropriate? Yes; Fluid consistency: Thin  Diet effective now                  Inpatient Medications Scheduled Meds: . aerochamber plus with mask  1 each Other Once  . albuterol  2 puff Inhalation Q6H  . amLODipine  10 mg Oral Daily  . vitamin C  500 mg Oral Daily  . enoxaparin (LOVENOX) injection  1 mg/kg Subcutaneous Q12H  . insulin aspart  0-15 Units Subcutaneous TID WC  . insulin aspart  0-5 Units Subcutaneous QHS  . insulin glargine  8 Units Subcutaneous Daily  . levothyroxine  137 mcg Oral QAC breakfast  . losartan  100 mg Oral Daily  . methylPREDNISolone (SOLU-MEDROL) injection  40 mg Intravenous Q12H  . pantoprazole  40 mg Oral Daily  . sertraline  100 mg Oral Daily  . umeclidinium bromide  1 puff Inhalation Daily  . zinc sulfate  220 mg Oral Daily   Continuous Infusions: . sodium chloride    . remdesivir 100 mg in NS 100 mL 100 mg (08/09/20 0821)   PRN Meds:.sodium chloride, acetaminophen, ALPRAZolam, chlorpheniramine-HYDROcodone, guaiFENesin-dextromethorphan, [DISCONTINUED] ondansetron **OR** ondansetron (ZOFRAN) IV  Antibiotics  :   Anti-infectives (From admission, onward)   Start     Dose/Rate Route Frequency Ordered Stop   08/07/20 1000  remdesivir 100 mg in sodium chloride 0.9 % 100 mL  IVPB       "Followed by" Linked Group Details   100 mg 200 mL/hr over 30 Minutes Intravenous Daily 08/06/20 1449 08/11/20 0959   08/07/20 1000  remdesivir 100 mg in sodium chloride 0.9  % 100 mL IVPB  Status:  Discontinued       "Followed by" Linked Group Details   100 mg 200 mL/hr over 30 Minutes Intravenous Daily 08/06/20 1503 08/06/20 1506   08/06/20 1700  remdesivir 200 mg in sodium chloride 0.9% 250 mL IVPB       "Followed by" Linked Group Details   200 mg 580 mL/hr over 30 Minutes Intravenous Once 08/06/20 1449 08/07/20 0345   08/06/20 1515  remdesivir 200 mg in sodium chloride 0.9% 250 mL IVPB  Status:  Discontinued       "Followed by" Linked Group Details   200 mg 580 mL/hr over 30 Minutes Intravenous Once 08/06/20 1503 08/06/20 1506          Objective:   Vitals:   08/08/20 2049 08/09/20 0000 08/09/20 0400 08/09/20 0719  BP: 128/69 140/64 (!) 166/96 140/74  Pulse: 78 75 76 69  Resp: (!) 22 17 18 20   Temp: 98 F (36.7 C) 98 F (36.7 C) 98.2 F (36.8 C) 98.1 F (36.7 C)  TempSrc: Axillary Axillary Oral Oral  SpO2: 94% 98% 97% 93%  Weight:      Height:        SpO2: 93 % O2 Flow Rate (L/min): 3 L/min  Wt Readings from Last 3 Encounters:  08/06/20 95.3 kg  07/27/20 81.6 kg  10/24/18 103.1 kg     Intake/Output Summary (Last 24 hours) at 08/09/2020 0947 Last data filed at 08/09/2020 0800 Gross per 24 hour  Intake 240 ml  Output 950 ml  Net -710 ml     Physical Exam  Awake Alert, No new F.N deficits,   [place].AT,PERRAL Supple Neck,No JVD, No cervical lymphadenopathy appriciated.  Symmetrical Chest wall movement, Good air movement bilaterally, CTAB RRR,No Gallops, Rubs or new Murmurs, No Parasternal Heave +ve B.Sounds, Abd Soft, No tenderness, No organomegaly appriciated, No rebound - guarding or rigidity. No Cyanosis, Clubbing or edema, No new Rash or bruise    Pressure Injury 07/10/20 Buttocks Left Stage 2 -  Partial thickness loss of dermis presenting as a shallow open injury with a red, pink wound bed without slough. (Active)  07/10/20 2330  Location: Buttocks  Location Orientation: Left  Staging: Stage 2 -  Partial thickness loss  of dermis presenting as a shallow open injury with a red, pink wound bed without slough.  Wound Description (Comments):   Present on Admission: Yes     Pressure Injury 07/11/20 Buttocks Left Stage 2 -  Partial thickness loss of dermis presenting as a shallow open injury with a red, pink wound bed without slough. (Active)  07/11/20 2330  Location: Buttocks  Location Orientation: Left  Staging: Stage 2 -  Partial thickness loss of dermis presenting as a shallow open injury with a red, pink wound bed without slough.  Wound Description (Comments):   Present on Admission: Yes     Data Review:   Recent Labs  Lab 08/06/20 1456 08/06/20 1511 08/07/20 0248 08/08/20 0403  WBC  --  3.9* 2.2* 2.4*  HGB 11.6* 9.8* 10.4* 9.2*  HCT 34.0* 34.4* 34.8* 31.8*  PLT  --  245 262 251  MCV  --  78.5* 77.9* 77.0*  MCH  --  22.4* 23.3* 22.3*  MCHC  --  28.5* 29.9* 28.9*  RDW  --  Not Measured Not Measured Not Measured  LYMPHSABS  --  1.1 0.7 1.1  MONOABS  --  0.4 0.1 0.3  EOSABS  --  0.0 0.0 0.0  BASOSABS  --  0.0 0.0 0.0    Recent Labs  Lab 08/06/20 1305 08/06/20 1435 08/06/20 1456 08/06/20 1503 08/06/20 1509 08/07/20 0248 08/08/20 0403 08/08/20 1031 08/09/20 0300  NA 128*  --  128*  --   --  131* 133*  --  134*  K 3.9  --  3.8  --   --  3.9 4.0  --  3.9  CL 89*  --   --   --   --  92* 92*  --  94*  CO2 28  --   --   --   --  30 33*  --  31  GLUCOSE 89  --   --   --   --  127* 101*  --  95  BUN 7*  --   --   --   --  6* 10  --  9  CREATININE 0.47  --   --   --   --  0.50 0.52  --  0.46  CALCIUM 8.4*  --   --   --   --  8.7* 8.5*  --  8.6*  AST 21  --   --   --   --  18 19  --  19  ALT 13  --   --   --   --  15 14  --  16  ALKPHOS 52  --   --   --   --  46 42  --  43  BILITOT 0.5  --   --   --   --  0.3 0.5  --  0.4  ALBUMIN 3.0*  --   --   --   --  2.9* 2.9*  --  2.9*  MG  --   --   --   --   --  1.9 1.9  --  1.9  CRP 5.5*  --   --   --   --  6.6* 3.0*  --  1.6*  DDIMER 5.89*  --    --   --   --  5.09*  --   --   --   PROCALCITON <0.10  --   --   --   --   --   --   --   --   LATICACIDVEN 0.9 1.1  --   --   --   --   --   --   --   INR  --   --   --   --  1.2 1.2 1.9*  --  1.8*  TSH  --   --   --   --   --   --   --  1.275  --   BNP  --   --   --  23.3  --  38.8 29.6  --   --     Recent Labs  Lab 08/06/20 1305 08/06/20 1503 08/07/20 0248 08/08/20 0403 08/09/20 0300  CRP 5.5*  --  6.6* 3.0* 1.6*  DDIMER 5.89*  --  5.09*  --   --   BNP  --  23.3 38.8 29.6  --   PROCALCITON <0.10  --   --   --   --   SARSCOV2NAA  POSITIVE*  --   --   --   --     ------------------------------------------------------------------------------------------------------------------ Recent Labs    08/06/20 1305  TRIG 101    Lab Results  Component Value Date   HGBA1C 6.0 08/14/2017   ------------------------------------------------------------------------------------------------------------------ Recent Labs    08/08/20 1031  TSH 1.275   ------------------------------------------------------------------------------------------------------------------ Recent Labs    08/06/20 1305 08/07/20 0248 08/08/20 1031  VITAMINB12  --   --  363  FERRITIN 225 260  --     Coagulation profile Recent Labs  Lab 08/06/20 1509 08/07/20 0248 08/08/20 0403 08/09/20 0300  INR 1.2 1.2 1.9* 1.8*    Recent Labs    08/06/20 1305 08/07/20 0248  DDIMER 5.89* 5.09*    Cardiac Enzymes No results for input(s): CKMB, TROPONINI, MYOGLOBIN in the last 168 hours.  Invalid input(s): CK ------------------------------------------------------------------------------------------------------------------    Component Value Date/Time   BNP 29.6 08/08/2020 0403    Micro Results Recent Results (from the past 240 hour(s))  Blood Culture (routine x 2)     Status: None (Preliminary result)   Collection Time: 08/06/20  1:00 PM   Specimen: BLOOD  Result Value Ref Range Status   Specimen  Description BLOOD RIGHT ANTECUBITAL  Final   Special Requests   Final    BOTTLES DRAWN AEROBIC AND ANAEROBIC Blood Culture results may not be optimal due to an inadequate volume of blood received in culture bottles   Culture   Final    NO GROWTH 3 DAYS Performed at [facility], [place] [address] [place], [place] [address]    Report Status PENDING  Incomplete  Respiratory Panel by RT PCR (Flu A&B, Covid) - Nasopharyngeal Swab     Status: Abnormal   Collection Time: 08/06/20  1:05 PM   Specimen: Nasopharyngeal Swab  Result Value Ref Range Status   SARS Coronavirus 2 by RT PCR POSITIVE (A) NEGATIVE Final    Comment: emailed L. Berdik RN 14:40 08/06/20 ([email or web address]) (NOTE) SARS-CoV-2 target nucleic acids are DETECTED.  SARS-CoV-2 RNA is generally detectable in upper respiratory specimens  during the acute phase of infection. Positive results are indicative of the presence of the identified virus, but do not rule out bacterial infection or co-infection with other pathogens not detected by the test. Clinical correlation with patient history and other diagnostic information is necessary to determine patient infection status. The expected result is Negative.  Fact Sheet for Patients:  PinkCheek.be  Fact Sheet for Healthcare Providers: GravelBags.it  This test is not yet approved or cleared by the [place] FDA and  has been authorized for detection and/or diagnosis of SARS-CoV-2 by FDA under an Emergency Use Authorization (EUA).  This EUA will remain in effect (meaning this test can be used) for the duration of  the COVID-19  declaration under Section 564(b)(1) of the Act, 21 U.S.C. section 360bbb-3(b)(1), unless the authorization is terminated or revoked sooner.      Influenza A by PCR NEGATIVE NEGATIVE Final   Influenza B by PCR NEGATIVE NEGATIVE Final    Comment: (NOTE) The Xpert Xpress SARS-CoV-2/FLU/RSV assay is  intended as an aid in  the diagnosis of influenza from Nasopharyngeal swab specimens and  should not be used as a sole basis for treatment. Nasal washings and  aspirates are unacceptable for Xpert Xpress SARS-CoV-2/FLU/RSV  testing.  Fact Sheet for Patients: PinkCheek.be  Fact Sheet for Healthcare Providers: GravelBags.it  This test is not yet approved or cleared by the [place] FDA and  has  been authorized for detection and/or diagnosis of SARS-CoV-2 by  FDA under an Emergency Use Authorization (EUA). This EUA will remain  in effect (meaning this test can be used) for the duration of the  Covid-19 declaration under Section 564(b)(1) of the Act, 21  U.S.C. section 360bbb-3(b)(1), unless the authorization is  terminated or revoked. Performed at [facility], [place] [address] [place], [place] [address]   Blood Culture (routine x 2)     Status: None (Preliminary result)   Collection Time: 08/06/20  1:22 PM   Specimen: BLOOD RIGHT WRIST  Result Value Ref Range Status   Specimen Description BLOOD RIGHT WRIST  Final   Special Requests   Final    BOTTLES DRAWN AEROBIC AND ANAEROBIC Blood Culture results may not be optimal due to an inadequate volume of blood received in culture bottles   Culture   Final    NO GROWTH 3 DAYS Performed at [facility], [place] [address], [place], [place] [address]    Report Status PENDING  Incomplete  MRSA PCR Screening     Status: None   Collection Time: 08/07/20  6:23 AM   Specimen: Nasal Mucosa; Nasopharyngeal  Result Value Ref Range Status   MRSA by PCR NEGATIVE NEGATIVE Final    Comment:        The GeneXpert MRSA Assay (FDA approved for NASAL specimens only), is one component of a comprehensive MRSA colonization surveillance program. It is not intended to diagnose MRSA infection nor to guide or monitor treatment for MRSA infections. Performed at [facility],  [place] [address], [place], [place] [address]     Radiology Reports DG Chest 2 View  Result Date: 07/26/2020 CLINICAL DATA:  Left pleural effusion EXAM: CHEST - 2 VIEW COMPARISON:  07/24/2020 FINDINGS: Small left pleural effusion is increased. Left basilar atelectasis is present. No pneumothorax. Background changes of emphysema. Stable cardiomediastinal contours. IMPRESSION: Small left pleural effusion increased since 07/24/2020. Mild left basilar atelectasis. Electronically Signed   By: Macy Mis M.D.   On: 07/26/2020 08:07   CT Head Wo Contrast  Result Date: 07/10/2020 CLINICAL DATA:  Delirium EXAM: CT HEAD WITHOUT CONTRAST TECHNIQUE: Contiguous axial images were obtained from the base of the skull through the vertex without intravenous contrast. COMPARISON:  None. FINDINGS: Brain: Normal anatomic configuration. Parenchymal volume loss is advanced in relation to the patient's age. Moderate periventricular white matter changes are present likely reflecting the sequela of small vessel ischemia. Remote left pontine infarct. No abnormal intra or extra-axial mass lesion or fluid collection. No abnormal mass effect or midline shift. No evidence of acute intracranial hemorrhage or infarct. Borderline ventriculomegaly is commensurate with the degree of cerebral atrophy and likely reflects central atrophy. Cerebellum unremarkable. Vascular: No asymmetric hyperdense vasculature at the skull base. Skull: Intact Sinuses/Orbits: Moderate mucosal thickening and air-fluid level noted within the a sphenoid sinus. Osteoma noted within the left frontal sinus. Remaining paranasal sinuses are clear. Orbits are unremarkable. Other: There is near complete fluid opacification of the mastoid air cells and middle ear cavities bilaterally. No definite superimposed osseous erosion. IMPRESSION: No evidence of acute intracranial hemorrhage or infarct. Relatively advanced senescent changes.  Remote left pontine infarct. Extensive fluid  opacification of the mastoid air cells and middle ear cavities. Correlation for otomastoiditis is recommended. Sphenoid sinusitis. Electronically Signed   By: Fidela Salisbury MD   On: 07/10/2020 19:35   CT CHEST WO CONTRAST  Result Date: 07/23/2020 CLINICAL DATA:  Abnormal  radiograph, pleural effusion EXAM: CT CHEST WITHOUT CONTRAST TECHNIQUE: Multidetector CT imaging of the chest was performed following the standard protocol without IV contrast. COMPARISON:  Radiograph 07/21/2020, CT 07/10/2020 FINDINGS: Cardiovascular: Cardiac size within normal limits. Small volume pericardial fluid is similar to prior. Dense calcifications along the mitral annulus. Additional calcifications of the aortic leaflets. Hypoattenuation of the cardiac blood pool compatible with anemia. Extensive three-vessel native coronary artery atherosclerosis. Atherosclerotic plaque within the normal caliber aorta. No hyperdense mural thickening or plaque displacement nor periaortic stranding or hemorrhage. Shared origin of the brachiocephalic and left common carotid arteries. Additional calcifications seen throughout the proximal great vessels. Central pulmonary arteries are normal caliber. Luminal evaluation precluded in the absence of contrast media. No visible worrisome venous abnormalities. Mediastinum/Nodes: Persistent slight leftward mediastinal shift, similar to comparison. No mediastinal fluid, gas or hemorrhage. Redemonstration of multiple asymmetrically prominent though non pathologically enlarged left axillary and mediastinal lymph nodes. No right axillary adenopathy. Hilar nodal is severely limited in the absence of contrast media. No acute abnormality of the trachea or esophagus. Thyroid gland is diminutive, correlate for prior thyroidectomy or ablation. Lungs/Pleura: Background of moderate centrilobular emphysematous changes throughout both lungs. There is a moderate residual left pleural effusion with adjacent areas of passive  atelectasis in the left lung base. Some additional thickening is noted along the left major fissure which may reflect some persistent loculated fluid. Adjacent atelectatic changes are present along the left major fissure as well. Predominantly involving the lingula and posterior segment left upper lobe. Underlying consolidation would be difficult to exclude. Some there is a stable sub solid opacity in the posterosuperior segment right lower lobe (4/74 measuring up to 8 mm. Possibly post infectious or inflammatory. Stable scarring within the anterior basal segment right lower lobe and major fissure. Right lung is otherwise clear. Upper Abdomen: Multiple calcified gallstones within the largely decompressed gallbladder. Scattered colonic diverticula without focal inflammation to suggest diverticulitis. Extensive upper abdominal atherosclerotic calcification. Stable likely parapelvic cyst in the upper pole left kidney. Musculoskeletal: Mild body wall edema, left slightly more pronounced than right. No acute abnormality or worrisome osseous lesions. Levocurvature of the upper thoracic spine with some mild dextrocurvature of the mid to lower levels. Multilevel discogenic and facet degenerative changes are present. Additional degenerative changes of the right shoulder and sternoclavicular joints similar to prior. IMPRESSION: 1. Moderate residual left pleural effusion with adjacent areas of passive atelectasis in the left lung base. Some additional thickening along the left major fissure may reflect some persistent loculated fluid with some further atelectatic change in the lingula as well. Underlying consolidation within the atelectatic lung would cannot be fully excluded. 2. Stable appearance of an 8 mm ground-glass opacity in the posterosuperior segment right lower lobe. Possibly post infectious or inflammatory. Recommend attention on follow-up imaging. Initial follow-up with CT at 6-12 months is recommended to confirm  persistence. If persistent, repeat CT is recommended every 2 years until 5 years of stability has been established. This recommendation follows the consensus statement: Guidelines for Management of Incidental Pulmonary Nodules Detected on CT Images: From the Fleischner Society 2017; Radiology 2017; 284:228-243. 3. Redemonstration of multiple asymmetrically prominent though non pathologically enlarged left axillary and mediastinal lymph nodes, nonspecific though possibly reactive or edematous. 4. Cholelithiasis without evidence of acute cholecystitis. 5. Colonic diverticulosis without evidence of diverticulitis. 6. Aortic Atherosclerosis (ICD10-I70.0) 7. Three-vessel coronary artery calcifications. 8. Emphysema (ICD10-J43.9). Electronically Signed   By: Lovena Le M.D.   On: 07/23/2020 22:26   CT  Chest W Contrast  Result Date: 07/10/2020 CLINICAL DATA:  Pleural effusion EXAM: CT CHEST WITH CONTRAST TECHNIQUE: Multidetector CT imaging of the chest was performed during intravenous contrast administration. CONTRAST:  3mL OMNIPAQUE IOHEXOL 300 MG/ML  SOLN COMPARISON:  None. FINDINGS: Cardiovascular: There is extensive coronary artery calcification. Global cardiac size within normal limits. Left ventricular hypertrophy noted. Moderate calcification of the mitral valve annulus. No pericardial effusion. The central pulmonary arteries are of normal caliber. The thoracic aorta is of normal caliber. There is extensive atherosclerotic calcifications seen within the arch vasculature proximally, particularly the left subclavian artery, however, the degree of stenosis is not well assessed on this non arteriographic study. Scattered mild atherosclerotic calcifications seen within the descending thoracic aorta., Mediastinum/Nodes: No pathologic adenopathy. Thyroid unremarkable. Esophagus unremarkable. Lungs/Pleura: A large left pleural effusion is present with complete opacification of the left hemithorax and complete  collapse of the left lung. There is debris seen within the left mainstem bronchus. Moderate emphysema involving the aerated right lung. No superimposed focal pulmonary infiltrate or nodule. No pneumothorax or pleural effusion on the right. Upper Abdomen: No acute abnormality. Musculoskeletal: Osseous structures are age-appropriate. No acute bone abnormality. IMPRESSION: Large left pleural effusion with complete collapse of the left lung. Moderate debris within the left mainstem bronchus. Extensive coronary artery calcification. Peripheral vascular disease with extensive calcification of the origin of the left subclavian artery. If there is suggestion of hemodynamically significant stenosis (subclavian steal, left upper extremity claudication), this would be better assessed with CT arteriography. Aortic Atherosclerosis (ICD10-I70.0) and Emphysema (ICD10-J43.9). Electronically Signed   By: Fidela Salisbury MD   On: 07/10/2020 19:45   CT ANGIO CHEST PE W OR WO CONTRAST  Result Date: 08/07/2020 CLINICAL DATA:  PE suspected, established right-sided PE, increased shortness of breath, assess embolus burden EXAM: CT ANGIOGRAPHY CHEST WITH CONTRAST TECHNIQUE: Multidetector CT imaging of the chest was performed using the standard protocol during bolus administration of intravenous contrast. Multiplanar CT image reconstructions and MIPs were obtained to evaluate the vascular anatomy. CONTRAST:  6mL OMNIPAQUE IOHEXOL 350 MG/ML SOLN COMPARISON:  None. FINDINGS: Cardiovascular: Examination for pulmonary embolism is somewhat limited by breath motion artifact. Within this limitation, there are occasional probable subsegmental emboli, for example in the dependent right lower lobe (series 6, image 76) and in the dependent left lower lobe (series 6, image 63). There is no segmental or more central emboli identified. Aortic atherosclerosis. Normal heart size. Three-vessel coronary artery calcifications. Enlargement of the main  pulmonary artery measuring up to 3.6 cm in caliber. Small pericardial effusion. Mediastinum/Nodes: No enlarged mediastinal, hilar, or axillary lymph nodes. Thyroid gland, trachea, and esophagus demonstrate no significant findings. Lungs/Pleura: Moderate centrilobular emphysema. Moderate left pleural effusion and associated atelectasis or consolidation, similar to prior examination. Upper Abdomen: No acute abnormality. Musculoskeletal: No chest wall abnormality. No acute or significant osseous findings. Review of the MIP images confirms the above findings. IMPRESSION: 1. Examination for pulmonary embolism is somewhat limited by breath motion artifact. Within this limitation, there are occasional probable subsegmental emboli present bilaterally. No segmental or more central embolus identified. 2. Moderate left pleural effusion and associated atelectasis or consolidation, similar to prior examination. 3. Emphysema (ICD10-J43.9). 4. Coronary artery disease. Aortic Atherosclerosis (ICD10-I70.0) 5. Enlargement of the main pulmonary artery measuring up to 3.6 cm in caliber, as can be seen in pulmonary hypertension. Electronically Signed   By: Eddie Candle M.D.   On: 08/07/2020 15:33   DG Chest Port 1 View  Result Date: 08/06/2020 CLINICAL  DATA:  COVID-19 positive with shortness of breath EXAM: PORTABLE CHEST 1 VIEW COMPARISON:  July 26, 2020 FINDINGS: There is left lower lobe airspace opacity with small left pleural effusion. The lungs elsewhere are clear. The heart is upper normal in size with pulmonary vascularity normal. No adenopathy. There is aortic atherosclerosis. No bone lesions. IMPRESSION: Small left pleural effusion. Airspace opacity in the left lower lobe concerning for pneumonia with likely associated atelectasis. Lungs elsewhere clear. Stable cardiac silhouette. Aortic Atherosclerosis (ICD10-I70.0). Electronically Signed   By: Lowella Grip III M.D.   On: 08/06/2020 13:31   DG CHEST PORT 1  VIEW  Result Date: 07/24/2020 CLINICAL DATA:  Status post left thoracentesis EXAM: PORTABLE CHEST 1 VIEW COMPARISON:  07/21/2020 FINDINGS: Trace left pleural effusion, improved status post thoracentesis. No pneumothorax is seen. Right lung is essentially clear. The heart is normal in size.  Thoracic aortic atherosclerosis. IMPRESSION: Trace left pleural effusion, improved status post thoracentesis. No pneumothorax is seen. Electronically Signed   By: Julian Hy M.D.   On: 07/24/2020 13:01   DG Chest Port 1 View  Result Date: 07/21/2020 CLINICAL DATA:  Shortness of breath EXAM: PORTABLE CHEST 1 VIEW COMPARISON:  July 15, 2020 FINDINGS: There is consolidation throughout the left mid and lower lung regions with questionable superimposed left pleural effusion. The right lung is clear. Heart is upper normal in size with pulmonary vascularity normal. No adenopathy. There is aortic atherosclerosis. No bone lesions. IMPRESSION: Consolidation throughout much of the left mid and lower lung regions with suspected small pleural effusions superimposed on the left. Right lung clear. Heart upper normal in size. No adenopathy. Aortic Atherosclerosis (ICD10-I70.0). Electronically Signed   By: Lowella Grip III M.D.   On: 07/21/2020 14:53   DG Chest Port 1 View  Result Date: 07/15/2020 CLINICAL DATA:  Left chest tube EXAM: PORTABLE CHEST 1 VIEW COMPARISON:  07/14/2020 FINDINGS: Slight retraction of the left small bore chest tube. No significant pneumothorax by plain radiography. Persistent left basilar collapse/consolidation. No enlarging effusion. Stable right lung aeration. Heart remains enlarged. Aorta atherosclerotic.  Degenerative changes noted of the spine. IMPRESSION: Slight retraction of the left chest tube. No current significant pneumothorax by plain radiography Persistent left lower lung collapse/consolidation. Aortic Atherosclerosis (ICD10-I70.0). Electronically Signed   By: Jerilynn Mages.  Shick M.D.   On:  07/15/2020 08:00   DG Chest Port 1 View  Result Date: 07/14/2020 CLINICAL DATA:  Chest tube present EXAM: PORTABLE CHEST 1 VIEW COMPARISON:  July 14, 2020 study obtained earlier in the day. FINDINGS: Chest tube on the left is unchanged in position. No evident pneumothorax. There is bibasilar atelectatic change, somewhat more on the left than on the right, stable. No new opacity evident. Heart is upper normal in size with pulmonary vascularity normal. No adenopathy. There is aortic atherosclerosis. No bone lesions. There is bilateral carotid artery calcification. IMPRESSION: Chest tube unchanged on the left without evident pneumothorax. Lower lung region atelectasis is stable. No new opacity. Stable cardiac silhouette. Aortic Atherosclerosis (ICD10-I70.0). There is bilateral carotid artery calcification. Electronically Signed   By: Lowella Grip III M.D.   On: 07/14/2020 13:25   DG Chest Port 1 View  Result Date: 07/14/2020 CLINICAL DATA:  Chest tube surveillance EXAM: PORTABLE CHEST 1 VIEW COMPARISON:  07/13/2020 FINDINGS: Left-sided chest tube remains in place. Stable cardiomegaly. Aortic atherosclerosis. Suspect small residual left-sided pleural effusion. Persistent bibasilar atelectasis. No pneumothorax is seen. IMPRESSION: 1. Left-sided chest tube remains in place. No pneumothorax. 2. Persistent bibasilar atelectasis  and probable small residual left pleural effusion. Electronically Signed   By: Davina Poke D.O.   On: 07/14/2020 08:18   DG Chest Port 1 View  Result Date: 07/13/2020 CLINICAL DATA:  69 year old female with pleural effusion, chest tube placed. EXAM: PORTABLE CHEST 1 VIEW COMPARISON:  Portable chest 07/12/2020 and earlier. FINDINGS: Portable AP semi upright view at 1222 hours. Left chest tube position has not significantly changed. No pneumothorax. No residual pleural effusion is evident now. Mild patchy residual opacity at the left lung base. Mediastinal contours are within  normal limits. Allowing for portable technique the right lung appears clear. Negative trachea. No acute osseous abnormality identified. IMPRESSION: 1. Stable left chest tube. Residual atelectasis but no pneumothorax or residual pleural effusion. 2. No new cardiopulmonary abnormality. Electronically Signed   By: Genevie Ann M.D.   On: 07/13/2020 12:41   DG CHEST PORT 1 VIEW  Result Date: 07/12/2020 CLINICAL DATA:  Left pleural effusion EXAM: PORTABLE CHEST 1 VIEW COMPARISON:  07/11/2020 FINDINGS: Decreasing left pleural effusion with left chest tube in place. Moderate to large left effusion remains with improved aeration in the left lung. Continued left lower lobe atelectasis or infiltrate. No confluent opacity on the right. No pneumothorax. Aortic atherosclerosis. IMPRESSION: Decreasing left effusion. The left effusion remains moderate to large with left lower lobe atelectasis or infiltrate. Left chest tube in place.  No visible pneumothorax. Electronically Signed   By: Rolm Baptise M.D.   On: 07/12/2020 15:56   DG CHEST PORT 1 VIEW  Result Date: 07/11/2020 CLINICAL DATA:  Status post thoracocentesis EXAM: PORTABLE CHEST 1 VIEW COMPARISON:  July 10, 2020 FINDINGS: The cardiomediastinal silhouette is completely obscured.Persistent complete opacification of the LEFT hemithorax. Large LEFT pleural effusion. No significant pneumothorax. RIGHT lung is clear. Visualized abdomen is unremarkable. Multilevel degenerative changes of the thoracic spine. IMPRESSION: Persistent complete opacification of the LEFT hemithorax. No significant pneumothorax. Electronically Signed   By: Valentino Saxon MD   On: 07/11/2020 10:30   DG Chest Portable 1 View  Result Date: 07/10/2020 CLINICAL DATA:  Chest pain, shortness of breath, COPD EXAM: PORTABLE CHEST 1 VIEW COMPARISON:  01/18/2017 FINDINGS: There is total opacification of the left hemithorax, which is incompletely imaged due to angulation of radiograph and exclusion  inferiorly. There is significant volume loss with leftward shift of the trachea and mediastinal contents. The right lung is normally aerated. IMPRESSION: There is total opacification of the left hemithorax, which is incompletely imaged due to angulation of radiograph and exclusion inferiorly. There is significant volume loss with leftward shift of the trachea and mediastinal contents. Findings are consistent with a large pleural effusion and total atelectasis or consolidation of the left lung, and generally concerning for malignancy. Recommend CT to further evaluate. Electronically Signed   By: Eddie Candle M.D.   On: 07/10/2020 18:25    Time Spent in minutes  30   Lala Lund M.D on 08/09/2020 at 9:47 AM  To page go to [email or web address] - password [facility]

## 2020-08-09 NOTE — Progress Notes (Signed)
ANTICOAGULATION CONSULT NOTE - Initial Consult  Pharmacy Consult for Lovenox bridge/warfarin >> apixaban Indication: Hx VTE>>new PE  Allergies  Allergen Reactions  . Effexor [Venlafaxine Hydrochloride] Rash    Patient Measurements: Height: 5' (152.4 cm) Weight: 95.3 kg (210 lb) IBW/kg (Calculated) : 45.5  Vital Signs: Temp: 98.1 F (36.7 C) (10/18 0719) Temp Source: Oral (10/18 0719) BP: 140/74 (10/18 0719) Pulse Rate: 69 (10/18 0719)  Labs: Recent Labs    08/06/20 1503 08/06/20 1509 08/06/20 1511 08/06/20 1707 08/07/20 0248 08/08/20 0403 08/09/20 0300  HGB  --    < > 9.8*  --  10.4* 9.2*  --   HCT  --    < > 34.4*  --  34.8* 31.8*  --   PLT  --   --  245  --  262 251  --   LABPROT  --    < >  --   --  15.2 20.8* 20.2*  INR  --    < >  --   --  1.2 1.9* 1.8*  CREATININE  --    < >  --   --  0.50 0.52 0.46  TROPONINIHS 13  --   --  10  --   --   --    < > = values in this interval not displayed.    Estimated Creatinine Clearance: 68.5 mL/min (by C-G formula based on SCr of 0.46 mg/dL).   Medical History: Past Medical History:  Diagnosis Date  . Anxiety   . COPD (chronic obstructive pulmonary disease) ([place])   . Depression   . Diabetes mellitus without complication ([place])   . Discoid lupus    sees Dr. Allyson Sabal  . Dyspnea   . GERD (gastroesophageal reflux disease)   . History of colonic polyps   . Hx pulmonary embolism   . Hypertension   . Hypothyroidism   . Rheumatoid arthritis(714.0)    sees Dr. Dossie Der    Assessment: 13 YOF presenting with low O2 sats, COVID positive. She is on warfarin for history of VTE and her INR was subtherapeutic on admission. She has been on a Lovenox bridge and received Warfarin on 10/15 and 10/16.   Her INR is 1.8 today. We will transition her to PO apixaban today. We will load for another 5 day since it's a new PE.   Goal of Therapy:  Monitor platelets by anticoagulation protocol: Yes   Plan:  Dc lovenox Apixaban 10mg  PO BID  x 9 more doses then 5mg  PO BID  Onnie Boer, PharmD, BCIDP, AAHIVP, CPP Infectious Disease Pharmacist 08/09/2020 10:57 AM

## 2020-08-09 NOTE — TOC Benefit Eligibility Note (Signed)
Transition of Care [facility]) Benefit Eligibility Note    Patient Details  Name: BARBERA PERRITT MRN: [id number] Date of Birth: 1951/05/25   Medication/Dose: Eliquis 5 mg. for 30 day supply / Mail Order 90 day supply  Covered?: Yes  Tier: 3 Drug  Prescription Coverage [facility]: [facility]     MAIL ORDER [employer]  Spoke with Person/Company/Phone Number:: Marcha Dutton W/[employer],Help Desk [phone]  Co-Pay: &9.20  Prior Approval: No  Deductible: Met       Shelda Altes Phone Number: 08/09/2020, 11:08 AM

## 2020-08-09 NOTE — Progress Notes (Signed)
CSW spoke with [facility]missions regarding patient's ability to return COVID+. They reported patient would have to go to their [place]. CSW will follow up with patient's daughter as well as Miquel Dunn and [place].   Emanuele Mcwhirter LCSW

## 2020-08-09 NOTE — Discharge Instructions (Signed)
Information on my medicine - ELIQUIS (apixaban)  This medication education was reviewed with me or my healthcare representative as part of my discharge preparation.  The pharmacist that spoke with me during my hospital stay was:  Onnie Boer, RPH-CPP  Why was Eliquis prescribed for you? Eliquis was prescribed to treat blood clots that may have been found in the veins of your legs (deep vein thrombosis) or in your lungs (pulmonary embolism) and to reduce the risk of them occurring again.  What do You need to know about Eliquis ? The starting dose is 10 mg (two 5 mg tablets) taken TWICE daily for the FIRST SEVEN (7) DAYS, then on (enter date)  08/14/20  the dose is reduced to ONE 5 mg tablet taken TWICE daily.  Eliquis may be taken with or without food.   Try to take the dose about the same time in the morning and in the evening. If you have difficulty swallowing the tablet whole please discuss with your pharmacist how to take the medication safely.  Take Eliquis exactly as prescribed and DO NOT stop taking Eliquis without talking to the doctor who prescribed the medication.  Stopping may increase your risk of developing a new blood clot.  Refill your prescription before you run out.  After discharge, you should have regular check-up appointments with your healthcare provider that is prescribing your Eliquis.    What do you do if you miss a dose? If a dose of ELIQUIS is not taken at the scheduled time, take it as soon as possible on the same day and twice-daily administration should be resumed. The dose should not be doubled to make up for a missed dose.  Important Safety Information A possible side effect of Eliquis is bleeding. You should call your healthcare provider right away if you experience any of the following: ? Bleeding from an injury or your nose that does not stop. ? Unusual colored urine (red or dark brown) or unusual colored stools (red or black). ? Unusual bruising for  unknown reasons. ? A serious fall or if you hit your head (even if there is no bleeding).  Some medicines may interact with Eliquis and might increase your risk of bleeding or clotting while on Eliquis. To help avoid this, consult your healthcare provider or pharmacist prior to using any new prescription or non-prescription medications, including herbals, vitamins, non-steroidal anti-inflammatory drugs (NSAIDs) and supplements.  This website has more information on Eliquis (apixaban): http://www.eliquis.com/eliquis/home

## 2020-08-10 ENCOUNTER — Encounter (HOSPITAL_COMMUNITY): Payer: Self-pay | Admitting: Internal Medicine

## 2020-08-10 DIAGNOSIS — J9601 Acute respiratory failure with hypoxia: Secondary | ICD-10-CM

## 2020-08-10 DIAGNOSIS — J439 Emphysema, unspecified: Secondary | ICD-10-CM | POA: Diagnosis not present

## 2020-08-10 DIAGNOSIS — U071 COVID-19: Secondary | ICD-10-CM | POA: Diagnosis not present

## 2020-08-10 LAB — CBC WITH DIFFERENTIAL/PLATELET
Abs Immature Granulocytes: 0.03 10*3/uL (ref 0.00–0.07)
Basophils Absolute: 0 10*3/uL (ref 0.0–0.1)
Basophils Relative: 0 %
Eosinophils Absolute: 0 10*3/uL (ref 0.0–0.5)
Eosinophils Relative: 0 %
HCT: 33.3 % — ABNORMAL LOW (ref 36.0–46.0)
Hemoglobin: 9.7 g/dL — ABNORMAL LOW (ref 12.0–15.0)
Immature Granulocytes: 1 %
Lymphocytes Relative: 55 %
Lymphs Abs: 1.9 10*3/uL (ref 0.7–4.0)
MCH: 22.7 pg — ABNORMAL LOW (ref 26.0–34.0)
MCHC: 29.1 g/dL — ABNORMAL LOW (ref 30.0–36.0)
MCV: 78 fL — ABNORMAL LOW (ref 80.0–100.0)
Monocytes Absolute: 0.4 10*3/uL (ref 0.1–1.0)
Monocytes Relative: 13 %
Neutro Abs: 1.1 10*3/uL — ABNORMAL LOW (ref 1.7–7.7)
Neutrophils Relative %: 31 %
Platelets: 246 10*3/uL (ref 150–400)
RBC: 4.27 MIL/uL (ref 3.87–5.11)
WBC: 3.4 10*3/uL — ABNORMAL LOW (ref 4.0–10.5)
nRBC: 0 % (ref 0.0–0.2)

## 2020-08-10 LAB — GLUCOSE, CAPILLARY
Glucose-Capillary: 91 mg/dL (ref 70–99)
Glucose-Capillary: 91 mg/dL (ref 70–99)
Glucose-Capillary: 123 mg/dL — ABNORMAL HIGH (ref 70–99)
Glucose-Capillary: 115 mg/dL — ABNORMAL HIGH (ref 70–99)

## 2020-08-10 LAB — COMPREHENSIVE METABOLIC PANEL
ALT: 20 U/L (ref 0–44)
AST: 19 U/L (ref 15–41)
Albumin: 3 g/dL — ABNORMAL LOW (ref 3.5–5.0)
Alkaline Phosphatase: 39 U/L (ref 38–126)
Anion gap: 11 (ref 5–15)
BUN: 10 mg/dL (ref 8–23)
CO2: 30 mmol/L (ref 22–32)
Calcium: 8.7 mg/dL — ABNORMAL LOW (ref 8.9–10.3)
Chloride: 92 mmol/L — ABNORMAL LOW (ref 98–111)
Creatinine, Ser: 0.43 mg/dL — ABNORMAL LOW (ref 0.44–1.00)
GFR, Estimated: >60 mL/min (ref >60)
Glucose, Bld: 81 mg/dL (ref 70–99)
Potassium: 3.1 mmol/L — ABNORMAL LOW (ref 3.5–5.1)
Sodium: 133 mmol/L — ABNORMAL LOW (ref 135–145)
Total Bilirubin: 0.5 mg/dL (ref 0.3–1.2)
Total Protein: 5.9 g/dL — ABNORMAL LOW (ref 6.5–8.1)

## 2020-08-10 LAB — BRAIN NATRIURETIC PEPTIDE: B Natriuretic Peptide: 43.8 pg/mL (ref 0.0–100.0)

## 2020-08-10 LAB — MAGNESIUM: Magnesium: 1.8 mg/dL (ref 1.7–2.4)

## 2020-08-10 LAB — C-REACTIVE PROTEIN: CRP: 1 mg/dL — ABNORMAL HIGH (ref <1.0)

## 2020-08-10 MED ORDER — ALBUTEROL SULFATE HFA 108 (90 BASE) MCG/ACT IN AERS: 2.0000 | INHALATION_SPRAY | Freq: Four times a day (QID) | RESPIRATORY_TRACT | Status: DC | PRN

## 2020-08-10 MED ORDER — ALBUTEROL SULFATE HFA 108 (90 BASE) MCG/ACT IN AERS
2.0000 | INHALATION_SPRAY | Freq: Three times a day (TID) | RESPIRATORY_TRACT | Status: DC
  Administered 2020-08-10 – 2020-08-15 (×18): 2 via RESPIRATORY_TRACT

## 2020-08-10 MED ORDER — POTASSIUM CHLORIDE CRYS ER 20 MEQ PO TBCR
40.0000 meq | EXTENDED_RELEASE_TABLET | ORAL | Status: AC
  Administered 2020-08-10 (×2): 40 meq via ORAL
  Filled 2020-08-10 (×2): qty 2

## 2020-08-10 NOTE — TOC Progression Note (Signed)
Transition of Care [facility]) - Progression Note    Patient Details  Name: Jaime Kelley MRN: [id number] Date of Birth: Oct 03, 1951  Transition of Care [facility]) CM/SW [place], LCSW Phone Number: 08/10/2020, 11:31 AM  Clinical Narrative:    Patient's daughter returned call. CSW asked her who would sign patient in at SNF and as she reported to the previous CSW, she reports that patient does not have anyone willing to sign patient in due to the SNF admission paperwork stating that they would be financially responsible for patient. She stated that [facility] was supposed to be pursuing Guardianship with the [facility] and applying for Medicaid for long term placement. CSW explained that the only facility option at this time will likely be the [facility] in [place]. She reported that would not be a good facility for patient because it is far from her friends. CSW explained she will have to go to the facility that can accept her. However, if there is no one to sign patient into SNF, Guardianship will have to be pursued through APS. CSW confirmed with [facility] that they did not pursue Guardianship and allowed patient to sign herself in since she was aware of how to do the paperwork. CSW will follow up.     Expected Discharge Plan: Skilled Nursing Facility Barriers to Discharge: Family Issues, Unsafe home situation, SNF Pending bed offer  Expected Discharge Plan and Services Expected Discharge Plan: [place] In-house Referral: Clinical Social Work   Post Acute Care Choice: [place] Living arrangements for the past 2 months: [place], [place]                                       Social Determinants of Health (SDOH) Interventions    Readmission Risk Interventions Readmission Risk Prevention Plan 08/10/2020 07/27/2020  Transportation Screening Complete Complete  PCP or Specialist Appt within 3-5 Days - Complete  HRI or  [place] - Complete  Social Work Consult for [place] Planning/Counseling - Complete  Palliative Care Screening - Not Applicable  Medication Review Press photographer) Referral to Pharmacy Complete  PCP or Specialist appointment within 3-5 days of discharge Complete -  [place] or Home Care Consult Complete -  [facility] Recovery Care/Counseling Consult Complete -  Palliative Care Screening Not Applicable -  [place] Complete -  Some recent data might be hidden

## 2020-08-10 NOTE — Telephone Encounter (Signed)
Patient currently admitted to the hospital -pr

## 2020-08-10 NOTE — Social Work (Signed)
CSW reached out to the daughter [name] of the pt at the direction of MD. [name] was upset because she felt as though her mother should not be dc after this current hospital stay. CSW spoke with [name] about whether or not she filed an APS report, she stated she had spoken with an attorney who advised her that she didn't need to file a report. CSW advised that since [name] had seen the house issues she would need to call a report in. CSW explained that she had already made a report in reference to pt's living conditions but it would be necessary for her to also make a report if she was concerned about her mom returning home. [name] stated that she didn't want to apply for guardianship as she was unaware of the financial ramifications that could come from it. [name] also stated no one else in her family would step up and do it either. [name] was unclear about whether or not she signed the original admissions paperwork at [facility]. [name] stated that if her mom is able to make her own decisions and return back home, PTAR would have to leave her on the doorstep and she would die as there is no one at the home to take care of her and PTAR wouldn't be able to get in the home as it is so cluttered and unsafe. CSW again reminded [name] that she had first hand knowledge of the home and it was important that she file a report. Christine spoke at length about how the hospital dc pt was a liability. CSW explained that the hospital will dc a pt when they are medically stable. [name] stated she would not be signing for her mother. CSW called and filed another APS report on 08/10/20 for possible abandonment.

## 2020-08-10 NOTE — NC FL2 (Signed)
[place] LEVEL OF CARE SCREENING TOOL     IDENTIFICATION  Patient Name: Jaime Kelley Birthdate: [birth year] Sex: female Admission Date (Current Location): 08/06/2020  [facility] and [place] Number:  Herbalist and Address:  [facility], [place] [address], [place], [place] [address]      Provider Number: [id number]  Attending Physician Name and Address:  Albertine Patricia, MD  Relative Name and Phone Number:  Bevelyn Ngo [phone]    Current Level of Care: Hospital Recommended Level of Care: [place] Prior Approval Number:    Date Approved/Denied:   PASRR Number: [id number] Expires 08/19/20  Discharge Plan: SNF    Current Diagnoses: Patient Active Problem List   Diagnosis Date Noted  . Adjustment disorder with anxious mood 08/07/2020  . Pneumonia due to COVID-19 virus   . Stage II pressure ulcer of right buttock ([place]) 07/12/2020  . Obesity 07/12/2020  . Mucus plugging of bronchi   . Pleural effusion on left 07/10/2020  . Collapsed lung 07/10/2020  . Microcytic anemia 07/10/2020  . Chest pain 07/10/2020  . Thrombocytosis 07/10/2020  . Generalized weakness 07/10/2020  . Chronic pain of both knees 02/25/2019  . Upper airway cough syndrome 12/22/2017  . Hyponatremia 02/13/2017  . Acute hypoxemic respiratory failure ([place]) 01/18/2017  . Type 2 diabetes mellitus ([place]) 05/25/2016  . Encounter for therapeutic drug monitoring 11/20/2013  . Anxiety state, unspecified 09/16/2013  . Long term (current) use of anticoagulants 09/11/2013  . Anticoagulated on Coumadin 06/28/2013  . Depression 05/26/2013  . Hx pulmonary embolism 05/19/2013  . Tobacco abuse 05/19/2013  . Hypertension 05/13/2013  . 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 BACK STRAIN, LUMBAR 07/09/2009  . Rheumatoid arthritis ([place]) 02/10/2009  . THUMB SPRAIN 06/26/2008  . COPD (chronic  obstructive pulmonary disease) ([place]) 10/31/2007  . BRONCHITIS, ACUTE 06/17/2007  . EMBOLISM/THROMBOSIS, DEEP VSL PRXML LWR EXTRM 05/31/2007  . Hypothyroidism 04/15/2007  . EMPHYSEMA 04/15/2007  . GERD 04/15/2007  . LUPUS ERYTHEMATOSUS, DISCOID 04/15/2007  . PULMONARY EMBOLISM, HX OF 04/15/2007  . Personal history of colonic polyps 04/15/2007  . DIVERTICULITIS, HX OF 04/15/2007    Orientation RESPIRATION BLADDER Height & Weight     Self, Time, Situation, Place (but lacks capacity to make her own decisions)  O2 (Nasal cannula 3L) Incontinent, External catheter Weight: 210 lb (95.3 kg) Height:  5' (152.4 cm)  BEHAVIORAL SYMPTOMS/MOOD NEUROLOGICAL BOWEL NUTRITION STATUS      Incontinent Diet (Please see DC Summary)  AMBULATORY STATUS COMMUNICATION OF NEEDS Skin   Extensive Assist Verbally PU Stage and Appropriate Care (Pressure injury 07/10/20 buttocks left stage 2)                       Personal Care Assistance Level of Assistance  Bathing, Feeding, Dressing Bathing Assistance: Maximum assistance Feeding assistance: Independent Dressing Assistance: Limited assistance     Functional Limitations Info  Sight, Hearing, Speech Sight Info: Impaired Hearing Info: Impaired Speech Info: Adequate    SPECIAL CARE FACTORS FREQUENCY  PT (By licensed PT), OT (By licensed OT)     PT Frequency: 5x/week OT Frequency: 5x/week            Contractures Contractures Info: Not present    Additional Factors Info  Code Status, Allergies, Psychotropic, Insulin Sliding Scale, Isolation Precautions Code Status Info: Full Allergies Info: Effexor ( Venlafaxine Hydrochloride) Psychotropic Info: Zoloft Insulin Sliding Scale  Info: See dc summary for dose Isolation Precautions Info: COVID+     Current Medications (08/10/2020):  This is the current hospital active medication list Current Facility-Administered Medications  Medication Dose Route Frequency Provider Last Rate Last Admin  . 0.9  %  sodium chloride infusion   Intravenous PRN Pahwani, Rinka R, MD      . acetaminophen (TYLENOL) tablet 650 mg  650 mg Oral Q6H PRN Pahwani, Rinka R, MD      . aerochamber plus with mask device 1 each  1 each Other Once Ford, Kelsey N, PA-C      . albuterol (VENTOLIN HFA) 108 (90 Base) MCG/ACT inhaler 2 puff  2 puff Inhalation TID Thurnell Lose, MD   2 puff at 08/10/20 0832  . albuterol (VENTOLIN HFA) 108 (90 Base) MCG/ACT inhaler 2 puff  2 puff Inhalation Q6H PRN Thurnell Lose, MD      . ALPRAZolam Duanne Moron) tablet 1 mg  1 mg Oral Daily PRN Patrecia Pour, NP      . amLODipine (NORVASC) tablet 10 mg  10 mg Oral Daily Pahwani, Rinka R, MD   10 mg at 08/10/20 0834  . apixaban (ELIQUIS) tablet 10 mg  10 mg Oral BID Thurnell Lose, MD   10 mg at 08/10/20 0737   Followed by  . [START ON 08/14/2020] apixaban (ELIQUIS) tablet 5 mg  5 mg Oral BID Thurnell Lose, MD      . ascorbic acid (VITAMIN C) tablet 500 mg  500 mg Oral Daily Pahwani, Rinka R, MD   500 mg at 08/10/20 0834  . chlorpheniramine-HYDROcodone (TUSSIONEX) 10-8 MG/5ML suspension 5 mL  5 mL Oral Q12H PRN Pahwani, Rinka R, MD      . guaiFENesin-dextromethorphan (ROBITUSSIN DM) 100-10 MG/5ML syrup 10 mL  10 mL Oral Q4H PRN Pahwani, Rinka R, MD      . insulin aspart (novoLOG) injection 0-15 Units  0-15 Units Subcutaneous TID WC Thurnell Lose, MD   3 Units at 08/09/20 1635  . insulin aspart (novoLOG) injection 0-5 Units  0-5 Units Subcutaneous QHS Lala Lund K, MD      . insulin glargine (LANTUS) injection 5 Units  5 Units Subcutaneous Daily Thurnell Lose, MD   5 Units at 08/10/20 [phone]  . levothyroxine (SYNTHROID) tablet 137 mcg  137 mcg Oral QAC breakfast Pahwani, Rinka R, MD   137 mcg at 08/10/20 0612  . losartan (COZAAR) tablet 100 mg  100 mg Oral Daily Pahwani, Rinka R, MD   100 mg at 08/10/20 0834  . methylPREDNISolone sodium succinate (SOLU-MEDROL) 40 mg/mL injection 40 mg  40 mg Intravenous Daily Thurnell Lose, MD   40 mg at 08/10/20 0833  . ondansetron (ZOFRAN) injection 4 mg  4 mg Intravenous Q6H PRN Pahwani, Rinka R, MD      . pantoprazole (PROTONIX) EC tablet 40 mg  40 mg Oral Daily Thurnell Lose, MD   40 mg at 08/10/20 0833  . potassium chloride SA (KLOR-CON) CR tablet 40 mEq  40 mEq Oral Q4H [place]y, Silver Huguenin, MD   40 mEq at 08/10/20 0834  . sertraline (ZOLOFT) tablet 100 mg  100 mg Oral Daily Pahwani, Rinka R, MD   100 mg at 08/10/20 0834  . umeclidinium bromide (INCRUSE ELLIPTA) 62.5 MCG/INH 1 puff  1 puff Inhalation Daily Pahwani, Rinka R, MD   1 puff at 08/10/20 0835  . zinc sulfate capsule 220 mg  220 mg Oral Daily  Mckinley Jewel, MD   220 mg at 08/10/20 [id number]     Discharge Medications: Please see discharge summary for a list of discharge medications.  Relevant Imaging Results:  Relevant Lab Results:   Additional Information SSN [id number]  Benard Halsted, LCSW

## 2020-08-10 NOTE — Progress Notes (Signed)
CSW left voicemail for patient's daughter, [name].   Daffney Greenly LCSW

## 2020-08-10 NOTE — Progress Notes (Addendum)
PROGRESS NOTE                                                                                                                                                                                                             Patient Demographics:    Jaime Kelley, is a 69 y.o. female, DOB - 1951-06-21, HLK:[id number]  Admit date - 08/06/2020   Admitting Physician Mckinley Jewel, MD  Outpatient Primary MD for the patient is Laurey Morale, MD  LOS - 4  Chief Complaint  Patient presents with  . Shortness of Breath    COVID +       Brief Narrative (HPI from H&P)  - Jaime Kelley is a 69 y.o. female with medical history significant of hypertension, type 2 diabetes mellitus, morbid obesity, depression/anxiety, chronic hypoxemic respiratory failure secondary to underlying COPD-on 5 L of oxygen at baseline, RA, hypothyroidism, history of PE/DVT-on Coumadin, GERD presents to emergency department for evaluation of hypoxia.  She was recently admitted on 9/18 with complaints of substernal chest pressure and was found to have large left-sided pleural effusion with complete collapse of left lung requiring nonrebreather initially.  Patient underwent left pleural space drainage and chest tube placement which was removed on 9/23.  Bronchoscopy was performed on 9/20 with removal of solitary mucous plug.  Repeat chest x-ray showed recurrent opacification.  Pulmonology performed a repeat thoracentesis at bedside on 07/24/2020.  Chest x-ray on 10/5 demonstrated a small amount of reaccumulation of pleural effusion however oxygen requirements improved to 3 L therefore she discharged to SNF in a stable condition and received [place] Covid vaccine on 10/5.  Work-up here suggestive of acute PE plus minus possible mild Covid pneumonia.    Subjective:   Patient in bed, appears comfortable, denies headache, fever, chest pain .   Assessment  & Plan :    Acute on chronic hypoxic respiratory  failure -Multifactorial, in the setting of baseline COPD. -currently with acute PE.  -she was recently admitted with large left-sided pleural effusion requiring thoracentesis and bronchoscopy for removal of solitary mucous plug.  She also recently received Covid vaccine on 07/27/2020.  She is now here with shortness of breath with minimal left-sided pleural effusion and atelectasis.  Her present acute on chronic hypoxia could be due to mild bronchitis + PE , doubt she has true COVID-19 pneumonia, for now since started will finish steroids(taper) and Remdesivir, she is also chronically on Coumadin with subtherapeutic INR and now on  Lovenox >> Eliquis.  Continue supplemental oxygen note she is on 5 L of nasal cannula oxygen at home.     Recent Labs  Lab 08/06/20 1305 08/06/20 1435 08/06/20 1456 08/06/20 1503 08/06/20 1509 08/06/20 1511 08/07/20 0248 08/08/20 0403 08/09/20 0300 08/10/20 0459  WBC  --   --   --   --   --  3.9* 2.2* 2.4*  --  3.4*  HGB  --   --  11.6*  --   --  9.8* 10.4* 9.2*  --  9.7*  HCT  --   --  34.0*  --   --  34.4* 34.8* 31.8*  --  33.3*  PLT  --   --   --   --   --  245 262 251  --  246  CRP 5.5*  --   --   --   --   --  6.6* 3.0* 1.6* 1.0*  BNP  --   --   --  23.3  --   --  38.8 29.6  --  43.8  DDIMER 5.89*  --   --   --   --   --  5.09*  --   --   --   PROCALCITON <0.10  --   --   --   --   --   --   --   --   --   AST 21  --   --   --   --   --  18 19 19 19   ALT 13  --   --   --   --   --  15 14 16 20   ALKPHOS 52  --   --   --   --   --  46 42 43 39  BILITOT 0.5  --   --   --   --   --  0.3 0.5 0.4 0.5  ALBUMIN 3.0*  --   --   --   --   --  2.9* 2.9* 2.9* 3.0*  INR  --   --   --   --  1.2  --  1.2 1.9* 1.8*  --   LATICACIDVEN 0.9 1.1  --   --   --   --   --   --   --   --   SARSCOV2NAA POSITIVE*  --   --   --   --   --   --   --   --   --      Underlying COPD with 5 L nasal cannula oxygen at home.   -Supportive care.  Continue IV steroids.  Currently  with no wheezing  Social/ethics -Patient reports she wants to go home, did not want placement, as well she threatened to leave AMA, though she is awake alert x3 intermittently, she is with some episodes of confusion and delusions intermittently, psychiatry were consulted during previous admission, and this admission, for which she has deemed not to have capacity to make her own medical decisions, and placement decisions as well. -Social worker has been consulted, trying to assist with SNF placement. -. Stable TSH, B12 and RPR.  Acute PE with history of DVT. -  Was on Coumadin with subtherapeutic INR, has a new PE, placed on Lovenox >> Eliquis on 08/09/2020.  Anxiety and depression  -on Zoloft and Xanax.  Continue.  Hypothyroidism.  On Synthroid.  Essential hypertension.  On Norvasc and ARB.  Morbid obesity.  BMI 41.  Follow with PCP.   Hypokalemia -With potassium of 3.1 this morning, repleted, recheck in a.m.  DM type II.  Low-dose Lantus and sliding scale and monitor.  Lab Results  Component Value Date   HGBA1C 4.7 (L) 08/07/2020   CBG (last 3)  Recent Labs    08/09/20 1558 08/09/20 2103 08/10/20 0726  GLUCAP 199* 122* 91      Condition - Extremely Guarded  Family Communication  :    Daughter [phone] - 11/08/19  Son Simon Rhein - [phone] -11/08/2019, extremely argumentative and rude and slammed the phone down.  Would not let me finish my conversation.  Patient states that son has no right to interfere with her medical decision making.  Code Status :  Full  Consults  : Psych  Procedures  :    PUD Prophylaxis : PPI  Disposition Plan  :    Status is: Inpatient  Remains inpatient appropriate because:IV treatments appropriate due to intensity of illness or inability to take PO   Dispo: The patient is from: Home              Anticipated d/c is to: Home              Anticipated d/c date is: > 3 days              Patient currently is not medically stable  to d/c.   DVT Prophylaxis  :  Lovenox >> Coumadin  Lab Results  Component Value Date   INR 1.8 (H) 08/09/2020   INR 1.9 (H) 08/08/2020   INR 1.2 08/07/2020     Lab Results  Component Value Date   PLT 246 08/10/2020    Diet :  Diet Order            Diet heart healthy/carb modified Room service appropriate? Yes; Fluid consistency: Thin  Diet effective now                  Inpatient Medications Scheduled Meds: . aerochamber plus with mask  1 each Other Once  . albuterol  2 puff Inhalation TID  . amLODipine  10 mg Oral Daily  . apixaban  10 mg Oral BID   Followed by  . [START ON 08/14/2020] apixaban  5 mg Oral BID  . vitamin C  500 mg Oral Daily  . insulin aspart  0-15 Units Subcutaneous TID WC  . insulin aspart  0-5 Units Subcutaneous QHS  . insulin glargine  5 Units Subcutaneous Daily  . levothyroxine  137 mcg Oral QAC breakfast  . losartan  100 mg Oral Daily  . methylPREDNISolone (SOLU-MEDROL) injection  40 mg Intravenous Daily  . pantoprazole  40 mg Oral Daily  . potassium chloride  40 mEq Oral Q4H  . sertraline  100 mg Oral Daily  . umeclidinium bromide  1 puff Inhalation Daily  . zinc sulfate  220 mg Oral Daily   Continuous Infusions: . sodium chloride     PRN Meds:.sodium chloride, acetaminophen, albuterol, ALPRAZolam, chlorpheniramine-HYDROcodone, guaiFENesin-dextromethorphan, [DISCONTINUED] ondansetron **OR** ondansetron (ZOFRAN) IV  Antibiotics  :   Anti-infectives (From admission, onward)   Start     Dose/Rate Route Frequency Ordered Stop   08/07/20 1000  remdesivir 100 mg in sodium chloride 0.9 % 100 mL IVPB       "Followed by" Linked Group Details   100 mg 200 mL/hr over 30 Minutes Intravenous Daily 08/06/20 1449 08/10/20 0913   08/07/20 1000  remdesivir 100  mg in sodium chloride 0.9 % 100 mL IVPB  Status:  Discontinued       "Followed by" Linked Group Details   100 mg 200 mL/hr over 30 Minutes Intravenous Daily 08/06/20 1503 08/06/20 1506    08/06/20 1700  remdesivir 200 mg in sodium chloride 0.9% 250 mL IVPB       "Followed by" Linked Group Details   200 mg 580 mL/hr over 30 Minutes Intravenous Once 08/06/20 1449 08/07/20 0345   08/06/20 1515  remdesivir 200 mg in sodium chloride 0.9% 250 mL IVPB  Status:  Discontinued       "Followed by" Linked Group Details   200 mg 580 mL/hr over 30 Minutes Intravenous Once 08/06/20 1503 08/06/20 1506          Objective:   Vitals:   08/10/20 0000 08/10/20 0400 08/10/20 0723 08/10/20 0833  BP: 137/65 (!) 152/98 135/75 138/73  Pulse: 74 78 83   Resp: 20 15 18    Temp: 97.9 F (36.6 C) 97.9 F (36.6 C) 97.8 F (36.6 C)   TempSrc: Oral Oral Oral   SpO2: 90% 93% 92%   Weight:      Height:        SpO2: 92 % O2 Flow Rate (L/min): 3 L/min  Wt Readings from Last 3 Encounters:  08/06/20 95.3 kg  07/27/20 81.6 kg  10/24/18 103.1 kg     Intake/Output Summary (Last 24 hours) at 08/10/2020 1042 Last data filed at 08/10/2020 0410 Gross per 24 hour  Intake 220 ml  Output 250 ml  Net -30 ml     Physical Exam  Awake Alert, in no apparent distress Symmetrical Chest wall movement, Good air movement bilaterally, CTAB RRR,No Gallops,Rubs or new Murmurs, No Parasternal Heave +ve B.Sounds, Abd Soft, No tenderness, No rebound - guarding or rigidity. No Cyanosis, Clubbing or edema, No new Rash or bruise      Pressure Injury 07/10/20 Buttocks Left Stage 2 -  Partial thickness loss of dermis presenting as a shallow open injury with a red, pink wound bed without slough. (Active)  07/10/20 2330  Location: Buttocks  Location Orientation: Left  Staging: Stage 2 -  Partial thickness loss of dermis presenting as a shallow open injury with a red, pink wound bed without slough.  Wound Description (Comments):   Present on Admission: Yes     Pressure Injury 07/11/20 Buttocks Left Stage 2 -  Partial thickness loss of dermis presenting as a shallow open injury with a red, pink wound bed  without slough. (Active)  07/11/20 2330  Location: Buttocks  Location Orientation: Left  Staging: Stage 2 -  Partial thickness loss of dermis presenting as a shallow open injury with a red, pink wound bed without slough.  Wound Description (Comments):   Present on Admission: Yes     Data Review:   Recent Labs  Lab 08/06/20 1456 08/06/20 1511 08/07/20 0248 08/08/20 0403 08/10/20 0459  WBC  --  3.9* 2.2* 2.4* 3.4*  HGB 11.6* 9.8* 10.4* 9.2* 9.7*  HCT 34.0* 34.4* 34.8* 31.8* 33.3*  PLT  --  245 262 251 246  MCV  --  78.5* 77.9* 77.0* 78.0*  MCH  --  22.4* 23.3* 22.3* 22.7*  MCHC  --  28.5* 29.9* 28.9* 29.1*  RDW  --  Not Measured Not Measured Not Measured Not Measured  LYMPHSABS  --  1.1 0.7 1.1 1.9  MONOABS  --  0.4 0.1 0.3 0.4  EOSABS  --  0.0  0.0 0.0 0.0  BASOSABS  --  0.0 0.0 0.0 0.0    Recent Labs  Lab 08/06/20 1305 08/06/20 1305 08/06/20 1435 08/06/20 1456 08/06/20 1503 08/06/20 1509 08/07/20 0248 08/07/20 0814 08/08/20 0403 08/08/20 1031 08/09/20 0300 08/10/20 0459  NA 128*   < >  --  128*  --   --  131*  --  133*  --  134* 133*  K 3.9   < >  --  3.8  --   --  3.9  --  4.0  --  3.9 3.1*  CL 89*  --   --   --   --   --  92*  --  92*  --  94* 92*  CO2 28  --   --   --   --   --  30  --  33*  --  31 30  GLUCOSE 89  --   --   --   --   --  127*  --  101*  --  95 81  BUN 7*  --   --   --   --   --  6*  --  10  --  9 10  CREATININE 0.47  --   --   --   --   --  0.50  --  0.52  --  0.46 0.43*  CALCIUM 8.4*  --   --   --   --   --  8.7*  --  8.5*  --  8.6* 8.7*  AST 21  --   --   --   --   --  18  --  19  --  19 19  ALT 13  --   --   --   --   --  15  --  14  --  16 20  ALKPHOS 52  --   --   --   --   --  46  --  42  --  43 39  BILITOT 0.5  --   --   --   --   --  0.3  --  0.5  --  0.4 0.5  ALBUMIN 3.0*  --   --   --   --   --  2.9*  --  2.9*  --  2.9* 3.0*  MG  --   --   --   --   --   --  1.9  --  1.9  --  1.9 1.8  CRP 5.5*  --   --   --   --   --  6.6*  --   3.0*  --  1.6* 1.0*  DDIMER 5.89*  --   --   --   --   --  5.09*  --   --   --   --   --   PROCALCITON <0.10  --   --   --   --   --   --   --   --   --   --   --   LATICACIDVEN 0.9  --  1.1  --   --   --   --   --   --   --   --   --   INR  --   --   --   --   --  1.2 1.2  --  1.9*  --  1.8*  --   TSH  --   --   --   --   --   --   --   --   --  1.275  --   --   HGBA1C  --   --   --   --   --   --   --  4.7*  --   --   --   --   BNP  --   --   --   --  23.3  --  38.8  --  29.6  --   --  43.8   < > = values in this interval not displayed.    Recent Labs  Lab 08/06/20 1305 08/06/20 1503 08/07/20 0248 08/08/20 0403 08/09/20 0300 08/10/20 0459  CRP 5.5*  --  6.6* 3.0* 1.6* 1.0*  DDIMER 5.89*  --  5.09*  --   --   --   BNP  --  23.3 38.8 29.6  --  43.8  PROCALCITON <0.10  --   --   --   --   --   SARSCOV2NAA POSITIVE*  --   --   --   --   --     ------------------------------------------------------------------------------------------------------------------ No results for input(s): CHOL, HDL, LDLCALC, TRIG, CHOLHDL, LDLDIRECT in the last 72 hours.  Lab Results  Component Value Date   HGBA1C 4.7 (L) 08/07/2020   ------------------------------------------------------------------------------------------------------------------ Recent Labs    08/08/20 1031  TSH 1.275   ------------------------------------------------------------------------------------------------------------------ Recent Labs    08/08/20 1031  VITAMINB12 363    Coagulation profile Recent Labs  Lab 08/06/20 1509 08/07/20 0248 08/08/20 0403 08/09/20 0300  INR 1.2 1.2 1.9* 1.8*    No results for input(s): DDIMER in the last 72 hours.  Cardiac Enzymes No results for input(s): CKMB, TROPONINI, MYOGLOBIN in the last 168 hours.  Invalid input(s): CK ------------------------------------------------------------------------------------------------------------------    Component Value Date/Time   BNP  43.8 08/10/2020 0459    Micro Results Recent Results (from the past 240 hour(s))  Blood Culture (routine x 2)     Status: None (Preliminary result)   Collection Time: 08/06/20  1:00 PM   Specimen: BLOOD  Result Value Ref Range Status   Specimen Description BLOOD RIGHT ANTECUBITAL  Final   Special Requests   Final    BOTTLES DRAWN AEROBIC AND ANAEROBIC Blood Culture results may not be optimal due to an inadequate volume of blood received in culture bottles   Culture   Final    NO GROWTH 4 DAYS Performed at [facility], [place] [address], [place], [place] [address]    Report Status PENDING  Incomplete  Respiratory Panel by RT PCR (Flu A&B, Covid) - Nasopharyngeal Swab     Status: Abnormal   Collection Time: 08/06/20  1:05 PM   Specimen: Nasopharyngeal Swab  Result Value Ref Range Status   SARS Coronavirus 2 by RT PCR POSITIVE (A) NEGATIVE Final    Comment: emailed L. Berdik RN 14:40 08/06/20 ([email or web address]) (NOTE) SARS-CoV-2 target nucleic acids are DETECTED.  SARS-CoV-2 RNA is generally detectable in upper respiratory specimens  during the acute phase of infection. Positive results are indicative of the presence of the identified virus, but do not rule out bacterial infection or co-infection with other pathogens not detected by the test. Clinical correlation with patient history and other diagnostic information is necessary to determine patient infection status. The expected result is Negative.  Fact Sheet for Patients:  PinkCheek.be  Fact Sheet for Healthcare Providers: GravelBags.it  This test is not yet approved or cleared by the [place] FDA and  has been authorized for detection and/or diagnosis of SARS-CoV-2 by FDA under an  Emergency Use Authorization (EUA).  This EUA will remain in effect (meaning this test can be used) for the duration of  the COVID-19  declaration under Section 564(b)(1) of the Act,  21 U.S.C. section 360bbb-3(b)(1), unless the authorization is terminated or revoked sooner.      Influenza A by PCR NEGATIVE NEGATIVE Final   Influenza B by PCR NEGATIVE NEGATIVE Final    Comment: (NOTE) The Xpert Xpress SARS-CoV-2/FLU/RSV assay is intended as an aid in  the diagnosis of influenza from Nasopharyngeal swab specimens and  should not be used as a sole basis for treatment. Nasal washings and  aspirates are unacceptable for Xpert Xpress SARS-CoV-2/FLU/RSV  testing.  Fact Sheet for Patients: PinkCheek.be  Fact Sheet for Healthcare Providers: GravelBags.it  This test is not yet approved or cleared by the [place] FDA and  has been authorized for detection and/or diagnosis of SARS-CoV-2 by  FDA under an Emergency Use Authorization (EUA). This EUA will remain  in effect (meaning this test can be used) for the duration of the  Covid-19 declaration under Section 564(b)(1) of the Act, 21  U.S.C. section 360bbb-3(b)(1), unless the authorization is  terminated or revoked. Performed at [facility], [place] [address] [place], [place] [address]   Blood Culture (routine x 2)     Status: None (Preliminary result)   Collection Time: 08/06/20  1:22 PM   Specimen: BLOOD RIGHT WRIST  Result Value Ref Range Status   Specimen Description BLOOD RIGHT WRIST  Final   Special Requests   Final    BOTTLES DRAWN AEROBIC AND ANAEROBIC Blood Culture results may not be optimal due to an inadequate volume of blood received in culture bottles   Culture   Final    NO GROWTH 4 DAYS Performed at [facility], [place] [address], [place], [place] [address]    Report Status PENDING  Incomplete  MRSA PCR Screening     Status: None   Collection Time: 08/07/20  6:23 AM   Specimen: Nasal Mucosa; Nasopharyngeal  Result Value Ref Range Status   MRSA by PCR NEGATIVE NEGATIVE Final    Comment:        The GeneXpert MRSA Assay  (FDA approved for NASAL specimens only), is one component of a comprehensive MRSA colonization surveillance program. It is not intended to diagnose MRSA infection nor to guide or monitor treatment for MRSA infections. Performed at [facility], [place] [address] [place],  [address]     Radiology Reports DG Chest 2 View  Result Date: 07/26/2020 CLINICAL DATA:  Left pleural effusion EXAM: CHEST - 2 VIEW COMPARISON:  07/24/2020 FINDINGS: Small left pleural effusion is increased. Left basilar atelectasis is present. No pneumothorax. Background changes of emphysema. Stable cardiomediastinal contours. IMPRESSION: Small left pleural effusion increased since 07/24/2020. Mild left basilar atelectasis. Electronically Signed   By: Macy Mis M.D.   On: 07/26/2020 08:07   CT CHEST WO CONTRAST  Result Date: 07/23/2020 CLINICAL DATA:  Abnormal radiograph, pleural effusion EXAM: CT CHEST WITHOUT CONTRAST TECHNIQUE: Multidetector CT imaging of the chest was performed following the standard protocol without IV contrast. COMPARISON:  Radiograph 07/21/2020, CT 07/10/2020 FINDINGS: Cardiovascular: Cardiac size within normal limits. Small volume pericardial fluid is similar to prior. Dense calcifications along the mitral annulus. Additional calcifications of the aortic leaflets. Hypoattenuation of the cardiac blood pool compatible with anemia. Extensive three-vessel native coronary artery atherosclerosis. Atherosclerotic plaque within the normal caliber aorta. No hyperdense mural thickening or  plaque displacement nor periaortic stranding or hemorrhage. Shared origin of the brachiocephalic and left common carotid arteries. Additional calcifications seen throughout the proximal great vessels. Central pulmonary arteries are normal caliber. Luminal evaluation precluded in the absence of contrast media. No visible worrisome venous abnormalities. Mediastinum/Nodes: Persistent slight leftward mediastinal  shift, similar to comparison. No mediastinal fluid, gas or hemorrhage. Redemonstration of multiple asymmetrically prominent though non pathologically enlarged left axillary and mediastinal lymph nodes. No right axillary adenopathy. Hilar nodal is severely limited in the absence of contrast media. No acute abnormality of the trachea or esophagus. Thyroid gland is diminutive, correlate for prior thyroidectomy or ablation. Lungs/Pleura: Background of moderate centrilobular emphysematous changes throughout both lungs. There is a moderate residual left pleural effusion with adjacent areas of passive atelectasis in the left lung base. Some additional thickening is noted along the left major fissure which may reflect some persistent loculated fluid. Adjacent atelectatic changes are present along the left major fissure as well. Predominantly involving the lingula and posterior segment left upper lobe. Underlying consolidation would be difficult to exclude. Some there is a stable sub solid opacity in the posterosuperior segment right lower lobe (4/74 measuring up to 8 mm. Possibly post infectious or inflammatory. Stable scarring within the anterior basal segment right lower lobe and major fissure. Right lung is otherwise clear. Upper Abdomen: Multiple calcified gallstones within the largely decompressed gallbladder. Scattered colonic diverticula without focal inflammation to suggest diverticulitis. Extensive upper abdominal atherosclerotic calcification. Stable likely parapelvic cyst in the upper pole left kidney. Musculoskeletal: Mild body wall edema, left slightly more pronounced than right. No acute abnormality or worrisome osseous lesions. Levocurvature of the upper thoracic spine with some mild dextrocurvature of the mid to lower levels. Multilevel discogenic and facet degenerative changes are present. Additional degenerative changes of the right shoulder and sternoclavicular joints similar to prior. IMPRESSION: 1.  Moderate residual left pleural effusion with adjacent areas of passive atelectasis in the left lung base. Some additional thickening along the left major fissure may reflect some persistent loculated fluid with some further atelectatic change in the lingula as well. Underlying consolidation within the atelectatic lung would cannot be fully excluded. 2. Stable appearance of an 8 mm ground-glass opacity in the posterosuperior segment right lower lobe. Possibly post infectious or inflammatory. Recommend attention on follow-up imaging. Initial follow-up with CT at 6-12 months is recommended to confirm persistence. If persistent, repeat CT is recommended every 2 years until 5 years of stability has been established. This recommendation follows the consensus statement: Guidelines for Management of Incidental Pulmonary Nodules Detected on CT Images: From the Fleischner Society 2017; Radiology 2017; 284:228-243. 3. Redemonstration of multiple asymmetrically prominent though non pathologically enlarged left axillary and mediastinal lymph nodes, nonspecific though possibly reactive or edematous. 4. Cholelithiasis without evidence of acute cholecystitis. 5. Colonic diverticulosis without evidence of diverticulitis. 6. Aortic Atherosclerosis (ICD10-I70.0) 7. Three-vessel coronary artery calcifications. 8. Emphysema (ICD10-J43.9). Electronically Signed   By: Lovena Le M.D.   On: 07/23/2020 22:26   CT ANGIO CHEST PE W OR WO CONTRAST  Result Date: 08/07/2020 CLINICAL DATA:  PE suspected, established right-sided PE, increased shortness of breath, assess embolus burden EXAM: CT ANGIOGRAPHY CHEST WITH CONTRAST TECHNIQUE: Multidetector CT imaging of the chest was performed using the standard protocol during bolus administration of intravenous contrast. Multiplanar CT image reconstructions and MIPs were obtained to evaluate the vascular anatomy. CONTRAST:  70mL OMNIPAQUE IOHEXOL 350 MG/ML SOLN COMPARISON:  None. FINDINGS:  Cardiovascular: Examination for pulmonary embolism is somewhat  limited by breath motion artifact. Within this limitation, there are occasional probable subsegmental emboli, for example in the dependent right lower lobe (series 6, image 76) and in the dependent left lower lobe (series 6, image 63). There is no segmental or more central emboli identified. Aortic atherosclerosis. Normal heart size. Three-vessel coronary artery calcifications. Enlargement of the main pulmonary artery measuring up to 3.6 cm in caliber. Small pericardial effusion. Mediastinum/Nodes: No enlarged mediastinal, hilar, or axillary lymph nodes. Thyroid gland, trachea, and esophagus demonstrate no significant findings. Lungs/Pleura: Moderate centrilobular emphysema. Moderate left pleural effusion and associated atelectasis or consolidation, similar to prior examination. Upper Abdomen: No acute abnormality. Musculoskeletal: No chest wall abnormality. No acute or significant osseous findings. Review of the MIP images confirms the above findings. IMPRESSION: 1. Examination for pulmonary embolism is somewhat limited by breath motion artifact. Within this limitation, there are occasional probable subsegmental emboli present bilaterally. No segmental or more central embolus identified. 2. Moderate left pleural effusion and associated atelectasis or consolidation, similar to prior examination. 3. Emphysema (ICD10-J43.9). 4. Coronary artery disease. Aortic Atherosclerosis (ICD10-I70.0) 5. Enlargement of the main pulmonary artery measuring up to 3.6 cm in caliber, as can be seen in pulmonary hypertension. Electronically Signed   By: Eddie Candle M.D.   On: 08/07/2020 15:33   DG Chest Port 1 View  Result Date: 08/06/2020 CLINICAL DATA:  COVID-19 positive with shortness of breath EXAM: PORTABLE CHEST 1 VIEW COMPARISON:  July 26, 2020 FINDINGS: There is left lower lobe airspace opacity with small left pleural effusion. The lungs elsewhere are clear.  The heart is upper normal in size with pulmonary vascularity normal. No adenopathy. There is aortic atherosclerosis. No bone lesions. IMPRESSION: Small left pleural effusion. Airspace opacity in the left lower lobe concerning for pneumonia with likely associated atelectasis. Lungs elsewhere clear. Stable cardiac silhouette. Aortic Atherosclerosis (ICD10-I70.0). Electronically Signed   By: Lowella Grip III M.D.   On: 08/06/2020 13:31   DG CHEST PORT 1 VIEW  Result Date: 07/24/2020 CLINICAL DATA:  Status post left thoracentesis EXAM: PORTABLE CHEST 1 VIEW COMPARISON:  07/21/2020 FINDINGS: Trace left pleural effusion, improved status post thoracentesis. No pneumothorax is seen. Right lung is essentially clear. The heart is normal in size.  Thoracic aortic atherosclerosis. IMPRESSION: Trace left pleural effusion, improved status post thoracentesis. No pneumothorax is seen. Electronically Signed   By: Julian Hy M.D.   On: 07/24/2020 13:01   DG Chest Port 1 View  Result Date: 07/21/2020 CLINICAL DATA:  Shortness of breath EXAM: PORTABLE CHEST 1 VIEW COMPARISON:  July 15, 2020 FINDINGS: There is consolidation throughout the left mid and lower lung regions with questionable superimposed left pleural effusion. The right lung is clear. Heart is upper normal in size with pulmonary vascularity normal. No adenopathy. There is aortic atherosclerosis. No bone lesions. IMPRESSION: Consolidation throughout much of the left mid and lower lung regions with suspected small pleural effusions superimposed on the left. Right lung clear. Heart upper normal in size. No adenopathy. Aortic Atherosclerosis (ICD10-I70.0). Electronically Signed   By: Lowella Grip III M.D.   On: 07/21/2020 14:53   DG Chest Port 1 View  Result Date: 07/15/2020 CLINICAL DATA:  Left chest tube EXAM: PORTABLE CHEST 1 VIEW COMPARISON:  07/14/2020 FINDINGS: Slight retraction of the left small bore chest tube. No significant  pneumothorax by plain radiography. Persistent left basilar collapse/consolidation. No enlarging effusion. Stable right lung aeration. Heart remains enlarged. Aorta atherosclerotic.  Degenerative changes noted of the spine. IMPRESSION: Slight retraction  of the left chest tube. No current significant pneumothorax by plain radiography Persistent left lower lung collapse/consolidation. Aortic Atherosclerosis (ICD10-I70.0). Electronically Signed   By: Jerilynn Mages.  Shick M.D.   On: 07/15/2020 08:00   DG Chest Port 1 View  Result Date: 07/14/2020 CLINICAL DATA:  Chest tube present EXAM: PORTABLE CHEST 1 VIEW COMPARISON:  July 14, 2020 study obtained earlier in the day. FINDINGS: Chest tube on the left is unchanged in position. No evident pneumothorax. There is bibasilar atelectatic change, somewhat more on the left than on the right, stable. No new opacity evident. Heart is upper normal in size with pulmonary vascularity normal. No adenopathy. There is aortic atherosclerosis. No bone lesions. There is bilateral carotid artery calcification. IMPRESSION: Chest tube unchanged on the left without evident pneumothorax. Lower lung region atelectasis is stable. No new opacity. Stable cardiac silhouette. Aortic Atherosclerosis (ICD10-I70.0). There is bilateral carotid artery calcification. Electronically Signed   By: Lowella Grip III M.D.   On: 07/14/2020 13:25   DG Chest Port 1 View  Result Date: 07/14/2020 CLINICAL DATA:  Chest tube surveillance EXAM: PORTABLE CHEST 1 VIEW COMPARISON:  07/13/2020 FINDINGS: Left-sided chest tube remains in place. Stable cardiomegaly. Aortic atherosclerosis. Suspect small residual left-sided pleural effusion. Persistent bibasilar atelectasis. No pneumothorax is seen. IMPRESSION: 1. Left-sided chest tube remains in place. No pneumothorax. 2. Persistent bibasilar atelectasis and probable small residual left pleural effusion. Electronically Signed   By: Davina Poke D.O.   On: 07/14/2020  08:18   DG Chest Port 1 View  Result Date: 07/13/2020 CLINICAL DATA:  69 year old female with pleural effusion, chest tube placed. EXAM: PORTABLE CHEST 1 VIEW COMPARISON:  Portable chest 07/12/2020 and earlier. FINDINGS: Portable AP semi upright view at 1222 hours. Left chest tube position has not significantly changed. No pneumothorax. No residual pleural effusion is evident now. Mild patchy residual opacity at the left lung base. Mediastinal contours are within normal limits. Allowing for portable technique the right lung appears clear. Negative trachea. No acute osseous abnormality identified. IMPRESSION: 1. Stable left chest tube. Residual atelectasis but no pneumothorax or residual pleural effusion. 2. No new cardiopulmonary abnormality. Electronically Signed   By: Genevie Ann M.D.   On: 07/13/2020 12:41   DG CHEST PORT 1 VIEW  Result Date: 07/12/2020 CLINICAL DATA:  Left pleural effusion EXAM: PORTABLE CHEST 1 VIEW COMPARISON:  07/11/2020 FINDINGS: Decreasing left pleural effusion with left chest tube in place. Moderate to large left effusion remains with improved aeration in the left lung. Continued left lower lobe atelectasis or infiltrate. No confluent opacity on the right. No pneumothorax. Aortic atherosclerosis. IMPRESSION: Decreasing left effusion. The left effusion remains moderate to large with left lower lobe atelectasis or infiltrate. Left chest tube in place.  No visible pneumothorax. Electronically Signed   By: Rolm Baptise M.D.   On: 07/12/2020 15:56    Phillips Climes M.D on 08/10/2020 at 10:42 AM  To page go to [email or web address]

## 2020-08-10 NOTE — Progress Notes (Signed)
PT Cancellation Note  Patient Details Name: Jaime Kelley MRN: [id number] DOB: 11-26-1950   Cancelled Treatment:    Reason Eval/Treat Not Completed:   Patient eating and states she is going home today and does not need to do therapy. Attempted to get pt to participate "before she leaves" (no indication per chart that she is leaving), however she continued to refuse and wanted to eat.   Our recommendation continues to be SNF for discharge and noted LCSW continues to work on discharge plan.    Arby Barrette, PT Pager [phone]   Jaime Kelley Jaime Kelley 08/10/2020, 4:02 PM

## 2020-08-11 DIAGNOSIS — U071 COVID-19: Secondary | ICD-10-CM | POA: Diagnosis not present

## 2020-08-11 DIAGNOSIS — J439 Emphysema, unspecified: Secondary | ICD-10-CM | POA: Diagnosis not present

## 2020-08-11 DIAGNOSIS — J1282 Pneumonia due to coronavirus disease 2019: Secondary | ICD-10-CM | POA: Diagnosis not present

## 2020-08-11 LAB — COMPREHENSIVE METABOLIC PANEL
ALT: 40 U/L (ref 0–44)
AST: 32 U/L (ref 15–41)
Albumin: 3.2 g/dL — ABNORMAL LOW (ref 3.5–5.0)
Alkaline Phosphatase: 48 U/L (ref 38–126)
Anion gap: 9 (ref 5–15)
BUN: 10 mg/dL (ref 8–23)
CO2: 29 mmol/L (ref 22–32)
Calcium: 8.8 mg/dL — ABNORMAL LOW (ref 8.9–10.3)
Chloride: 95 mmol/L — ABNORMAL LOW (ref 98–111)
Creatinine, Ser: 0.41 mg/dL — ABNORMAL LOW (ref 0.44–1.00)
GFR, Estimated: >60 mL/min (ref >60)
Glucose, Bld: 80 mg/dL (ref 70–99)
Potassium: 3.9 mmol/L (ref 3.5–5.1)
Sodium: 133 mmol/L — ABNORMAL LOW (ref 135–145)
Total Bilirubin: 0.5 mg/dL (ref 0.3–1.2)
Total Protein: 6.1 g/dL — ABNORMAL LOW (ref 6.5–8.1)

## 2020-08-11 LAB — CBC WITH DIFFERENTIAL/PLATELET
Abs Immature Granulocytes: 0.01 10*3/uL (ref 0.00–0.07)
Basophils Absolute: 0 10*3/uL (ref 0.0–0.1)
Basophils Relative: 0 %
Eosinophils Absolute: 0 10*3/uL (ref 0.0–0.5)
Eosinophils Relative: 0 %
HCT: 36.1 % (ref 36.0–46.0)
Hemoglobin: 10.4 g/dL — ABNORMAL LOW (ref 12.0–15.0)
Immature Granulocytes: 0 %
Lymphocytes Relative: 46 %
Lymphs Abs: 2 10*3/uL (ref 0.7–4.0)
MCH: 23 pg — ABNORMAL LOW (ref 26.0–34.0)
MCHC: 28.8 g/dL — ABNORMAL LOW (ref 30.0–36.0)
MCV: 79.7 fL — ABNORMAL LOW (ref 80.0–100.0)
Monocytes Absolute: 0.4 10*3/uL (ref 0.1–1.0)
Monocytes Relative: 9 %
Neutro Abs: 2 10*3/uL (ref 1.7–7.7)
Neutrophils Relative %: 45 %
Platelets: 270 10*3/uL (ref 150–400)
RBC: 4.53 MIL/uL (ref 3.87–5.11)
RDW: 30.1 % — ABNORMAL HIGH (ref 11.5–15.5)
WBC: 4.4 10*3/uL (ref 4.0–10.5)
nRBC: 0 % (ref 0.0–0.2)

## 2020-08-11 LAB — GLUCOSE, CAPILLARY
Glucose-Capillary: 87 mg/dL (ref 70–99)
Glucose-Capillary: 89 mg/dL (ref 70–99)
Glucose-Capillary: 105 mg/dL — ABNORMAL HIGH (ref 70–99)
Glucose-Capillary: 117 mg/dL — ABNORMAL HIGH (ref 70–99)
Glucose-Capillary: 174 mg/dL — ABNORMAL HIGH (ref 70–99)

## 2020-08-11 LAB — CULTURE, BLOOD (ROUTINE X 2)
Culture: NO GROWTH
Culture: NO GROWTH

## 2020-08-11 LAB — C-REACTIVE PROTEIN: CRP: 1.1 mg/dL — ABNORMAL HIGH (ref <1.0)

## 2020-08-11 LAB — MAGNESIUM: Magnesium: 1.9 mg/dL (ref 1.7–2.4)

## 2020-08-11 LAB — BRAIN NATRIURETIC PEPTIDE: B Natriuretic Peptide: 36.8 pg/mL (ref 0.0–100.0)

## 2020-08-11 MED ORDER — PREDNISONE 20 MG PO TABS
20.0000 mg | ORAL_TABLET | Freq: Every day | ORAL | Status: DC
  Administered 2020-08-12: 20 mg via ORAL
  Filled 2020-08-11: qty 1

## 2020-08-11 NOTE — TOC Progression Note (Signed)
Transition of Care [facility]) - Progression Note    Patient Details  Name: Jaime Kelley MRN: [id number] Date of Birth: 08/30/1951  Transition of Care [facility]) CM/SW [place], LCSW Phone Number: 08/11/2020, 3:52 PM  Clinical Narrative:    CSW spoke with [facility] in [place] as requested by [facility]. Per Tiffany in admissions, they are in the process of closing their COVID unit but will review the referral just in case. CSW faxed referral to them (f. [phone]). [facility] no longer accepting patients and [facility] not have long term beds available.    Expected Discharge Plan: Skilled Nursing Facility Barriers to Discharge: Family Issues, Unsafe home situation, SNF Pending bed offer  Expected Discharge Plan and Services Expected Discharge Plan: [place] In-house Referral: Clinical Social Work   Post Acute Care Choice: [place] Living arrangements for the past 2 months: [place], [place]                                       Social Determinants of Health (SDOH) Interventions    Readmission Risk Interventions Readmission Risk Prevention Plan 08/10/2020 07/27/2020  Transportation Screening Complete Complete  PCP or Specialist Appt within 3-5 Days - Complete  HRI or [place] - Complete  Social Work Consult for [place] Planning/Counseling - Complete  Palliative Care Screening - Not Applicable  Medication Review [employer] photographer) Referral to Pharmacy Complete  PCP or Specialist appointment within 3-5 days of discharge Complete -  [place] or Home Care Consult Complete -  SW Recovery Care/Counseling Consult Complete -  Palliative Care Screening Not Applicable -  [place] Complete -  Some recent data might be hidden

## 2020-08-11 NOTE — Progress Notes (Signed)
Physical Therapy Treatment Patient Details Name: Jaime Kelley MRN: [id number] DOB: 07/25/1951 Today's Date: 08/11/2020    History of Present Illness Pt is a 69 y.o. female with recent admission 07/10/20-07/27/20 with large L-side pleural effusion with d/c to SNF, now readmitted from [facility] 08/06/20 with hypoxia; pt had tested (+) COVID-19 on 10/14. Workup for COVID-19 PNA; chronic hypoxemic respiratory failure. PMH includes COPD (5L O2 baseline), RA, PE/DVT on Coumadin, DM2, HTN, obesity, depression/anxiety; pt vaccinated 10/5.    PT Comments    Patient motivated by breakfast/coffee to participate (at least sitting EOB). She required vc however less physical assist than prior sessions for bed mobillity. Sats much improved in upright sitting, however pt adamantly refuses to attempt standing due to left knee pain and "my orthopedist told me not to listen to anything you guys say and to not stand on my left knee." Ultimately returned to chair-like position in bed with sats improved from 86-92% on 3L to 95-97% on 3L.    Follow Up Recommendations  SNF;Supervision/Assistance - 24 hour     Equipment Recommendations   (defer)    Recommendations for Other Services       Precautions / Restrictions Precautions Precautions: Fall;Other (comment) Restrictions Weight Bearing Restrictions: No    Mobility  Bed Mobility Overal bed mobility: Needs Assistance Bed Mobility: Supine to Sit;Sit to Supine;Rolling Rolling: Min assist   Supine to sit: HOB elevated;Mod assist Sit to supine: Min assist   General bed mobility comments: Easily directed to sit EOB for breakfast/coffee. Pt able to scoot to get feet on the floor with min assist. return to bed min assist for LLE up onto bed  Transfers Overall transfer level: Needs assistance   Transfers: Lateral/Scoot Transfers          Lateral/Scoot Transfers: Max assist General transfer comment: pt refuses to attempt standing due to left  knee pain and "orthopedic doctor told her not to listen to anything we said and should not stand on it." Attempted lateral scoot along bed towards pillow with poor effort  Ambulation/Gait             General Gait Details: States she hasnt got OOB in months   Stairs             Wheelchair Mobility    Modified Rankin (Stroke Patients Only)       Balance Overall balance assessment: Needs assistance Sitting-balance support: Feet supported;No upper extremity supported Sitting balance-[name]: Fair Sitting balance - Comments: Initial minA progressing to min guard to supervision--sat EOB ~15 minutes feeding herself breakfast and talking about her past                                    Cognition Arousal/Alertness: Awake/alert Behavior During Therapy: Flat affect Overall Cognitive Status: No family/caregiver present to determine baseline cognitive functioning Area of Impairment: Attention;Memory;Following commands;Safety/judgement;Awareness;Problem solving;Orientation                 Orientation Level: Time;Situation (knew "hospital" but not which one/where) Current Attention Level: Sustained Memory: Decreased short-term memory Following Commands: Follows one step commands inconsistently;Follows one step commands with increased time Safety/Judgement: Decreased awareness of safety;Decreased awareness of deficits Awareness: Intellectual Problem Solving: Slow processing;Decreased initiation;Difficulty sequencing;Requires verbal cues;Requires tactile cues General Comments: pt's breakfast/coffee just arrived and able to convince pt to sit EOB to drink coffee, otherwise refuses mobility      Exercises  Other Exercises Other Exercises: instructed in pursed lip breathing to incr SpO2. Patient able to inhale via nose, however does not purse lips and "blow" out when instructed.     General Comments General comments (skin integrity, edema, etc.): On  arrival: sidelying 3L sats 86-92%; sitting EOB 3L 94-97%;       Pertinent Vitals/Pain Pain Assessment: Faces Faces Pain Scale: Hurts even more Pain Location: L knee Pain Descriptors / Indicators: Discomfort;Grimacing;Guarding;Tender Pain Intervention(s): Limited activity within patient's tolerance;Monitored during session    Home Living                      Prior Function            PT Goals (current goals can now be found in the care plan section) Acute Rehab PT Goals Patient Stated Goal: "I want to get back to everything I was doing before" (including cooking, baking) Time For Goal Achievement: 08/21/20 Potential to Achieve Goals: Poor Progress towards PT goals: Progressing toward goals    Frequency    Min 2X/week      PT Plan Current plan remains appropriate    Co-evaluation              AM-PAC PT "6 Clicks" Mobility   Outcome Measure  Help needed turning from your back to your side while in a flat bed without using bedrails?: A Little Help needed moving from lying on your back to sitting on the side of a flat bed without using bedrails?: A Lot Help needed moving to and from a bed to a chair (including a wheelchair)?: Total Help needed standing up from a chair using your arms (e.g., wheelchair or bedside chair)?: Total Help needed to walk in hospital room?: Total Help needed climbing 3-5 steps with a railing? : Total 6 Click Score: 9    End of Session Equipment Utilized During Treatment: Oxygen Activity Tolerance: Patient limited by pain (left knee worse than rt knee) Patient left: in bed;with call bell/phone within reach;with bed alarm set;Other (comment) (in chair positon with HOB 55) Nurse Communication: Mobility status;Need for lift equipment PT Visit Diagnosis: Muscle weakness (generalized) (M62.81)     Time: 7893-8101 PT Time Calculation (min) (ACUTE ONLY): 40 min  Charges:  $Therapeutic Activity: 23-37 mins $Self Care/Home  Management: 8-22                      Arby Barrette, PT Pager [phone]    Rexanne Mano 08/11/2020, 9:57 AM

## 2020-08-11 NOTE — Progress Notes (Signed)
   08/11/20 1228  Family/Significant Other Communication  Family/Significant Other Update Called (spoke with duaghter christine over the phone)

## 2020-08-11 NOTE — Progress Notes (Signed)
PROGRESS NOTE                                                                                                                                                                                                             Patient Demographics:    Jaime Kelley, is a 69 y.o. female, DOB - Jul 25, 1951, PXT:[id number]  Admit date - 08/06/2020   Admitting Physician Mckinley Jewel, MD  Outpatient Primary MD for the patient is Laurey Morale, MD  LOS - 5  Chief Complaint  Patient presents with  . Shortness of Breath    COVID +       Brief Narrative (HPI from H&P)  - Jaime Kelley is a 69 y.o. female with medical history significant of hypertension, type 2 diabetes mellitus, morbid obesity, depression/anxiety, chronic hypoxemic respiratory failure secondary to underlying COPD-on 5 L of oxygen at baseline, RA, hypothyroidism, history of PE/DVT-on Coumadin, GERD presents to emergency department for evaluation of hypoxia.  She was recently admitted on 9/18 with complaints of substernal chest pressure and was found to have large left-sided pleural effusion with complete collapse of left lung requiring nonrebreather initially.  Patient underwent left pleural space drainage and chest tube placement which was removed on 9/23.  Bronchoscopy was performed on 9/20 with removal of solitary mucous plug.  Repeat chest x-ray showed recurrent opacification.  Pulmonology performed a repeat thoracentesis at bedside on 07/24/2020.  Chest x-ray on 10/5 demonstrated a small amount of reaccumulation of pleural effusion however oxygen requirements improved to 3 L therefore she discharged to SNF in a stable condition and received [place] Covid vaccine on 10/5.  Work-up here suggestive of acute PE plus minus possible mild Covid pneumonia.    Subjective:   Patient in bed, appears comfortable, denies headache, fever, chest pain .   Assessment  & Plan :    Acute on chronic hypoxic respiratory  failure -Multifactorial, in the setting of baseline COPD. -currently with acute PE.  -she was recently admitted with large left-sided pleural effusion requiring thoracentesis and bronchoscopy for removal of solitary mucous plug.  She also recently received Covid vaccine on 07/27/2020.  She is now here with shortness of breath with minimal left-sided pleural effusion and atelectasis.  Her present acute on chronic hypoxia could be due to mild bronchitis + PE , doubt she has true COVID-19 pneumonia, for now since started will finish steroids(taper) and Remdesivir, she is also chronically on Coumadin with subtherapeutic INR and now on  Lovenox >> Eliquis.  Continue supplemental oxygen note she is on 5 L of nasal cannula oxygen at home.     Recent Labs  Lab 08/06/20 1305 08/06/20 1305 08/06/20 1435 08/06/20 1456 08/06/20 1503 08/06/20 1509 08/06/20 1511 08/07/20 0248 08/08/20 0403 08/09/20 0300 08/10/20 0459 08/11/20 0059  WBC  --   --   --   --   --   --  3.9* 2.2* 2.4*  --  3.4* 4.4  HGB  --   --   --    < >  --   --  9.8* 10.4* 9.2*  --  9.7* 10.4*  HCT  --   --   --    < >  --   --  34.4* 34.8* 31.8*  --  33.3* 36.1  PLT  --   --   --   --   --   --  245 262 251  --  246 270  CRP 5.5*   < >  --   --   --   --   --  6.6* 3.0* 1.6* 1.0* 1.1*  BNP  --   --   --   --  23.3  --   --  38.8 29.6  --  43.8 36.8  DDIMER 5.89*  --   --   --   --   --   --  5.09*  --   --   --   --   PROCALCITON <0.10  --   --   --   --   --   --   --   --   --   --   --   AST 21   < >  --   --   --   --   --  18 19 19 19  32  ALT 13   < >  --   --   --   --   --  15 14 16 20  40  ALKPHOS 52   < >  --   --   --   --   --  46 42 43 39 48  BILITOT 0.5   < >  --   --   --   --   --  0.3 0.5 0.4 0.5 0.5  ALBUMIN 3.0*   < >  --   --   --   --   --  2.9* 2.9* 2.9* 3.0* 3.2*  INR  --   --   --   --   --  1.2  --  1.2 1.9* 1.8*  --   --   LATICACIDVEN 0.9  --  1.1  --   --   --   --   --   --   --   --   --     SARSCOV2NAA POSITIVE*  --   --   --   --   --   --   --   --   --   --   --    < > = values in this interval not displayed.     Underlying COPD with 5 L nasal cannula oxygen at home.   -Supportive care.  Continue IV steroids.  Currently with no wheezing  Social/ethics -Patient reports she wants to go home, did not want placement, as well she threatened to leave AMA, though she is awake alert x3 intermittently, she is with some episodes of confusion and  delusions intermittently, psychiatry were consulted during previous admission, and this admission, for which she has deemed not to have capacity to make her own medical decisions, and placement decisions as well. -Social worker has been consulted, trying to assist with SNF placement. -. Stable TSH, B12 and RPR.  Acute PE with history of DVT. -  Was on Coumadin with subtherapeutic INR, has a new PE, placed on Lovenox >> Eliquis on 08/09/2020.  Anxiety and depression  -on Zoloft and Xanax.  Continue.  Hypothyroidism.  On Synthroid.  Essential hypertension.  On Norvasc and ARB.  Morbid obesity.  BMI 41.  Follow with PCP.   Hypokalemia -Repleted  DM type II.  Low-dose Lantus and sliding scale and monitor.  Lab Results  Component Value Date   HGBA1C 4.7 (L) 08/07/2020   CBG (last 3)  Recent Labs    08/11/20 0742 08/11/20 0802 08/11/20 1147  GLUCAP 87 89 105*      Condition - Extremely Guarded  Family Communication  :    Daughter [phone] - 11/08/19 left voicemail 08/11/2020  Son Simon Rhein - [phone] -11/08/2019, extremely argumentative and rude and slammed the phone down.  Would not let me finish my conversation.  Patient states that son has no right to interfere with her medical decision making.  Code Status :  Full  Consults  : Psych  Procedures  :    PUD Prophylaxis : PPI  Disposition Plan  :    Status is: Inpatient  Remains inpatient appropriate because:IV treatments appropriate due to intensity of  illness or inability to take PO   Dispo: The patient is from: Home              Anticipated d/c is to: SNF              Anticipated d/c date is: 1 day              Patient currently is medically stable to d/c.   DVT Prophylaxis  :  Lovenox >> Coumadin  Lab Results  Component Value Date   INR 1.8 (H) 08/09/2020   INR 1.9 (H) 08/08/2020   INR 1.2 08/07/2020     Lab Results  Component Value Date   PLT 270 08/11/2020    Diet :  Diet Order            Diet heart healthy/carb modified Room service appropriate? Yes; Fluid consistency: Thin  Diet effective now                  Inpatient Medications Scheduled Meds: . aerochamber plus with mask  1 each Other Once  . albuterol  2 puff Inhalation TID  . amLODipine  10 mg Oral Daily  . apixaban  10 mg Oral BID   Followed by  . [START ON 08/14/2020] apixaban  5 mg Oral BID  . vitamin C  500 mg Oral Daily  . insulin aspart  0-15 Units Subcutaneous TID WC  . insulin aspart  0-5 Units Subcutaneous QHS  . insulin glargine  5 Units Subcutaneous Daily  . levothyroxine  137 mcg Oral QAC breakfast  . losartan  100 mg Oral Daily  . methylPREDNISolone (SOLU-MEDROL) injection  40 mg Intravenous Daily  . pantoprazole  40 mg Oral Daily  . sertraline  100 mg Oral Daily  . umeclidinium bromide  1 puff Inhalation Daily  . zinc sulfate  220 mg Oral Daily   Continuous Infusions: . sodium chloride  PRN Meds:.sodium chloride, acetaminophen, albuterol, ALPRAZolam, chlorpheniramine-HYDROcodone, guaiFENesin-dextromethorphan, [DISCONTINUED] ondansetron **OR** ondansetron (ZOFRAN) IV  Antibiotics  :   Anti-infectives (From admission, onward)   Start     Dose/Rate Route Frequency Ordered Stop   08/07/20 1000  remdesivir 100 mg in sodium chloride 0.9 % 100 mL IVPB       "Followed by" Linked Group Details   100 mg 200 mL/hr over 30 Minutes Intravenous Daily 08/06/20 1449 08/10/20 0913   08/07/20 1000  remdesivir 100 mg in sodium chloride  0.9 % 100 mL IVPB  Status:  Discontinued       "Followed by" Linked Group Details   100 mg 200 mL/hr over 30 Minutes Intravenous Daily 08/06/20 1503 08/06/20 1506   08/06/20 1700  remdesivir 200 mg in sodium chloride 0.9% 250 mL IVPB       "Followed by" Linked Group Details   200 mg 580 mL/hr over 30 Minutes Intravenous Once 08/06/20 1449 08/07/20 0345   08/06/20 1515  remdesivir 200 mg in sodium chloride 0.9% 250 mL IVPB  Status:  Discontinued       "Followed by" Linked Group Details   200 mg 580 mL/hr over 30 Minutes Intravenous Once 08/06/20 1503 08/06/20 1506          Objective:   Vitals:   08/11/20 0022 08/11/20 0406 08/11/20 0745 08/11/20 0758  BP: 128/80 125/80 135/79 128/68  Pulse: 81 93 77 75  Resp: 16 17 17 18   Temp: 97.8 F (36.6 C) 99 F (37.2 C) 97.7 F (36.5 C) 98.7 F (37.1 C)  TempSrc:  Axillary Axillary Oral  SpO2: 95% 93% 93% 90%  Weight:      Height:        SpO2: 90 % O2 Flow Rate (L/min): 3 L/min  Wt Readings from Last 3 Encounters:  08/06/20 95.3 kg  07/27/20 81.6 kg  10/24/18 103.1 kg     Intake/Output Summary (Last 24 hours) at 08/11/2020 1209 Last data filed at 08/10/2020 1351 Gross per 24 hour  Intake 240 ml  Output --  Net 240 ml     Physical Exam  Awake Alert, NAD Symmetrical Chest wall movement +ve B.Sounds, Abd Soft,  No Cyanosis, Clubbing or edema,     Pressure Injury 07/10/20 Buttocks Left Stage 2 -  Partial thickness loss of dermis presenting as a shallow open injury with a red, pink wound bed without slough. (Active)  07/10/20 2330  Location: Buttocks  Location Orientation: Left  Staging: Stage 2 -  Partial thickness loss of dermis presenting as a shallow open injury with a red, pink wound bed without slough.  Wound Description (Comments):   Present on Admission: Yes     Pressure Injury 07/11/20 Buttocks Left Stage 2 -  Partial thickness loss of dermis presenting as a shallow open injury with a red, pink wound  bed without slough. (Active)  07/11/20 2330  Location: Buttocks  Location Orientation: Left  Staging: Stage 2 -  Partial thickness loss of dermis presenting as a shallow open injury with a red, pink wound bed without slough.  Wound Description (Comments):   Present on Admission: Yes     Data Review:   Recent Labs  Lab 08/06/20 1511 08/07/20 0248 08/08/20 0403 08/10/20 0459 08/11/20 0059  WBC 3.9* 2.2* 2.4* 3.4* 4.4  HGB 9.8* 10.4* 9.2* 9.7* 10.4*  HCT 34.4* 34.8* 31.8* 33.3* 36.1  PLT 245 262 251 246 270  MCV 78.5* 77.9* 77.0* 78.0* 79.7*  MCH 22.4* 23.3*  22.3* 22.7* 23.0*  MCHC 28.5* 29.9* 28.9* 29.1* 28.8*  RDW Not Measured Not Measured Not Measured Not Measured 30.1*  LYMPHSABS 1.1 0.7 1.1 1.9 2.0  MONOABS 0.4 0.1 0.3 0.4 0.4  EOSABS 0.0 0.0 0.0 0.0 0.0  BASOSABS 0.0 0.0 0.0 0.0 0.0    Recent Labs  Lab 08/06/20 1305 08/06/20 1305 08/06/20 1435 08/06/20 1456 08/06/20 1503 08/06/20 1509 08/07/20 0248 08/07/20 0814 08/08/20 0403 08/08/20 1031 08/09/20 0300 08/10/20 0459 08/11/20 0059  NA 128*   < >  --    < >  --   --  131*  --  133*  --  134* 133* 133*  K 3.9   < >  --    < >  --   --  3.9  --  4.0  --  3.9 3.1* 3.9  CL 89*   < >  --   --   --   --  92*  --  92*  --  94* 92* 95*  CO2 28   < >  --   --   --   --  30  --  33*  --  31 30 29   GLUCOSE 89   < >  --   --   --   --  127*  --  101*  --  95 81 80  BUN 7*   < >  --   --   --   --  6*  --  10  --  9 10 10   CREATININE 0.47   < >  --   --   --   --  0.50  --  0.52  --  0.46 0.43* 0.41*  CALCIUM 8.4*   < >  --   --   --   --  8.7*  --  8.5*  --  8.6* 8.7* 8.8*  AST 21   < >  --   --   --   --  18  --  19  --  19 19 32  ALT 13   < >  --   --   --   --  15  --  14  --  16 20 40  ALKPHOS 52   < >  --   --   --   --  46  --  42  --  43 39 48  BILITOT 0.5   < >  --   --   --   --  0.3  --  0.5  --  0.4 0.5 0.5  ALBUMIN 3.0*   < >  --   --   --   --  2.9*  --  2.9*  --  2.9* 3.0* 3.2*  MG  --   --   --   --    --   --  1.9  --  1.9  --  1.9 1.8 1.9  CRP 5.5*   < >  --   --   --   --  6.6*  --  3.0*  --  1.6* 1.0* 1.1*  DDIMER 5.89*  --   --   --   --   --  5.09*  --   --   --   --   --   --   PROCALCITON <0.10  --   --   --   --   --   --   --   --   --   --   --   --  LATICACIDVEN 0.9  --  1.1  --   --   --   --   --   --   --   --   --   --   INR  --   --   --   --   --  1.2 1.2  --  1.9*  --  1.8*  --   --   TSH  --   --   --   --   --   --   --   --   --  1.275  --   --   --   HGBA1C  --   --   --   --   --   --   --  4.7*  --   --   --   --   --   BNP  --   --   --   --  23.3  --  38.8  --  29.6  --   --  43.8 36.8   < > = values in this interval not displayed.    Recent Labs  Lab 08/06/20 1305 08/06/20 1305 08/06/20 1503 08/07/20 0248 08/08/20 0403 08/09/20 0300 08/10/20 0459 08/11/20 0059  CRP 5.5*   < >  --  6.6* 3.0* 1.6* 1.0* 1.1*  DDIMER 5.89*  --   --  5.09*  --   --   --   --   BNP  --   --  23.3 38.8 29.6  --  43.8 36.8  PROCALCITON <0.10  --   --   --   --   --   --   --   SARSCOV2NAA POSITIVE*  --   --   --   --   --   --   --    < > = values in this interval not displayed.    ------------------------------------------------------------------------------------------------------------------ No results for input(s): CHOL, HDL, LDLCALC, TRIG, CHOLHDL, LDLDIRECT in the last 72 hours.  Lab Results  Component Value Date   HGBA1C 4.7 (L) 08/07/2020   ------------------------------------------------------------------------------------------------------------------ No results for input(s): TSH, T4TOTAL, T3FREE, THYROIDAB in the last 72 hours.  Invalid input(s): FREET3 ------------------------------------------------------------------------------------------------------------------ No results for input(s): VITAMINB12, FOLATE, FERRITIN, TIBC, IRON, RETICCTPCT in the last 72 hours.  Coagulation profile Recent Labs  Lab 08/06/20 1509 08/07/20 0248 08/08/20 0403  08/09/20 0300  INR 1.2 1.2 1.9* 1.8*    No results for input(s): DDIMER in the last 72 hours.  Cardiac Enzymes No results for input(s): CKMB, TROPONINI, MYOGLOBIN in the last 168 hours.  Invalid input(s): CK ------------------------------------------------------------------------------------------------------------------    Component Value Date/Time   BNP 36.8 08/11/2020 0059    Micro Results Recent Results (from the past 240 hour(s))  Blood Culture (routine x 2)     Status: None   Collection Time: 08/06/20  1:00 PM   Specimen: BLOOD  Result Value Ref Range Status   Specimen Description BLOOD RIGHT ANTECUBITAL  Final   Special Requests   Final    BOTTLES DRAWN AEROBIC AND ANAEROBIC Blood Culture results may not be optimal due to an inadequate volume of blood received in culture bottles   Culture   Final    NO GROWTH 5 DAYS Performed at [facility], [place] [address] [place], [place] [address]    Report Status 08/11/2020 FINAL  Final  Respiratory Panel by RT PCR (Flu A&B, Covid) - Nasopharyngeal Swab     Status: Abnormal   Collection Time:  08/06/20  1:05 PM   Specimen: Nasopharyngeal Swab  Result Value Ref Range Status   SARS Coronavirus 2 by RT PCR POSITIVE (A) NEGATIVE Final    Comment: emailed L. Berdik RN 14:40 08/06/20 ([email or web address]) (NOTE) SARS-CoV-2 target nucleic acids are DETECTED.  SARS-CoV-2 RNA is generally detectable in upper respiratory specimens  during the acute phase of infection. Positive results are indicative of the presence of the identified virus, but do not rule out bacterial infection or co-infection with other pathogens not detected by the test. Clinical correlation with patient history and other diagnostic information is necessary to determine patient infection status. The expected result is Negative.  Fact Sheet for Patients:  PinkCheek.be  Fact Sheet for Healthcare  Providers: GravelBags.it  This test is not yet approved or cleared by the [place] FDA and  has been authorized for detection and/or diagnosis of SARS-CoV-2 by FDA under an Emergency Use Authorization (EUA).  This EUA will remain in effect (meaning this test can be used) for the duration of  the COVID-19  declaration under Section 564(b)(1) of the Act, 21 U.S.C. section 360bbb-3(b)(1), unless the authorization is terminated or revoked sooner.      Influenza A by PCR NEGATIVE NEGATIVE Final   Influenza B by PCR NEGATIVE NEGATIVE Final    Comment: (NOTE) The Xpert Xpress SARS-CoV-2/FLU/RSV assay is intended as an aid in  the diagnosis of influenza from Nasopharyngeal swab specimens and  should not be used as a sole basis for treatment. Nasal washings and  aspirates are unacceptable for Xpert Xpress SARS-CoV-2/FLU/RSV  testing.  Fact Sheet for Patients: PinkCheek.be  Fact Sheet for Healthcare Providers: GravelBags.it  This test is not yet approved or cleared by the [place] FDA and  has been authorized for detection and/or diagnosis of SARS-CoV-2 by  FDA under an Emergency Use Authorization (EUA). This EUA will remain  in effect (meaning this test can be used) for the duration of the  Covid-19 declaration under Section 564(b)(1) of the Act, 21  U.S.C. section 360bbb-3(b)(1), unless the authorization is  terminated or revoked. Performed at [facility], [place] [address] [place], [place] [address]   Blood Culture (routine x 2)     Status: None   Collection Time: 08/06/20  1:22 PM   Specimen: BLOOD RIGHT WRIST  Result Value Ref Range Status   Specimen Description BLOOD RIGHT WRIST  Final   Special Requests   Final    BOTTLES DRAWN AEROBIC AND ANAEROBIC Blood Culture results may not be optimal due to an inadequate volume of blood received in culture bottles   Culture   Final     NO GROWTH 5 DAYS Performed at [facility], [place] [address], [place], [place] [address]    Report Status 08/11/2020 FINAL  Final  MRSA PCR Screening     Status: None   Collection Time: 08/07/20  6:23 AM   Specimen: Nasal Mucosa; Nasopharyngeal  Result Value Ref Range Status   MRSA by PCR NEGATIVE NEGATIVE Final    Comment:        The GeneXpert MRSA Assay (FDA approved for NASAL specimens only), is one component of a comprehensive MRSA colonization surveillance program. It is not intended to diagnose MRSA infection nor to guide or monitor treatment for MRSA infections. Performed at [facility], [place] [address], [place], [place] [address]     Radiology Reports DG Chest 2 View  Result Date: 07/26/2020 CLINICAL DATA:  Left pleural effusion  EXAM: CHEST - 2 VIEW COMPARISON:  07/24/2020 FINDINGS: Small left pleural effusion is increased. Left basilar atelectasis is present. No pneumothorax. Background changes of emphysema. Stable cardiomediastinal contours. IMPRESSION: Small left pleural effusion increased since 07/24/2020. Mild left basilar atelectasis. Electronically Signed   By: Macy Mis M.D.   On: 07/26/2020 08:07   CT CHEST WO CONTRAST  Result Date: 07/23/2020 CLINICAL DATA:  Abnormal radiograph, pleural effusion EXAM: CT CHEST WITHOUT CONTRAST TECHNIQUE: Multidetector CT imaging of the chest was performed following the standard protocol without IV contrast. COMPARISON:  Radiograph 07/21/2020, CT 07/10/2020 FINDINGS: Cardiovascular: Cardiac size within normal limits. Small volume pericardial fluid is similar to prior. Dense calcifications along the mitral annulus. Additional calcifications of the aortic leaflets. Hypoattenuation of the cardiac blood pool compatible with anemia. Extensive three-vessel native coronary artery atherosclerosis. Atherosclerotic plaque within the normal caliber aorta. No hyperdense mural thickening or plaque displacement nor periaortic stranding  or hemorrhage. Shared origin of the brachiocephalic and left common carotid arteries. Additional calcifications seen throughout the proximal great vessels. Central pulmonary arteries are normal caliber. Luminal evaluation precluded in the absence of contrast media. No visible worrisome venous abnormalities. Mediastinum/Nodes: Persistent slight leftward mediastinal shift, similar to comparison. No mediastinal fluid, gas or hemorrhage. Redemonstration of multiple asymmetrically prominent though non pathologically enlarged left axillary and mediastinal lymph nodes. No right axillary adenopathy. Hilar nodal is severely limited in the absence of contrast media. No acute abnormality of the trachea or esophagus. Thyroid gland is diminutive, correlate for prior thyroidectomy or ablation. Lungs/Pleura: Background of moderate centrilobular emphysematous changes throughout both lungs. There is a moderate residual left pleural effusion with adjacent areas of passive atelectasis in the left lung base. Some additional thickening is noted along the left major fissure which may reflect some persistent loculated fluid. Adjacent atelectatic changes are present along the left major fissure as well. Predominantly involving the lingula and posterior segment left upper lobe. Underlying consolidation would be difficult to exclude. Some there is a stable sub solid opacity in the posterosuperior segment right lower lobe (4/74 measuring up to 8 mm. Possibly post infectious or inflammatory. Stable scarring within the anterior basal segment right lower lobe and major fissure. Right lung is otherwise clear. Upper Abdomen: Multiple calcified gallstones within the largely decompressed gallbladder. Scattered colonic diverticula without focal inflammation to suggest diverticulitis. Extensive upper abdominal atherosclerotic calcification. Stable likely parapelvic cyst in the upper pole left kidney. Musculoskeletal: Mild body wall edema, left  slightly more pronounced than right. No acute abnormality or worrisome osseous lesions. Levocurvature of the upper thoracic spine with some mild dextrocurvature of the mid to lower levels. Multilevel discogenic and facet degenerative changes are present. Additional degenerative changes of the right shoulder and sternoclavicular joints similar to prior. IMPRESSION: 1. Moderate residual left pleural effusion with adjacent areas of passive atelectasis in the left lung base. Some additional thickening along the left major fissure may reflect some persistent loculated fluid with some further atelectatic change in the lingula as well. Underlying consolidation within the atelectatic lung would cannot be fully excluded. 2. Stable appearance of an 8 mm ground-glass opacity in the posterosuperior segment right lower lobe. Possibly post infectious or inflammatory. Recommend attention on follow-up imaging. Initial follow-up with CT at 6-12 months is recommended to confirm persistence. If persistent, repeat CT is recommended every 2 years until 5 years of stability has been established. This recommendation follows the consensus statement: Guidelines for Management of Incidental Pulmonary Nodules Detected on CT Images: From the [employer]  2017; Radiology 2017; 284:228-243. 3. Redemonstration of multiple asymmetrically prominent though non pathologically enlarged left axillary and mediastinal lymph nodes, nonspecific though possibly reactive or edematous. 4. Cholelithiasis without evidence of acute cholecystitis. 5. Colonic diverticulosis without evidence of diverticulitis. 6. Aortic Atherosclerosis (ICD10-I70.0) 7. Three-vessel coronary artery calcifications. 8. Emphysema (ICD10-J43.9). Electronically Signed   By: Lovena Le M.D.   On: 07/23/2020 22:26   CT ANGIO CHEST PE W OR WO CONTRAST  Result Date: 08/07/2020 CLINICAL DATA:  PE suspected, established right-sided PE, increased shortness of breath, assess embolus  burden EXAM: CT ANGIOGRAPHY CHEST WITH CONTRAST TECHNIQUE: Multidetector CT imaging of the chest was performed using the standard protocol during bolus administration of intravenous contrast. Multiplanar CT image reconstructions and MIPs were obtained to evaluate the vascular anatomy. CONTRAST:  4mL OMNIPAQUE IOHEXOL 350 MG/ML SOLN COMPARISON:  None. FINDINGS: Cardiovascular: Examination for pulmonary embolism is somewhat limited by breath motion artifact. Within this limitation, there are occasional probable subsegmental emboli, for example in the dependent right lower lobe (series 6, image 76) and in the dependent left lower lobe (series 6, image 63). There is no segmental or more central emboli identified. Aortic atherosclerosis. Normal heart size. Three-vessel coronary artery calcifications. Enlargement of the main pulmonary artery measuring up to 3.6 cm in caliber. Small pericardial effusion. Mediastinum/Nodes: No enlarged mediastinal, hilar, or axillary lymph nodes. Thyroid gland, trachea, and esophagus demonstrate no significant findings. Lungs/Pleura: Moderate centrilobular emphysema. Moderate left pleural effusion and associated atelectasis or consolidation, similar to prior examination. Upper Abdomen: No acute abnormality. Musculoskeletal: No chest wall abnormality. No acute or significant osseous findings. Review of the MIP images confirms the above findings. IMPRESSION: 1. Examination for pulmonary embolism is somewhat limited by breath motion artifact. Within this limitation, there are occasional probable subsegmental emboli present bilaterally. No segmental or more central embolus identified. 2. Moderate left pleural effusion and associated atelectasis or consolidation, similar to prior examination. 3. Emphysema (ICD10-J43.9). 4. Coronary artery disease. Aortic Atherosclerosis (ICD10-I70.0) 5. Enlargement of the main pulmonary artery measuring up to 3.6 cm in caliber, as can be seen in pulmonary  hypertension. Electronically Signed   By: Eddie Candle M.D.   On: 08/07/2020 15:33   DG Chest Port 1 View  Result Date: 08/06/2020 CLINICAL DATA:  COVID-19 positive with shortness of breath EXAM: PORTABLE CHEST 1 VIEW COMPARISON:  July 26, 2020 FINDINGS: There is left lower lobe airspace opacity with small left pleural effusion. The lungs elsewhere are clear. The heart is upper normal in size with pulmonary vascularity normal. No adenopathy. There is aortic atherosclerosis. No bone lesions. IMPRESSION: Small left pleural effusion. Airspace opacity in the left lower lobe concerning for pneumonia with likely associated atelectasis. Lungs elsewhere clear. Stable cardiac silhouette. Aortic Atherosclerosis (ICD10-I70.0). Electronically Signed   By: Lowella Grip III M.D.   On: 08/06/2020 13:31   DG CHEST PORT 1 VIEW  Result Date: 07/24/2020 CLINICAL DATA:  Status post left thoracentesis EXAM: PORTABLE CHEST 1 VIEW COMPARISON:  07/21/2020 FINDINGS: Trace left pleural effusion, improved status post thoracentesis. No pneumothorax is seen. Right lung is essentially clear. The heart is normal in size.  Thoracic aortic atherosclerosis. IMPRESSION: Trace left pleural effusion, improved status post thoracentesis. No pneumothorax is seen. Electronically Signed   By: Julian Hy M.D.   On: 07/24/2020 13:01   DG Chest Port 1 View  Result Date: 07/21/2020 CLINICAL DATA:  Shortness of breath EXAM: PORTABLE CHEST 1 VIEW COMPARISON:  July 15, 2020 FINDINGS: There is consolidation throughout the  left mid and lower lung regions with questionable superimposed left pleural effusion. The right lung is clear. Heart is upper normal in size with pulmonary vascularity normal. No adenopathy. There is aortic atherosclerosis. No bone lesions. IMPRESSION: Consolidation throughout much of the left mid and lower lung regions with suspected small pleural effusions superimposed on the left. Right lung clear. Heart upper  normal in size. No adenopathy. Aortic Atherosclerosis (ICD10-I70.0). Electronically Signed   By: Lowella Grip III M.D.   On: 07/21/2020 14:53   DG Chest Port 1 View  Result Date: 07/15/2020 CLINICAL DATA:  Left chest tube EXAM: PORTABLE CHEST 1 VIEW COMPARISON:  07/14/2020 FINDINGS: Slight retraction of the left small bore chest tube. No significant pneumothorax by plain radiography. Persistent left basilar collapse/consolidation. No enlarging effusion. Stable right lung aeration. Heart remains enlarged. Aorta atherosclerotic.  Degenerative changes noted of the spine. IMPRESSION: Slight retraction of the left chest tube. No current significant pneumothorax by plain radiography Persistent left lower lung collapse/consolidation. Aortic Atherosclerosis (ICD10-I70.0). Electronically Signed   By: Jerilynn Mages.  Shick M.D.   On: 07/15/2020 08:00   DG Chest Port 1 View  Result Date: 07/14/2020 CLINICAL DATA:  Chest tube present EXAM: PORTABLE CHEST 1 VIEW COMPARISON:  July 14, 2020 study obtained earlier in the day. FINDINGS: Chest tube on the left is unchanged in position. No evident pneumothorax. There is bibasilar atelectatic change, somewhat more on the left than on the right, stable. No new opacity evident. Heart is upper normal in size with pulmonary vascularity normal. No adenopathy. There is aortic atherosclerosis. No bone lesions. There is bilateral carotid artery calcification. IMPRESSION: Chest tube unchanged on the left without evident pneumothorax. Lower lung region atelectasis is stable. No new opacity. Stable cardiac silhouette. Aortic Atherosclerosis (ICD10-I70.0). There is bilateral carotid artery calcification. Electronically Signed   By: Lowella Grip III M.D.   On: 07/14/2020 13:25   DG Chest Port 1 View  Result Date: 07/14/2020 CLINICAL DATA:  Chest tube surveillance EXAM: PORTABLE CHEST 1 VIEW COMPARISON:  07/13/2020 FINDINGS: Left-sided chest tube remains in place. Stable cardiomegaly.  Aortic atherosclerosis. Suspect small residual left-sided pleural effusion. Persistent bibasilar atelectasis. No pneumothorax is seen. IMPRESSION: 1. Left-sided chest tube remains in place. No pneumothorax. 2. Persistent bibasilar atelectasis and probable small residual left pleural effusion. Electronically Signed   By: Davina Poke D.O.   On: 07/14/2020 08:18   DG Chest Port 1 View  Result Date: 07/13/2020 CLINICAL DATA:  69 year old female with pleural effusion, chest tube placed. EXAM: PORTABLE CHEST 1 VIEW COMPARISON:  Portable chest 07/12/2020 and earlier. FINDINGS: Portable AP semi upright view at 1222 hours. Left chest tube position has not significantly changed. No pneumothorax. No residual pleural effusion is evident now. Mild patchy residual opacity at the left lung base. Mediastinal contours are within normal limits. Allowing for portable technique the right lung appears clear. Negative trachea. No acute osseous abnormality identified. IMPRESSION: 1. Stable left chest tube. Residual atelectasis but no pneumothorax or residual pleural effusion. 2. No new cardiopulmonary abnormality. Electronically Signed   By: Genevie Ann M.D.   On: 07/13/2020 12:41   DG CHEST PORT 1 VIEW  Result Date: 07/12/2020 CLINICAL DATA:  Left pleural effusion EXAM: PORTABLE CHEST 1 VIEW COMPARISON:  07/11/2020 FINDINGS: Decreasing left pleural effusion with left chest tube in place. Moderate to large left effusion remains with improved aeration in the left lung. Continued left lower lobe atelectasis or infiltrate. No confluent opacity on the right. No pneumothorax. Aortic atherosclerosis. IMPRESSION:  Decreasing left effusion. The left effusion remains moderate to large with left lower lobe atelectasis or infiltrate. Left chest tube in place.  No visible pneumothorax. Electronically Signed   By: Rolm Baptise M.D.   On: 07/12/2020 15:56    Phillips Climes M.D on 08/11/2020 at 12:09 PM  To page go to [email or web address]

## 2020-08-12 DIAGNOSIS — U071 COVID-19: Secondary | ICD-10-CM | POA: Diagnosis not present

## 2020-08-12 DIAGNOSIS — J1282 Pneumonia due to coronavirus disease 2019: Secondary | ICD-10-CM | POA: Diagnosis not present

## 2020-08-12 LAB — GLUCOSE, CAPILLARY
Glucose-Capillary: 90 mg/dL (ref 70–99)
Glucose-Capillary: 87 mg/dL (ref 70–99)
Glucose-Capillary: 88 mg/dL (ref 70–99)
Glucose-Capillary: 116 mg/dL — ABNORMAL HIGH (ref 70–99)

## 2020-08-12 NOTE — Progress Notes (Signed)
Physical Therapy Treatment Patient Details Name: Jaime Kelley MRN: [id number] DOB: 1951/08/05 Today's Date: 08/12/2020    History of Present Illness Pt is a 69 y.o. female with recent admission 07/10/20-07/27/20 with large L-side pleural effusion with d/c to SNF, now readmitted from [facility] 08/06/20 with hypoxia; pt had tested (+) COVID-19 on 10/14. Workup for COVID-19 PNA; chronic hypoxemic respiratory failure. PMH includes COPD (5L O2 baseline), RA, PE/DVT on Coumadin, DM2, HTN, obesity, depression/anxiety; pt vaccinated 10/5.    PT Comments    Patient easily directed to participate in functional tasks (holding/drinking coffee, transfer to EOB, transfer to chair). Performed lateral scoot transfer bed to recliner (dropped arm to facilitate) with 2 person moderate assist (almost could be done by one person).     Follow Up Recommendations  SNF;Supervision/Assistance - 24 hour     Equipment Recommendations   (defer)    Recommendations for Other Services       Precautions / Restrictions Precautions Precautions: Fall;Other (comment) Restrictions Weight Bearing Restrictions: No    Mobility  Bed Mobility Overal bed mobility: Needs Assistance Bed Mobility: Supine to Sit;Sit to Supine;Rolling Rolling: Min assist   Supine to sit: Mod assist;HOB elevated     General bed mobility comments: Easily directed to sit EOB for breakfast/coffee.   Transfers Overall transfer level: Needs assistance   Transfers: Lateral/Scoot Transfers          Lateral/Scoot Transfers: Mod assist;+2 physical assistance;From elevated surface (bed ~2" higher than recliner) General transfer comment: pt assisted in scooting with bil UEs and leaning torso forward to unweight hips  Ambulation/Gait             General Gait Details: refuses to bear weight due to knee pain   Stairs             Wheelchair Mobility    Modified Rankin (Stroke Patients Only)       Balance Overall  balance assessment: Needs assistance Sitting-balance support: Feet supported;No upper extremity supported Sitting balance-[name]: Fair                                      Cognition Arousal/Alertness: Awake/alert Behavior During Therapy: Flat affect Overall Cognitive Status: No family/caregiver present to determine baseline cognitive functioning Area of Impairment: Attention;Memory;Following commands;Safety/judgement;Awareness;Problem solving;Orientation                   Current Attention Level: Selective   Following Commands: Follows one step commands with increased time Safety/Judgement: Decreased awareness of safety;Decreased awareness of deficits Awareness: Intellectual Problem Solving: Slow processing;Decreased initiation;Difficulty sequencing;Requires verbal cues;Requires tactile cues General Comments: able to engage in conversation to keep her engaged in session      Exercises      General Comments        Pertinent Vitals/Pain Pain Assessment: Faces Faces Pain Scale: Hurts even more Pain Location: L knee Pain Descriptors / Indicators: Discomfort;Grimacing;Guarding;Tender Pain Intervention(s): Limited activity within patient's tolerance;Monitored during session    Home Living                      Prior Function            PT Goals (current goals can now be found in the care plan section) Acute Rehab PT Goals Patient Stated Goal: "I want to get back to everything I was doing before" (including cooking, baking) Time For Goal Achievement:  08/21/20 Potential to Achieve Goals: Poor Progress towards PT goals: Progressing toward goals    Frequency    Min 2X/week      PT Plan Current plan remains appropriate    Co-evaluation PT/OT/SLP Co-Evaluation/Treatment: Yes Reason for Co-Treatment: Necessary to address cognition/behavior during functional activity;For patient/therapist safety PT goals addressed during session:  Mobility/safety with mobility;Balance        AM-PAC PT "6 Clicks" Mobility   Outcome Measure  Help needed turning from your back to your side while in a flat bed without using bedrails?: A Little Help needed moving from lying on your back to sitting on the side of a flat bed without using bedrails?: A Lot Help needed moving to and from a bed to a chair (including a wheelchair)?: Total Help needed standing up from a chair using your arms (e.g., wheelchair or bedside chair)?: Total Help needed to walk in hospital room?: Total Help needed climbing 3-5 steps with a railing? : Total 6 Click Score: 9    End of Session Equipment Utilized During Treatment: Oxygen Activity Tolerance: Patient limited by pain (left knee worse than rt knee) Patient left: with call bell/phone within reach;in chair;with chair alarm set Nurse Communication: Mobility status;Need for lift equipment PT Visit Diagnosis: Muscle weakness (generalized) (M62.81)     Time: 4210-3128 PT Time Calculation (min) (ACUTE ONLY): 34 min  Charges:  $Therapeutic Activity: 8-22 mins                      Arby Barrette, PT Pager [phone]    Rexanne Mano 08/12/2020, 11:55 AM

## 2020-08-12 NOTE — TOC Progression Note (Addendum)
Transition of Care [facility]) - Progression Note    Patient Details  Name: Jaime Kelley MRN: [id number] Date of Birth: Sep 12, 1951  Transition of Care [facility]) CM/SW [place], LCSW Phone Number: 08/12/2020, 2:12 PM  Clinical Narrative:    CSW asked [facility] if they would be able to accept patient back after isolation period ends and they reported they would not accept her back. Patient does not have any bed offers at this time. CSW emailed Financial Counseling to see if they can apply for facility Medicaid for patient to ensure she can afford long term care.    Expected Discharge Plan: Skilled Nursing Facility Barriers to Discharge: Family Issues, Unsafe home situation, SNF Pending bed offer  Expected Discharge Plan and Services Expected Discharge Plan: [place] In-house Referral: Clinical Social Work   Post Acute Care Choice: [place] Living arrangements for the past 2 months: [place], [address]                                       Social Determinants of Health (SDOH) Interventions    Readmission Risk Interventions Readmission Risk Prevention Plan 08/10/2020 07/27/2020  Transportation Screening Complete Complete  PCP or Specialist Appt within 3-5 Days - Complete  HRI or [place] - Complete  Social Work Consult for [place] Planning/Counseling - Complete  Palliative Care Screening - Not Applicable  Medication Review [employer] photographer) Referral to Pharmacy Complete  PCP or Specialist appointment within 3-5 days of discharge Complete -  [place] or Home Care Consult Complete -  SW Recovery Care/Counseling Consult Complete -  Palliative Care Screening Not Applicable -  [place] Complete -  Some recent data might be hidden

## 2020-08-12 NOTE — Progress Notes (Signed)
PROGRESS NOTE                                                                                                                                                                                                             Patient Demographics:    Jaime Kelley, is a 69 y.o. female, DOB - 07-26-1951, PJK:[id number]  Admit date - 08/06/2020   Admitting Physician Mckinley Jewel, MD  Outpatient Primary MD for the patient is Laurey Morale, MD  LOS - 6  Chief Complaint  Patient presents with  . Shortness of Breath    COVID +       Brief Narrative (HPI from H&P)  - Jaime Kelley is a 69 y.o. female with medical history significant of hypertension, type 2 diabetes mellitus, morbid obesity, depression/anxiety, chronic hypoxemic respiratory failure secondary to underlying COPD-on 5 L of oxygen at baseline, RA, hypothyroidism, history of PE/DVT-on Coumadin, GERD presents to emergency department for evaluation of hypoxia.  She was recently admitted on 9/18 with complaints of substernal chest pressure and was found to have large left-sided pleural effusion with complete collapse of left lung requiring nonrebreather initially.  Patient underwent left pleural space drainage and chest tube placement which was removed on 9/23.  Bronchoscopy was performed on 9/20 with removal of solitary mucous plug.  Repeat chest x-ray showed recurrent opacification.  Pulmonology performed a repeat thoracentesis at bedside on 07/24/2020.  Chest x-ray on 10/5 demonstrated a small amount of reaccumulation of pleural effusion however oxygen requirements improved to 3 L therefore she discharged to SNF in a stable condition and received [place] Covid vaccine on 10/5.  Work-up here suggestive of acute PE plus minus possible mild Covid pneumonia.    Subjective:   Patient in bed, appears comfortable, denies headache, fever, chest pain .  No significant events overnight as discussed with patient or  staff.  Assessment  & Plan :    Acute on chronic hypoxic respiratory failure -Multifactorial, in the setting of baseline COPD. -currently with acute PE.  -she was recently admitted with large left-sided pleural effusion requiring thoracentesis and bronchoscopy for removal of solitary mucous plug.  She also recently received Covid vaccine on 07/27/2020.  She is now here with shortness of breath with minimal left-sided pleural effusion and atelectasis.  Her present acute on chronic hypoxia could be due to mild bronchitis + PE , doubt she has true COVID-19 pneumonia, for now since started will finish steroids(taper) and Remdesivir, she is  also chronically on Coumadin with subtherapeutic INR, she was treated initially with Lovenox, now transition to Eliquis,.  Continue supplemental oxygen note she is on 5 L of nasal cannula oxygen at home.     Recent Labs  Lab 08/06/20 1305 08/06/20 1305 08/06/20 1435 08/06/20 1456 08/06/20 1503 08/06/20 1509 08/06/20 1511 08/07/20 0248 08/08/20 0403 08/09/20 0300 08/10/20 0459 08/11/20 0059  WBC  --   --   --   --   --   --  3.9* 2.2* 2.4*  --  3.4* 4.4  HGB  --   --   --    < >  --   --  9.8* 10.4* 9.2*  --  9.7* 10.4*  HCT  --   --   --    < >  --   --  34.4* 34.8* 31.8*  --  33.3* 36.1  PLT  --   --   --   --   --   --  245 262 251  --  246 270  CRP 5.5*   < >  --   --   --   --   --  6.6* 3.0* 1.6* 1.0* 1.1*  BNP  --   --   --   --  23.3  --   --  38.8 29.6  --  43.8 36.8  DDIMER 5.89*  --   --   --   --   --   --  5.09*  --   --   --   --   PROCALCITON <0.10  --   --   --   --   --   --   --   --   --   --   --   AST 21   < >  --   --   --   --   --  18 19 19 19  32  ALT 13   < >  --   --   --   --   --  15 14 16 20  40  ALKPHOS 52   < >  --   --   --   --   --  46 42 43 39 48  BILITOT 0.5   < >  --   --   --   --   --  0.3 0.5 0.4 0.5 0.5  ALBUMIN 3.0*   < >  --   --   --   --   --  2.9* 2.9* 2.9* 3.0* 3.2*  INR  --   --   --   --   --  1.2  --   1.2 1.9* 1.8*  --   --   LATICACIDVEN 0.9  --  1.1  --   --   --   --   --   --   --   --   --   SARSCOV2NAA POSITIVE*  --   --   --   --   --   --   --   --   --   --   --    < > = values in this interval not displayed.     Underlying COPD with 5 L nasal cannula oxygen at home.   -Supportive care.  Treated with IV steroids, now transition to p.o. prednisone, she is with no wheezing currently  Social/ethics -Patient reports she wants to go home, did not want placement, as well  she threatened to leave AMA, though she is awake alert x3 intermittently, she is with some episodes of confusion and delusions intermittently, psychiatry were consulted during previous admission, and this admission as well, for which she has deemed not to have capacity to make her own medical decisions, and placement decisions as well. -Social worker has been consulted, trying to assist with SNF placement. -. Stable TSH, B12 and RPR.  Acute PE with history of DVT. -  Was on Coumadin with subtherapeutic INR, has a new PE, placed on Lovenox >> Eliquis on 08/09/2020.  Anxiety and depression  -on Zoloft and Xanax.  Continue.  Hypothyroidism.  On Synthroid.  Essential hypertension.  On Norvasc and ARB.  Morbid obesity.  BMI 41.  Follow with PCP.   Hypokalemia -Repleted  DM type II.  Low-dose Lantus and sliding scale and monitor.  Lab Results  Component Value Date   HGBA1C 4.7 (L) 08/07/2020   CBG (last 3)  Recent Labs    08/11/20 2053 08/12/20 0735 08/12/20 1145  GLUCAP 174* 90 87      Condition - Extremely Guarded  Family Communication  :    Daughter [phone] - 11/08/19 left voicemail 08/11/2020  Son Simon Rhein - [phone] -11/08/2019, extremely argumentative and rude and slammed the phone down.  Would not let me finish my conversation.  Patient states that son has no right to interfere with her medical decision making.  Code Status :  Full  Consults  : Psych  Procedures  :    PUD  Prophylaxis : PPI  Disposition Plan  :    Status is: Inpatient  Remains inpatient appropriate because:Unsafe d/c plan   Dispo: The patient is from: Home              Anticipated d/c is to: SNF              Anticipated d/c date is: 1 day              Patient currently is medically stable to d/c.   DVT Prophylaxis  :  Lovenox >> Coumadin  Lab Results  Component Value Date   INR 1.8 (H) 08/09/2020   INR 1.9 (H) 08/08/2020   INR 1.2 08/07/2020     Lab Results  Component Value Date   PLT 270 08/11/2020    Diet :  Diet Order            Diet heart healthy/carb modified Room service appropriate? Yes; Fluid consistency: Thin  Diet effective now                  Inpatient Medications Scheduled Meds: . aerochamber plus with mask  1 each Other Once  . albuterol  2 puff Inhalation TID  . amLODipine  10 mg Oral Daily  . apixaban  10 mg Oral BID   Followed by  . [START ON 08/14/2020] apixaban  5 mg Oral BID  . vitamin C  500 mg Oral Daily  . insulin aspart  0-15 Units Subcutaneous TID WC  . insulin aspart  0-5 Units Subcutaneous QHS  . insulin glargine  5 Units Subcutaneous Daily  . levothyroxine  137 mcg Oral QAC breakfast  . losartan  100 mg Oral Daily  . pantoprazole  40 mg Oral Daily  . predniSONE  20 mg Oral Q breakfast  . sertraline  100 mg Oral Daily  . umeclidinium bromide  1 puff Inhalation Daily  . zinc sulfate  220 mg Oral Daily  Continuous Infusions: . sodium chloride     PRN Meds:.sodium chloride, acetaminophen, albuterol, ALPRAZolam, chlorpheniramine-HYDROcodone, guaiFENesin-dextromethorphan, [DISCONTINUED] ondansetron **OR** ondansetron (ZOFRAN) IV  Antibiotics  :   Anti-infectives (From admission, onward)   Start     Dose/Rate Route Frequency Ordered Stop   08/07/20 1000  remdesivir 100 mg in sodium chloride 0.9 % 100 mL IVPB       "Followed by" Linked Group Details   100 mg 200 mL/hr over 30 Minutes Intravenous Daily 08/06/20 1449 08/10/20  0913   08/07/20 1000  remdesivir 100 mg in sodium chloride 0.9 % 100 mL IVPB  Status:  Discontinued       "Followed by" Linked Group Details   100 mg 200 mL/hr over 30 Minutes Intravenous Daily 08/06/20 1503 08/06/20 1506   08/06/20 1700  remdesivir 200 mg in sodium chloride 0.9% 250 mL IVPB       "Followed by" Linked Group Details   200 mg 580 mL/hr over 30 Minutes Intravenous Once 08/06/20 1449 08/07/20 0345   08/06/20 1515  remdesivir 200 mg in sodium chloride 0.9% 250 mL IVPB  Status:  Discontinued       "Followed by" Linked Group Details   200 mg 580 mL/hr over 30 Minutes Intravenous Once 08/06/20 1503 08/06/20 1506          Objective:   Vitals:   08/12/20 0000 08/12/20 0400 08/12/20 0734 08/12/20 1146  BP: 140/76 125/76 122/69   Pulse: 73 73    Resp: 20 16 19    Temp: 98.3 F (36.8 C) 97.9 F (36.6 C) 97.6 F (36.4 C) 97.6 F (36.4 C)  TempSrc: Oral Axillary Oral Oral  SpO2: 97% 95%    Weight:      Height:        SpO2: 95 % O2 Flow Rate (L/min): 3 L/min  Wt Readings from Last 3 Encounters:  08/06/20 95.3 kg  07/27/20 81.6 kg  10/24/18 103.1 kg     Intake/Output Summary (Last 24 hours) at 08/12/2020 1237 Last data filed at 08/12/2020 1007 Gross per 24 hour  Intake 480 ml  Output --  Net 480 ml     Physical Exam  Awake, alert, no apparent distress Good air entry bilaterally Regular rate and rhythm Abdomen soft Extremities with no cyanosis, clubbing or edema      Pressure Injury 07/10/20 Buttocks Left Stage 2 -  Partial thickness loss of dermis presenting as a shallow open injury with a red, pink wound bed without slough. (Active)  07/10/20 2330  Location: Buttocks  Location Orientation: Left  Staging: Stage 2 -  Partial thickness loss of dermis presenting as a shallow open injury with a red, pink wound bed without slough.  Wound Description (Comments):   Present on Admission: Yes     Pressure Injury 07/11/20 Buttocks Left Stage 2 -   Partial thickness loss of dermis presenting as a shallow open injury with a red, pink wound bed without slough. (Active)  07/11/20 2330  Location: Buttocks  Location Orientation: Left  Staging: Stage 2 -  Partial thickness loss of dermis presenting as a shallow open injury with a red, pink wound bed without slough.  Wound Description (Comments):   Present on Admission: Yes     Data Review:   Recent Labs  Lab 08/06/20 1511 08/07/20 0248 08/08/20 0403 08/10/20 0459 08/11/20 0059  WBC 3.9* 2.2* 2.4* 3.4* 4.4  HGB 9.8* 10.4* 9.2* 9.7* 10.4*  HCT 34.4* 34.8* 31.8* 33.3* 36.1  PLT 245  262 251 246 270  MCV 78.5* 77.9* 77.0* 78.0* 79.7*  MCH 22.4* 23.3* 22.3* 22.7* 23.0*  MCHC 28.5* 29.9* 28.9* 29.1* 28.8*  RDW Not Measured Not Measured Not Measured Not Measured 30.1*  LYMPHSABS 1.1 0.7 1.1 1.9 2.0  MONOABS 0.4 0.1 0.3 0.4 0.4  EOSABS 0.0 0.0 0.0 0.0 0.0  BASOSABS 0.0 0.0 0.0 0.0 0.0    Recent Labs  Lab 08/06/20 1305 08/06/20 1305 08/06/20 1435 08/06/20 1456 08/06/20 1503 08/06/20 1509 08/07/20 0248 08/07/20 0814 08/08/20 0403 08/08/20 1031 08/09/20 0300 08/10/20 0459 08/11/20 0059  NA 128*   < >  --    < >  --   --  131*  --  133*  --  134* 133* 133*  K 3.9   < >  --    < >  --   --  3.9  --  4.0  --  3.9 3.1* 3.9  CL 89*   < >  --   --   --   --  92*  --  92*  --  94* 92* 95*  CO2 28   < >  --   --   --   --  30  --  33*  --  31 30 29   GLUCOSE 89   < >  --   --   --   --  127*  --  101*  --  95 81 80  BUN 7*   < >  --   --   --   --  6*  --  10  --  9 10 10   CREATININE 0.47   < >  --   --   --   --  0.50  --  0.52  --  0.46 0.43* 0.41*  CALCIUM 8.4*   < >  --   --   --   --  8.7*  --  8.5*  --  8.6* 8.7* 8.8*  AST 21   < >  --   --   --   --  18  --  19  --  19 19 32  ALT 13   < >  --   --   --   --  15  --  14  --  16 20 40  ALKPHOS 52   < >  --   --   --   --  46  --  42  --  43 39 48  BILITOT 0.5   < >  --   --   --   --  0.3  --  0.5  --  0.4 0.5 0.5  ALBUMIN  3.0*   < >  --   --   --   --  2.9*  --  2.9*  --  2.9* 3.0* 3.2*  MG  --   --   --   --   --   --  1.9  --  1.9  --  1.9 1.8 1.9  CRP 5.5*   < >  --   --   --   --  6.6*  --  3.0*  --  1.6* 1.0* 1.1*  DDIMER 5.89*  --   --   --   --   --  5.09*  --   --   --   --   --   --   PROCALCITON <0.10  --   --   --   --   --   --   --   --   --   --   --   --  LATICACIDVEN 0.9  --  1.1  --   --   --   --   --   --   --   --   --   --   INR  --   --   --   --   --  1.2 1.2  --  1.9*  --  1.8*  --   --   TSH  --   --   --   --   --   --   --   --   --  1.275  --   --   --   HGBA1C  --   --   --   --   --   --   --  4.7*  --   --   --   --   --   BNP  --   --   --   --  23.3  --  38.8  --  29.6  --   --  43.8 36.8   < > = values in this interval not displayed.    Recent Labs  Lab 08/06/20 1305 08/06/20 1305 08/06/20 1503 08/07/20 0248 08/08/20 0403 08/09/20 0300 08/10/20 0459 08/11/20 0059  CRP 5.5*   < >  --  6.6* 3.0* 1.6* 1.0* 1.1*  DDIMER 5.89*  --   --  5.09*  --   --   --   --   BNP  --   --  23.3 38.8 29.6  --  43.8 36.8  PROCALCITON <0.10  --   --   --   --   --   --   --   SARSCOV2NAA POSITIVE*  --   --   --   --   --   --   --    < > = values in this interval not displayed.    ------------------------------------------------------------------------------------------------------------------ No results for input(s): CHOL, HDL, LDLCALC, TRIG, CHOLHDL, LDLDIRECT in the last 72 hours.  Lab Results  Component Value Date   HGBA1C 4.7 (L) 08/07/2020   ------------------------------------------------------------------------------------------------------------------ No results for input(s): TSH, T4TOTAL, T3FREE, THYROIDAB in the last 72 hours.  Invalid input(s): FREET3 ------------------------------------------------------------------------------------------------------------------ No results for input(s): VITAMINB12, FOLATE, FERRITIN, TIBC, IRON, RETICCTPCT in the last 72  hours.  Coagulation profile Recent Labs  Lab 08/06/20 1509 08/07/20 0248 08/08/20 0403 08/09/20 0300  INR 1.2 1.2 1.9* 1.8*    No results for input(s): DDIMER in the last 72 hours.  Cardiac Enzymes No results for input(s): CKMB, TROPONINI, MYOGLOBIN in the last 168 hours.  Invalid input(s): CK ------------------------------------------------------------------------------------------------------------------    Component Value Date/Time   BNP 36.8 08/11/2020 0059    Micro Results Recent Results (from the past 240 hour(s))  Blood Culture (routine x 2)     Status: None   Collection Time: 08/06/20  1:00 PM   Specimen: BLOOD  Result Value Ref Range Status   Specimen Description BLOOD RIGHT ANTECUBITAL  Final   Special Requests   Final    BOTTLES DRAWN AEROBIC AND ANAEROBIC Blood Culture results may not be optimal due to an inadequate volume of blood received in culture bottles   Culture   Final    NO GROWTH 5 DAYS Performed at [facility], [place] [address] [place], [place] [address]    Report Status 08/11/2020 FINAL  Final  Respiratory Panel by RT PCR (Flu A&B, Covid) - Nasopharyngeal Swab     Status: Abnormal   Collection Time:  08/06/20  1:05 PM   Specimen: Nasopharyngeal Swab  Result Value Ref Range Status   SARS Coronavirus 2 by RT PCR POSITIVE (A) NEGATIVE Final    Comment: emailed L. Berdik RN 14:40 08/06/20 ([email or web address]) (NOTE) SARS-CoV-2 target nucleic acids are DETECTED.  SARS-CoV-2 RNA is generally detectable in upper respiratory specimens  during the acute phase of infection. Positive results are indicative of the presence of the identified virus, but do not rule out bacterial infection or co-infection with other pathogens not detected by the test. Clinical correlation with patient history and other diagnostic information is necessary to determine patient infection status. The expected result is Negative.  Fact Sheet for Patients:   PinkCheek.be  Fact Sheet for Healthcare Providers: GravelBags.it  This test is not yet approved or cleared by the [place] FDA and  has been authorized for detection and/or diagnosis of SARS-CoV-2 by FDA under an Emergency Use Authorization (EUA).  This EUA will remain in effect (meaning this test can be used) for the duration of  the COVID-19  declaration under Section 564(b)(1) of the Act, 21 U.S.C. section 360bbb-3(b)(1), unless the authorization is terminated or revoked sooner.      Influenza A by PCR NEGATIVE NEGATIVE Final   Influenza B by PCR NEGATIVE NEGATIVE Final    Comment: (NOTE) The Xpert Xpress SARS-CoV-2/FLU/RSV assay is intended as an aid in  the diagnosis of influenza from Nasopharyngeal swab specimens and  should not be used as a sole basis for treatment. Nasal washings and  aspirates are unacceptable for Xpert Xpress SARS-CoV-2/FLU/RSV  testing.  Fact Sheet for Patients: PinkCheek.be  Fact Sheet for Healthcare Providers: GravelBags.it  This test is not yet approved or cleared by the [place] FDA and  has been authorized for detection and/or diagnosis of SARS-CoV-2 by  FDA under an Emergency Use Authorization (EUA). This EUA will remain  in effect (meaning this test can be used) for the duration of the  Covid-19 declaration under Section 564(b)(1) of the Act, 21  U.S.C. section 360bbb-3(b)(1), unless the authorization is  terminated or revoked. Performed at [facility], [place] [address], [place], [place] [address]   Blood Culture (routine x 2)     Status: None   Collection Time: 08/06/20  1:22 PM   Specimen: BLOOD RIGHT WRIST  Result Value Ref Range Status   Specimen Description BLOOD RIGHT WRIST  Final   Special Requests   Final    BOTTLES DRAWN AEROBIC AND ANAEROBIC Blood Culture results may not be optimal due to an  inadequate volume of blood received in culture bottles   Culture   Final    NO GROWTH 5 DAYS Performed at [facility], [place] [address], [place], [place] [address]    Report Status 08/11/2020 FINAL  Final  MRSA PCR Screening     Status: None   Collection Time: 08/07/20  6:23 AM   Specimen: Nasal Mucosa; Nasopharyngeal  Result Value Ref Range Status   MRSA by PCR NEGATIVE NEGATIVE Final    Comment:        The GeneXpert MRSA Assay (FDA approved for NASAL specimens only), is one component of a comprehensive MRSA colonization surveillance program. It is not intended to diagnose MRSA infection nor to guide or monitor treatment for MRSA infections. Performed at [facility], [place] [address], [place], [place] [address]     Radiology Reports DG Chest 2 View  Result Date: 07/26/2020 CLINICAL DATA:  Left pleural effusion  EXAM: CHEST - 2 VIEW COMPARISON:  07/24/2020 FINDINGS: Small left pleural effusion is increased. Left basilar atelectasis is present. No pneumothorax. Background changes of emphysema. Stable cardiomediastinal contours. IMPRESSION: Small left pleural effusion increased since 07/24/2020. Mild left basilar atelectasis. Electronically Signed   By: Macy Mis M.D.   On: 07/26/2020 08:07   CT CHEST WO CONTRAST  Result Date: 07/23/2020 CLINICAL DATA:  Abnormal radiograph, pleural effusion EXAM: CT CHEST WITHOUT CONTRAST TECHNIQUE: Multidetector CT imaging of the chest was performed following the standard protocol without IV contrast. COMPARISON:  Radiograph 07/21/2020, CT 07/10/2020 FINDINGS: Cardiovascular: Cardiac size within normal limits. Small volume pericardial fluid is similar to prior. Dense calcifications along the mitral annulus. Additional calcifications of the aortic leaflets. Hypoattenuation of the cardiac blood pool compatible with anemia. Extensive three-vessel native coronary artery atherosclerosis. Atherosclerotic plaque within the normal caliber aorta.  No hyperdense mural thickening or plaque displacement nor periaortic stranding or hemorrhage. Shared origin of the brachiocephalic and left common carotid arteries. Additional calcifications seen throughout the proximal great vessels. Central pulmonary arteries are normal caliber. Luminal evaluation precluded in the absence of contrast media. No visible worrisome venous abnormalities. Mediastinum/Nodes: Persistent slight leftward mediastinal shift, similar to comparison. No mediastinal fluid, gas or hemorrhage. Redemonstration of multiple asymmetrically prominent though non pathologically enlarged left axillary and mediastinal lymph nodes. No right axillary adenopathy. Hilar nodal is severely limited in the absence of contrast media. No acute abnormality of the trachea or esophagus. Thyroid gland is diminutive, correlate for prior thyroidectomy or ablation. Lungs/Pleura: Background of moderate centrilobular emphysematous changes throughout both lungs. There is a moderate residual left pleural effusion with adjacent areas of passive atelectasis in the left lung base. Some additional thickening is noted along the left major fissure which may reflect some persistent loculated fluid. Adjacent atelectatic changes are present along the left major fissure as well. Predominantly involving the lingula and posterior segment left upper lobe. Underlying consolidation would be difficult to exclude. Some there is a stable sub solid opacity in the posterosuperior segment right lower lobe (4/74 measuring up to 8 mm. Possibly post infectious or inflammatory. Stable scarring within the anterior basal segment right lower lobe and major fissure. Right lung is otherwise clear. Upper Abdomen: Multiple calcified gallstones within the largely decompressed gallbladder. Scattered colonic diverticula without focal inflammation to suggest diverticulitis. Extensive upper abdominal atherosclerotic calcification. Stable likely parapelvic cyst in  the upper pole left kidney. Musculoskeletal: Mild body wall edema, left slightly more pronounced than right. No acute abnormality or worrisome osseous lesions. Levocurvature of the upper thoracic spine with some mild dextrocurvature of the mid to lower levels. Multilevel discogenic and facet degenerative changes are present. Additional degenerative changes of the right shoulder and sternoclavicular joints similar to prior. IMPRESSION: 1. Moderate residual left pleural effusion with adjacent areas of passive atelectasis in the left lung base. Some additional thickening along the left major fissure may reflect some persistent loculated fluid with some further atelectatic change in the lingula as well. Underlying consolidation within the atelectatic lung would cannot be fully excluded. 2. Stable appearance of an 8 mm ground-glass opacity in the posterosuperior segment right lower lobe. Possibly post infectious or inflammatory. Recommend attention on follow-up imaging. Initial follow-up with CT at 6-12 months is recommended to confirm persistence. If persistent, repeat CT is recommended every 2 years until 5 years of stability has been established. This recommendation follows the consensus statement: Guidelines for Management of Incidental Pulmonary Nodules Detected on CT Images: From the [employer]  2017; Radiology 2017; 284:228-243. 3. Redemonstration of multiple asymmetrically prominent though non pathologically enlarged left axillary and mediastinal lymph nodes, nonspecific though possibly reactive or edematous. 4. Cholelithiasis without evidence of acute cholecystitis. 5. Colonic diverticulosis without evidence of diverticulitis. 6. Aortic Atherosclerosis (ICD10-I70.0) 7. Three-vessel coronary artery calcifications. 8. Emphysema (ICD10-J43.9). Electronically Signed   By: Lovena Le M.D.   On: 07/23/2020 22:26   CT ANGIO CHEST PE W OR WO CONTRAST  Result Date: 08/07/2020 CLINICAL DATA:  PE suspected,  established right-sided PE, increased shortness of breath, assess embolus burden EXAM: CT ANGIOGRAPHY CHEST WITH CONTRAST TECHNIQUE: Multidetector CT imaging of the chest was performed using the standard protocol during bolus administration of intravenous contrast. Multiplanar CT image reconstructions and MIPs were obtained to evaluate the vascular anatomy. CONTRAST:  70mL OMNIPAQUE IOHEXOL 350 MG/ML SOLN COMPARISON:  None. FINDINGS: Cardiovascular: Examination for pulmonary embolism is somewhat limited by breath motion artifact. Within this limitation, there are occasional probable subsegmental emboli, for example in the dependent right lower lobe (series 6, image 76) and in the dependent left lower lobe (series 6, image 63). There is no segmental or more central emboli identified. Aortic atherosclerosis. Normal heart size. Three-vessel coronary artery calcifications. Enlargement of the main pulmonary artery measuring up to 3.6 cm in caliber. Small pericardial effusion. Mediastinum/Nodes: No enlarged mediastinal, hilar, or axillary lymph nodes. Thyroid gland, trachea, and esophagus demonstrate no significant findings. Lungs/Pleura: Moderate centrilobular emphysema. Moderate left pleural effusion and associated atelectasis or consolidation, similar to prior examination. Upper Abdomen: No acute abnormality. Musculoskeletal: No chest wall abnormality. No acute or significant osseous findings. Review of the MIP images confirms the above findings. IMPRESSION: 1. Examination for pulmonary embolism is somewhat limited by breath motion artifact. Within this limitation, there are occasional probable subsegmental emboli present bilaterally. No segmental or more central embolus identified. 2. Moderate left pleural effusion and associated atelectasis or consolidation, similar to prior examination. 3. Emphysema (ICD10-J43.9). 4. Coronary artery disease. Aortic Atherosclerosis (ICD10-I70.0) 5. Enlargement of the main pulmonary  artery measuring up to 3.6 cm in caliber, as can be seen in pulmonary hypertension. Electronically Signed   By: Eddie Candle M.D.   On: 08/07/2020 15:33   DG Chest Port 1 View  Result Date: 08/06/2020 CLINICAL DATA:  COVID-19 positive with shortness of breath EXAM: PORTABLE CHEST 1 VIEW COMPARISON:  July 26, 2020 FINDINGS: There is left lower lobe airspace opacity with small left pleural effusion. The lungs elsewhere are clear. The heart is upper normal in size with pulmonary vascularity normal. No adenopathy. There is aortic atherosclerosis. No bone lesions. IMPRESSION: Small left pleural effusion. Airspace opacity in the left lower lobe concerning for pneumonia with likely associated atelectasis. Lungs elsewhere clear. Stable cardiac silhouette. Aortic Atherosclerosis (ICD10-I70.0). Electronically Signed   By: Lowella Grip III M.D.   On: 08/06/2020 13:31   DG CHEST PORT 1 VIEW  Result Date: 07/24/2020 CLINICAL DATA:  Status post left thoracentesis EXAM: PORTABLE CHEST 1 VIEW COMPARISON:  07/21/2020 FINDINGS: Trace left pleural effusion, improved status post thoracentesis. No pneumothorax is seen. Right lung is essentially clear. The heart is normal in size.  Thoracic aortic atherosclerosis. IMPRESSION: Trace left pleural effusion, improved status post thoracentesis. No pneumothorax is seen. Electronically Signed   By: Julian Hy M.D.   On: 07/24/2020 13:01   DG Chest Port 1 View  Result Date: 07/21/2020 CLINICAL DATA:  Shortness of breath EXAM: PORTABLE CHEST 1 VIEW COMPARISON:  July 15, 2020 FINDINGS: There is consolidation throughout the  left mid and lower lung regions with questionable superimposed left pleural effusion. The right lung is clear. Heart is upper normal in size with pulmonary vascularity normal. No adenopathy. There is aortic atherosclerosis. No bone lesions. IMPRESSION: Consolidation throughout much of the left mid and lower lung regions with suspected small  pleural effusions superimposed on the left. Right lung clear. Heart upper normal in size. No adenopathy. Aortic Atherosclerosis (ICD10-I70.0). Electronically Signed   By: Lowella Grip III M.D.   On: 07/21/2020 14:53   DG Chest Port 1 View  Result Date: 07/15/2020 CLINICAL DATA:  Left chest tube EXAM: PORTABLE CHEST 1 VIEW COMPARISON:  07/14/2020 FINDINGS: Slight retraction of the left small bore chest tube. No significant pneumothorax by plain radiography. Persistent left basilar collapse/consolidation. No enlarging effusion. Stable right lung aeration. Heart remains enlarged. Aorta atherosclerotic.  Degenerative changes noted of the spine. IMPRESSION: Slight retraction of the left chest tube. No current significant pneumothorax by plain radiography Persistent left lower lung collapse/consolidation. Aortic Atherosclerosis (ICD10-I70.0). Electronically Signed   By: Jerilynn Mages.  Shick M.D.   On: 07/15/2020 08:00   DG Chest Port 1 View  Result Date: 07/14/2020 CLINICAL DATA:  Chest tube present EXAM: PORTABLE CHEST 1 VIEW COMPARISON:  July 14, 2020 study obtained earlier in the day. FINDINGS: Chest tube on the left is unchanged in position. No evident pneumothorax. There is bibasilar atelectatic change, somewhat more on the left than on the right, stable. No new opacity evident. Heart is upper normal in size with pulmonary vascularity normal. No adenopathy. There is aortic atherosclerosis. No bone lesions. There is bilateral carotid artery calcification. IMPRESSION: Chest tube unchanged on the left without evident pneumothorax. Lower lung region atelectasis is stable. No new opacity. Stable cardiac silhouette. Aortic Atherosclerosis (ICD10-I70.0). There is bilateral carotid artery calcification. Electronically Signed   By: Lowella Grip III M.D.   On: 07/14/2020 13:25   DG Chest Port 1 View  Result Date: 07/14/2020 CLINICAL DATA:  Chest tube surveillance EXAM: PORTABLE CHEST 1 VIEW COMPARISON:   07/13/2020 FINDINGS: Left-sided chest tube remains in place. Stable cardiomegaly. Aortic atherosclerosis. Suspect small residual left-sided pleural effusion. Persistent bibasilar atelectasis. No pneumothorax is seen. IMPRESSION: 1. Left-sided chest tube remains in place. No pneumothorax. 2. Persistent bibasilar atelectasis and probable small residual left pleural effusion. Electronically Signed   By: Davina Poke D.O.   On: 07/14/2020 08:18    Phillips Climes M.D on 08/12/2020 at 12:37 PM  To page go to [email or web address]

## 2020-08-12 NOTE — Progress Notes (Addendum)
Occupational Therapy Treatment Patient Details Name: Jaime Kelley MRN: [id number] DOB: 21-Jul-1951 Today's Date: 08/12/2020    History of present illness Pt is a 69 y.o. female with recent admission 07/10/20-07/27/20 with large L-side pleural effusion with d/c to SNF, now readmitted from [facility] 08/06/20 with hypoxia; pt had tested (+) COVID-19 on 10/14. Workup for COVID-19 PNA; chronic hypoxemic respiratory failure. PMH includes COPD (5L O2 baseline), RA, PE/DVT on Coumadin, DM2, HTN, obesity, depression/anxiety; pt vaccinated 10/5.   OT comments  Pt progressing towards established OT goals. Pt continues to present with decreased balance, strength, cognition, and activity tolerance. Pt requiring increased time and cues due to decreased awareness, problem solving, and attention. Pt agreeable to attempt OOB to recliner and simulate transfer to w/c. Pt requiring Mod A +2 for lateral scoot to recliner with R drop arm down. VSS on 3L. Continue to recommend dc to SNF and will continue to follow acutely as admitted.    Follow Up Recommendations  SNF    Equipment Recommendations  Other (comment) (Defer to next venue)    Recommendations for Other Services      Precautions / Restrictions Precautions Precautions: Fall;Other (comment) Restrictions Weight Bearing Restrictions: No       Mobility Bed Mobility Overal bed mobility: Needs Assistance Bed Mobility: Sit to Supine       Sit to supine: Min assist   General bed mobility comments: to left side with assist for lifting legs, pt then able to roll onto her back/supine  Transfers Overall transfer level: Needs assistance   Transfers: Lateral/Scoot Transfers          Lateral/Scoot Transfers: Mod assist;+2 physical assistance General transfer comment: Mod A +2 for weight shifting and then scooting hips with bed pad.     Balance Overall balance assessment: Needs assistance Sitting-balance support: Feet supported;No upper  extremity supported Sitting balance-[name]: Fair                                     ADL either performed or assessed with clinical judgement   ADL Overall ADL's : Needs assistance/impaired Eating/Feeding: Set up;Sitting                       Toilet Transfer: Moderate assistance;+2 for physical assistance;+2 for safety/equipment;Transfer board Toilet Transfer Details (indicate cue type and reason): Lateral scoot to drop arm recliner with Mod A +2 for weight shifting and then faciltiating hip movements with pad.          Functional mobility during ADLs: Moderate assistance;+2 for physical assistance;+2 for safety/equipment (lateral scoot) General ADL Comments: Pt presenting with decreased balance, strength, cognition, and activity tolerance. Pt also limited by increased anxiety     Vision       Perception     Praxis      Cognition Arousal/Alertness: Awake/alert Behavior During Therapy: Flat affect;Anxious Overall Cognitive Status: No family/caregiver present to determine baseline cognitive functioning Area of Impairment: Following commands;Safety/judgement;Awareness;Problem solving;Attention;Memory                   Current Attention Level: Sustained;Selective Memory: Decreased short-term memory Following Commands: Follows one step commands with increased time Safety/Judgement: Decreased awareness of safety;Decreased awareness of deficits Awareness: Intellectual Problem Solving: Slow processing;Decreased initiation;Difficulty sequencing;Requires verbal cues;Requires tactile cues General Comments: Easily distracted and has larger reactions to certain topics or information. Requiring increased time. Abel to  follow simple commands. Decreased problem solving and awareness; difficulty understanding how OOB activities will assist in the furture. When asking pt how she gets out of bed at home to her wheelchair, pt stating "I just jump out of bed."  Pt also reporting increased anxiety with OOB activity        Exercises     Shoulder Instructions       General Comments On arrival: sidelying 3L sats 86-92%; sitting EOB 3L 94-97%;     Pertinent Vitals/ Pain       Pain Assessment: Faces Faces Pain Scale: Hurts a little bit Pain Location: L knee Pain Descriptors / Indicators: Discomfort;Grimacing;Guarding;Tender Pain Intervention(s): Monitored during session;Limited activity within patient's tolerance;Repositioned  Home Living                                          Prior Functioning/Environment              Frequency  Min 2X/week        Progress Toward Goals  OT Goals(current goals can now be found in the care plan section)  Progress towards OT goals: Progressing toward goals  Acute Rehab OT Goals Patient Stated Goal: "I want to get back to everything I was doing before" (including cooking, baking) OT Goal Formulation: With patient Time For Goal Achievement: 08/21/20 Potential to Achieve Goals: Fair ADL Goals Pt Will Perform Grooming: with set-up;sitting Pt Will Perform Upper Body Bathing: with set-up;sitting Pt Will Perform Lower Body Bathing: with min assist;bed level Pt Will Transfer to Toilet: with max assist;squat pivot transfer;bedside commode Pt Will Perform Toileting - Clothing Manipulation and hygiene: with max assist;sitting/lateral leans Pt/caregiver will Perform Home Exercise Program: Right Upper extremity;Left upper extremity;Independently;With written HEP provided Additional ADL Goal #1: Pt will perform bed mobility at min A prior to engaging in ADL  Plan Discharge plan remains appropriate    Co-evaluation    PT/OT/SLP Co-Evaluation/Treatment: Yes Reason for Co-Treatment: Necessary to address cognition/behavior during functional activity;For patient/therapist safety;To address functional/ADL transfers   OT goals addressed during session: ADL's and self-care      AM-PAC  OT "6 Clicks" Daily Activity     Outcome Measure   Help from another person eating meals?: A Little Help from another person taking care of personal grooming?: A Little Help from another person toileting, which includes using toliet, bedpan, or urinal?: Total Help from another person bathing (including washing, rinsing, drying)?: Total Help from another person to put on and taking off regular upper body clothing?: A Lot Help from another person to put on and taking off regular lower body clothing?: Total 6 Click Score: 11    End of Session Equipment Utilized During Treatment: Oxygen (3L)  OT Visit Diagnosis: Unsteadiness on feet (R26.81);Other abnormalities of gait and mobility (R26.89);Muscle weakness (generalized) (M62.81);Adult, failure to thrive (R62.7) Pain - Right/Left: Left Pain - part of body: Knee   Activity Tolerance Patient limited by fatigue   Patient Left in chair;with call bell/phone within reach;with chair alarm set   Nurse Communication Mobility status        Time: 9518-8416 OT Time Calculation (min): 38 min  Charges: OT General Charges $OT Visit: 1 Visit OT Treatments $Self Care/Home Management : 8-22 mins $Therapeutic Activity: 8-22 mins  [place], OTR/L Acute Rehab Pager: [phone] Office: [place] 08/12/2020, 12:57 PM

## 2020-08-12 NOTE — Progress Notes (Signed)
Physical Therapy Treatment Patient Details Name: Jaime Kelley MRN: [id number] DOB: 1951-08-21 Today's Date: 08/12/2020    History of Present Illness Pt is a 69 y.o. female with recent admission 07/10/20-07/27/20 with large L-side pleural effusion with d/c to SNF, now readmitted from [facility] 08/06/20 with hypoxia; pt had tested (+) COVID-19 on 10/14. Workup for COVID-19 PNA; chronic hypoxemic respiratory failure. PMH includes COPD (5L O2 baseline), RA, PE/DVT on Coumadin, DM2, HTN, obesity, depression/anxiety; pt vaccinated 10/5.    PT Comments    Nursing requested assist with getting pt back to bed due to no maximove pads on the unit and pt becoming agitated while waiting. Patient is a difficult transfer due to bil knee pain and unwillingness/inability to bear weight on her legs. Nursing had tried to use the stedy lift without success. Repeated lateral scoot recliner to bed with dropped-arm recliner (towards pt left) and using bed pad under her hips and +2 mod assist.     Follow Up Recommendations  SNF;Supervision/Assistance - 24 hour     Equipment Recommendations   (defer)    Recommendations for Other Services       Precautions / Restrictions Precautions Precautions: Fall;Other (comment) Restrictions Weight Bearing Restrictions: No    Mobility  Bed Mobility Overal bed mobility: Needs Assistance Bed Mobility: Sit to Supine Rolling: Min assist   Supine to sit: Mod assist;HOB elevated Sit to supine: Min assist   General bed mobility comments: to left side with assist for lifting legs, pt then able to roll onto her back/supine  Transfers Overall transfer level: Needs assistance   Transfers: Lateral/Scoot Transfers          Lateral/Scoot Transfers: Mod assist;+2 physical assistance (recliner to bed; heavier +2 mod assist than earlier) General transfer comment: pt assisted in scooting with bil UEs and leaning torso forward to unweight hips  Ambulation/Gait              General Gait Details: refuses to bear weight due to knee pain   Stairs             Wheelchair Mobility    Modified Rankin (Stroke Patients Only)       Balance Overall balance assessment: Needs assistance Sitting-balance support: Feet supported;No upper extremity supported Sitting balance-[name]: Fair                                      Cognition Arousal/Alertness: Awake/alert Behavior During Therapy: Flat affect Overall Cognitive Status: No family/caregiver present to determine baseline cognitive functioning Area of Impairment: Following commands;Safety/judgement;Awareness;Problem solving;Attention                   Current Attention Level: Selective   Following Commands: Follows one step commands with increased time Safety/Judgement: Decreased awareness of safety;Decreased awareness of deficits Awareness: Intellectual Problem Solving: Slow processing;Decreased initiation;Difficulty sequencing;Requires verbal cues;Requires tactile cues General Comments: able to engage in conversation to keep her engaged in session      Exercises      General Comments General comments (skin integrity, edema, etc.): Nursing attempting to get pt back to bed with stedy with no success. Nursing requested assist as there are no maximove pads on the unit. Patient able to laterally scoot to bed using pad under her hips. Elevated into chair-like position at end of session      Pertinent Vitals/Pain Pain Assessment: Faces Faces Pain Scale: Hurts a little  bit Pain Location: L knee Pain Descriptors / Indicators: Discomfort;Grimacing;Guarding;Tender Pain Intervention(s): Limited activity within patient's tolerance;Monitored during session;Repositioned    Home Living                      Prior Function            PT Goals (current goals can now be found in the care plan section) Acute Rehab PT Goals Patient Stated Goal: "I want to get  back to everything I was doing before" (including cooking, baking) Time For Goal Achievement: 08/21/20 Potential to Achieve Goals: Poor Progress towards PT goals: Progressing toward goals    Frequency    Min 2X/week      PT Plan Current plan remains appropriate    Co-evaluation PT/OT/SLP Co-Evaluation/Treatment: Yes Reason for Co-Treatment: Necessary to address cognition/behavior during functional activity;For patient/therapist safety PT goals addressed during session: Mobility/safety with mobility;Balance        AM-PAC PT "6 Clicks" Mobility   Outcome Measure  Help needed turning from your back to your side while in a flat bed without using bedrails?: A Little Help needed moving from lying on your back to sitting on the side of a flat bed without using bedrails?: A Lot Help needed moving to and from a bed to a chair (including a wheelchair)?: Total Help needed standing up from a chair using your arms (e.g., wheelchair or bedside chair)?: Total Help needed to walk in hospital room?: Total Help needed climbing 3-5 steps with a railing? : Total 6 Click Score: 9    End of Session Equipment Utilized During Treatment: Oxygen Activity Tolerance: Patient limited by pain (left knee worse than rt knee) Patient left: with call bell/phone within reach;in bed;with bed alarm set;with nursing/sitter in room Nurse Communication: Mobility status;Need for lift equipment PT Visit Diagnosis: Muscle weakness (generalized) (M62.81)     Time: 0174-9449 PT Time Calculation (min) (ACUTE ONLY): 22 min  Charges:  $Therapeutic Activity: 8-22 mins                      Arby Barrette, PT Pager [phone]    Rexanne Mano 08/12/2020, 12:05 PM

## 2020-08-13 DIAGNOSIS — J1282 Pneumonia due to coronavirus disease 2019: Secondary | ICD-10-CM | POA: Diagnosis not present

## 2020-08-13 DIAGNOSIS — U071 COVID-19: Secondary | ICD-10-CM | POA: Diagnosis not present

## 2020-08-13 LAB — GLUCOSE, CAPILLARY
Glucose-Capillary: 87 mg/dL (ref 70–99)
Glucose-Capillary: 92 mg/dL (ref 70–99)
Glucose-Capillary: 96 mg/dL (ref 70–99)
Glucose-Capillary: 119 mg/dL — ABNORMAL HIGH (ref 70–99)

## 2020-08-13 MED ORDER — DEXAMETHASONE 6 MG PO TABS
6.0000 mg | ORAL_TABLET | Freq: Every day | ORAL | Status: DC
  Administered 2020-08-13 – 2020-08-16 (×4): 6 mg via ORAL
  Filled 2020-08-13 (×4): qty 1
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
## 2020-08-13 NOTE — Progress Notes (Signed)
PROGRESS NOTE                                                                                                                                                                                                             Patient Demographics:    Jaime Kelley, is a 69 y.o. female, DOB - [birth year], SRP:[id number]  Admit date - 08/06/2020   Admitting Physician Mckinley Jewel, MD  Outpatient Primary MD for the patient is Laurey Morale, MD  LOS - 7  Chief Complaint  Patient presents with  . Shortness of Breath    COVID +       Brief Narrative (HPI from H&P)  - Jaime Kelley is a 69 y.o. female with medical history significant of hypertension, type 2 diabetes mellitus, morbid obesity, depression/anxiety, chronic hypoxemic respiratory failure secondary to underlying COPD-on 5 L of oxygen at baseline, RA, hypothyroidism, history of PE/DVT-on Coumadin, GERD presents to emergency department for evaluation of hypoxia.  She was recently admitted on 9/18 with complaints of substernal chest pressure and was found to have large left-sided pleural effusion with complete collapse of left lung requiring nonrebreather initially.  Patient underwent left pleural space drainage and chest tube placement which was removed on 9/23.  Bronchoscopy was performed on 9/20 with removal of solitary mucous plug.  Repeat chest x-ray showed recurrent opacification.  Pulmonology performed a repeat thoracentesis at bedside on 07/24/2020.  Chest x-ray on 10/5 demonstrated a small amount of reaccumulation of pleural effusion however oxygen requirements improved to 3 L therefore she discharged to SNF in a stable condition and received [place] Covid vaccine on 10/5.  Work-up here suggestive of acute PE plus minus possible mild Covid pneumonia.    Subjective:   Patient in bed, appears comfortable, denies headache, fever, chest pain .  No significant events overnight as discussed with patient or  staff.  She is confused, thinking daughter was sitting at her bedside earlier today.   Assessment  & Plan :    Acute on chronic hypoxic respiratory failure -Multifactorial, in the setting of baseline COPD. -currently with acute PE.  -she was recently admitted with large left-sided pleural effusion requiring thoracentesis and bronchoscopy for removal of solitary mucous plug.  She also recently received Covid vaccine on 07/27/2020.  She is now here with shortness of breath with minimal left-sided pleural effusion and atelectasis.  Her present acute on chronic hypoxia could be due to mild bronchitis + PE , doubt she has  true COVID-19 pneumonia, for now since started will finish steroids(taper) and Remdesivir, she is also chronically on Coumadin with subtherapeutic INR, she was treated initially with Lovenox, now transition to Eliquis,.  Continue supplemental oxygen note she is on 5 L of nasal cannula oxygen at home.     Recent Labs  Lab 08/06/20 1305 08/06/20 1305 08/06/20 1435 08/06/20 1456 08/06/20 1503 08/06/20 1509 08/06/20 1511 08/07/20 0248 08/08/20 0403 08/09/20 0300 08/10/20 0459 08/11/20 0059  WBC  --   --   --   --   --   --  3.9* 2.2* 2.4*  --  3.4* 4.4  HGB  --   --   --    < >  --   --  9.8* 10.4* 9.2*  --  9.7* 10.4*  HCT  --   --   --    < >  --   --  34.4* 34.8* 31.8*  --  33.3* 36.1  PLT  --   --   --   --   --   --  245 262 251  --  246 270  CRP 5.5*   < >  --   --   --   --   --  6.6* 3.0* 1.6* 1.0* 1.1*  BNP  --   --   --   --  23.3  --   --  38.8 29.6  --  43.8 36.8  DDIMER 5.89*  --   --   --   --   --   --  5.09*  --   --   --   --   PROCALCITON <0.10  --   --   --   --   --   --   --   --   --   --   --   AST 21   < >  --   --   --   --   --  18 19 19 19  32  ALT 13   < >  --   --   --   --   --  15 14 16 20  40  ALKPHOS 52   < >  --   --   --   --   --  46 42 43 39 48  BILITOT 0.5   < >  --   --   --   --   --  0.3 0.5 0.4 0.5 0.5  ALBUMIN 3.0*   < >  --   --    --   --   --  2.9* 2.9* 2.9* 3.0* 3.2*  INR  --   --   --   --   --  1.2  --  1.2 1.9* 1.8*  --   --   LATICACIDVEN 0.9  --  1.1  --   --   --   --   --   --   --   --   --   SARSCOV2NAA POSITIVE*  --   --   --   --   --   --   --   --   --   --   --    < > = values in this interval not displayed.     Underlying COPD with 5 L nasal cannula oxygen at home.   -Supportive care.  Treated with IV steroids, now transition to p.o. prednisone, she is with no wheezing currently  Social/ethics -Patient reports she wants to go home, did not want placement, as well she threatened to leave AMA, though she is awake alert x3 intermittently, she is with some episodes of confusion and delusions intermittently, psychiatry were consulted during previous admission, and this admission as well, for which she has deemed not to have capacity to make her own medical decisions, and placement decisions as well. -Social worker has been consulted, trying to assist with SNF placement. -. Stable TSH, B12 and RPR.  Acute PE with history of DVT. -  Was on Coumadin with subtherapeutic INR, has a new PE, placed on Lovenox >> Eliquis on 08/09/2020.  Anxiety and depression  -on Zoloft and Xanax.  Continue.  Hypothyroidism.  On Synthroid.  Essential hypertension.  On Norvasc and ARB.  Morbid obesity.  BMI 41.  Follow with PCP.   Hypokalemia -Repleted  DM type II.  Low-dose Lantus and sliding scale and monitor.  Lab Results  Component Value Date   HGBA1C 4.7 (L) 08/07/2020   CBG (last 3)  Recent Labs    08/12/20 1639 08/12/20 2022 08/13/20 0811  GLUCAP 88 116* 87      Condition - Extremely Guarded  Family Communication  :    Daughter [phone] - 11/08/19 left voicemail 08/11/2020  Son Simon Rhein - [phone] -11/08/2019, extremely argumentative and rude and slammed the phone down.  Would not let me finish my conversation.  Patient states that son has no right to interfere with her medical decision  making.  Code Status :  Full  Consults  : Psych  Procedures  :    PUD Prophylaxis : PPI  Disposition Plan  :    Status is: Inpatient  Remains inpatient appropriate because:Unsafe d/c plan   Dispo: The patient is from: Home              Anticipated d/c is to: SNF              Anticipated d/c date is: 1 day              Patient currently is medically stable to d/c.  Patient is medically ready for discharge, but she has an safe discharge plan, as she will need SNF placement, social worker trying to arrange that.   DVT Prophylaxis  :  Lovenox >> Coumadin  Lab Results  Component Value Date   INR 1.8 (H) 08/09/2020   INR 1.9 (H) 08/08/2020   INR 1.2 08/07/2020     Lab Results  Component Value Date   PLT 270 08/11/2020    Diet :  Diet Order            Diet heart healthy/carb modified Room service appropriate? Yes; Fluid consistency: Thin  Diet effective now                  Inpatient Medications Scheduled Meds: . aerochamber plus with mask  1 each Other Once  . albuterol  2 puff Inhalation TID  . amLODipine  10 mg Oral Daily  . apixaban  10 mg Oral BID   Followed by  . [START ON 08/14/2020] apixaban  5 mg Oral BID  . vitamin C  500 mg Oral Daily  . dexamethasone  6 mg Oral Daily  . insulin aspart  0-15 Units Subcutaneous TID WC  . insulin aspart  0-5 Units Subcutaneous QHS  . insulin glargine  5 Units Subcutaneous Daily  . levothyroxine  137 mcg Oral QAC breakfast  . losartan  100 mg Oral Daily  . pantoprazole  40 mg Oral Daily  . sertraline  100 mg Oral Daily  . umeclidinium bromide  1 puff Inhalation Daily  . zinc sulfate  220 mg Oral Daily   Continuous Infusions: . sodium chloride     PRN Meds:.sodium chloride, acetaminophen, albuterol, ALPRAZolam, chlorpheniramine-HYDROcodone, guaiFENesin-dextromethorphan, [DISCONTINUED] ondansetron **OR** ondansetron (ZOFRAN) IV  Antibiotics  :   Anti-infectives (From admission, onward)   Start     Dose/Rate  Route Frequency Ordered Stop   08/07/20 1000  remdesivir 100 mg in sodium chloride 0.9 % 100 mL IVPB       "Followed by" Linked Group Details   100 mg 200 mL/hr over 30 Minutes Intravenous Daily 08/06/20 1449 08/10/20 0913   08/07/20 1000  remdesivir 100 mg in sodium chloride 0.9 % 100 mL IVPB  Status:  Discontinued       "Followed by" Linked Group Details   100 mg 200 mL/hr over 30 Minutes Intravenous Daily 08/06/20 1503 08/06/20 1506   08/06/20 1700  remdesivir 200 mg in sodium chloride 0.9% 250 mL IVPB       "Followed by" Linked Group Details   200 mg 580 mL/hr over 30 Minutes Intravenous Once 08/06/20 1449 08/07/20 0345   08/06/20 1515  remdesivir 200 mg in sodium chloride 0.9% 250 mL IVPB  Status:  Discontinued       "Followed by" Linked Group Details   200 mg 580 mL/hr over 30 Minutes Intravenous Once 08/06/20 1503 08/06/20 1506          Objective:   Vitals:   08/12/20 1950 08/13/20 0000 08/13/20 0400 08/13/20 0800  BP: 110/66 131/67 (!) 131/46 (!) 144/77  Pulse: 88 83 73 78  Resp: 19 20 17 15   Temp: 98 F (36.7 C) 98.3 F (36.8 C) 98.1 F (36.7 C) 98.1 F (36.7 C)  TempSrc: Oral Oral Oral Oral  SpO2: 98% 97% 100% 99%  Weight:      Height:        SpO2: 99 % O2 Flow Rate (L/min): 3 L/min  Wt Readings from Last 3 Encounters:  08/06/20 95.3 kg  07/27/20 81.6 kg  10/24/18 103.1 kg     Intake/Output Summary (Last 24 hours) at 08/13/2020 1200 Last data filed at 08/12/2020 1552 Gross per 24 hour  Intake 120 ml  Output 500 ml  Net -380 ml     Physical Exam  Awake, alert, no apparent distress, oriented x2, confused, impaired cognition and insight  Good air entry bilaterally  Regular rate and rhythm  Abdomen soft  EXT  no cyanosis, clubbing or edema      Pressure Injury 07/10/20 Buttocks Left Stage 2 -  Partial thickness loss of dermis presenting as a shallow open injury with a red, pink wound bed without slough. (Active)  07/10/20 2330  Location:  Buttocks  Location Orientation: Left  Staging: Stage 2 -  Partial thickness loss of dermis presenting as a shallow open injury with a red, pink wound bed without slough.  Wound Description (Comments):   Present on Admission: Yes     Pressure Injury 07/11/20 Buttocks Left Stage 2 -  Partial thickness loss of dermis presenting as a shallow open injury with a red, pink wound bed without slough. (Active)  07/11/20 2330  Location: Buttocks  Location Orientation: Left  Staging: Stage 2 -  Partial thickness loss of dermis presenting as a shallow open injury with a red, pink wound bed without slough.  Wound  Description (Comments):   Present on Admission: Yes     Data Review:   Recent Labs  Lab 08/06/20 1511 08/07/20 0248 08/08/20 0403 08/10/20 0459 08/11/20 0059  WBC 3.9* 2.2* 2.4* 3.4* 4.4  HGB 9.8* 10.4* 9.2* 9.7* 10.4*  HCT 34.4* 34.8* 31.8* 33.3* 36.1  PLT 245 262 251 246 270  MCV 78.5* 77.9* 77.0* 78.0* 79.7*  MCH 22.4* 23.3* 22.3* 22.7* 23.0*  MCHC 28.5* 29.9* 28.9* 29.1* 28.8*  RDW Not Measured Not Measured Not Measured Not Measured 30.1*  LYMPHSABS 1.1 0.7 1.1 1.9 2.0  MONOABS 0.4 0.1 0.3 0.4 0.4  EOSABS 0.0 0.0 0.0 0.0 0.0  BASOSABS 0.0 0.0 0.0 0.0 0.0    Recent Labs  Lab 08/06/20 1305 08/06/20 1305 08/06/20 1435 08/06/20 1456 08/06/20 1503 08/06/20 1509 08/07/20 0248 08/07/20 0814 08/08/20 0403 08/08/20 1031 08/09/20 0300 08/10/20 0459 08/11/20 0059  NA 128*   < >  --    < >  --   --  131*  --  133*  --  134* 133* 133*  K 3.9   < >  --    < >  --   --  3.9  --  4.0  --  3.9 3.1* 3.9  CL 89*   < >  --   --   --   --  92*  --  92*  --  94* 92* 95*  CO2 28   < >  --   --   --   --  30  --  33*  --  31 30 29   GLUCOSE 89   < >  --   --   --   --  127*  --  101*  --  95 81 80  BUN 7*   < >  --   --   --   --  6*  --  10  --  9 10 10   CREATININE 0.47   < >  --   --   --   --  0.50  --  0.52  --  0.46 0.43* 0.41*  CALCIUM 8.4*   < >  --   --   --   --  8.7*  --   8.5*  --  8.6* 8.7* 8.8*  AST 21   < >  --   --   --   --  18  --  19  --  19 19 32  ALT 13   < >  --   --   --   --  15  --  14  --  16 20 40  ALKPHOS 52   < >  --   --   --   --  46  --  42  --  43 39 48  BILITOT 0.5   < >  --   --   --   --  0.3  --  0.5  --  0.4 0.5 0.5  ALBUMIN 3.0*   < >  --   --   --   --  2.9*  --  2.9*  --  2.9* 3.0* 3.2*  MG  --   --   --   --   --   --  1.9  --  1.9  --  1.9 1.8 1.9  CRP 5.5*   < >  --   --   --   --  6.6*  --  3.0*  --  1.6* 1.0* 1.1*  DDIMER 5.89*  --   --   --   --   --  5.09*  --   --   --   --   --   --   PROCALCITON <0.10  --   --   --   --   --   --   --   --   --   --   --   --   LATICACIDVEN 0.9  --  1.1  --   --   --   --   --   --   --   --   --   --   INR  --   --   --   --   --  1.2 1.2  --  1.9*  --  1.8*  --   --   TSH  --   --   --   --   --   --   --   --   --  1.275  --   --   --   HGBA1C  --   --   --   --   --   --   --  4.7*  --   --   --   --   --   BNP  --   --   --   --  23.3  --  38.8  --  29.6  --   --  43.8 36.8   < > = values in this interval not displayed.    Recent Labs  Lab 08/06/20 1305 08/06/20 1305 08/06/20 1503 08/07/20 0248 08/08/20 0403 08/09/20 0300 08/10/20 0459 08/11/20 0059  CRP 5.5*   < >  --  6.6* 3.0* 1.6* 1.0* 1.1*  DDIMER 5.89*  --   --  5.09*  --   --   --   --   BNP  --   --  23.3 38.8 29.6  --  43.8 36.8  PROCALCITON <0.10  --   --   --   --   --   --   --   SARSCOV2NAA POSITIVE*  --   --   --   --   --   --   --    < > = values in this interval not displayed.    ------------------------------------------------------------------------------------------------------------------ No results for input(s): CHOL, HDL, LDLCALC, TRIG, CHOLHDL, LDLDIRECT in the last 72 hours.  Lab Results  Component Value Date   HGBA1C 4.7 (L) 08/07/2020   ------------------------------------------------------------------------------------------------------------------ No results for input(s): TSH, T4TOTAL,  T3FREE, THYROIDAB in the last 72 hours.  Invalid input(s): FREET3 ------------------------------------------------------------------------------------------------------------------ No results for input(s): VITAMINB12, FOLATE, FERRITIN, TIBC, IRON, RETICCTPCT in the last 72 hours.  Coagulation profile Recent Labs  Lab 08/06/20 1509 08/07/20 0248 08/08/20 0403 08/09/20 0300  INR 1.2 1.2 1.9* 1.8*    No results for input(s): DDIMER in the last 72 hours.  Cardiac Enzymes No results for input(s): CKMB, TROPONINI, MYOGLOBIN in the last 168 hours.  Invalid input(s): CK ------------------------------------------------------------------------------------------------------------------    Component Value Date/Time   BNP 36.8 08/11/2020 0059    Micro Results Recent Results (from the past 240 hour(s))  Blood Culture (routine x 2)     Status: None   Collection Time: 08/06/20  1:00 PM   Specimen: BLOOD  Result Value Ref Range Status   Specimen Description BLOOD RIGHT ANTECUBITAL  Final   Special Requests   Final  BOTTLES DRAWN AEROBIC AND ANAEROBIC Blood Culture results may not be optimal due to an inadequate volume of blood received in culture bottles   Culture   Final    NO GROWTH 5 DAYS Performed at [facility], [place] [address], [place], [place] [address]    Report Status 08/11/2020 FINAL  Final  Respiratory Panel by RT PCR (Flu A&B, Covid) - Nasopharyngeal Swab     Status: Abnormal   Collection Time: 08/06/20  1:05 PM   Specimen: Nasopharyngeal Swab  Result Value Ref Range Status   SARS Coronavirus 2 by RT PCR POSITIVE (A) NEGATIVE Final    Comment: emailed L. Berdik RN 14:40 08/06/20 ([email or web address]) (NOTE) SARS-CoV-2 target nucleic acids are DETECTED.  SARS-CoV-2 RNA is generally detectable in upper respiratory specimens  during the acute phase of infection. Positive results are indicative of the presence of the identified virus, but do not rule out bacterial infection  or co-infection with other pathogens not detected by the test. Clinical correlation with patient history and other diagnostic information is necessary to determine patient infection status. The expected result is Negative.  Fact Sheet for Patients:  PinkCheek.be  Fact Sheet for Healthcare Providers: GravelBags.it  This test is not yet approved or cleared by the [place] FDA and  has been authorized for detection and/or diagnosis of SARS-CoV-2 by FDA under an Emergency Use Authorization (EUA).  This EUA will remain in effect (meaning this test can be used) for the duration of  the COVID-19  declaration under Section 564(b)(1) of the Act, 21 U.S.C. section 360bbb-3(b)(1), unless the authorization is terminated or revoked sooner.      Influenza A by PCR NEGATIVE NEGATIVE Final   Influenza B by PCR NEGATIVE NEGATIVE Final    Comment: (NOTE) The Xpert Xpress SARS-CoV-2/FLU/RSV assay is intended as an aid in  the diagnosis of influenza from Nasopharyngeal swab specimens and  should not be used as a sole basis for treatment. Nasal washings and  aspirates are unacceptable for Xpert Xpress SARS-CoV-2/FLU/RSV  testing.  Fact Sheet for Patients: PinkCheek.be  Fact Sheet for Healthcare Providers: GravelBags.it  This test is not yet approved or cleared by the [place] FDA and  has been authorized for detection and/or diagnosis of SARS-CoV-2 by  FDA under an Emergency Use Authorization (EUA). This EUA will remain  in effect (meaning this test can be used) for the duration of the  Covid-19 declaration under Section 564(b)(1) of the Act, 21  U.S.C. section 360bbb-3(b)(1), unless the authorization is  terminated or revoked. Performed at [facility], [place] [address] [place], [place] [address]   Blood Culture (routine x 2)     Status: None   Collection Time:  08/06/20  1:22 PM   Specimen: BLOOD RIGHT WRIST  Result Value Ref Range Status   Specimen Description BLOOD RIGHT WRIST  Final   Special Requests   Final    BOTTLES DRAWN AEROBIC AND ANAEROBIC Blood Culture results may not be optimal due to an inadequate volume of blood received in culture bottles   Culture   Final    NO GROWTH 5 DAYS Performed at [facility], [place] [address] [place], [place] [address]    Report Status 08/11/2020 FINAL  Final  MRSA PCR Screening     Status: None   Collection Time: 08/07/20  6:23 AM   Specimen: Nasal Mucosa; Nasopharyngeal  Result Value Ref Range Status   MRSA by PCR NEGATIVE NEGATIVE Final  Comment:        The GeneXpert MRSA Assay (FDA approved for NASAL specimens only), is one component of a comprehensive MRSA colonization surveillance program. It is not intended to diagnose MRSA infection nor to guide or monitor treatment for MRSA infections. Performed at [facility], [place] [address] [place], [place] [address]     Radiology Reports DG Chest 2 View  Result Date: 07/26/2020 CLINICAL DATA:  Left pleural effusion EXAM: CHEST - 2 VIEW COMPARISON:  07/24/2020 FINDINGS: Small left pleural effusion is increased. Left basilar atelectasis is present. No pneumothorax. Background changes of emphysema. Stable cardiomediastinal contours. IMPRESSION: Small left pleural effusion increased since 07/24/2020. Mild left basilar atelectasis. Electronically Signed   By: Macy Mis M.D.   On: 07/26/2020 08:07   CT CHEST WO CONTRAST  Result Date: 07/23/2020 CLINICAL DATA:  Abnormal radiograph, pleural effusion EXAM: CT CHEST WITHOUT CONTRAST TECHNIQUE: Multidetector CT imaging of the chest was performed following the standard protocol without IV contrast. COMPARISON:  Radiograph 07/21/2020, CT 07/10/2020 FINDINGS: Cardiovascular: Cardiac size within normal limits. Small volume pericardial fluid is similar to prior. Dense calcifications along the  mitral annulus. Additional calcifications of the aortic leaflets. Hypoattenuation of the cardiac blood pool compatible with anemia. Extensive three-vessel native coronary artery atherosclerosis. Atherosclerotic plaque within the normal caliber aorta. No hyperdense mural thickening or plaque displacement nor periaortic stranding or hemorrhage. Shared origin of the brachiocephalic and left common carotid arteries. Additional calcifications seen throughout the proximal great vessels. Central pulmonary arteries are normal caliber. Luminal evaluation precluded in the absence of contrast media. No visible worrisome venous abnormalities. Mediastinum/Nodes: Persistent slight leftward mediastinal shift, similar to comparison. No mediastinal fluid, gas or hemorrhage. Redemonstration of multiple asymmetrically prominent though non pathologically enlarged left axillary and mediastinal lymph nodes. No right axillary adenopathy. Hilar nodal is severely limited in the absence of contrast media. No acute abnormality of the trachea or esophagus. Thyroid gland is diminutive, correlate for prior thyroidectomy or ablation. Lungs/Pleura: Background of moderate centrilobular emphysematous changes throughout both lungs. There is a moderate residual left pleural effusion with adjacent areas of passive atelectasis in the left lung base. Some additional thickening is noted along the left major fissure which may reflect some persistent loculated fluid. Adjacent atelectatic changes are present along the left major fissure as well. Predominantly involving the lingula and posterior segment left upper lobe. Underlying consolidation would be difficult to exclude. Some there is a stable sub solid opacity in the posterosuperior segment right lower lobe (4/74 measuring up to 8 mm. Possibly post infectious or inflammatory. Stable scarring within the anterior basal segment right lower lobe and major fissure. Right lung is otherwise clear. Upper  Abdomen: Multiple calcified gallstones within the largely decompressed gallbladder. Scattered colonic diverticula without focal inflammation to suggest diverticulitis. Extensive upper abdominal atherosclerotic calcification. Stable likely parapelvic cyst in the upper pole left kidney. Musculoskeletal: Mild body wall edema, left slightly more pronounced than right. No acute abnormality or worrisome osseous lesions. Levocurvature of the upper thoracic spine with some mild dextrocurvature of the mid to lower levels. Multilevel discogenic and facet degenerative changes are present. Additional degenerative changes of the right shoulder and sternoclavicular joints similar to prior. IMPRESSION: 1. Moderate residual left pleural effusion with adjacent areas of passive atelectasis in the left lung base. Some additional thickening along the left major fissure may reflect some persistent loculated fluid with some further atelectatic change in the lingula as well. Underlying consolidation within the atelectatic lung would cannot be  fully excluded. 2. Stable appearance of an 8 mm ground-glass opacity in the posterosuperior segment right lower lobe. Possibly post infectious or inflammatory. Recommend attention on follow-up imaging. Initial follow-up with CT at 6-12 months is recommended to confirm persistence. If persistent, repeat CT is recommended every 2 years until 5 years of stability has been established. This recommendation follows the consensus statement: Guidelines for Management of Incidental Pulmonary Nodules Detected on CT Images: From the Fleischner Society 2017; Radiology 2017; 284:228-243. 3. Redemonstration of multiple asymmetrically prominent though non pathologically enlarged left axillary and mediastinal lymph nodes, nonspecific though possibly reactive or edematous. 4. Cholelithiasis without evidence of acute cholecystitis. 5. Colonic diverticulosis without evidence of diverticulitis. 6. Aortic Atherosclerosis  (ICD10-I70.0) 7. Three-vessel coronary artery calcifications. 8. Emphysema (ICD10-J43.9). Electronically Signed   By: Lovena Le M.D.   On: 07/23/2020 22:26   CT ANGIO CHEST PE W OR WO CONTRAST  Result Date: 08/07/2020 CLINICAL DATA:  PE suspected, established right-sided PE, increased shortness of breath, assess embolus burden EXAM: CT ANGIOGRAPHY CHEST WITH CONTRAST TECHNIQUE: Multidetector CT imaging of the chest was performed using the standard protocol during bolus administration of intravenous contrast. Multiplanar CT image reconstructions and MIPs were obtained to evaluate the vascular anatomy. CONTRAST:  32mL OMNIPAQUE IOHEXOL 350 MG/ML SOLN COMPARISON:  None. FINDINGS: Cardiovascular: Examination for pulmonary embolism is somewhat limited by breath motion artifact. Within this limitation, there are occasional probable subsegmental emboli, for example in the dependent right lower lobe (series 6, image 76) and in the dependent left lower lobe (series 6, image 63). There is no segmental or more central emboli identified. Aortic atherosclerosis. Normal heart size. Three-vessel coronary artery calcifications. Enlargement of the main pulmonary artery measuring up to 3.6 cm in caliber. Small pericardial effusion. Mediastinum/Nodes: No enlarged mediastinal, hilar, or axillary lymph nodes. Thyroid gland, trachea, and esophagus demonstrate no significant findings. Lungs/Pleura: Moderate centrilobular emphysema. Moderate left pleural effusion and associated atelectasis or consolidation, similar to prior examination. Upper Abdomen: No acute abnormality. Musculoskeletal: No chest wall abnormality. No acute or significant osseous findings. Review of the MIP images confirms the above findings. IMPRESSION: 1. Examination for pulmonary embolism is somewhat limited by breath motion artifact. Within this limitation, there are occasional probable subsegmental emboli present bilaterally. No segmental or more central  embolus identified. 2. Moderate left pleural effusion and associated atelectasis or consolidation, similar to prior examination. 3. Emphysema (ICD10-J43.9). 4. Coronary artery disease. Aortic Atherosclerosis (ICD10-I70.0) 5. Enlargement of the main pulmonary artery measuring up to 3.6 cm in caliber, as can be seen in pulmonary hypertension. Electronically Signed   By: Eddie Candle M.D.   On: 08/07/2020 15:33   DG Chest Port 1 View  Result Date: 08/06/2020 CLINICAL DATA:  COVID-19 positive with shortness of breath EXAM: PORTABLE CHEST 1 VIEW COMPARISON:  July 26, 2020 FINDINGS: There is left lower lobe airspace opacity with small left pleural effusion. The lungs elsewhere are clear. The heart is upper normal in size with pulmonary vascularity normal. No adenopathy. There is aortic atherosclerosis. No bone lesions. IMPRESSION: Small left pleural effusion. Airspace opacity in the left lower lobe concerning for pneumonia with likely associated atelectasis. Lungs elsewhere clear. Stable cardiac silhouette. Aortic Atherosclerosis (ICD10-I70.0). Electronically Signed   By: Lowella Grip III M.D.   On: 08/06/2020 13:31   DG CHEST PORT 1 VIEW  Result Date: 07/24/2020 CLINICAL DATA:  Status post left thoracentesis EXAM: PORTABLE CHEST 1 VIEW COMPARISON:  07/21/2020 FINDINGS: Trace left pleural effusion, improved status post thoracentesis. No  pneumothorax is seen. Right lung is essentially clear. The heart is normal in size.  Thoracic aortic atherosclerosis. IMPRESSION: Trace left pleural effusion, improved status post thoracentesis. No pneumothorax is seen. Electronically Signed   By: Julian Hy M.D.   On: 07/24/2020 13:01   DG Chest Port 1 View  Result Date: 07/21/2020 CLINICAL DATA:  Shortness of breath EXAM: PORTABLE CHEST 1 VIEW COMPARISON:  July 15, 2020 FINDINGS: There is consolidation throughout the left mid and lower lung regions with questionable superimposed left pleural effusion. The  right lung is clear. Heart is upper normal in size with pulmonary vascularity normal. No adenopathy. There is aortic atherosclerosis. No bone lesions. IMPRESSION: Consolidation throughout much of the left mid and lower lung regions with suspected small pleural effusions superimposed on the left. Right lung clear. Heart upper normal in size. No adenopathy. Aortic Atherosclerosis (ICD10-I70.0). Electronically Signed   By: Lowella Grip III M.D.   On: 07/21/2020 14:53   DG Chest Port 1 View  Result Date: 07/15/2020 CLINICAL DATA:  Left chest tube EXAM: PORTABLE CHEST 1 VIEW COMPARISON:  07/14/2020 FINDINGS: Slight retraction of the left small bore chest tube. No significant pneumothorax by plain radiography. Persistent left basilar collapse/consolidation. No enlarging effusion. Stable right lung aeration. Heart remains enlarged. Aorta atherosclerotic.  Degenerative changes noted of the spine. IMPRESSION: Slight retraction of the left chest tube. No current significant pneumothorax by plain radiography Persistent left lower lung collapse/consolidation. Aortic Atherosclerosis (ICD10-I70.0). Electronically Signed   By: Jerilynn Mages.  Shick M.D.   On: 07/15/2020 08:00   DG Chest Port 1 View  Result Date: 07/14/2020 CLINICAL DATA:  Chest tube present EXAM: PORTABLE CHEST 1 VIEW COMPARISON:  July 14, 2020 study obtained earlier in the day. FINDINGS: Chest tube on the left is unchanged in position. No evident pneumothorax. There is bibasilar atelectatic change, somewhat more on the left than on the right, stable. No new opacity evident. Heart is upper normal in size with pulmonary vascularity normal. No adenopathy. There is aortic atherosclerosis. No bone lesions. There is bilateral carotid artery calcification. IMPRESSION: Chest tube unchanged on the left without evident pneumothorax. Lower lung region atelectasis is stable. No new opacity. Stable cardiac silhouette. Aortic Atherosclerosis (ICD10-I70.0). There is  bilateral carotid artery calcification. Electronically Signed   By: Lowella Grip III M.D.   On: 07/14/2020 13:25    Phillips Climes M.D on 08/13/2020 at 12:00 PM  To page go to [email or web address]
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
## 2020-08-13 NOTE — Plan of Care (Signed)
Patient is currently resting in bed. Aox2-3. Denies pain. VSS. Remains on 3L [place]. Q2 turns. Incont of urine and stool. Purewick removed this AM d/t skin breakdown between legs. Cream applied. Call bell within reach. Bed alarm on.   Problem: Education: Goal: Knowledge of General Education information will improve Description: Including pain rating scale, medication(s)/side effects and non-pharmacologic comfort measures Outcome: Progressing   Problem: Health Behavior/Discharge Planning: Goal: Ability to manage health-related needs will improve Outcome: Progressing   Problem: Clinical Measurements: Goal: Ability to maintain clinical measurements within normal limits will improve Outcome: Progressing Goal: Will remain free from infection Outcome: Progressing Goal: Diagnostic test results will improve Outcome: Progressing Goal: Respiratory complications will improve Outcome: Progressing Goal: Cardiovascular complication will be avoided Outcome: Progressing   Problem: Activity: Goal: Risk for activity intolerance will decrease Outcome: Progressing   Problem: Nutrition: Goal: Adequate nutrition will be maintained Outcome: Progressing   Problem: Coping: Goal: Level of anxiety will decrease Outcome: Progressing   Problem: Elimination: Goal: Will not experience complications related to bowel motility Outcome: Progressing Goal: Will not experience complications related to urinary retention Outcome: Progressing   Problem: Pain Managment: Goal: General experience of comfort will improve Outcome: Progressing   Problem: Safety: Goal: Ability to remain free from injury will improve Outcome: Progressing   Problem: Skin Integrity: Goal: Risk for impaired skin integrity will decrease Outcome: Progressing   Problem: Education: Goal: Knowledge of risk factors and measures for prevention of condition will improve Outcome: Progressing   Problem: Coping: Goal: Psychosocial and  spiritual needs will be supported Outcome: Progressing   Problem: Respiratory: Goal: Will maintain a patent airway Outcome: Progressing Goal: Complications related to the disease process, condition or treatment will be avoided or minimized Outcome: Progressing   Problem: Education: Goal: Knowledge of disease or condition will improve Outcome: Progressing Goal: Knowledge of the prescribed therapeutic regimen will improve Outcome: Progressing Goal: Individualized Educational Video(s) Outcome: Progressing   Problem: Activity: Goal: Ability to tolerate increased activity will improve Outcome: Progressing Goal: Will verbalize the importance of balancing activity with adequate rest periods Outcome: Progressing   Problem: Respiratory: Goal: Ability to maintain a clear airway will improve Outcome: Progressing Goal: Levels of oxygenation will improve Outcome: Progressing Goal: Ability to maintain adequate ventilation will improve Outcome: Progressing

## 2020-08-14 DIAGNOSIS — U071 COVID-19: Secondary | ICD-10-CM | POA: Diagnosis not present

## 2020-08-14 DIAGNOSIS — J1282 Pneumonia due to coronavirus disease 2019: Secondary | ICD-10-CM | POA: Diagnosis not present

## 2020-08-14 LAB — GLUCOSE, CAPILLARY
Glucose-Capillary: 87 mg/dL (ref 70–99)
Glucose-Capillary: 106 mg/dL — ABNORMAL HIGH (ref 70–99)
Glucose-Capillary: 99 mg/dL (ref 70–99)
Glucose-Capillary: 137 mg/dL — ABNORMAL HIGH (ref 70–99)

## 2020-08-14 NOTE — Progress Notes (Signed)
PROGRESS NOTE                                                                                                                                                                                                             Patient Demographics:    Jaime Kelley, is a 69 y.o. female, DOB - [birth year], NIO:[id number]  Admit date - 08/06/2020   Admitting Physician Mckinley Jewel, MD  Outpatient Primary MD for the patient is Laurey Morale, MD  LOS - 8  Chief Complaint  Patient presents with  . Shortness of Breath    COVID +       Brief Narrative (HPI from H&P)  - Jaime Kelley is a 69 y.o. female with medical history significant of hypertension, type 2 diabetes mellitus, morbid obesity, depression/anxiety, chronic hypoxemic respiratory failure secondary to underlying COPD-on 5 L of oxygen at baseline, RA, hypothyroidism, history of PE/DVT-on Coumadin, GERD presents to emergency department for evaluation of hypoxia.  She was recently admitted on 9/18 with complaints of substernal chest pressure and was found to have large left-sided pleural effusion with complete collapse of left lung requiring nonrebreather initially.  Patient underwent left pleural space drainage and chest tube placement which was removed on 9/23.  Bronchoscopy was performed on 9/20 with removal of solitary mucous plug.  Repeat chest x-ray showed recurrent opacification.  Pulmonology performed a repeat thoracentesis at bedside on 07/24/2020.  Chest x-ray on 10/5 demonstrated a small amount of reaccumulation of pleural effusion however oxygen requirements improved to 3 L therefore she discharged to SNF in a stable condition and received [place] Covid vaccine on 10/5.  Work-up here suggestive of acute PE plus minus possible mild Covid pneumonia.    Subjective:   Patient in bed, appears comfortable, denies headache, fever, chest pain .  No significant events overnight as discussed with patient or  staff.  She is confused, thinking daughter was sitting at her bedside earlier today.   Assessment  & Plan :    Acute on chronic hypoxic respiratory failure -Multifactorial, in the setting of baseline COPD. -currently with acute PE.  -she was recently admitted with large left-sided pleural effusion requiring thoracentesis and bronchoscopy for removal of solitary mucous plug.  She also recently received Covid vaccine on 07/27/2020.  She is now here with shortness of breath with minimal left-sided pleural effusion and atelectasis.  Her present acute on chronic hypoxia could be due to mild bronchitis + PE , doubt she has  true COVID-19 pneumonia, for now since started will finish steroids(taper) and Remdesivir, she is also chronically on Coumadin with subtherapeutic INR, she was treated initially with Lovenox, now transition to Eliquis,.  Continue supplemental oxygen note she is on 5 L of nasal cannula oxygen at home.     Recent Labs  Lab 08/08/20 0403 08/09/20 0300 08/10/20 0459 08/11/20 0059  WBC 2.4*  --  3.4* 4.4  HGB 9.2*  --  9.7* 10.4*  HCT 31.8*  --  33.3* 36.1  PLT 251  --  246 270  CRP 3.0* 1.6* 1.0* 1.1*  BNP 29.6  --  43.8 36.8  AST 19 19 19  32  ALT 14 16 20  40  ALKPHOS 42 43 39 48  BILITOT 0.5 0.4 0.5 0.5  ALBUMIN 2.9* 2.9* 3.0* 3.2*  INR 1.9* 1.8*  --   --      Underlying COPD with 5 L nasal cannula oxygen at home.   -Supportive care.  Treated with IV steroids, now transitioned to p.o. prednisone, she is with no wheezing currently  Social/ethics -Patient reports she wants to go home, did not want placement, as well she threatened to leave AMA, though she is awake alert x3 intermittently, she is with some episodes of confusion and delusions intermittently, psychiatry were consulted during previous admission, and this admission as well, for which she has deemed not to have capacity to make her own medical decisions, and placement decisions as well. -Social worker has been  consulted, trying to assist with SNF placement. -. Stable TSH, B12 and RPR.  Acute PE with history of DVT. -  Was on Coumadin with subtherapeutic INR, has a new PE, placed on Lovenox >> Eliquis on 08/09/2020.  Anxiety and depression  -on Zoloft and Xanax.  Continue.  Hypothyroidism.  On Synthroid.  Essential hypertension.  On Norvasc and ARB.  Morbid obesity.  BMI 41.  Follow with PCP.   Hypokalemia -Repleted  DM type II.  Low-dose Lantus and sliding scale and monitor.  Lab Results  Component Value Date   HGBA1C 4.7 (L) 08/07/2020   CBG (last 3)  Recent Labs    08/13/20 2050 08/14/20 0810 08/14/20 1223  GLUCAP 119* 87 106*      Condition - Extremely Guarded  Family Communication  :    Discussed with daughter [name] 10/23.  Code Status :  Full  Consults  : Psych  Procedures  :    PUD Prophylaxis : PPI  Disposition Plan  :    Status is: Inpatient  Remains inpatient appropriate because:Unsafe d/c plan   Dispo: The patient is from: Home              Anticipated d/c is to: SNF              Anticipated d/c date is: 1 day              Patient currently is medically stable to d/c.  Patient is medically ready for discharge, pending safe discharge plan to a facility, she will need SNF, social worker involved.  DVT Prophylaxis  :  Lovenox >> Coumadin  Lab Results  Component Value Date   INR 1.8 (H) 08/09/2020   INR 1.9 (H) 08/08/2020   INR 1.2 08/07/2020     Lab Results  Component Value Date   PLT 270 08/11/2020    Diet :  Diet Order            Diet heart healthy/carb modified Room  service appropriate? Yes; Fluid consistency: Thin  Diet effective now                  Inpatient Medications Scheduled Meds: . aerochamber plus with mask  1 each Other Once  . albuterol  2 puff Inhalation TID  . amLODipine  10 mg Oral Daily  . apixaban  5 mg Oral BID  . vitamin C  500 mg Oral Daily  . dexamethasone  6 mg Oral Daily  . insulin aspart   0-15 Units Subcutaneous TID WC  . insulin aspart  0-5 Units Subcutaneous QHS  . insulin glargine  5 Units Subcutaneous Daily  . levothyroxine  137 mcg Oral QAC breakfast  . losartan  100 mg Oral Daily  . pantoprazole  40 mg Oral Daily  . sertraline  100 mg Oral Daily  . umeclidinium bromide  1 puff Inhalation Daily  . zinc sulfate  220 mg Oral Daily   Continuous Infusions: . sodium chloride     PRN Meds:.sodium chloride, acetaminophen, albuterol, ALPRAZolam, chlorpheniramine-HYDROcodone, guaiFENesin-dextromethorphan, [DISCONTINUED] ondansetron **OR** ondansetron (ZOFRAN) IV  Antibiotics  :   Anti-infectives (From admission, onward)   Start     Dose/Rate Route Frequency Ordered Stop   08/07/20 1000  remdesivir 100 mg in sodium chloride 0.9 % 100 mL IVPB       "Followed by" Linked Group Details   100 mg 200 mL/hr over 30 Minutes Intravenous Daily 08/06/20 1449 08/10/20 0913   08/07/20 1000  remdesivir 100 mg in sodium chloride 0.9 % 100 mL IVPB  Status:  Discontinued       "Followed by" Linked Group Details   100 mg 200 mL/hr over 30 Minutes Intravenous Daily 08/06/20 1503 08/06/20 1506   08/06/20 1700  remdesivir 200 mg in sodium chloride 0.9% 250 mL IVPB       "Followed by" Linked Group Details   200 mg 580 mL/hr over 30 Minutes Intravenous Once 08/06/20 1449 08/07/20 0345   08/06/20 1515  remdesivir 200 mg in sodium chloride 0.9% 250 mL IVPB  Status:  Discontinued       "Followed by" Linked Group Details   200 mg 580 mL/hr over 30 Minutes Intravenous Once 08/06/20 1503 08/06/20 1506          Objective:   Vitals:   08/14/20 0000 08/14/20 0400 08/14/20 0807 08/14/20 1219  BP: 126/61 122/67 136/77 112/68  Pulse: 80 75 77 93  Resp: 15 16 19 17   Temp: 98.1 F (36.7 C) 98.4 F (36.9 C) 98 F (36.7 C) 98.1 F (36.7 C)  TempSrc: Oral Oral Oral Oral  SpO2: 99% 98% 91% 97%  Weight:      Height:        SpO2: 97 % O2 Flow Rate (L/min): 3 L/min  Wt Readings from Last 3  Encounters:  08/06/20 95.3 kg  07/27/20 81.6 kg  10/24/18 103.1 kg     Intake/Output Summary (Last 24 hours) at 08/14/2020 1324 Last data filed at 08/14/2020 0930 Gross per 24 hour  Intake 360 ml  Output --  Net 360 ml     Physical Exam  Awake, alert, no apparent distress, oriented x2, confused, impaired cognition and insight  Good air entry bilaterally Regular rate and rhythm  Abdomen soft EXT  no cyanosis, clubbing or edema      Pressure Injury 07/10/20 Buttocks Left Stage 2 -  Partial thickness loss of dermis presenting as a shallow open injury with a red, pink wound bed  without slough. (Active)  07/10/20 2330  Location: Buttocks  Location Orientation: Left  Staging: Stage 2 -  Partial thickness loss of dermis presenting as a shallow open injury with a red, pink wound bed without slough.  Wound Description (Comments):   Present on Admission: Yes     Pressure Injury 07/11/20 Buttocks Left Stage 2 -  Partial thickness loss of dermis presenting as a shallow open injury with a red, pink wound bed without slough. (Active)  07/11/20 2330  Location: Buttocks  Location Orientation: Left  Staging: Stage 2 -  Partial thickness loss of dermis presenting as a shallow open injury with a red, pink wound bed without slough.  Wound Description (Comments):   Present on Admission: Yes     Data Review:   Recent Labs  Lab 08/08/20 0403 08/10/20 0459 08/11/20 0059  WBC 2.4* 3.4* 4.4  HGB 9.2* 9.7* 10.4*  HCT 31.8* 33.3* 36.1  PLT 251 246 270  MCV 77.0* 78.0* 79.7*  MCH 22.3* 22.7* 23.0*  MCHC 28.9* 29.1* 28.8*  RDW Not Measured Not Measured 30.1*  LYMPHSABS 1.1 1.9 2.0  MONOABS 0.3 0.4 0.4  EOSABS 0.0 0.0 0.0  BASOSABS 0.0 0.0 0.0    Recent Labs  Lab 08/08/20 0403 08/08/20 1031 08/09/20 0300 08/10/20 0459 08/11/20 0059  NA 133*  --  134* 133* 133*  K 4.0  --  3.9 3.1* 3.9  CL 92*  --  94* 92* 95*  CO2 33*  --  31 30 29   GLUCOSE 101*  --  95 81 80  BUN 10   --  9 10 10   CREATININE 0.52  --  0.46 0.43* 0.41*  CALCIUM 8.5*  --  8.6* 8.7* 8.8*  AST 19  --  19 19 32  ALT 14  --  16 20 40  ALKPHOS 42  --  43 39 48  BILITOT 0.5  --  0.4 0.5 0.5  ALBUMIN 2.9*  --  2.9* 3.0* 3.2*  MG 1.9  --  1.9 1.8 1.9  CRP 3.0*  --  1.6* 1.0* 1.1*  INR 1.9*  --  1.8*  --   --   TSH  --  1.275  --   --   --   BNP 29.6  --   --  43.8 36.8    Recent Labs  Lab 08/08/20 0403 08/09/20 0300 08/10/20 0459 08/11/20 0059  CRP 3.0* 1.6* 1.0* 1.1*  BNP 29.6  --  43.8 36.8    ------------------------------------------------------------------------------------------------------------------ No results for input(s): CHOL, HDL, LDLCALC, TRIG, CHOLHDL, LDLDIRECT in the last 72 hours.  Lab Results  Component Value Date   HGBA1C 4.7 (L) 08/07/2020   ------------------------------------------------------------------------------------------------------------------ No results for input(s): TSH, T4TOTAL, T3FREE, THYROIDAB in the last 72 hours.  Invalid input(s): FREET3 ------------------------------------------------------------------------------------------------------------------ No results for input(s): VITAMINB12, FOLATE, FERRITIN, TIBC, IRON, RETICCTPCT in the last 72 hours.  Coagulation profile Recent Labs  Lab 08/08/20 0403 08/09/20 0300  INR 1.9* 1.8*    No results for input(s): DDIMER in the last 72 hours.  Cardiac Enzymes No results for input(s): CKMB, TROPONINI, MYOGLOBIN in the last 168 hours.  Invalid input(s): CK ------------------------------------------------------------------------------------------------------------------    Component Value Date/Time   BNP 36.8 08/11/2020 0059    Micro Results Recent Results (from the past 240 hour(s))  Blood Culture (routine x 2)     Status: None   Collection Time: 08/06/20  1:00 PM   Specimen: BLOOD  Result Value Ref Range Status   Specimen  Description BLOOD RIGHT ANTECUBITAL  Final   Special  Requests   Final    BOTTLES DRAWN AEROBIC AND ANAEROBIC Blood Culture results may not be optimal due to an inadequate volume of blood received in culture bottles   Culture   Final    NO GROWTH 5 DAYS Performed at [facility], [place] [address] [place], [place] [address]    Report Status 08/11/2020 FINAL  Final  Respiratory Panel by RT PCR (Flu A&B, Covid) - Nasopharyngeal Swab     Status: Abnormal   Collection Time: 08/06/20  1:05 PM   Specimen: Nasopharyngeal Swab  Result Value Ref Range Status   SARS Coronavirus 2 by RT PCR POSITIVE (A) NEGATIVE Final    Comment: emailed L. Berdik RN 14:40 08/06/20 ([email or web address]) (NOTE) SARS-CoV-2 target nucleic acids are DETECTED.  SARS-CoV-2 RNA is generally detectable in upper respiratory specimens  during the acute phase of infection. Positive results are indicative of the presence of the identified virus, but do not rule out bacterial infection or co-infection with other pathogens not detected by the test. Clinical correlation with patient history and other diagnostic information is necessary to determine patient infection status. The expected result is Negative.  Fact Sheet for Patients:  PinkCheek.be  Fact Sheet for Healthcare Providers: GravelBags.it  This test is not yet approved or cleared by the [place] FDA and  has been authorized for detection and/or diagnosis of SARS-CoV-2 by FDA under an Emergency Use Authorization (EUA).  This EUA will remain in effect (meaning this test can be used) for the duration of  the COVID-19  declaration under Section 564(b)(1) of the Act, 21 U.S.C. section 360bbb-3(b)(1), unless the authorization is terminated or revoked sooner.      Influenza A by PCR NEGATIVE NEGATIVE Final   Influenza B by PCR NEGATIVE NEGATIVE Final    Comment: (NOTE) The Xpert Xpress SARS-CoV-2/FLU/RSV assay is intended as an aid in  the diagnosis of influenza  from Nasopharyngeal swab specimens and  should not be used as a sole basis for treatment. Nasal washings and  aspirates are unacceptable for Xpert Xpress SARS-CoV-2/FLU/RSV  testing.  Fact Sheet for Patients: PinkCheek.be  Fact Sheet for Healthcare Providers: GravelBags.it  This test is not yet approved or cleared by the [place] FDA and  has been authorized for detection and/or diagnosis of SARS-CoV-2 by  FDA under an Emergency Use Authorization (EUA). This EUA will remain  in effect (meaning this test can be used) for the duration of the  Covid-19 declaration under Section 564(b)(1) of the Act, 21  U.S.C. section 360bbb-3(b)(1), unless the authorization is  terminated or revoked. Performed at [facility], [place] [address], [place], [place] [address]   Blood Culture (routine x 2)     Status: None   Collection Time: 08/06/20  1:22 PM   Specimen: BLOOD RIGHT WRIST  Result Value Ref Range Status   Specimen Description BLOOD RIGHT WRIST  Final   Special Requests   Final    BOTTLES DRAWN AEROBIC AND ANAEROBIC Blood Culture results may not be optimal due to an inadequate volume of blood received in culture bottles   Culture   Final    NO GROWTH 5 DAYS Performed at [facility], [place] [address], [place], [place] [address]    Report Status 08/11/2020 FINAL  Final  MRSA PCR Screening     Status: None   Collection Time: 08/07/20  6:23 AM   Specimen: Nasal Mucosa;  Nasopharyngeal  Result Value Ref Range Status   MRSA by PCR NEGATIVE NEGATIVE Final    Comment:        The GeneXpert MRSA Assay (FDA approved for NASAL specimens only), is one component of a comprehensive MRSA colonization surveillance program. It is not intended to diagnose MRSA infection nor to guide or monitor treatment for MRSA infections. Performed at [facility], [place] [address] [place], [place] [address]     Radiology Reports DG  Chest 2 View  Result Date: 07/26/2020 CLINICAL DATA:  Left pleural effusion EXAM: CHEST - 2 VIEW COMPARISON:  07/24/2020 FINDINGS: Small left pleural effusion is increased. Left basilar atelectasis is present. No pneumothorax. Background changes of emphysema. Stable cardiomediastinal contours. IMPRESSION: Small left pleural effusion increased since 07/24/2020. Mild left basilar atelectasis. Electronically Signed   By: Macy Mis M.D.   On: 07/26/2020 08:07   CT CHEST WO CONTRAST  Result Date: 07/23/2020 CLINICAL DATA:  Abnormal radiograph, pleural effusion EXAM: CT CHEST WITHOUT CONTRAST TECHNIQUE: Multidetector CT imaging of the chest was performed following the standard protocol without IV contrast. COMPARISON:  Radiograph 07/21/2020, CT 07/10/2020 FINDINGS: Cardiovascular: Cardiac size within normal limits. Small volume pericardial fluid is similar to prior. Dense calcifications along the mitral annulus. Additional calcifications of the aortic leaflets. Hypoattenuation of the cardiac blood pool compatible with anemia. Extensive three-vessel native coronary artery atherosclerosis. Atherosclerotic plaque within the normal caliber aorta. No hyperdense mural thickening or plaque displacement nor periaortic stranding or hemorrhage. Shared origin of the brachiocephalic and left common carotid arteries. Additional calcifications seen throughout the proximal great vessels. Central pulmonary arteries are normal caliber. Luminal evaluation precluded in the absence of contrast media. No visible worrisome venous abnormalities. Mediastinum/Nodes: Persistent slight leftward mediastinal shift, similar to comparison. No mediastinal fluid, gas or hemorrhage. Redemonstration of multiple asymmetrically prominent though non pathologically enlarged left axillary and mediastinal lymph nodes. No right axillary adenopathy. Hilar nodal is severely limited in the absence of contrast media. No acute abnormality of the trachea or  esophagus. Thyroid gland is diminutive, correlate for prior thyroidectomy or ablation. Lungs/Pleura: Background of moderate centrilobular emphysematous changes throughout both lungs. There is a moderate residual left pleural effusion with adjacent areas of passive atelectasis in the left lung base. Some additional thickening is noted along the left major fissure which may reflect some persistent loculated fluid. Adjacent atelectatic changes are present along the left major fissure as well. Predominantly involving the lingula and posterior segment left upper lobe. Underlying consolidation would be difficult to exclude. Some there is a stable sub solid opacity in the posterosuperior segment right lower lobe (4/74 measuring up to 8 mm. Possibly post infectious or inflammatory. Stable scarring within the anterior basal segment right lower lobe and major fissure. Right lung is otherwise clear. Upper Abdomen: Multiple calcified gallstones within the largely decompressed gallbladder. Scattered colonic diverticula without focal inflammation to suggest diverticulitis. Extensive upper abdominal atherosclerotic calcification. Stable likely parapelvic cyst in the upper pole left kidney. Musculoskeletal: Mild body wall edema, left slightly more pronounced than right. No acute abnormality or worrisome osseous lesions. Levocurvature of the upper thoracic spine with some mild dextrocurvature of the mid to lower levels. Multilevel discogenic and facet degenerative changes are present. Additional degenerative changes of the right shoulder and sternoclavicular joints similar to prior. IMPRESSION: 1. Moderate residual left pleural effusion with adjacent areas of passive atelectasis in the left lung base. Some additional thickening along the left major fissure may reflect some persistent loculated fluid with  some further atelectatic change in the lingula as well. Underlying consolidation within the atelectatic lung would cannot be fully  excluded. 2. Stable appearance of an 8 mm ground-glass opacity in the posterosuperior segment right lower lobe. Possibly post infectious or inflammatory. Recommend attention on follow-up imaging. Initial follow-up with CT at 6-12 months is recommended to confirm persistence. If persistent, repeat CT is recommended every 2 years until 5 years of stability has been established. This recommendation follows the consensus statement: Guidelines for Management of Incidental Pulmonary Nodules Detected on CT Images: From the Fleischner Society 2017; Radiology 2017; 284:228-243. 3. Redemonstration of multiple asymmetrically prominent though non pathologically enlarged left axillary and mediastinal lymph nodes, nonspecific though possibly reactive or edematous. 4. Cholelithiasis without evidence of acute cholecystitis. 5. Colonic diverticulosis without evidence of diverticulitis. 6. Aortic Atherosclerosis (ICD10-I70.0) 7. Three-vessel coronary artery calcifications. 8. Emphysema (ICD10-J43.9). Electronically Signed   By: Lovena Le M.D.   On: 07/23/2020 22:26   CT ANGIO CHEST PE W OR WO CONTRAST  Result Date: 08/07/2020 CLINICAL DATA:  PE suspected, established right-sided PE, increased shortness of breath, assess embolus burden EXAM: CT ANGIOGRAPHY CHEST WITH CONTRAST TECHNIQUE: Multidetector CT imaging of the chest was performed using the standard protocol during bolus administration of intravenous contrast. Multiplanar CT image reconstructions and MIPs were obtained to evaluate the vascular anatomy. CONTRAST:  48mL OMNIPAQUE IOHEXOL 350 MG/ML SOLN COMPARISON:  None. FINDINGS: Cardiovascular: Examination for pulmonary embolism is somewhat limited by breath motion artifact. Within this limitation, there are occasional probable subsegmental emboli, for example in the dependent right lower lobe (series 6, image 76) and in the dependent left lower lobe (series 6, image 63). There is no segmental or more central emboli  identified. Aortic atherosclerosis. Normal heart size. Three-vessel coronary artery calcifications. Enlargement of the main pulmonary artery measuring up to 3.6 cm in caliber. Small pericardial effusion. Mediastinum/Nodes: No enlarged mediastinal, hilar, or axillary lymph nodes. Thyroid gland, trachea, and esophagus demonstrate no significant findings. Lungs/Pleura: Moderate centrilobular emphysema. Moderate left pleural effusion and associated atelectasis or consolidation, similar to prior examination. Upper Abdomen: No acute abnormality. Musculoskeletal: No chest wall abnormality. No acute or significant osseous findings. Review of the MIP images confirms the above findings. IMPRESSION: 1. Examination for pulmonary embolism is somewhat limited by breath motion artifact. Within this limitation, there are occasional probable subsegmental emboli present bilaterally. No segmental or more central embolus identified. 2. Moderate left pleural effusion and associated atelectasis or consolidation, similar to prior examination. 3. Emphysema (ICD10-J43.9). 4. Coronary artery disease. Aortic Atherosclerosis (ICD10-I70.0) 5. Enlargement of the main pulmonary artery measuring up to 3.6 cm in caliber, as can be seen in pulmonary hypertension. Electronically Signed   By: Eddie Candle M.D.   On: 08/07/2020 15:33   DG Chest Port 1 View  Result Date: 08/06/2020 CLINICAL DATA:  COVID-19 positive with shortness of breath EXAM: PORTABLE CHEST 1 VIEW COMPARISON:  July 26, 2020 FINDINGS: There is left lower lobe airspace opacity with small left pleural effusion. The lungs elsewhere are clear. The heart is upper normal in size with pulmonary vascularity normal. No adenopathy. There is aortic atherosclerosis. No bone lesions. IMPRESSION: Small left pleural effusion. Airspace opacity in the left lower lobe concerning for pneumonia with likely associated atelectasis. Lungs elsewhere clear. Stable cardiac silhouette. Aortic  Atherosclerosis (ICD10-I70.0). Electronically Signed   By: Lowella Grip III M.D.   On: 08/06/2020 13:31   DG CHEST PORT 1 VIEW  Result Date: 07/24/2020 CLINICAL DATA:  Status post left  thoracentesis EXAM: PORTABLE CHEST 1 VIEW COMPARISON:  07/21/2020 FINDINGS: Trace left pleural effusion, improved status post thoracentesis. No pneumothorax is seen. Right lung is essentially clear. The heart is normal in size.  Thoracic aortic atherosclerosis. IMPRESSION: Trace left pleural effusion, improved status post thoracentesis. No pneumothorax is seen. Electronically Signed   By: Julian Hy M.D.   On: 07/24/2020 13:01   DG Chest Port 1 View  Result Date: 07/21/2020 CLINICAL DATA:  Shortness of breath EXAM: PORTABLE CHEST 1 VIEW COMPARISON:  July 15, 2020 FINDINGS: There is consolidation throughout the left mid and lower lung regions with questionable superimposed left pleural effusion. The right lung is clear. Heart is upper normal in size with pulmonary vascularity normal. No adenopathy. There is aortic atherosclerosis. No bone lesions. IMPRESSION: Consolidation throughout much of the left mid and lower lung regions with suspected small pleural effusions superimposed on the left. Right lung clear. Heart upper normal in size. No adenopathy. Aortic Atherosclerosis (ICD10-I70.0). Electronically Signed   By: Lowella Grip III M.D.   On: 07/21/2020 14:53    Phillips Climes M.D on 08/14/2020 at 1:24 PM  To page go to [email or web address]

## 2020-08-14 NOTE — Plan of Care (Signed)
Patient is currently resting in bed. Denies pain. Q2 turns. VSS. Remains on 3L  Iron. Call bell within reach. Bed alarm on. Encouraging PO intake.   Problem: Education: Goal: Knowledge of General Education information will improve Description: Including pain rating scale, medication(s)/side effects and non-pharmacologic comfort measures Outcome: Progressing   Problem: Health Behavior/Discharge Planning: Goal: Ability to manage health-related needs will improve Outcome: Progressing   Problem: Clinical Measurements: Goal: Ability to maintain clinical measurements within normal limits will improve Outcome: Progressing Goal: Will remain free from infection Outcome: Progressing Goal: Diagnostic test results will improve Outcome: Progressing Goal: Respiratory complications will improve Outcome: Progressing Goal: Cardiovascular complication will be avoided Outcome: Progressing   Problem: Activity: Goal: Risk for activity intolerance will decrease Outcome: Progressing   Problem: Nutrition: Goal: Adequate nutrition will be maintained Outcome: Progressing   Problem: Coping: Goal: Level of anxiety will decrease Outcome: Progressing   Problem: Elimination: Goal: Will not experience complications related to bowel motility Outcome: Progressing Goal: Will not experience complications related to urinary retention Outcome: Progressing   Problem: Pain Managment: Goal: General experience of comfort will improve Outcome: Progressing   Problem: Safety: Goal: Ability to remain free from injury will improve Outcome: Progressing   Problem: Skin Integrity: Goal: Risk for impaired skin integrity will decrease Outcome: Progressing   Problem: Education: Goal: Knowledge of risk factors and measures for prevention of condition will improve Outcome: Progressing   Problem: Coping: Goal: Psychosocial and spiritual needs will be supported Outcome: Progressing   Problem: Respiratory: Goal:  Will maintain a patent airway Outcome: Progressing Goal: Complications related to the disease process, condition or treatment will be avoided or minimized Outcome: Progressing   Problem: Education: Goal: Knowledge of disease or condition will improve Outcome: Progressing Goal: Knowledge of the prescribed therapeutic regimen will improve Outcome: Progressing Goal: Individualized Educational Video(s) Outcome: Progressing   Problem: Activity: Goal: Ability to tolerate increased activity will improve Outcome: Progressing Goal: Will verbalize the importance of balancing activity with adequate rest periods Outcome: Progressing   Problem: Respiratory: Goal: Ability to maintain a clear airway will improve Outcome: Progressing Goal: Levels of oxygenation will improve Outcome: Progressing Goal: Ability to maintain adequate ventilation will improve Outcome: Progressing

## 2020-08-15 DIAGNOSIS — U071 COVID-19: Secondary | ICD-10-CM | POA: Diagnosis not present

## 2020-08-15 DIAGNOSIS — J1282 Pneumonia due to coronavirus disease 2019: Secondary | ICD-10-CM | POA: Diagnosis not present

## 2020-08-15 LAB — BASIC METABOLIC PANEL
Anion gap: 6 (ref 5–15)
BUN: 12 mg/dL (ref 8–23)
CO2: 31 mmol/L (ref 22–32)
Calcium: 8.7 mg/dL — ABNORMAL LOW (ref 8.9–10.3)
Chloride: 97 mmol/L — ABNORMAL LOW (ref 98–111)
Creatinine, Ser: 0.5 mg/dL (ref 0.44–1.00)
GFR, Estimated: >60 mL/min (ref >60)
Glucose, Bld: 97 mg/dL (ref 70–99)
Potassium: 3.5 mmol/L (ref 3.5–5.1)
Sodium: 134 mmol/L — ABNORMAL LOW (ref 135–145)

## 2020-08-15 LAB — GLUCOSE, CAPILLARY
Glucose-Capillary: 98 mg/dL (ref 70–99)
Glucose-Capillary: 98 mg/dL (ref 70–99)
Glucose-Capillary: 139 mg/dL — ABNORMAL HIGH (ref 70–99)
Glucose-Capillary: 90 mg/dL (ref 70–99)

## 2020-08-15 LAB — CBC
HCT: 34.4 % — ABNORMAL LOW (ref 36.0–46.0)
Hemoglobin: 10 g/dL — ABNORMAL LOW (ref 12.0–15.0)
MCH: 23.3 pg — ABNORMAL LOW (ref 26.0–34.0)
MCHC: 29.1 g/dL — ABNORMAL LOW (ref 30.0–36.0)
MCV: 80 fL (ref 80.0–100.0)
Platelets: 284 10*3/uL (ref 150–400)
RBC: 4.3 MIL/uL (ref 3.87–5.11)
RDW: 29.1 % — ABNORMAL HIGH (ref 11.5–15.5)
WBC: 4.7 10*3/uL (ref 4.0–10.5)
nRBC: 0 % (ref 0.0–0.2)

## 2020-08-15 NOTE — Progress Notes (Signed)
PROGRESS NOTE                                                                                                                                                                                                             Patient Demographics:    Jaime Kelley, is a 69 y.o. female, DOB - 07-30-1951, MGN:[id number]  Admit date - 08/06/2020   Admitting Physician Mckinley Jewel, MD  Outpatient Primary MD for the patient is Laurey Morale, MD  LOS - 9  Chief Complaint  Patient presents with  . Shortness of Breath    COVID +       Brief Narrative (HPI from H&P)  - Jaime Kelley is a 69 y.o. female with medical history significant of hypertension, type 2 diabetes mellitus, morbid obesity, depression/anxiety, chronic hypoxemic respiratory failure secondary to underlying COPD-on 5 L of oxygen at baseline, RA, hypothyroidism, history of PE/DVT-on Coumadin, GERD presents to emergency department for evaluation of hypoxia.  She was recently admitted on 9/18 with complaints of substernal chest pressure and was found to have large left-sided pleural effusion with complete collapse of left lung requiring nonrebreather initially.  Patient underwent left pleural space drainage and chest tube placement which was removed on 9/23.  Bronchoscopy was performed on 9/20 with removal of solitary mucous plug.  Repeat chest x-ray showed recurrent opacification.  Pulmonology performed a repeat thoracentesis at bedside on 07/24/2020.  Chest x-ray on 10/5 demonstrated a small amount of reaccumulation of pleural effusion however oxygen requirements improved to 3 L therefore she discharged to SNF in a stable condition and received [facility] Covid vaccine on 10/5.  Work-up here suggestive of acute PE plus minus possible mild Covid pneumonia.    Subjective:   Patient in bed, appears comfortable, she denies any complaints today.     Assessment  & Plan :    Acute on chronic hypoxic respiratory  failure -Multifactorial, in the setting of baseline COPD. -currently with acute PE.  -she was recently admitted with large left-sided pleural effusion requiring thoracentesis and bronchoscopy for removal of solitary mucous plug.  She also recently received Covid vaccine on 07/27/2020.  She is now here with shortness of breath with minimal left-sided pleural effusion and atelectasis.  Her present acute on chronic hypoxia could be due to mild bronchitis + PE , doubt she has true COVID-19 pneumonia, for now since started will finish steroids(taper) and Remdesivir, she is also chronically on Coumadin with subtherapeutic INR, she was  treated initially with Lovenox, now transition to Eliquis,.  Continue supplemental oxygen note she is on 5 L of nasal cannula oxygen at home.     Recent Labs  Lab 08/09/20 0300 08/10/20 0459 08/11/20 0059 08/15/20 0409  WBC  --  3.4* 4.4 4.7  HGB  --  9.7* 10.4* 10.0*  HCT  --  33.3* 36.1 34.4*  PLT  --  246 270 284  CRP 1.6* 1.0* 1.1*  --   BNP  --  43.8 36.8  --   AST 19 19 32  --   ALT 16 20 40  --   ALKPHOS 43 39 48  --   BILITOT 0.4 0.5 0.5  --   ALBUMIN 2.9* 3.0* 3.2*  --   INR 1.8*  --   --   --      Underlying COPD with 5 L nasal cannula oxygen at home.   -Supportive care.  Treated with IV steroids, now transitioned to p.o. prednisone, she is with no wheezing currently  Social/ethics -Patient reports she wants to go home, did not want placement, as well she threatened to leave AMA, though she is awake alert x3 intermittently, she is with some episodes of confusion and delusions intermittently, psychiatry were consulted during previous admission, and this admission as well, for which she has deemed not to have capacity to make her own medical decisions, and placement decisions as well. -Social worker has been consulted, trying to assist with SNF placement. -. Stable TSH, B12 and RPR.  Acute PE with history of DVT. -  Was on Coumadin with  subtherapeutic INR, has a new PE, placed on Lovenox >> Eliquis on 08/09/2020.  Anxiety and depression  -on Zoloft and Xanax.  Continue.  Hypothyroidism.  On Synthroid.  Essential hypertension.  On Norvasc and ARB.  Morbid obesity.  BMI 41.  Follow with PCP.   Hypokalemia -Repleted  DM type II.  Low-dose Lantus and sliding scale and monitor.  Lab Results  Component Value Date   HGBA1C 4.7 (L) 08/07/2020   CBG (last 3)  Recent Labs    08/14/20 1630 08/14/20 2111 08/15/20 0809  GLUCAP 99 137* 98      Condition - Extremely Guarded  Family Communication  :    Discussed with daughter Jaime Kelley 10/23.  Code Status :  Full  Consults  : Psych  Procedures  :    PUD Prophylaxis : PPI  Disposition Plan  :    Status is: Inpatient  Remains inpatient appropriate because:Unsafe d/c plan   Dispo: The patient is from: Home              Anticipated d/c is to: SNF              Anticipated d/c date is: 1 day              Patient currently is medically stable to d/c.  Patient is medically ready for discharge, pending safe discharge plan to a facility, she will need SNF, social worker involved.  DVT Prophylaxis  :  Lovenox >> Coumadin  Lab Results  Component Value Date   INR 1.8 (H) 08/09/2020   INR 1.9 (H) 08/08/2020   INR 1.2 08/07/2020     Lab Results  Component Value Date   PLT 284 08/15/2020    Diet :  Diet Order            Diet heart healthy/carb modified Room service appropriate? Yes; Fluid consistency: Thin  Diet effective now                  Inpatient Medications Scheduled Meds: . aerochamber plus with mask  1 each Other Once  . albuterol  2 puff Inhalation TID  . amLODipine  10 mg Oral Daily  . apixaban  5 mg Oral BID  . vitamin C  500 mg Oral Daily  . dexamethasone  6 mg Oral Daily  . insulin aspart  0-15 Units Subcutaneous TID WC  . insulin aspart  0-5 Units Subcutaneous QHS  . insulin glargine  5 Units Subcutaneous Daily  .  levothyroxine  137 mcg Oral QAC breakfast  . losartan  100 mg Oral Daily  . pantoprazole  40 mg Oral Daily  . sertraline  100 mg Oral Daily  . umeclidinium bromide  1 puff Inhalation Daily  . zinc sulfate  220 mg Oral Daily   Continuous Infusions: . sodium chloride     PRN Meds:.sodium chloride, acetaminophen, albuterol, ALPRAZolam, chlorpheniramine-HYDROcodone, guaiFENesin-dextromethorphan, [DISCONTINUED] ondansetron **OR** ondansetron (ZOFRAN) IV  Antibiotics  :   Anti-infectives (From admission, onward)   Start     Dose/Rate Route Frequency Ordered Stop   08/07/20 1000  remdesivir 100 mg in sodium chloride 0.9 % 100 mL IVPB       "Followed by" Linked Group Details   100 mg 200 mL/hr over 30 Minutes Intravenous Daily 08/06/20 1449 08/10/20 0913   08/07/20 1000  remdesivir 100 mg in sodium chloride 0.9 % 100 mL IVPB  Status:  Discontinued       "Followed by" Linked Group Details   100 mg 200 mL/hr over 30 Minutes Intravenous Daily 08/06/20 1503 08/06/20 1506   08/06/20 1700  remdesivir 200 mg in sodium chloride 0.9% 250 mL IVPB       "Followed by" Linked Group Details   200 mg 580 mL/hr over 30 Minutes Intravenous Once 08/06/20 1449 08/07/20 0345   08/06/20 1515  remdesivir 200 mg in sodium chloride 0.9% 250 mL IVPB  Status:  Discontinued       "Followed by" Linked Group Details   200 mg 580 mL/hr over 30 Minutes Intravenous Once 08/06/20 1503 08/06/20 1506          Objective:   Vitals:   08/14/20 2011 08/15/20 0013 08/15/20 0437 08/15/20 0808  BP: (!) 97/59 (!) 132/53 128/66 (!) 144/69  Pulse: 90 68 71 72  Resp: 19 18 19 17   Temp: 98.4 F (36.9 C) 97.6 F (36.4 C) 98.4 F (36.9 C) 97.9 F (36.6 C)  TempSrc: Oral Axillary Oral Oral  SpO2: 95% 100% 99% 99%  Weight:      Height:        SpO2: 99 % O2 Flow Rate (L/min): 3 L/min  Wt Readings from Last 3 Encounters:  08/06/20 95.3 kg  07/27/20 81.6 kg  10/24/18 103.1 kg     Intake/Output Summary (Last 24  hours) at 08/15/2020 1153 Last data filed at 08/14/2020 1340 Gross per 24 hour  Intake 120 ml  Output --  Net 120 ml     Physical Exam  Awake, alert, no apparent distress, oriented x2, confused, impaired cognition and insight  Good air entry bilaterally Regular rate and rhythm Abdomen soft Remedies with no edema      Pressure Injury 07/10/20 Buttocks Left Stage 2 -  Partial thickness loss of dermis presenting as a shallow open injury with a red, pink wound bed without slough. (Active)  07/10/20 2330  Location:  Buttocks  Location Orientation: Left  Staging: Stage 2 -  Partial thickness loss of dermis presenting as a shallow open injury with a red, pink wound bed without slough.  Wound Description (Comments):   Present on Admission: Yes     Pressure Injury 07/11/20 Buttocks Left Stage 2 -  Partial thickness loss of dermis presenting as a shallow open injury with a red, pink wound bed without slough. (Active)  07/11/20 2330  Location: Buttocks  Location Orientation: Left  Staging: Stage 2 -  Partial thickness loss of dermis presenting as a shallow open injury with a red, pink wound bed without slough.  Wound Description (Comments):   Present on Admission: Yes     Data Review:   Recent Labs  Lab 08/10/20 0459 08/11/20 0059 08/15/20 0409  WBC 3.4* 4.4 4.7  HGB 9.7* 10.4* 10.0*  HCT 33.3* 36.1 34.4*  PLT 246 270 284  MCV 78.0* 79.7* 80.0  MCH 22.7* 23.0* 23.3*  MCHC 29.1* 28.8* 29.1*  RDW Not Measured 30.1* 29.1*  LYMPHSABS 1.9 2.0  --   MONOABS 0.4 0.4  --   EOSABS 0.0 0.0  --   BASOSABS 0.0 0.0  --     Recent Labs  Lab 08/09/20 0300 08/10/20 0459 08/11/20 0059 08/15/20 0409  NA 134* 133* 133* 134*  K 3.9 3.1* 3.9 3.5  CL 94* 92* 95* 97*  CO2 31 30 29 31   GLUCOSE 95 81 80 97  BUN 9 10 10 12   CREATININE 0.46 0.43* 0.41* 0.50  CALCIUM 8.6* 8.7* 8.8* 8.7*  AST 19 19 32  --   ALT 16 20 40  --   ALKPHOS 43 39 48  --   BILITOT 0.4 0.5 0.5  --    ALBUMIN 2.9* 3.0* 3.2*  --   MG 1.9 1.8 1.9  --   CRP 1.6* 1.0* 1.1*  --   INR 1.8*  --   --   --   BNP  --  43.8 36.8  --     Recent Labs  Lab 08/09/20 0300 08/10/20 0459 08/11/20 0059  CRP 1.6* 1.0* 1.1*  BNP  --  43.8 36.8    ------------------------------------------------------------------------------------------------------------------ No results for input(s): CHOL, HDL, LDLCALC, TRIG, CHOLHDL, LDLDIRECT in the last 72 hours.  Lab Results  Component Value Date   HGBA1C 4.7 (L) 08/07/2020   ------------------------------------------------------------------------------------------------------------------ No results for input(s): TSH, T4TOTAL, T3FREE, THYROIDAB in the last 72 hours.  Invalid input(s): FREET3 ------------------------------------------------------------------------------------------------------------------ No results for input(s): VITAMINB12, FOLATE, FERRITIN, TIBC, IRON, RETICCTPCT in the last 72 hours.  Coagulation profile Recent Labs  Lab 08/09/20 0300  INR 1.8*    No results for input(s): DDIMER in the last 72 hours.  Cardiac Enzymes No results for input(s): CKMB, TROPONINI, MYOGLOBIN in the last 168 hours.  Invalid input(s): CK ------------------------------------------------------------------------------------------------------------------    Component Value Date/Time   BNP 36.8 08/11/2020 0059    Micro Results Recent Results (from the past 240 hour(s))  Blood Culture (routine x 2)     Status: None   Collection Time: 08/06/20  1:00 PM   Specimen: BLOOD  Result Value Ref Range Status   Specimen Description BLOOD RIGHT ANTECUBITAL  Final   Special Requests   Final    BOTTLES DRAWN AEROBIC AND ANAEROBIC Blood Culture results may not be optimal due to an inadequate volume of blood received in culture bottles   Culture   Final    NO GROWTH 5 DAYS Performed at [facility],  [address], [place], [place] [address]    Report  Status 08/11/2020 FINAL  Final  Respiratory Panel by RT PCR (Flu A&B, Covid) - Nasopharyngeal Swab     Status: Abnormal   Collection Time: 08/06/20  1:05 PM   Specimen: Nasopharyngeal Swab  Result Value Ref Range Status   SARS Coronavirus 2 by RT PCR POSITIVE (A) NEGATIVE Final    Comment: emailed L. Berdik RN 14:40 08/06/20 ([email or web address]) (NOTE) SARS-CoV-2 target nucleic acids are DETECTED.  SARS-CoV-2 RNA is generally detectable in upper respiratory specimens  during the acute phase of infection. Positive results are indicative of the presence of the identified virus, but do not rule out bacterial infection or co-infection with other pathogens not detected by the test. Clinical correlation with patient history and other diagnostic information is necessary to determine patient infection status. The expected result is Negative.  Fact Sheet for Patients:  PinkCheek.be  Fact Sheet for Healthcare Providers: GravelBags.it  This test is not yet approved or cleared by the [place] FDA and  has been authorized for detection and/or diagnosis of SARS-CoV-2 by FDA under an Emergency Use Authorization (EUA).  This EUA will remain in effect (meaning this test can be used) for the duration of  the COVID-19  declaration under Section 564(b)(1) of the Act, 21 U.S.C. section 360bbb-3(b)(1), unless the authorization is terminated or revoked sooner.      Influenza A by PCR NEGATIVE NEGATIVE Final   Influenza B by PCR NEGATIVE NEGATIVE Final    Comment: (NOTE) The Xpert Xpress SARS-CoV-2/FLU/RSV assay is intended as an aid in  the diagnosis of influenza from Nasopharyngeal swab specimens and  should not be used as a sole basis for treatment. Nasal washings and  aspirates are unacceptable for Xpert Xpress SARS-CoV-2/FLU/RSV  testing.  Fact Sheet for Patients: PinkCheek.be  Fact Sheet for Healthcare  Providers: GravelBags.it  This test is not yet approved or cleared by the [place] FDA and  has been authorized for detection and/or diagnosis of SARS-CoV-2 by  FDA under an Emergency Use Authorization (EUA). This EUA will remain  in effect (meaning this test can be used) for the duration of the  Covid-19 declaration under Section 564(b)(1) of the Act, 21  U.S.C. section 360bbb-3(b)(1), unless the authorization is  terminated or revoked. Performed at [facility], [place] [address] [place], [place] [address]   Blood Culture (routine x 2)     Status: None   Collection Time: 08/06/20  1:22 PM   Specimen: BLOOD RIGHT WRIST  Result Value Ref Range Status   Specimen Description BLOOD RIGHT WRIST  Final   Special Requests   Final    BOTTLES DRAWN AEROBIC AND ANAEROBIC Blood Culture results may not be optimal due to an inadequate volume of blood received in culture bottles   Culture   Final    NO GROWTH 5 DAYS Performed at [facility], [place] [address], [place], [place] [address]    Report Status 08/11/2020 FINAL  Final  MRSA PCR Screening     Status: None   Collection Time: 08/07/20  6:23 AM   Specimen: Nasal Mucosa; Nasopharyngeal  Result Value Ref Range Status   MRSA by PCR NEGATIVE NEGATIVE Final    Comment:        The GeneXpert MRSA Assay (FDA approved for NASAL specimens only), is one component of a comprehensive MRSA colonization surveillance program. It is not intended to diagnose MRSA infection nor to guide or  monitor treatment for MRSA infections. Performed at [facility], [place] [address], [place], [place] [address]     Radiology Reports DG Chest 2 View  Result Date: 07/26/2020 CLINICAL DATA:  Left pleural effusion EXAM: CHEST - 2 VIEW COMPARISON:  07/24/2020 FINDINGS: Small left pleural effusion is increased. Left basilar atelectasis is present. No pneumothorax. Background changes of emphysema. Stable cardiomediastinal  contours. IMPRESSION: Small left pleural effusion increased since 07/24/2020. Mild left basilar atelectasis. Electronically Signed   By: Macy Mis M.D.   On: 07/26/2020 08:07   CT CHEST WO CONTRAST  Result Date: 07/23/2020 CLINICAL DATA:  Abnormal radiograph, pleural effusion EXAM: CT CHEST WITHOUT CONTRAST TECHNIQUE: Multidetector CT imaging of the chest was performed following the standard protocol without IV contrast. COMPARISON:  Radiograph 07/21/2020, CT 07/10/2020 FINDINGS: Cardiovascular: Cardiac size within normal limits. Small volume pericardial fluid is similar to prior. Dense calcifications along the mitral annulus. Additional calcifications of the aortic leaflets. Hypoattenuation of the cardiac blood pool compatible with anemia. Extensive three-vessel native coronary artery atherosclerosis. Atherosclerotic plaque within the normal caliber aorta. No hyperdense mural thickening or plaque displacement nor periaortic stranding or hemorrhage. Shared origin of the brachiocephalic and left common carotid arteries. Additional calcifications seen throughout the proximal great vessels. Central pulmonary arteries are normal caliber. Luminal evaluation precluded in the absence of contrast media. No visible worrisome venous abnormalities. Mediastinum/Nodes: Persistent slight leftward mediastinal shift, similar to comparison. No mediastinal fluid, gas or hemorrhage. Redemonstration of multiple asymmetrically prominent though non pathologically enlarged left axillary and mediastinal lymph nodes. No right axillary adenopathy. Hilar nodal is severely limited in the absence of contrast media. No acute abnormality of the trachea or esophagus. Thyroid gland is diminutive, correlate for prior thyroidectomy or ablation. Lungs/Pleura: Background of moderate centrilobular emphysematous changes throughout both lungs. There is a moderate residual left pleural effusion with adjacent areas of passive atelectasis in the  left lung base. Some additional thickening is noted along the left major fissure which may reflect some persistent loculated fluid. Adjacent atelectatic changes are present along the left major fissure as well. Predominantly involving the lingula and posterior segment left upper lobe. Underlying consolidation would be difficult to exclude. Some there is a stable sub solid opacity in the posterosuperior segment right lower lobe (4/74 measuring up to 8 mm. Possibly post infectious or inflammatory. Stable scarring within the anterior basal segment right lower lobe and major fissure. Right lung is otherwise clear. Upper Abdomen: Multiple calcified gallstones within the largely decompressed gallbladder. Scattered colonic diverticula without focal inflammation to suggest diverticulitis. Extensive upper abdominal atherosclerotic calcification. Stable likely parapelvic cyst in the upper pole left kidney. Musculoskeletal: Mild body wall edema, left slightly more pronounced than right. No acute abnormality or worrisome osseous lesions. Levocurvature of the upper thoracic spine with some mild dextrocurvature of the mid to lower levels. Multilevel discogenic and facet degenerative changes are present. Additional degenerative changes of the right shoulder and sternoclavicular joints similar to prior. IMPRESSION: 1. Moderate residual left pleural effusion with adjacent areas of passive atelectasis in the left lung base. Some additional thickening along the left major fissure may reflect some persistent loculated fluid with some further atelectatic change in the lingula as well. Underlying consolidation within the atelectatic lung would cannot be fully excluded. 2. Stable appearance of an 8 mm ground-glass opacity in the posterosuperior segment right lower lobe. Possibly post infectious or inflammatory. Recommend attention on follow-up imaging. Initial follow-up with CT at 6-12 months is recommended to confirm persistence.  If  persistent, repeat CT is recommended every 2 years until 5 years of stability has been established. This recommendation follows the consensus statement: Guidelines for Management of Incidental Pulmonary Nodules Detected on CT Images: From the Fleischner Society 2017; Radiology 2017; 284:228-243. 3. Redemonstration of multiple asymmetrically prominent though non pathologically enlarged left axillary and mediastinal lymph nodes, nonspecific though possibly reactive or edematous. 4. Cholelithiasis without evidence of acute cholecystitis. 5. Colonic diverticulosis without evidence of diverticulitis. 6. Aortic Atherosclerosis (ICD10-I70.0) 7. Three-vessel coronary artery calcifications. 8. Emphysema (ICD10-J43.9). Electronically Signed   By: Lovena Le M.D.   On: 07/23/2020 22:26   CT ANGIO CHEST PE W OR WO CONTRAST  Result Date: 08/07/2020 CLINICAL DATA:  PE suspected, established right-sided PE, increased shortness of breath, assess embolus burden EXAM: CT ANGIOGRAPHY CHEST WITH CONTRAST TECHNIQUE: Multidetector CT imaging of the chest was performed using the standard protocol during bolus administration of intravenous contrast. Multiplanar CT image reconstructions and MIPs were obtained to evaluate the vascular anatomy. CONTRAST:  25mL OMNIPAQUE IOHEXOL 350 MG/ML SOLN COMPARISON:  None. FINDINGS: Cardiovascular: Examination for pulmonary embolism is somewhat limited by breath motion artifact. Within this limitation, there are occasional probable subsegmental emboli, for example in the dependent right lower lobe (series 6, image 76) and in the dependent left lower lobe (series 6, image 63). There is no segmental or more central emboli identified. Aortic atherosclerosis. Normal heart size. Three-vessel coronary artery calcifications. Enlargement of the main pulmonary artery measuring up to 3.6 cm in caliber. Small pericardial effusion. Mediastinum/Nodes: No enlarged mediastinal, hilar, or axillary lymph nodes.  Thyroid gland, trachea, and esophagus demonstrate no significant findings. Lungs/Pleura: Moderate centrilobular emphysema. Moderate left pleural effusion and associated atelectasis or consolidation, similar to prior examination. Upper Abdomen: No acute abnormality. Musculoskeletal: No chest wall abnormality. No acute or significant osseous findings. Review of the MIP images confirms the above findings. IMPRESSION: 1. Examination for pulmonary embolism is somewhat limited by breath motion artifact. Within this limitation, there are occasional probable subsegmental emboli present bilaterally. No segmental or more central embolus identified. 2. Moderate left pleural effusion and associated atelectasis or consolidation, similar to prior examination. 3. Emphysema (ICD10-J43.9). 4. Coronary artery disease. Aortic Atherosclerosis (ICD10-I70.0) 5. Enlargement of the main pulmonary artery measuring up to 3.6 cm in caliber, as can be seen in pulmonary hypertension. Electronically Signed   By: Eddie Candle M.D.   On: 08/07/2020 15:33   DG Chest Port 1 View  Result Date: 08/06/2020 CLINICAL DATA:  COVID-19 positive with shortness of breath EXAM: PORTABLE CHEST 1 VIEW COMPARISON:  July 26, 2020 FINDINGS: There is left lower lobe airspace opacity with small left pleural effusion. The lungs elsewhere are clear. The heart is upper normal in size with pulmonary vascularity normal. No adenopathy. There is aortic atherosclerosis. No bone lesions. IMPRESSION: Small left pleural effusion. Airspace opacity in the left lower lobe concerning for pneumonia with likely associated atelectasis. Lungs elsewhere clear. Stable cardiac silhouette. Aortic Atherosclerosis (ICD10-I70.0). Electronically Signed   By: Lowella Grip III M.D.   On: 08/06/2020 13:31   DG CHEST PORT 1 VIEW  Result Date: 07/24/2020 CLINICAL DATA:  Status post left thoracentesis EXAM: PORTABLE CHEST 1 VIEW COMPARISON:  07/21/2020 FINDINGS: Trace left pleural  effusion, improved status post thoracentesis. No pneumothorax is seen. Right lung is essentially clear. The heart is normal in size.  Thoracic aortic atherosclerosis. IMPRESSION: Trace left pleural effusion, improved status post thoracentesis. No pneumothorax is seen. Electronically Signed   By: Julian Hy  M.D.   On: 07/24/2020 13:01   DG Chest Port 1 View  Result Date: 07/21/2020 CLINICAL DATA:  Shortness of breath EXAM: PORTABLE CHEST 1 VIEW COMPARISON:  July 15, 2020 FINDINGS: There is consolidation throughout the left mid and lower lung regions with questionable superimposed left pleural effusion. The right lung is clear. Heart is upper normal in size with pulmonary vascularity normal. No adenopathy. There is aortic atherosclerosis. No bone lesions. IMPRESSION: Consolidation throughout much of the left mid and lower lung regions with suspected small pleural effusions superimposed on the left. Right lung clear. Heart upper normal in size. No adenopathy. Aortic Atherosclerosis (ICD10-I70.0). Electronically Signed   By: Lowella Grip III M.D.   On: 07/21/2020 14:53    Phillips Climes M.D on 08/15/2020 at 11:53 AM  To page go to [email or web address]

## 2020-08-15 NOTE — Plan of Care (Signed)
Patient is currently resting in bed. Denies pain. Repositioned for comfort. VSS. Remains on 3L [place]. Purewick in place. No complaints overnight. Call bell within reach. Bed alarm on.   Problem: Education: Goal: Knowledge of General Education information will improve Description: Including pain rating scale, medication(s)/side effects and non-pharmacologic comfort measures Outcome: Progressing   Problem: Health Behavior/Discharge Planning: Goal: Ability to manage health-related needs will improve Outcome: Progressing   Problem: Clinical Measurements: Goal: Ability to maintain clinical measurements within normal limits will improve Outcome: Progressing Goal: Will remain free from infection Outcome: Progressing Goal: Diagnostic test results will improve Outcome: Progressing Goal: Respiratory complications will improve Outcome: Progressing Goal: Cardiovascular complication will be avoided Outcome: Progressing   Problem: Activity: Goal: Risk for activity intolerance will decrease Outcome: Progressing   Problem: Nutrition: Goal: Adequate nutrition will be maintained Outcome: Progressing   Problem: Coping: Goal: Level of anxiety will decrease Outcome: Progressing   Problem: Elimination: Goal: Will not experience complications related to bowel motility Outcome: Progressing Goal: Will not experience complications related to urinary retention Outcome: Progressing   Problem: Pain Managment: Goal: General experience of comfort will improve Outcome: Progressing   Problem: Safety: Goal: Ability to remain free from injury will improve Outcome: Progressing   Problem: Skin Integrity: Goal: Risk for impaired skin integrity will decrease Outcome: Progressing   Problem: Education: Goal: Knowledge of risk factors and measures for prevention of condition will improve Outcome: Progressing   Problem: Coping: Goal: Psychosocial and spiritual needs will be supported Outcome:  Progressing   Problem: Respiratory: Goal: Will maintain a patent airway Outcome: Progressing Goal: Complications related to the disease process, condition or treatment will be avoided or minimized Outcome: Progressing   Problem: Education: Goal: Knowledge of disease or condition will improve Outcome: Progressing Goal: Knowledge of the prescribed therapeutic regimen will improve Outcome: Progressing Goal: Individualized Educational Video(s) Outcome: Progressing   Problem: Activity: Goal: Ability to tolerate increased activity will improve Outcome: Progressing Goal: Will verbalize the importance of balancing activity with adequate rest periods Outcome: Progressing   Problem: Respiratory: Goal: Ability to maintain a clear airway will improve Outcome: Progressing Goal: Levels of oxygenation will improve Outcome: Progressing Goal: Ability to maintain adequate ventilation will improve Outcome: Progressing

## 2020-08-15 NOTE — Progress Notes (Signed)
Nurse and NT has asked pt to get out of bed and sit up in recliner chair. Pt has refused.

## 2020-08-16 ENCOUNTER — Ambulatory Visit: Payer: Self-pay | Admitting: General Practice

## 2020-08-16 DIAGNOSIS — J439 Emphysema, unspecified: Secondary | ICD-10-CM | POA: Diagnosis not present

## 2020-08-16 LAB — GLUCOSE, CAPILLARY
Glucose-Capillary: 86 mg/dL (ref 70–99)
Glucose-Capillary: 82 mg/dL (ref 70–99)
Glucose-Capillary: 137 mg/dL — ABNORMAL HIGH (ref 70–99)
Glucose-Capillary: 92 mg/dL (ref 70–99)

## 2020-08-16 MED ORDER — ALBUTEROL SULFATE HFA 108 (90 BASE) MCG/ACT IN AERS: 2.0000 | INHALATION_SPRAY | RESPIRATORY_TRACT | Status: DC | PRN

## 2020-08-16 NOTE — TOC Progression Note (Signed)
Transition of Care [facility]) - Progression Note    Patient Details  Name: Jaime Kelley MRN: [id number] Date of Birth: Aug 16, 1951  Transition [facility]) CM/SW Contact  Sharlet Salina Mila Homer, LCSW Phone Number: 08/16/2020, 4:06 PM  Clinical Narrative:  Call made to [facility] - [phone] ([place]) and spoke with admissions director Tiffany regarding patient. She requested that additional clinicals be sent to her for the last 2/3 days (fax [phone]). Clinicals faxed.  A CSW will follow-up with the [facility] regarding patient and bed availability.    Expected Discharge Plan: Skilled Nursing Facility Barriers to Discharge: Family Issues, Unsafe home situation, SNF Pending bed offer  Expected Discharge Plan and Services Expected Discharge Plan: [place] In-house Referral: Clinical Social Work   Post Acute Care Choice: [place] Living arrangements for the past 2 months: [place], Single Family Home                                     Social Determinants of Health (SDOH) Interventions  No SDOH interventions requested or needed at this time  Readmission Risk Interventions Readmission Risk Prevention Plan 08/10/2020 07/27/2020  Transportation Screening Complete Complete  PCP or Specialist Appt within 3-5 Days - Complete  HRI or [place] - Complete  Social Work Consult for [place] Planning/Counseling - Complete  Palliative Care Screening - Not Applicable  Medication Review Press photographer) Referral to Pharmacy Complete  PCP or Specialist appointment within 3-5 days of discharge Complete -  [place] or Home Care Consult Complete -  SW Recovery Care/Counseling Consult Complete -  Palliative Care Screening Not Applicable -  Skilled Nursing Facility Complete -  Some recent data might be hidden

## 2020-08-16 NOTE — Progress Notes (Signed)
Physical Therapy Treatment Patient Details Name: Jaime Kelley MRN: [id number] DOB: 1951-04-24 Today's Date: 08/16/2020    History of Present Illness Pt is a 69 y.o. female with recent admission 07/10/20-07/27/20 with large L-side pleural effusion with d/c to SNF, now readmitted from [facility] 08/06/20 with hypoxia; pt had tested (+) COVID-19 on 10/14. Workup for COVID-19 PNA; chronic hypoxemic respiratory failure. +PE 10/16  PMH includes COPD (5L O2 baseline), RA, PE/DVT on Coumadin, DM2, HTN, obesity, depression/anxiety; pt vaccinated 10/5.    PT Comments    Patient a bit more alert and conversational. She reports continued severe left knee pain (audible and palpable crunching with knee extension). She was agreeable to sitting EOB and performing exercises for strengthening. LE exercises limited due to left knee pain. EOB ~12 minutes with remaining exercises in supine/chair position in bed.      Follow Up Recommendations  SNF;Supervision/Assistance - 24 hour     Equipment Recommendations   (defer)    Recommendations for Other Services       Precautions / Restrictions Precautions Precautions: Fall;Other (comment) Precaution Comments: Significant WOB at rest; chronic painful/tender L knee ("I am supposed to have that knee replaced")    Mobility  Bed Mobility Overal bed mobility: Needs Assistance Bed Mobility: Rolling;Sidelying to Sit;Sit to Sidelying Rolling: Supervision (with rail) Sidelying to sit: Mod assist;HOB elevated (with rail)     Sit to sidelying: Min assist General bed mobility comments: vc only for rolling to left; light assist for legs over EOB, incr assist to raise torso to sit; return to sidelying wiht min assist to LLE only  Transfers                    Ambulation/Gait             General Gait Details: refuses to bear weight due to knee pain   Stairs             Wheelchair Mobility    Modified Rankin (Stroke Patients  Only)       Balance Overall balance assessment: Needs assistance Sitting-balance support: Feet supported;No upper extremity supported Sitting balance-[name]: Fair                                      Cognition Arousal/Alertness: Awake/alert Behavior During Therapy: Flat affect Overall Cognitive Status: No family/caregiver present to determine baseline cognitive functioning Area of Impairment: Following commands;Safety/judgement;Awareness;Problem solving;Attention;Memory;Orientation                 Orientation Level: Time;Situation;Disoriented to (knew "hospital" but not which one/where) Current Attention Level: Sustained;Selective Memory: Decreased short-term memory Following Commands: Follows one step commands with increased time Safety/Judgement: Decreased awareness of safety;Decreased awareness of deficits Awareness: Intellectual Problem Solving: Slow processing;Decreased initiation;Difficulty sequencing;Requires verbal cues;Requires tactile cues General Comments: Stayed on topic of discussion better today. Stated she was saving her banana for her friend (no visitors allowed on COVID unit x 10 days for her).       Exercises General Exercises - Upper Extremity Elbow Flexion: AROM;Both;5 reps;Supine Elbow Extension: AROM;Both;5 reps;Supine;Strengthening (resisted extension) General Exercises - Lower Extremity Ankle Circles/Pumps: AROM;Both;10 reps;Seated Short Arc Quad: AAROM;Both;5 reps Other Exercises Other Exercises: IS x 1000 ml x 5 reps    General Comments General comments (skin integrity, edema, etc.): incr time to keep on task and to complete reps      Pertinent Vitals/Pain Pain  Assessment: Faces Faces Pain Scale: Hurts whole lot Pain Location: L knee Pain Descriptors / Indicators: Discomfort;Grimacing;Guarding;Tender;Moaning Pain Intervention(s): Limited activity within patient's tolerance;Monitored during session;Repositioned     Home Living                      Prior Function            PT Goals (current goals can now be found in the care plan section) Acute Rehab PT Goals Patient Stated Goal: "I want to get back to everything I was doing before" (including cooking, baking) Time For Goal Achievement: 08/21/20 Potential to Achieve Goals: Poor Progress towards PT goals: Progressing toward goals    Frequency    Min 2X/week      PT Plan Current plan remains appropriate    Co-evaluation              AM-PAC PT "6 Clicks" Mobility   Outcome Measure  Help needed turning from your back to your side while in a flat bed without using bedrails?: A Little Help needed moving from lying on your back to sitting on the side of a flat bed without using bedrails?: A Lot Help needed moving to and from a bed to a chair (including a wheelchair)?: Total Help needed standing up from a chair using your arms (e.g., wheelchair or bedside chair)?: Total Help needed to walk in hospital room?: Total Help needed climbing 3-5 steps with a railing? : Total 6 Click Score: 9    End of Session Equipment Utilized During Treatment: Oxygen Activity Tolerance: Patient limited by pain (left knee worse than rt knee) Patient left: with call bell/phone within reach;in bed;with bed alarm set (in chair position) Nurse Communication: Mobility status;Need for lift equipment PT Visit Diagnosis: Muscle weakness (generalized) (M62.81)     Time: 0601-5615 PT Time Calculation (min) (ACUTE ONLY): 45 min  Charges:  $Therapeutic Exercise: 8-22 mins $Therapeutic Activity: 23-37 mins                      Jaime Kelley, PT Pager [phone]    Jaime Kelley 08/16/2020, 3:28 PM

## 2020-08-16 NOTE — Care Management Important Message (Signed)
Important Message  Patient Details  Name: Jaime Kelley MRN: [id number] Date of Birth: 1951/01/17   Medicare Important Message Given:  Yes - Important Message mailed due to current National Emergency  Verbal consent obtained due to current National Emergency  Relationship to patient: Self Contact Name: Adalay Azucena Call Date: 08/16/20  Time: 1540 Phone: [phone] Outcome: Spoke with contact Important Message mailed to: Patient address on file    Delorse Lek 08/16/2020, 3:40 PM

## 2020-08-16 NOTE — Progress Notes (Signed)
PROGRESS NOTE                                                                                                                                                                                                             Patient Demographics:    Jaime Kelley, is a 69 y.o. female, DOB - 1951/07/07, FFM:[id number]  Admit date - 08/06/2020   Admitting Physician Mckinley Jewel, MD  Outpatient Primary MD for the patient is Laurey Morale, MD  LOS - 10  Chief Complaint  Patient presents with  . Shortness of Breath    COVID +       Brief Narrative (HPI from H&P)  - Jaime Kelley is a 69 y.o. female with medical history significant of hypertension, type 2 diabetes mellitus, morbid obesity, depression/anxiety, chronic hypoxemic respiratory failure secondary to underlying COPD-on 5 L of oxygen at baseline, RA, hypothyroidism, history of PE/DVT-on Coumadin, GERD presents to emergency department for evaluation of hypoxia.  She was recently admitted on 9/18 with complaints of substernal chest pressure and was found to have large left-sided pleural effusion with complete collapse of left lung requiring nonrebreather initially.  Patient underwent left pleural space drainage and chest tube placement which was removed on 9/23.  Bronchoscopy was performed on 9/20 with removal of solitary mucous plug.  Repeat chest x-ray showed recurrent opacification.  Pulmonology performed a repeat thoracentesis at bedside on 07/24/2020.  Chest x-ray on 10/5 demonstrated a small amount of reaccumulation of pleural effusion however oxygen requirements improved to 3 L therefore she discharged to SNF in a stable condition and received [place] Covid vaccine on 10/5.  Work-up here suggestive of acute PE plus minus possible mild Covid pneumonia.    Subjective:   Patient in bed, appears comfortable, she denies any complaints today.     Assessment  & Plan :    Acute on chronic hypoxic respiratory  failure -Multifactorial, in the setting of baseline COPD. -currently with acute PE.  -she was recently admitted with large left-sided pleural effusion requiring thoracentesis and bronchoscopy for removal of solitary mucous plug.  She also recently received Covid vaccine on 07/27/2020.  She is now here with shortness of breath with minimal left-sided pleural effusion and atelectasis.  Her present acute on chronic hypoxia could be due to mild bronchitis + PE , doubt she has true COVID-19 pneumonia, for now since started will finish steroids(taper) and Remdesivir, she is also chronically on Coumadin with subtherapeutic INR, she was  treated initially with Lovenox, now transition to Eliquis,.  Continue supplemental oxygen note she is on 5 L of nasal cannula oxygen at home.     Recent Labs  Lab 08/10/20 0459 08/11/20 0059 08/15/20 0409  WBC 3.4* 4.4 4.7  HGB 9.7* 10.4* 10.0*  HCT 33.3* 36.1 34.4*  PLT 246 270 284  CRP 1.0* 1.1*  --   BNP 43.8 36.8  --   AST 19 32  --   ALT 20 40  --   ALKPHOS 39 48  --   BILITOT 0.5 0.5  --   ALBUMIN 3.0* 3.2*  --      Underlying COPD with 5 L nasal cannula oxygen at home.   -Supportive care.  Treated with IV steroids, now transitioned to p.o. steroids, she is with no wheezing, her steroids has been stopped as of 10/25.  Social/ethics -Patient reports she wants to go home, did not want placement, as well she threatened to leave AMA, though she is awake alert x3 intermittently, she is with some episodes of confusion and delusions intermittently, psychiatry were consulted during previous admission, and this admission as well, for which she has deemed not to have capacity to make her own medical decisions, and placement decisions as well. -Social worker has been consulted, trying to assist with SNF placement. -. Stable TSH, B12 and RPR.  Acute PE with history of DVT. -  Was on Coumadin with subtherapeutic INR, has a new PE, placed on Lovenox >> Eliquis on  08/09/2020.  Anxiety and depression  -on Zoloft and Xanax.  Continue.  Hypothyroidism.  On Synthroid.  Essential hypertension.  On Norvasc and ARB.  Morbid obesity.  BMI 41.  Follow with PCP.   Hypokalemia -Repleted  DM type II.  Low-dose Lantus and sliding scale and monitor.  Lab Results  Component Value Date   HGBA1C 4.7 (L) 08/07/2020   CBG (last 3)  Recent Labs    08/15/20 2103 08/16/20 0719 08/16/20 1147  GLUCAP 90 86 82      Condition - Extremely Guarded  Family Communication  :    Discussed with daughter [name] 10/23.  Code Status :  Full  Consults  : Psych  Procedures  :    PUD Prophylaxis : PPI  Disposition Plan  :    Status is: Inpatient  Remains inpatient appropriate because:Unsafe d/c plan   Dispo: The patient is from: Home              Anticipated d/c is to: SNF              Anticipated d/c date is: 1 day              Patient currently is medically stable to d/c.  Patient is medically ready for discharge, pending safe discharge plan to a facility, she will need SNF, social worker involved.  DVT Prophylaxis  :  Lovenox >> Coumadin  Lab Results  Component Value Date   INR 1.8 (H) 08/09/2020   INR 1.9 (H) 08/08/2020   INR 1.2 08/07/2020     Lab Results  Component Value Date   PLT 284 08/15/2020    Diet :  Diet Order            Diet heart healthy/carb modified Room service appropriate? Yes; Fluid consistency: Thin  Diet effective now                  Inpatient Medications Scheduled Meds: . aerochamber plus with  mask  1 each Other Once  . amLODipine  10 mg Oral Daily  . apixaban  5 mg Oral BID  . vitamin C  500 mg Oral Daily  . dexamethasone  6 mg Oral Daily  . insulin aspart  0-15 Units Subcutaneous TID WC  . insulin aspart  0-5 Units Subcutaneous QHS  . insulin glargine  5 Units Subcutaneous Daily  . levothyroxine  137 mcg Oral QAC breakfast  . losartan  100 mg Oral Daily  . pantoprazole  40 mg Oral Daily  .  sertraline  100 mg Oral Daily  . umeclidinium bromide  1 puff Inhalation Daily  . zinc sulfate  220 mg Oral Daily   Continuous Infusions: . sodium chloride     PRN Meds:.sodium chloride, acetaminophen, albuterol, ALPRAZolam, chlorpheniramine-HYDROcodone, guaiFENesin-dextromethorphan, [DISCONTINUED] ondansetron **OR** ondansetron (ZOFRAN) IV  Antibiotics  :   Anti-infectives (From admission, onward)   Start     Dose/Rate Route Frequency Ordered Stop   08/07/20 1000  remdesivir 100 mg in sodium chloride 0.9 % 100 mL IVPB       "Followed by" Linked Group Details   100 mg 200 mL/hr over 30 Minutes Intravenous Daily 08/06/20 1449 08/10/20 0913   08/07/20 1000  remdesivir 100 mg in sodium chloride 0.9 % 100 mL IVPB  Status:  Discontinued       "Followed by" Linked Group Details   100 mg 200 mL/hr over 30 Minutes Intravenous Daily 08/06/20 1503 08/06/20 1506   08/06/20 1700  remdesivir 200 mg in sodium chloride 0.9% 250 mL IVPB       "Followed by" Linked Group Details   200 mg 580 mL/hr over 30 Minutes Intravenous Once 08/06/20 1449 08/07/20 0345   08/06/20 1515  remdesivir 200 mg in sodium chloride 0.9% 250 mL IVPB  Status:  Discontinued       "Followed by" Linked Group Details   200 mg 580 mL/hr over 30 Minutes Intravenous Once 08/06/20 1503 08/06/20 1506          Objective:   Vitals:   08/15/20 2300 08/16/20 0332 08/16/20 0714 08/16/20 1144  BP: 108/65 (!) 148/77 120/66 117/64  Pulse: 70 76 70 71  Resp: 14 18 16 17   Temp: 97.6 F (36.4 C) 98 F (36.7 C) 97.9 F (36.6 C) 97.6 F (36.4 C)  TempSrc: Oral Oral Oral Oral  SpO2: 96% 98% 96% 97%  Weight:      Height:        SpO2: 97 % O2 Flow Rate (L/min): 3 L/min  Wt Readings from Last 3 Encounters:  08/06/20 95.3 kg  07/27/20 81.6 kg  10/24/18 103.1 kg     Intake/Output Summary (Last 24 hours) at 08/16/2020 1343 Last data filed at 08/16/2020 1015 Gross per 24 hour  Intake 575 ml  Output 200 ml  Net 375 ml      Physical Exam  Awake alert oriented, x2, in no apparent distress, impaired cognition and insight  Other entry bilaterally, no wheezing  Regular rate and rhythm  Extremities with no edema      Pressure Injury 07/10/20 Buttocks Left Stage 2 -  Partial thickness loss of dermis presenting as a shallow open injury with a red, pink wound bed without slough. (Active)  07/10/20 2330  Location: Buttocks  Location Orientation: Left  Staging: Stage 2 -  Partial thickness loss of dermis presenting as a shallow open injury with a red, pink wound bed without slough.  Wound Description (Comments):  Present on Admission: Yes     Pressure Injury 07/11/20 Buttocks Left Stage 2 -  Partial thickness loss of dermis presenting as a shallow open injury with a red, pink wound bed without slough. (Active)  07/11/20 2330  Location: Buttocks  Location Orientation: Left  Staging: Stage 2 -  Partial thickness loss of dermis presenting as a shallow open injury with a red, pink wound bed without slough.  Wound Description (Comments):   Present on Admission: Yes     Data Review:   Recent Labs  Lab 08/10/20 0459 08/11/20 0059 08/15/20 0409  WBC 3.4* 4.4 4.7  HGB 9.7* 10.4* 10.0*  HCT 33.3* 36.1 34.4*  PLT 246 270 284  MCV 78.0* 79.7* 80.0  MCH 22.7* 23.0* 23.3*  MCHC 29.1* 28.8* 29.1*  RDW Not Measured 30.1* 29.1*  LYMPHSABS 1.9 2.0  --   MONOABS 0.4 0.4  --   EOSABS 0.0 0.0  --   BASOSABS 0.0 0.0  --     Recent Labs  Lab 08/10/20 0459 08/11/20 0059 08/15/20 0409  NA 133* 133* 134*  K 3.1* 3.9 3.5  CL 92* 95* 97*  CO2 30 29 31   GLUCOSE 81 80 97  BUN 10 10 12   CREATININE 0.43* 0.41* 0.50  CALCIUM 8.7* 8.8* 8.7*  AST 19 32  --   ALT 20 40  --   ALKPHOS 39 48  --   BILITOT 0.5 0.5  --   ALBUMIN 3.0* 3.2*  --   MG 1.8 1.9  --   CRP 1.0* 1.1*  --   BNP 43.8 36.8  --     Recent Labs  Lab 08/10/20 0459 08/11/20 0059  CRP 1.0* 1.1*  BNP 43.8 36.8     ------------------------------------------------------------------------------------------------------------------ No results for input(s): CHOL, HDL, LDLCALC, TRIG, CHOLHDL, LDLDIRECT in the last 72 hours.  Lab Results  Component Value Date   HGBA1C 4.7 (L) 08/07/2020   ------------------------------------------------------------------------------------------------------------------ No results for input(s): TSH, T4TOTAL, T3FREE, THYROIDAB in the last 72 hours.  Invalid input(s): FREET3 ------------------------------------------------------------------------------------------------------------------ No results for input(s): VITAMINB12, FOLATE, FERRITIN, TIBC, IRON, RETICCTPCT in the last 72 hours.  Coagulation profile No results for input(s): INR, PROTIME in the last 168 hours.  No results for input(s): DDIMER in the last 72 hours.  Cardiac Enzymes No results for input(s): CKMB, TROPONINI, MYOGLOBIN in the last 168 hours.  Invalid input(s): CK ------------------------------------------------------------------------------------------------------------------    Component Value Date/Time   BNP 36.8 08/11/2020 0059    Micro Results Recent Results (from the past 240 hour(s))  MRSA PCR Screening     Status: None   Collection Time: 08/07/20  6:23 AM   Specimen: Nasal Mucosa; Nasopharyngeal  Result Value Ref Range Status   MRSA by PCR NEGATIVE NEGATIVE Final    Comment:        The GeneXpert MRSA Assay (FDA approved for NASAL specimens only), is one component of a comprehensive MRSA colonization surveillance program. It is not intended to diagnose MRSA infection nor to guide or monitor treatment for MRSA infections. Performed at [facility], [place] [address] [place], [place] [address]     Radiology Reports DG Chest 2 View  Result Date: 07/26/2020 CLINICAL DATA:  Left pleural effusion EXAM: CHEST - 2 VIEW COMPARISON:  07/24/2020 FINDINGS: Small left pleural effusion  is increased. Left basilar atelectasis is present. No pneumothorax. Background changes of emphysema. Stable cardiomediastinal contours. IMPRESSION: Small left pleural effusion increased since 07/24/2020. Mild left basilar atelectasis. Electronically Signed  By: Macy Mis M.D.   On: 07/26/2020 08:07   CT CHEST WO CONTRAST  Result Date: 07/23/2020 CLINICAL DATA:  Abnormal radiograph, pleural effusion EXAM: CT CHEST WITHOUT CONTRAST TECHNIQUE: Multidetector CT imaging of the chest was performed following the standard protocol without IV contrast. COMPARISON:  Radiograph 07/21/2020, CT 07/10/2020 FINDINGS: Cardiovascular: Cardiac size within normal limits. Small volume pericardial fluid is similar to prior. Dense calcifications along the mitral annulus. Additional calcifications of the aortic leaflets. Hypoattenuation of the cardiac blood pool compatible with anemia. Extensive three-vessel native coronary artery atherosclerosis. Atherosclerotic plaque within the normal caliber aorta. No hyperdense mural thickening or plaque displacement nor periaortic stranding or hemorrhage. Shared origin of the brachiocephalic and left common carotid arteries. Additional calcifications seen throughout the proximal great vessels. Central pulmonary arteries are normal caliber. Luminal evaluation precluded in the absence of contrast media. No visible worrisome venous abnormalities. Mediastinum/Nodes: Persistent slight leftward mediastinal shift, similar to comparison. No mediastinal fluid, gas or hemorrhage. Redemonstration of multiple asymmetrically prominent though non pathologically enlarged left axillary and mediastinal lymph nodes. No right axillary adenopathy. Hilar nodal is severely limited in the absence of contrast media. No acute abnormality of the trachea or esophagus. Thyroid gland is diminutive, correlate for prior thyroidectomy or ablation. Lungs/Pleura: Background of moderate centrilobular emphysematous changes  throughout both lungs. There is a moderate residual left pleural effusion with adjacent areas of passive atelectasis in the left lung base. Some additional thickening is noted along the left major fissure which may reflect some persistent loculated fluid. Adjacent atelectatic changes are present along the left major fissure as well. Predominantly involving the lingula and posterior segment left upper lobe. Underlying consolidation would be difficult to exclude. Some there is a stable sub solid opacity in the posterosuperior segment right lower lobe (4/74 measuring up to 8 mm. Possibly post infectious or inflammatory. Stable scarring within the anterior basal segment right lower lobe and major fissure. Right lung is otherwise clear. Upper Abdomen: Multiple calcified gallstones within the largely decompressed gallbladder. Scattered colonic diverticula without focal inflammation to suggest diverticulitis. Extensive upper abdominal atherosclerotic calcification. Stable likely parapelvic cyst in the upper pole left kidney. Musculoskeletal: Mild body wall edema, left slightly more pronounced than right. No acute abnormality or worrisome osseous lesions. Levocurvature of the upper thoracic spine with some mild dextrocurvature of the mid to lower levels. Multilevel discogenic and facet degenerative changes are present. Additional degenerative changes of the right shoulder and sternoclavicular joints similar to prior. IMPRESSION: 1. Moderate residual left pleural effusion with adjacent areas of passive atelectasis in the left lung base. Some additional thickening along the left major fissure may reflect some persistent loculated fluid with some further atelectatic change in the lingula as well. Underlying consolidation within the atelectatic lung would cannot be fully excluded. 2. Stable appearance of an 8 mm ground-glass opacity in the posterosuperior segment right lower lobe. Possibly post infectious or inflammatory.  Recommend attention on follow-up imaging. Initial follow-up with CT at 6-12 months is recommended to confirm persistence. If persistent, repeat CT is recommended every 2 years until 5 years of stability has been established. This recommendation follows the consensus statement: Guidelines for Management of Incidental Pulmonary Nodules Detected on CT Images: From the Fleischner Society 2017; Radiology 2017; 284:228-243. 3. Redemonstration of multiple asymmetrically prominent though non pathologically enlarged left axillary and mediastinal lymph nodes, nonspecific though possibly reactive or edematous. 4. Cholelithiasis without evidence of acute cholecystitis. 5. Colonic diverticulosis without evidence of diverticulitis. 6. Aortic Atherosclerosis (ICD10-I70.0) 7.  Three-vessel coronary artery calcifications. 8. Emphysema (ICD10-J43.9). Electronically Signed   By: Lovena Le M.D.   On: 07/23/2020 22:26   CT ANGIO CHEST PE W OR WO CONTRAST  Result Date: 08/07/2020 CLINICAL DATA:  PE suspected, established right-sided PE, increased shortness of breath, assess embolus burden EXAM: CT ANGIOGRAPHY CHEST WITH CONTRAST TECHNIQUE: Multidetector CT imaging of the chest was performed using the standard protocol during bolus administration of intravenous contrast. Multiplanar CT image reconstructions and MIPs were obtained to evaluate the vascular anatomy. CONTRAST:  66mL OMNIPAQUE IOHEXOL 350 MG/ML SOLN COMPARISON:  None. FINDINGS: Cardiovascular: Examination for pulmonary embolism is somewhat limited by breath motion artifact. Within this limitation, there are occasional probable subsegmental emboli, for example in the dependent right lower lobe (series 6, image 76) and in the dependent left lower lobe (series 6, image 63). There is no segmental or more central emboli identified. Aortic atherosclerosis. Normal heart size. Three-vessel coronary artery calcifications. Enlargement of the main pulmonary artery measuring up to  3.6 cm in caliber. Small pericardial effusion. Mediastinum/Nodes: No enlarged mediastinal, hilar, or axillary lymph nodes. Thyroid gland, trachea, and esophagus demonstrate no significant findings. Lungs/Pleura: Moderate centrilobular emphysema. Moderate left pleural effusion and associated atelectasis or consolidation, similar to prior examination. Upper Abdomen: No acute abnormality. Musculoskeletal: No chest wall abnormality. No acute or significant osseous findings. Review of the MIP images confirms the above findings. IMPRESSION: 1. Examination for pulmonary embolism is somewhat limited by breath motion artifact. Within this limitation, there are occasional probable subsegmental emboli present bilaterally. No segmental or more central embolus identified. 2. Moderate left pleural effusion and associated atelectasis or consolidation, similar to prior examination. 3. Emphysema (ICD10-J43.9). 4. Coronary artery disease. Aortic Atherosclerosis (ICD10-I70.0) 5. Enlargement of the main pulmonary artery measuring up to 3.6 cm in caliber, as can be seen in pulmonary hypertension. Electronically Signed   By: Eddie Candle M.D.   On: 08/07/2020 15:33   DG Chest Port 1 View  Result Date: 08/06/2020 CLINICAL DATA:  COVID-19 positive with shortness of breath EXAM: PORTABLE CHEST 1 VIEW COMPARISON:  July 26, 2020 FINDINGS: There is left lower lobe airspace opacity with small left pleural effusion. The lungs elsewhere are clear. The heart is upper normal in size with pulmonary vascularity normal. No adenopathy. There is aortic atherosclerosis. No bone lesions. IMPRESSION: Small left pleural effusion. Airspace opacity in the left lower lobe concerning for pneumonia with likely associated atelectasis. Lungs elsewhere clear. Stable cardiac silhouette. Aortic Atherosclerosis (ICD10-I70.0). Electronically Signed   By: Lowella Grip III M.D.   On: 08/06/2020 13:31   DG CHEST PORT 1 VIEW  Result Date:  07/24/2020 CLINICAL DATA:  Status post left thoracentesis EXAM: PORTABLE CHEST 1 VIEW COMPARISON:  07/21/2020 FINDINGS: Trace left pleural effusion, improved status post thoracentesis. No pneumothorax is seen. Right lung is essentially clear. The heart is normal in size.  Thoracic aortic atherosclerosis. IMPRESSION: Trace left pleural effusion, improved status post thoracentesis. No pneumothorax is seen. Electronically Signed   By: Julian Hy M.D.   On: 07/24/2020 13:01   DG Chest Port 1 View  Result Date: 07/21/2020 CLINICAL DATA:  Shortness of breath EXAM: PORTABLE CHEST 1 VIEW COMPARISON:  July 15, 2020 FINDINGS: There is consolidation throughout the left mid and lower lung regions with questionable superimposed left pleural effusion. The right lung is clear. Heart is upper normal in size with pulmonary vascularity normal. No adenopathy. There is aortic atherosclerosis. No bone lesions. IMPRESSION: Consolidation throughout much of the left mid and  lower lung regions with suspected small pleural effusions superimposed on the left. Right lung clear. Heart upper normal in size. No adenopathy. Aortic Atherosclerosis (ICD10-I70.0). Electronically Signed   By: Lowella Grip III M.D.   On: 07/21/2020 14:53    Phillips Climes M.D on 08/16/2020 at 1:43 PM  To page go to [email or web address]

## 2020-08-17 DIAGNOSIS — R278 Other lack of coordination: Secondary | ICD-10-CM | POA: Diagnosis present

## 2020-08-17 DIAGNOSIS — E43 Unspecified severe protein-calorie malnutrition: Secondary | ICD-10-CM | POA: Diagnosis present

## 2020-08-17 DIAGNOSIS — J439 Emphysema, unspecified: Secondary | ICD-10-CM | POA: Diagnosis not present

## 2020-08-17 DIAGNOSIS — J9811 Atelectasis: Secondary | ICD-10-CM | POA: Diagnosis not present

## 2020-08-17 DIAGNOSIS — M6281 Muscle weakness (generalized): Secondary | ICD-10-CM | POA: Diagnosis not present

## 2020-08-17 DIAGNOSIS — R0602 Shortness of breath: Secondary | ICD-10-CM | POA: Diagnosis not present

## 2020-08-17 DIAGNOSIS — A4189 Other specified sepsis: Secondary | ICD-10-CM | POA: Diagnosis present

## 2020-08-17 DIAGNOSIS — J90 Pleural effusion, not elsewhere classified: Secondary | ICD-10-CM | POA: Diagnosis not present

## 2020-08-17 DIAGNOSIS — I959 Hypotension, unspecified: Secondary | ICD-10-CM | POA: Diagnosis not present

## 2020-08-17 DIAGNOSIS — R6520 Severe sepsis without septic shock: Secondary | ICD-10-CM | POA: Diagnosis present

## 2020-08-17 DIAGNOSIS — J440 Chronic obstructive pulmonary disease with acute lower respiratory infection: Secondary | ICD-10-CM | POA: Diagnosis present

## 2020-08-17 DIAGNOSIS — D649 Anemia, unspecified: Secondary | ICD-10-CM | POA: Diagnosis present

## 2020-08-17 DIAGNOSIS — G4733 Obstructive sleep apnea (adult) (pediatric): Secondary | ICD-10-CM | POA: Diagnosis present

## 2020-08-17 DIAGNOSIS — E871 Hypo-osmolality and hyponatremia: Secondary | ICD-10-CM | POA: Diagnosis not present

## 2020-08-17 DIAGNOSIS — I10 Essential (primary) hypertension: Secondary | ICD-10-CM | POA: Diagnosis present

## 2020-08-17 DIAGNOSIS — J159 Unspecified bacterial pneumonia: Secondary | ICD-10-CM | POA: Diagnosis present

## 2020-08-17 DIAGNOSIS — J9819 Other pulmonary collapse: Secondary | ICD-10-CM | POA: Diagnosis not present

## 2020-08-17 DIAGNOSIS — K219 Gastro-esophageal reflux disease without esophagitis: Secondary | ICD-10-CM | POA: Diagnosis not present

## 2020-08-17 DIAGNOSIS — Z66 Do not resuscitate: Secondary | ICD-10-CM | POA: Diagnosis present

## 2020-08-17 DIAGNOSIS — J9601 Acute respiratory failure with hypoxia: Secondary | ICD-10-CM | POA: Diagnosis not present

## 2020-08-17 DIAGNOSIS — J449 Chronic obstructive pulmonary disease, unspecified: Secondary | ICD-10-CM | POA: Diagnosis not present

## 2020-08-17 DIAGNOSIS — R531 Weakness: Secondary | ICD-10-CM | POA: Diagnosis not present

## 2020-08-17 DIAGNOSIS — M2550 Pain in unspecified joint: Secondary | ICD-10-CM | POA: Diagnosis not present

## 2020-08-17 DIAGNOSIS — U071 COVID-19: Secondary | ICD-10-CM | POA: Diagnosis present

## 2020-08-17 DIAGNOSIS — J398 Other specified diseases of upper respiratory tract: Secondary | ICD-10-CM | POA: Diagnosis not present

## 2020-08-17 DIAGNOSIS — Z7401 Bed confinement status: Secondary | ICD-10-CM | POA: Diagnosis not present

## 2020-08-17 DIAGNOSIS — Z86711 Personal history of pulmonary embolism: Secondary | ICD-10-CM | POA: Diagnosis not present

## 2020-08-17 DIAGNOSIS — F17210 Nicotine dependence, cigarettes, uncomplicated: Secondary | ICD-10-CM | POA: Diagnosis present

## 2020-08-17 DIAGNOSIS — J9621 Acute and chronic respiratory failure with hypoxia: Secondary | ICD-10-CM | POA: Diagnosis present

## 2020-08-17 DIAGNOSIS — R0603 Acute respiratory distress: Secondary | ICD-10-CM | POA: Diagnosis not present

## 2020-08-17 DIAGNOSIS — J1282 Pneumonia due to coronavirus disease 2019: Secondary | ICD-10-CM | POA: Diagnosis not present

## 2020-08-17 DIAGNOSIS — F4322 Adjustment disorder with anxiety: Secondary | ICD-10-CM | POA: Diagnosis present

## 2020-08-17 DIAGNOSIS — E039 Hypothyroidism, unspecified: Secondary | ICD-10-CM | POA: Diagnosis not present

## 2020-08-17 DIAGNOSIS — R1310 Dysphagia, unspecified: Secondary | ICD-10-CM | POA: Diagnosis present

## 2020-08-17 DIAGNOSIS — R918 Other nonspecific abnormal finding of lung field: Secondary | ICD-10-CM | POA: Diagnosis not present

## 2020-08-17 DIAGNOSIS — E119 Type 2 diabetes mellitus without complications: Secondary | ICD-10-CM | POA: Diagnosis present

## 2020-08-17 DIAGNOSIS — E118 Type 2 diabetes mellitus with unspecified complications: Secondary | ICD-10-CM | POA: Diagnosis not present

## 2020-08-17 LAB — GLUCOSE, CAPILLARY
Glucose-Capillary: 81 mg/dL (ref 70–99)
Glucose-Capillary: 91 mg/dL (ref 70–99)
Glucose-Capillary: 94 mg/dL (ref 70–99)
Glucose-Capillary: 123 mg/dL — ABNORMAL HIGH (ref 70–99)

## 2020-08-17 MED ORDER — APIXABAN 5 MG PO TABS: 5.0000 mg | ORAL_TABLET | Freq: Two times a day (BID) | ORAL | Status: AC

## 2020-08-17 MED ORDER — PANTOPRAZOLE SODIUM 40 MG PO TBEC: 40.0000 mg | DELAYED_RELEASE_TABLET | Freq: Every day | ORAL | Status: DC

## 2020-08-17 MED ORDER — ALPRAZOLAM 1 MG PO TABS: 1.0000 mg | ORAL_TABLET | Freq: Three times a day (TID) | ORAL | 0 refills | Status: AC | PRN

## 2020-08-17 MED ORDER — INSULIN ASPART 100 UNIT/ML ~~LOC~~ SOLN: 0.0000 [IU] | Freq: Three times a day (TID) | SUBCUTANEOUS | 11 refills | Status: AC

## 2020-08-17 NOTE — Progress Notes (Signed)
Report called to [facility] at [place]. All questions answered.

## 2020-08-17 NOTE — TOC Progression Note (Addendum)
Transition of Care [facility]) - Progression Note    Patient Details  Name: Jaime Kelley MRN: [id number] Date of Birth: 1951-03-17  Transition of Care [facility]) CM/SW [place], [place] Phone Number: 08/17/2020, 1:06 PM  Clinical Narrative:    CSW spoke with pt's daughter. Facility will fax admission paperwork to daughter.  Pt can be admitted to SNF today without paperwork being signed per Jonelle Sidle, Director of Admission.  CSW attempted to contact APS Case Worker  Jerilynn Mages, [phone].  Left voice message for APS.  CSW updated physician concerning disposition planning for pt.  CSW will continue to assist for disposition planning.   Expected Discharge Plan: Skilled Nursing Facility Barriers to Discharge: Family Issues, Unsafe home situation, SNF Pending bed offer  Expected Discharge Plan and Services Expected Discharge Plan: [place] In-house Referral: Clinical Social Work   Post Acute Care Choice: [place] Living arrangements for the past 2 months: [place], [place]                                       Social Determinants of Health (SDOH) Interventions    Readmission Risk Interventions Readmission Risk Prevention Plan 08/10/2020 07/27/2020  Transportation Screening Complete Complete  PCP or Specialist Appt within 3-5 Days - Complete  HRI or [place] - Complete  Social Work Consult for [place] Planning/Counseling - Complete  Palliative Care Screening - Not Applicable  Medication Review Press photographer) Referral to Pharmacy Complete  PCP or Specialist appointment within 3-5 days of discharge Complete -  [place] or Home Care Consult Complete -  SW Recovery Care/Counseling Consult Complete -  Palliative Care Screening Not Applicable -  [place] Complete -  Some recent data might be hidden

## 2020-08-17 NOTE — Consult Note (Signed)
   [facility] CM Inpatient Consult   08/17/2020  KENDAHL BUMGARDNER 04-09-1951 [id number]  Follow up:  Patient is transtioning to a non-[facility] affiliated skilled nursing facility, [facility].  Plan:  Will sign off at transition.  [name], RN BSN [facility] Liaison  [phone] business mobile phone Toll free office [phone]  Fax number: [phone] [email or web address]

## 2020-08-17 NOTE — Discharge Summary (Signed)
Jaime Kelley, is a 69 y.o. female  DOB 09-08-1951  MRN [id number].  Admission date:  08/06/2020  Admitting Physician  Mckinley Jewel, MD  Discharge Date:  08/17/2020   Primary MD  Laurey Morale, MD  Recommendations for primary care physician for things to follow:  -Please check CBC, CMP in 3 days -Patient is on oxygen 4 to 5 L nasal cannula at baseline, please continue encouragement to use incentive spirometer.   Admission Diagnosis  Acute hypoxemic respiratory failure due to COVID-19 ([place]) [U07.1, J96.01] Pneumonia due to COVID-19 virus [U07.1, J12.82]   Discharge Diagnosis  Acute hypoxemic respiratory failure due to COVID-19 ([place]) [U07.1, J96.01] Pneumonia due to COVID-19 virus [U07.1, J12.82]    Principal Problem:   Pneumonia due to COVID-19 virus Active Problems:   Hypothyroidism   COPD (chronic obstructive pulmonary disease) (HCC)   GERD   PULMONARY EMBOLISM, HX OF   Hypertension   Type 2 diabetes mellitus ([place])   Hyponatremia   Obesity   Adjustment disorder with anxious mood      Past Medical History:  Diagnosis Date   Anxiety    COPD (chronic obstructive pulmonary disease) (HCC)    Depression    Diabetes mellitus without complication (HCC)    Discoid lupus    sees Dr. Allyson Sabal   Dyspnea    GERD (gastroesophageal reflux disease)    History of colonic polyps    Hx pulmonary embolism    Hypertension    Hypothyroidism    Rheumatoid arthritis(714.0)    sees Dr. Dossie Der     Past Surgical History:  Procedure Laterality Date   ABDOMINAL HYSTERECTOMY     arthroscopy right knee     BREAST EXCISIONAL BIOPSY Right 2011   carpal tunnel release left wrist     CHEST TUBE INSERTION Left 07/12/2020   Procedure: INSERTION PLEURAL DRAINAGE CATHETER;  Surgeon: Garner Nash, DO;  Location: [facility];  Service: Pulmonary;  Laterality: Left;   fibrous dysplasia  removed from left fibula     TONSILLECTOMY     VIDEO BRONCHOSCOPY N/A 07/12/2020   Procedure: VIDEO BRONCHOSCOPY WITHOUT FLUORO;  Surgeon: Garner Nash, DO;  Location: [facility];  Service: Pulmonary;  Laterality: N/A;       History of present illness and  Hospital Course:     Kindly see H&P for history of present illness and admission details, please review complete Labs, Consult reports and Test reports for all details in brief  HPI  from the history and physical done on the day of admission 08/06/2020  HPI: Jaime Kelley is a 69 y.o. female with medical history significant of hypertension, type 2 diabetes mellitus, morbid obesity, depression/anxiety, chronic hypoxemic respiratory failure secondary to underlying COPD-on 5 L of oxygen at baseline, RA, hypothyroidism, history of PE/DVT-on Coumadin, GERD presents to emergency department for evaluation of hypoxia.  This morning SNF staff found patient have oxygen saturation of 87% on 3 to 4 L of oxygen.  Patient also tested positive for COVID-19  on 10/14.  Patient was given nebulizer treatment and EMS was called and brought patient to the emergency department for further evaluation and management.  Patient is not very good historian.  She is alert but appears little confused.  She reports some shortness of breath which she thinks is because of her anxiety.  She does not want to be admitted.  Denies any other symptoms such as chest pain, cough, wheezing, headache, fever, chills, vomiting, abdominal pain, urinary or bowel changes.  Patient recently admitted on 9/18 with complaints of substernal chest pressure and was found to have large left-sided pleural effusion with complete collapse of left lung requiring nonrebreather initially.  Patient underwent left pleural space drainage and chest tube placement which was removed on 9/23.  Bronchoscopy was performed on 9/20 with removal of solitary mucous plug.  Repeat chest x-ray showed  recurrent opacification.  Pulmonology performed a repeat thoracentesis at bedside on 07/24/2020.  Chest x-ray on 10/5 demonstrated a small amount of reaccumulation of pleural effusion however oxygen requirements improved to 3 L therefore she discharged to SNF in a stable condition and received [place] Covid vaccine on 10/5.  She denies current use of cigarettes, alcohol, illicit drug use.  ED Course: Upon arrival to ED: Patient's vital signs stable, afebrile requiring 3 to 4 L of oxygen via nasal cannula.  COVID-19 positive, lactic acid: WNL, D-dimer: 5.89, BMP shows sodium of 128, CRP: 5.5, blood culture, CBC: Pending.  Chest x-ray concerning for left lower lobe pneumonia.  Patient was started on Solu-Medrol and remdesivir in ED and [facility] hospitalist consulted for admission due to COVID-19 pneumonia.   Hospital Course    acute on chronic hypoxic respiratory failure  due to mild bronchitis + PE  - doubt she has true COVID-19 pneumonia, she did finish her steroid taper, and remdesivir treatment,. -She is on warfarin, but her INR is subtherapeutic, so she was transitioned to full dose Lovenox initially, and she was started on Eliquis during hospital stay, she will be discharged on Eliquis as it is more reliable . -She is at her baseline 3 to 5 L nasal cannula .   Underlying COPD with 5 L nasal cannula oxygen at home.   -Supportive care.  Treated with IV steroids, now transitioned to p.o. steroids, she is with no wheezing, her steroids has been stopped as of 10/25.  Social/ethics -Patient reports she wants to go home, did not want placement, as well she threatened to leave AMA, though she is awake alert x3 intermittently, she is with some episodes of confusion and delusions intermittently, psychiatry were consulted during previous admission, and this admission as well, for which she has deemed not to have capacity to make her own medical decisions, and placement decisions as  well. -Social worker has been consulted, and will have SNF placement -. Stable TSH, B12 and RPR.  Acute PE with history of DVT. -  Was on Coumadin with subtherapeutic INR, has a new PE, placed on Lovenox >> Eliquis started on 08/09/2020.  Anxiety and depression  -on Zoloft and Xanax.  Continue.  Hypothyroidism.  On Synthroid.  Essential hypertension.  On Norvasc and ARB.  Morbid obesity.  BMI 41.  Follow with PCP.   Hypokalemia -Repleted  DM type II.    Resume home Metformin and will keep on sliding scale during her SNF stay    Discharge Condition:  stable    Discharge Instructions  and  Discharge Medications     Discharge Instructions  Discharge instructions   Complete by: As directed    Follow with Primary MD Laurey Morale, MD or SNF physician  Get CBC, CMP,  checked  by Primary MD next visit.    Activity: As tolerated with Full fall precautions use walker/cane & assistance as needed   Disposition Home    Diet: Heart Healthy /carb modified , with feeding assistance and aspiration precautions.  For Heart failure patients - Check your Weight same time everyday, if you gain over 2 pounds, or you develop in leg swelling, experience more shortness of breath or chest pain, call your Primary MD immediately. Follow Cardiac Low Salt Diet and 1.5 lit/day fluid restriction.   On your next visit with your primary care physician please Get Medicines reviewed and adjusted.   Please request your Prim.MD to go over all Hospital Tests and Procedure/Radiological results at the follow up, please get all Hospital records sent to your Prim MD by signing hospital release before you go home.   If you experience worsening of your admission symptoms, develop shortness of breath, life threatening emergency, suicidal or homicidal thoughts you must seek medical attention immediately by calling 911 or calling your MD immediately  if symptoms less severe.  You Must read  complete instructions/literature along with all the possible adverse reactions/side effects for all the Medicines you take and that have been prescribed to you. Take any new Medicines after you have completely understood and accpet all the possible adverse reactions/side effects.   Do not drive, operating heavy machinery, perform activities at heights, swimming or participation in water activities or provide baby sitting services if your were admitted for syncope or siezures until you have seen by Primary MD or a Neurologist and advised to do so again.  Do not drive when taking Pain medications.    Do not take more than prescribed Pain, Sleep and Anxiety Medications  Special Instructions: If you have smoked or chewed Tobacco  in the last 2 yrs please stop smoking, stop any regular Alcohol  and or any Recreational drug use.  Wear Seat belts while driving.   Please note  You were cared for by a hospitalist during your hospital stay. If you have any questions about your discharge medications or the care you received while you were in the hospital after you are discharged, you can call the unit and asked to speak with the hospitalist on call if the hospitalist that took care of you is not available. Once you are discharged, your primary care physician will handle any further medical issues. Please note that NO REFILLS for any discharge medications will be authorized once you are discharged, as it is imperative that you return to your primary care physician (or establish a relationship with a primary care physician if you do not have one) for your aftercare needs so that they can reassess your need for medications and monitor your lab values.   Increase activity slowly   Complete by: As directed      Allergies as of 08/17/2020      Reactions   Effexor [venlafaxine Hydrochloride] Rash      Medication List    STOP taking these medications   HYDROcodone-acetaminophen 10-325 MG tablet Commonly  known as: NORCO   warfarin 1 MG tablet Commonly known as: COUMADIN   warfarin 7.5 MG tablet Commonly known as: COUMADIN     TAKE these medications   acetaminophen 325 MG tablet Commonly known as: TYLENOL Take 650 mg by mouth every  4 (four) hours as needed for mild pain, fever or headache.   albuterol 108 (90 Base) MCG/ACT inhaler Commonly known as: ProAir HFA INHALE 2 PUFFS INTO THE LUNGS EVERY 4 (FOUR) HOURS AS NEEDED FOR WHEEZING. What changed:   how much to take  how to take this  when to take this  additional instructions   ALPRAZolam 1 MG tablet Commonly known as: XANAX Take 1 tablet (1 mg total) by mouth 3 (three) times daily as needed for anxiety. What changed: when to take this   amLODipine 10 MG tablet Commonly known as: NORVASC TAKE 1 TABLET BY MOUTH EVERY DAY   apixaban 5 MG Tabs tablet Commonly known as: ELIQUIS Take 1 tablet (5 mg total) by mouth 2 (two) times daily.   arformoterol 15 MCG/2ML Nebu Commonly known as: BROVANA Take 2 mLs (15 mcg total) by nebulization 2 (two) times daily.   ascorbic acid 500 MG tablet Commonly known as: VITAMIN C Take 500 mg by mouth 2 (two) times daily.   budesonide 0.25 MG/2ML nebulizer solution Commonly known as: PULMICORT Take 2 mLs (0.25 mg total) by nebulization 2 (two) times daily.   feeding supplement (GLUCERNA SHAKE) Liqd Take 237 mLs by mouth 3 (three) times daily between meals.   guaiFENesin 600 MG 12 hr tablet Commonly known as: MUCINEX Take 2 tablets (1,200 mg total) by mouth 2 (two) times daily.   insulin aspart 100 UNIT/ML injection Commonly known as: novoLOG Inject 0-15 Units into the skin 3 (three) times daily with meals.   ipratropium-albuterol 0.5-2.5 (3) MG/3ML Soln Commonly known as: DUONEB TAKE 3 MLS BY NEBULIZATION EVERY 4 (FOUR) HOURS AS NEEDED. What changed: reasons to take this   losartan 100 MG tablet Commonly known as: COZAAR TAKE 1 TABLET BY MOUTH EVERY DAY   metFORMIN 500  MG tablet Commonly known as: GLUCOPHAGE Take 1 tablet (500 mg total) by mouth 2 (two) times daily with a meal.   multivitamin with minerals Tabs tablet Take 1 tablet by mouth daily.   Muscle Rub 10-15 % Crea Apply 1 application topically as needed for muscle pain.   OXYGEN Inhale 5 L into the lungs. continuous   pantoprazole 40 MG tablet Commonly known as: PROTONIX Take 1 tablet (40 mg total) by mouth daily. Start taking on: August 18, 2020   revefenacin 175 MCG/3ML nebulizer solution Commonly known as: YUPELRI Take 3 mLs (175 mcg total) by nebulization daily.   sertraline 100 MG tablet Commonly known as: ZOLOFT TAKE 1 TABLET BY MOUTH EVERY DAY   Spiriva Respimat 2.5 MCG/ACT Aers Generic drug: Tiotropium Bromide Monohydrate Inhale 2 puffs into the lungs daily.   Synthroid 137 MCG tablet Generic drug: levothyroxine TAKE 1 TABLET BY MOUTH EVERY DAY BEFORE BREAKFAST What changed: See the new instructions.   Vitamin D (Ergocalciferol) 1.25 MG (50000 UNIT) Caps capsule Commonly known as: DRISDOL Take 50,000 Units by mouth daily.         Diet and Activity recommendation: See Discharge Instructions above   Consults obtained - Psychiatry   Major procedures and Radiology Reports - PLEASE review detailed and final reports for all details, in brief -      DG Chest 2 View  Result Date: 07/26/2020 CLINICAL DATA:  Left pleural effusion EXAM: CHEST - 2 VIEW COMPARISON:  07/24/2020 FINDINGS: Small left pleural effusion is increased. Left basilar atelectasis is present. No pneumothorax. Background changes of emphysema. Stable cardiomediastinal contours. IMPRESSION: Small left pleural effusion increased since 07/24/2020. Mild left basilar atelectasis.  Electronically Signed   By: Macy Mis M.D.   On: 07/26/2020 08:07   CT CHEST WO CONTRAST  Result Date: 07/23/2020 CLINICAL DATA:  Abnormal radiograph, pleural effusion EXAM: CT CHEST WITHOUT CONTRAST TECHNIQUE:  Multidetector CT imaging of the chest was performed following the standard protocol without IV contrast. COMPARISON:  Radiograph 07/21/2020, CT 07/10/2020 FINDINGS: Cardiovascular: Cardiac size within normal limits. Small volume pericardial fluid is similar to prior. Dense calcifications along the mitral annulus. Additional calcifications of the aortic leaflets. Hypoattenuation of the cardiac blood pool compatible with anemia. Extensive three-vessel native coronary artery atherosclerosis. Atherosclerotic plaque within the normal caliber aorta. No hyperdense mural thickening or plaque displacement nor periaortic stranding or hemorrhage. Shared origin of the brachiocephalic and left common carotid arteries. Additional calcifications seen throughout the proximal great vessels. Central pulmonary arteries are normal caliber. Luminal evaluation precluded in the absence of contrast media. No visible worrisome venous abnormalities. Mediastinum/Nodes: Persistent slight leftward mediastinal shift, similar to comparison. No mediastinal fluid, gas or hemorrhage. Redemonstration of multiple asymmetrically prominent though non pathologically enlarged left axillary and mediastinal lymph nodes. No right axillary adenopathy. Hilar nodal is severely limited in the absence of contrast media. No acute abnormality of the trachea or esophagus. Thyroid gland is diminutive, correlate for prior thyroidectomy or ablation. Lungs/Pleura: Background of moderate centrilobular emphysematous changes throughout both lungs. There is a moderate residual left pleural effusion with adjacent areas of passive atelectasis in the left lung base. Some additional thickening is noted along the left major fissure which may reflect some persistent loculated fluid. Adjacent atelectatic changes are present along the left major fissure as well. Predominantly involving the lingula and posterior segment left upper lobe. Underlying consolidation would be difficult to  exclude. Some there is a stable sub solid opacity in the posterosuperior segment right lower lobe (4/74 measuring up to 8 mm. Possibly post infectious or inflammatory. Stable scarring within the anterior basal segment right lower lobe and major fissure. Right lung is otherwise clear. Upper Abdomen: Multiple calcified gallstones within the largely decompressed gallbladder. Scattered colonic diverticula without focal inflammation to suggest diverticulitis. Extensive upper abdominal atherosclerotic calcification. Stable likely parapelvic cyst in the upper pole left kidney. Musculoskeletal: Mild body wall edema, left slightly more pronounced than right. No acute abnormality or worrisome osseous lesions. Levocurvature of the upper thoracic spine with some mild dextrocurvature of the mid to lower levels. Multilevel discogenic and facet degenerative changes are present. Additional degenerative changes of the right shoulder and sternoclavicular joints similar to prior. IMPRESSION: 1. Moderate residual left pleural effusion with adjacent areas of passive atelectasis in the left lung base. Some additional thickening along the left major fissure may reflect some persistent loculated fluid with some further atelectatic change in the lingula as well. Underlying consolidation within the atelectatic lung would cannot be fully excluded. 2. Stable appearance of an 8 mm ground-glass opacity in the posterosuperior segment right lower lobe. Possibly post infectious or inflammatory. Recommend attention on follow-up imaging. Initial follow-up with CT at 6-12 months is recommended to confirm persistence. If persistent, repeat CT is recommended every 2 years until 5 years of stability has been established. This recommendation follows the consensus statement: Guidelines for Management of Incidental Pulmonary Nodules Detected on CT Images: From the Fleischner Society 2017; Radiology 2017; 284:228-243. 3. Redemonstration of multiple  asymmetrically prominent though non pathologically enlarged left axillary and mediastinal lymph nodes, nonspecific though possibly reactive or edematous. 4. Cholelithiasis without evidence of acute cholecystitis. 5. Colonic diverticulosis without evidence of diverticulitis.  6. Aortic Atherosclerosis (ICD10-I70.0) 7. Three-vessel coronary artery calcifications. 8. Emphysema (ICD10-J43.9). Electronically Signed   By: Lovena Le M.D.   On: 07/23/2020 22:26   CT ANGIO CHEST PE W OR WO CONTRAST  Result Date: 08/07/2020 CLINICAL DATA:  PE suspected, established right-sided PE, increased shortness of breath, assess embolus burden EXAM: CT ANGIOGRAPHY CHEST WITH CONTRAST TECHNIQUE: Multidetector CT imaging of the chest was performed using the standard protocol during bolus administration of intravenous contrast. Multiplanar CT image reconstructions and MIPs were obtained to evaluate the vascular anatomy. CONTRAST:  70mL OMNIPAQUE IOHEXOL 350 MG/ML SOLN COMPARISON:  None. FINDINGS: Cardiovascular: Examination for pulmonary embolism is somewhat limited by breath motion artifact. Within this limitation, there are occasional probable subsegmental emboli, for example in the dependent right lower lobe (series 6, image 76) and in the dependent left lower lobe (series 6, image 63). There is no segmental or more central emboli identified. Aortic atherosclerosis. Normal heart size. Three-vessel coronary artery calcifications. Enlargement of the main pulmonary artery measuring up to 3.6 cm in caliber. Small pericardial effusion. Mediastinum/Nodes: No enlarged mediastinal, hilar, or axillary lymph nodes. Thyroid gland, trachea, and esophagus demonstrate no significant findings. Lungs/Pleura: Moderate centrilobular emphysema. Moderate left pleural effusion and associated atelectasis or consolidation, similar to prior examination. Upper Abdomen: No acute abnormality. Musculoskeletal: No chest wall abnormality. No acute or  significant osseous findings. Review of the MIP images confirms the above findings. IMPRESSION: 1. Examination for pulmonary embolism is somewhat limited by breath motion artifact. Within this limitation, there are occasional probable subsegmental emboli present bilaterally. No segmental or more central embolus identified. 2. Moderate left pleural effusion and associated atelectasis or consolidation, similar to prior examination. 3. Emphysema (ICD10-J43.9). 4. Coronary artery disease. Aortic Atherosclerosis (ICD10-I70.0) 5. Enlargement of the main pulmonary artery measuring up to 3.6 cm in caliber, as can be seen in pulmonary hypertension. Electronically Signed   By: Eddie Candle M.D.   On: 08/07/2020 15:33   DG Chest Port 1 View  Result Date: 08/06/2020 CLINICAL DATA:  COVID-19 positive with shortness of breath EXAM: PORTABLE CHEST 1 VIEW COMPARISON:  July 26, 2020 FINDINGS: There is left lower lobe airspace opacity with small left pleural effusion. The lungs elsewhere are clear. The heart is upper normal in size with pulmonary vascularity normal. No adenopathy. There is aortic atherosclerosis. No bone lesions. IMPRESSION: Small left pleural effusion. Airspace opacity in the left lower lobe concerning for pneumonia with likely associated atelectasis. Lungs elsewhere clear. Stable cardiac silhouette. Aortic Atherosclerosis (ICD10-I70.0). Electronically Signed   By: Lowella Grip III M.D.   On: 08/06/2020 13:31   DG CHEST PORT 1 VIEW  Result Date: 07/24/2020 CLINICAL DATA:  Status post left thoracentesis EXAM: PORTABLE CHEST 1 VIEW COMPARISON:  07/21/2020 FINDINGS: Trace left pleural effusion, improved status post thoracentesis. No pneumothorax is seen. Right lung is essentially clear. The heart is normal in size.  Thoracic aortic atherosclerosis. IMPRESSION: Trace left pleural effusion, improved status post thoracentesis. No pneumothorax is seen. Electronically Signed   By: Julian Hy M.D.    On: 07/24/2020 13:01   DG Chest Port 1 View  Result Date: 07/21/2020 CLINICAL DATA:  Shortness of breath EXAM: PORTABLE CHEST 1 VIEW COMPARISON:  July 15, 2020 FINDINGS: There is consolidation throughout the left mid and lower lung regions with questionable superimposed left pleural effusion. The right lung is clear. Heart is upper normal in size with pulmonary vascularity normal. No adenopathy. There is aortic atherosclerosis. No bone lesions. IMPRESSION: Consolidation throughout much  of the left mid and lower lung regions with suspected small pleural effusions superimposed on the left. Right lung clear. Heart upper normal in size. No adenopathy. Aortic Atherosclerosis (ICD10-I70.0). Electronically Signed   By: Lowella Grip III M.D.   On: 07/21/2020 14:53    Micro Results     No results found for this or any previous visit (from the past 240 hour(s)).     Today   Subjective:   Lashay Osborne today has no headache,no chest or abdominal pain, no dyspnea or cough .  Objective:   Blood pressure (!) 115/55, pulse 77, temperature 98 F (36.7 C), temperature source Oral, resp. rate 20, height 5' (1.524 m), weight 95.3 kg, SpO2 94 %.   Intake/Output Summary (Last 24 hours) at 08/17/2020 1332 Last data filed at 08/17/2020 0645 Gross per 24 hour  Intake --  Output 250 ml  Net -250 ml    Exam Awake Alert, Oriented x 2, no apparent distress  Symmetrical Chest wall movement, Good air movement bilaterally, CTAB RRR,No Gallops,Rubs or new Murmurs, No Parasternal Heave +ve B.Sounds, Abd Soft, Non tender, No rebound -guarding or rigidity. No Cyanosis, Clubbing or edema, No new Rash or bruise  Data Review   CBC w Diff:  Lab Results  Component Value Date   WBC 4.7 08/15/2020   HGB 10.0 (L) 08/15/2020   HCT 34.4 (L) 08/15/2020   PLT 284 08/15/2020   LYMPHOPCT 46 08/11/2020   MONOPCT 9 08/11/2020   EOSPCT 0 08/11/2020   BASOPCT 0 08/11/2020    CMP:  Lab Results   Component Value Date   NA 134 (L) 08/15/2020   K 3.5 08/15/2020   CL 97 (L) 08/15/2020   CO2 31 08/15/2020   BUN 12 08/15/2020   CREATININE 0.50 08/15/2020   PROT 6.1 (L) 08/11/2020   ALBUMIN 3.2 (L) 08/11/2020   BILITOT 0.5 08/11/2020   ALKPHOS 48 08/11/2020   AST 32 08/11/2020   ALT 40 08/11/2020  .   Total Time in preparing paper work, data evaluation and todays exam - 17 minutes  Phillips Climes M.D on 08/17/2020 at 1:32 PM  [facility]   Office  [phone]

## 2020-08-17 NOTE — TOC Transition Note (Addendum)
Transition of Care [facility]) - CM/SW Discharge Note   Patient Details  Name: Jaime Kelley MRN: [id number] Date of Birth: 09/10/1951  Transition of Care [facility]) CM/SW Contact:  Jaime Kelley Phone Number: 08/17/2020, 2:06 PM   Clinical Narrative:    Patient will Discharge To: [facility] Anticipated DC Date:08/17/20 Family Notified: left message for daughter to contact [place], Jaime Kelley, [phone] Transport BX:[id number]   Per MD patient ready for DC to [name] . RN, patient, patient's family, and facility notified of DC. Assessment, Fl2/Pasrr, and Discharge Summary sent to facility. RN given number for report [phone], Room # 202). DC packet on chart. Ambulance transport requested for patient.   CSW signing off.  Reed Breech [facility] [phone]     Final next level of care: Skilled Nursing Facility Barriers to Discharge: No Barriers Identified   Patient Goals and CMS Choice Patient states their goals for this hospitalization and ongoing recovery are:: Rehab CMS Medicare.gov Compare Post Acute Care list provided to:: Patient Represenative (must comment) (Daughter) Choice offered to / list presented to : Adult Children  Discharge Placement              Patient chooses bed at:  ([facility]) Patient to be transferred to facility by: [place] Name of family member notified: Jaime Kelley Patient and family notified of of transfer: 08/17/20  Discharge Plan and Services In-house Referral: Clinical Social Work   Post Acute Care Choice: [place]                               Social Determinants of Health (SDOH) Interventions     Readmission Risk Interventions Readmission Risk Prevention Plan 08/10/2020 07/27/2020  Transportation Screening Complete Complete  PCP or Specialist Appt within 3-5 Days - Complete  HRI or [place] - Complete   Social Work Consult for [place] Planning/Counseling - Complete  Palliative Care Screening - Not Applicable  Medication Review Press photographer) Referral to Pharmacy Complete  PCP or Specialist appointment within 3-5 days of discharge Complete -  [place] or Home Care Consult Complete -  SW Recovery Care/Counseling Consult Complete -  Palliative Care Screening Not Applicable -  [place] Complete -  Some recent data might be hidden

## 2020-08-18 DIAGNOSIS — E871 Hypo-osmolality and hyponatremia: Secondary | ICD-10-CM | POA: Diagnosis not present

## 2020-08-18 DIAGNOSIS — E039 Hypothyroidism, unspecified: Secondary | ICD-10-CM | POA: Diagnosis not present

## 2020-08-18 DIAGNOSIS — K219 Gastro-esophageal reflux disease without esophagitis: Secondary | ICD-10-CM | POA: Diagnosis not present

## 2020-08-18 DIAGNOSIS — J1282 Pneumonia due to coronavirus disease 2019: Secondary | ICD-10-CM | POA: Diagnosis not present

## 2020-08-20 DIAGNOSIS — E039 Hypothyroidism, unspecified: Secondary | ICD-10-CM | POA: Diagnosis not present

## 2020-08-20 DIAGNOSIS — J449 Chronic obstructive pulmonary disease, unspecified: Secondary | ICD-10-CM | POA: Diagnosis not present

## 2020-08-20 DIAGNOSIS — E118 Type 2 diabetes mellitus with unspecified complications: Secondary | ICD-10-CM | POA: Diagnosis not present

## 2020-08-20 DIAGNOSIS — M6281 Muscle weakness (generalized): Secondary | ICD-10-CM | POA: Diagnosis not present

## 2020-08-23 DIAGNOSIS — R5381 Other malaise: Secondary | ICD-10-CM | POA: Diagnosis not present

## 2020-08-23 DIAGNOSIS — Z743 Need for continuous supervision: Secondary | ICD-10-CM | POA: Diagnosis not present

## 2020-08-23 DIAGNOSIS — E43 Unspecified severe protein-calorie malnutrition: Secondary | ICD-10-CM | POA: Diagnosis present

## 2020-08-23 DIAGNOSIS — J440 Chronic obstructive pulmonary disease with acute lower respiratory infection: Secondary | ICD-10-CM | POA: Diagnosis present

## 2020-08-23 DIAGNOSIS — R6520 Severe sepsis without septic shock: Secondary | ICD-10-CM | POA: Diagnosis present

## 2020-08-23 DIAGNOSIS — U071 COVID-19: Secondary | ICD-10-CM | POA: Diagnosis present

## 2020-08-23 DIAGNOSIS — J9819 Other pulmonary collapse: Secondary | ICD-10-CM | POA: Diagnosis not present

## 2020-08-23 DIAGNOSIS — J1282 Pneumonia due to coronavirus disease 2019: Secondary | ICD-10-CM | POA: Diagnosis present

## 2020-08-23 DIAGNOSIS — K219 Gastro-esophageal reflux disease without esophagitis: Secondary | ICD-10-CM | POA: Diagnosis present

## 2020-08-23 DIAGNOSIS — J449 Chronic obstructive pulmonary disease, unspecified: Secondary | ICD-10-CM | POA: Diagnosis present

## 2020-08-23 DIAGNOSIS — Z86711 Personal history of pulmonary embolism: Secondary | ICD-10-CM | POA: Diagnosis not present

## 2020-08-23 DIAGNOSIS — M25562 Pain in left knee: Secondary | ICD-10-CM | POA: Diagnosis present

## 2020-08-23 DIAGNOSIS — R000 Tachycardia, unspecified: Secondary | ICD-10-CM | POA: Diagnosis not present

## 2020-08-23 DIAGNOSIS — F17210 Nicotine dependence, cigarettes, uncomplicated: Secondary | ICD-10-CM | POA: Diagnosis present

## 2020-08-23 DIAGNOSIS — E119 Type 2 diabetes mellitus without complications: Secondary | ICD-10-CM | POA: Diagnosis present

## 2020-08-23 DIAGNOSIS — J9621 Acute and chronic respiratory failure with hypoxia: Secondary | ICD-10-CM | POA: Diagnosis present

## 2020-08-23 DIAGNOSIS — J9809 Other diseases of bronchus, not elsewhere classified: Secondary | ICD-10-CM | POA: Diagnosis not present

## 2020-08-23 DIAGNOSIS — G4733 Obstructive sleep apnea (adult) (pediatric): Secondary | ICD-10-CM | POA: Diagnosis present

## 2020-08-23 DIAGNOSIS — J398 Other specified diseases of upper respiratory tract: Secondary | ICD-10-CM | POA: Diagnosis not present

## 2020-08-23 DIAGNOSIS — Z794 Long term (current) use of insulin: Secondary | ICD-10-CM | POA: Diagnosis not present

## 2020-08-23 DIAGNOSIS — R1310 Dysphagia, unspecified: Secondary | ICD-10-CM | POA: Diagnosis present

## 2020-08-23 DIAGNOSIS — D649 Anemia, unspecified: Secondary | ICD-10-CM | POA: Diagnosis present

## 2020-08-23 DIAGNOSIS — I10 Essential (primary) hypertension: Secondary | ICD-10-CM | POA: Diagnosis present

## 2020-08-23 DIAGNOSIS — E039 Hypothyroidism, unspecified: Secondary | ICD-10-CM | POA: Diagnosis present

## 2020-08-23 DIAGNOSIS — R279 Unspecified lack of coordination: Secondary | ICD-10-CM | POA: Diagnosis not present

## 2020-08-23 DIAGNOSIS — R0602 Shortness of breath: Secondary | ICD-10-CM | POA: Diagnosis not present

## 2020-08-23 DIAGNOSIS — Z9981 Dependence on supplemental oxygen: Secondary | ICD-10-CM | POA: Diagnosis not present

## 2020-08-23 DIAGNOSIS — J90 Pleural effusion, not elsewhere classified: Secondary | ICD-10-CM | POA: Diagnosis present

## 2020-08-23 DIAGNOSIS — E118 Type 2 diabetes mellitus with unspecified complications: Secondary | ICD-10-CM | POA: Diagnosis present

## 2020-08-23 DIAGNOSIS — R2681 Unsteadiness on feet: Secondary | ICD-10-CM | POA: Diagnosis present

## 2020-08-23 DIAGNOSIS — R2689 Other abnormalities of gait and mobility: Secondary | ICD-10-CM | POA: Diagnosis present

## 2020-08-23 DIAGNOSIS — J159 Unspecified bacterial pneumonia: Secondary | ICD-10-CM | POA: Diagnosis present

## 2020-08-23 DIAGNOSIS — A4189 Other specified sepsis: Secondary | ICD-10-CM | POA: Diagnosis present

## 2020-08-23 DIAGNOSIS — M25561 Pain in right knee: Secondary | ICD-10-CM | POA: Diagnosis present

## 2020-08-23 DIAGNOSIS — J9811 Atelectasis: Secondary | ICD-10-CM | POA: Diagnosis not present

## 2020-08-23 DIAGNOSIS — R918 Other nonspecific abnormal finding of lung field: Secondary | ICD-10-CM | POA: Diagnosis not present

## 2020-08-23 DIAGNOSIS — M6281 Muscle weakness (generalized): Secondary | ICD-10-CM | POA: Diagnosis present

## 2020-08-23 DIAGNOSIS — Z66 Do not resuscitate: Secondary | ICD-10-CM | POA: Diagnosis not present

## 2020-08-23 DIAGNOSIS — J9601 Acute respiratory failure with hypoxia: Secondary | ICD-10-CM | POA: Diagnosis present

## 2020-08-23 DIAGNOSIS — R0603 Acute respiratory distress: Secondary | ICD-10-CM | POA: Diagnosis not present

## 2020-08-23 DIAGNOSIS — T17990A Other foreign object in respiratory tract, part unspecified in causing asphyxiation, initial encounter: Secondary | ICD-10-CM | POA: Diagnosis not present

## 2020-08-31 DIAGNOSIS — M6281 Muscle weakness (generalized): Secondary | ICD-10-CM | POA: Diagnosis present

## 2020-08-31 DIAGNOSIS — F419 Anxiety disorder, unspecified: Secondary | ICD-10-CM | POA: Diagnosis not present

## 2020-08-31 DIAGNOSIS — M1711 Unilateral primary osteoarthritis, right knee: Secondary | ICD-10-CM | POA: Diagnosis not present

## 2020-08-31 DIAGNOSIS — Z743 Need for continuous supervision: Secondary | ICD-10-CM | POA: Diagnosis not present

## 2020-08-31 DIAGNOSIS — M1712 Unilateral primary osteoarthritis, left knee: Secondary | ICD-10-CM | POA: Diagnosis not present

## 2020-08-31 DIAGNOSIS — F32A Depression, unspecified: Secondary | ICD-10-CM | POA: Diagnosis not present

## 2020-08-31 DIAGNOSIS — E46 Unspecified protein-calorie malnutrition: Secondary | ICD-10-CM | POA: Diagnosis not present

## 2020-08-31 DIAGNOSIS — J9601 Acute respiratory failure with hypoxia: Secondary | ICD-10-CM | POA: Diagnosis present

## 2020-08-31 DIAGNOSIS — M25561 Pain in right knee: Secondary | ICD-10-CM | POA: Diagnosis present

## 2020-08-31 DIAGNOSIS — I10 Essential (primary) hypertension: Secondary | ICD-10-CM | POA: Diagnosis not present

## 2020-08-31 DIAGNOSIS — R2681 Unsteadiness on feet: Secondary | ICD-10-CM | POA: Diagnosis present

## 2020-08-31 DIAGNOSIS — E119 Type 2 diabetes mellitus without complications: Secondary | ICD-10-CM | POA: Diagnosis not present

## 2020-08-31 DIAGNOSIS — J449 Chronic obstructive pulmonary disease, unspecified: Secondary | ICD-10-CM | POA: Diagnosis present

## 2020-08-31 DIAGNOSIS — T17990A Other foreign object in respiratory tract, part unspecified in causing asphyxiation, initial encounter: Secondary | ICD-10-CM | POA: Diagnosis not present

## 2020-08-31 DIAGNOSIS — R21 Rash and other nonspecific skin eruption: Secondary | ICD-10-CM | POA: Diagnosis not present

## 2020-08-31 DIAGNOSIS — M25562 Pain in left knee: Secondary | ICD-10-CM | POA: Diagnosis present

## 2020-08-31 DIAGNOSIS — M25551 Pain in right hip: Secondary | ICD-10-CM | POA: Diagnosis not present

## 2020-08-31 DIAGNOSIS — E118 Type 2 diabetes mellitus with unspecified complications: Secondary | ICD-10-CM | POA: Diagnosis present

## 2020-08-31 DIAGNOSIS — R2689 Other abnormalities of gait and mobility: Secondary | ICD-10-CM | POA: Diagnosis present

## 2020-08-31 DIAGNOSIS — K219 Gastro-esophageal reflux disease without esophagitis: Secondary | ICD-10-CM | POA: Diagnosis not present

## 2020-08-31 DIAGNOSIS — R279 Unspecified lack of coordination: Secondary | ICD-10-CM | POA: Diagnosis not present

## 2020-08-31 DIAGNOSIS — F4322 Adjustment disorder with anxiety: Secondary | ICD-10-CM | POA: Diagnosis not present

## 2020-08-31 DIAGNOSIS — E559 Vitamin D deficiency, unspecified: Secondary | ICD-10-CM | POA: Diagnosis not present

## 2020-08-31 DIAGNOSIS — E039 Hypothyroidism, unspecified: Secondary | ICD-10-CM | POA: Diagnosis not present

## 2020-08-31 DIAGNOSIS — U071 COVID-19: Secondary | ICD-10-CM | POA: Diagnosis not present

## 2020-08-31 DIAGNOSIS — J1282 Pneumonia due to coronavirus disease 2019: Secondary | ICD-10-CM | POA: Diagnosis not present

## 2020-08-31 DIAGNOSIS — R5381 Other malaise: Secondary | ICD-10-CM | POA: Diagnosis not present

## 2020-08-31 DIAGNOSIS — D649 Anemia, unspecified: Secondary | ICD-10-CM | POA: Diagnosis not present

## 2020-09-01 DIAGNOSIS — J9601 Acute respiratory failure with hypoxia: Secondary | ICD-10-CM | POA: Diagnosis not present

## 2020-09-01 DIAGNOSIS — D649 Anemia, unspecified: Secondary | ICD-10-CM | POA: Diagnosis not present

## 2020-09-01 DIAGNOSIS — I10 Essential (primary) hypertension: Secondary | ICD-10-CM | POA: Diagnosis not present

## 2020-09-01 DIAGNOSIS — E039 Hypothyroidism, unspecified: Secondary | ICD-10-CM | POA: Diagnosis not present

## 2020-09-02 DIAGNOSIS — K219 Gastro-esophageal reflux disease without esophagitis: Secondary | ICD-10-CM | POA: Diagnosis not present

## 2020-09-02 DIAGNOSIS — M6281 Muscle weakness (generalized): Secondary | ICD-10-CM | POA: Diagnosis not present

## 2020-09-02 DIAGNOSIS — J449 Chronic obstructive pulmonary disease, unspecified: Secondary | ICD-10-CM | POA: Diagnosis not present

## 2020-09-02 DIAGNOSIS — E039 Hypothyroidism, unspecified: Secondary | ICD-10-CM | POA: Diagnosis not present

## 2020-09-03 DIAGNOSIS — F4322 Adjustment disorder with anxiety: Secondary | ICD-10-CM | POA: Diagnosis not present

## 2020-09-10 DIAGNOSIS — F4322 Adjustment disorder with anxiety: Secondary | ICD-10-CM | POA: Diagnosis not present

## 2020-09-15 DIAGNOSIS — E46 Unspecified protein-calorie malnutrition: Secondary | ICD-10-CM | POA: Diagnosis not present

## 2020-09-15 DIAGNOSIS — E039 Hypothyroidism, unspecified: Secondary | ICD-10-CM | POA: Diagnosis not present

## 2020-09-15 DIAGNOSIS — K219 Gastro-esophageal reflux disease without esophagitis: Secondary | ICD-10-CM | POA: Diagnosis not present

## 2020-09-27 DIAGNOSIS — F419 Anxiety disorder, unspecified: Secondary | ICD-10-CM | POA: Diagnosis not present

## 2020-09-27 DIAGNOSIS — E039 Hypothyroidism, unspecified: Secondary | ICD-10-CM | POA: Diagnosis not present

## 2020-09-27 DIAGNOSIS — R21 Rash and other nonspecific skin eruption: Secondary | ICD-10-CM | POA: Diagnosis not present

## 2020-09-27 DIAGNOSIS — E119 Type 2 diabetes mellitus without complications: Secondary | ICD-10-CM | POA: Diagnosis not present

## 2020-09-28 ENCOUNTER — Telehealth: Payer: Self-pay | Admitting: Family Medicine

## 2020-09-28 NOTE — Telephone Encounter (Signed)
Spoke to pt she is in rehab and will call back to  schedule Medicare Annual Wellness Visit (AWV) either virtually or in office.   Last AWV ; please schedule at anytime with [facility] Nurse Health Advisor 1 or 2   This should be a 45 minute visit.

## 2020-09-29 ENCOUNTER — Other Ambulatory Visit: Payer: Self-pay | Admitting: Family Medicine

## 2020-09-30 DIAGNOSIS — R21 Rash and other nonspecific skin eruption: Secondary | ICD-10-CM | POA: Diagnosis not present

## 2020-09-30 DIAGNOSIS — E559 Vitamin D deficiency, unspecified: Secondary | ICD-10-CM | POA: Diagnosis not present

## 2020-09-30 DIAGNOSIS — J449 Chronic obstructive pulmonary disease, unspecified: Secondary | ICD-10-CM | POA: Diagnosis not present

## 2020-09-30 DIAGNOSIS — M6281 Muscle weakness (generalized): Secondary | ICD-10-CM | POA: Diagnosis not present

## 2020-10-06 DIAGNOSIS — M6281 Muscle weakness (generalized): Secondary | ICD-10-CM | POA: Diagnosis not present

## 2020-10-06 DIAGNOSIS — E118 Type 2 diabetes mellitus with unspecified complications: Secondary | ICD-10-CM | POA: Diagnosis not present

## 2020-10-06 DIAGNOSIS — F32A Depression, unspecified: Secondary | ICD-10-CM | POA: Diagnosis not present

## 2020-10-09 DIAGNOSIS — J449 Chronic obstructive pulmonary disease, unspecified: Secondary | ICD-10-CM | POA: Diagnosis not present

## 2020-10-09 DIAGNOSIS — E559 Vitamin D deficiency, unspecified: Secondary | ICD-10-CM | POA: Diagnosis not present

## 2020-10-09 DIAGNOSIS — E119 Type 2 diabetes mellitus without complications: Secondary | ICD-10-CM | POA: Diagnosis not present

## 2020-10-09 DIAGNOSIS — E039 Hypothyroidism, unspecified: Secondary | ICD-10-CM | POA: Diagnosis not present

## 2020-10-23 DIAGNOSIS — J9601 Acute respiratory failure with hypoxia: Secondary | ICD-10-CM | POA: Diagnosis present

## 2020-10-23 DIAGNOSIS — R2689 Other abnormalities of gait and mobility: Secondary | ICD-10-CM | POA: Diagnosis present

## 2020-10-23 DIAGNOSIS — U071 COVID-19: Secondary | ICD-10-CM | POA: Diagnosis present

## 2020-10-23 DIAGNOSIS — M25561 Pain in right knee: Secondary | ICD-10-CM | POA: Diagnosis present

## 2020-10-23 DIAGNOSIS — M25562 Pain in left knee: Secondary | ICD-10-CM | POA: Diagnosis present

## 2020-10-23 DIAGNOSIS — E118 Type 2 diabetes mellitus with unspecified complications: Secondary | ICD-10-CM | POA: Diagnosis present

## 2020-10-23 DIAGNOSIS — J449 Chronic obstructive pulmonary disease, unspecified: Secondary | ICD-10-CM | POA: Diagnosis present

## 2020-11-02 DIAGNOSIS — E118 Type 2 diabetes mellitus with unspecified complications: Secondary | ICD-10-CM | POA: Diagnosis not present

## 2020-11-02 DIAGNOSIS — M6281 Muscle weakness (generalized): Secondary | ICD-10-CM | POA: Diagnosis not present

## 2020-11-02 DIAGNOSIS — E559 Vitamin D deficiency, unspecified: Secondary | ICD-10-CM | POA: Diagnosis not present

## 2020-11-14 DIAGNOSIS — D509 Iron deficiency anemia, unspecified: Secondary | ICD-10-CM | POA: Diagnosis not present

## 2020-11-14 DIAGNOSIS — J449 Chronic obstructive pulmonary disease, unspecified: Secondary | ICD-10-CM | POA: Diagnosis not present

## 2020-11-14 DIAGNOSIS — I2699 Other pulmonary embolism without acute cor pulmonale: Secondary | ICD-10-CM | POA: Diagnosis not present

## 2020-11-14 DIAGNOSIS — E039 Hypothyroidism, unspecified: Secondary | ICD-10-CM | POA: Diagnosis not present

## 2020-11-27 DIAGNOSIS — E119 Type 2 diabetes mellitus without complications: Secondary | ICD-10-CM | POA: Diagnosis not present

## 2020-11-27 DIAGNOSIS — D72829 Elevated white blood cell count, unspecified: Secondary | ICD-10-CM | POA: Diagnosis not present

## 2020-11-27 DIAGNOSIS — J449 Chronic obstructive pulmonary disease, unspecified: Secondary | ICD-10-CM | POA: Diagnosis not present

## 2020-12-04 DIAGNOSIS — J189 Pneumonia, unspecified organism: Secondary | ICD-10-CM | POA: Diagnosis not present

## 2020-12-04 DIAGNOSIS — D509 Iron deficiency anemia, unspecified: Secondary | ICD-10-CM | POA: Diagnosis not present

## 2020-12-14 DIAGNOSIS — Z23 Encounter for immunization: Secondary | ICD-10-CM | POA: Diagnosis not present

## 2020-12-16 ENCOUNTER — Telehealth: Payer: Self-pay | Admitting: Family Medicine

## 2020-12-16 NOTE — Telephone Encounter (Signed)
Pt is calling in wanting to get a hospital bed, commode and railing for her bathroom and she is needing this by Monday 12/20/2020 b/c she is moving back to [facility] from [facility].  Pt would like to have a call back she is aware that the provider is not in the office due to it being his 1/2 day and will receive it on 12/17/2020 and can not guarantee that she will have the equipment by Monday.

## 2020-12-17 NOTE — Telephone Encounter (Signed)
The patient contacted the office to see if Dr. Sarajane Jews received her message yesterday requesting medical supplies for her home.  Dr. Sarajane Jews is requesting a face to face video visit. The patient is currently at [facility].   The patient stated that they won't let her go home until this medical equipment is at her home. She is in [facility] and she lives in [place] is why they are not helping her with getting the equipment delivered to her home.  I asked the patient to have the [facility] to send [place] a fax requesting this medical supplies.  I was going to schedule the patient for a video visit but she said that she will have to wait to do a video when she gets home but they won't let her go home without the hospital bed there first.  I was disconnected from the patient while she was looking for the facility number.  Please advise

## 2020-12-17 NOTE — Telephone Encounter (Signed)
Set up an OV for Jaime Kelley to discuss this. There is no way possible to get this done by Monday

## 2020-12-17 NOTE — Telephone Encounter (Signed)
Called pt to schedule appointment, unable to leave voicemail box was full.

## 2020-12-18 DIAGNOSIS — D509 Iron deficiency anemia, unspecified: Secondary | ICD-10-CM | POA: Diagnosis not present

## 2020-12-18 DIAGNOSIS — J449 Chronic obstructive pulmonary disease, unspecified: Secondary | ICD-10-CM | POA: Diagnosis not present

## 2020-12-18 DIAGNOSIS — E039 Hypothyroidism, unspecified: Secondary | ICD-10-CM | POA: Diagnosis not present

## 2020-12-18 DIAGNOSIS — E119 Type 2 diabetes mellitus without complications: Secondary | ICD-10-CM | POA: Diagnosis not present

## 2020-12-20 NOTE — Telephone Encounter (Signed)
Unfortunately Medicare will NOT pay for any medical equipment we order unless we have had a face to face visit with the patient within the last 90 days. I have not seen her since July 2021. There is no way I can help her otherwise. The staff at the facility where she is staying can certainly provide her a way to have a virtual visit with me

## 2020-12-20 NOTE — Telephone Encounter (Signed)
Called pt unable to leave voicemail, box was full

## 2020-12-21 NOTE — Telephone Encounter (Signed)
Attempted to cal pt son regarding this message no option of leaving a message on  voicemail

## 2020-12-21 NOTE — Telephone Encounter (Signed)
Spoke with pt and the daughter yesterday, pt daughter state that she did not think pt is able to take care of herself at home without help. Pt daughter state that she [place] and is not able to follow up with a virtual visit needed for pt approval for the medical equipments requested, Pt daughter advised to contact pt son(Brother) and make arrangements for pt appointment.

## 2020-12-25 DIAGNOSIS — E119 Type 2 diabetes mellitus without complications: Secondary | ICD-10-CM | POA: Diagnosis not present

## 2020-12-25 DIAGNOSIS — Z7901 Long term (current) use of anticoagulants: Secondary | ICD-10-CM | POA: Diagnosis not present

## 2020-12-25 DIAGNOSIS — D509 Iron deficiency anemia, unspecified: Secondary | ICD-10-CM | POA: Diagnosis not present

## 2020-12-27 DIAGNOSIS — J449 Chronic obstructive pulmonary disease, unspecified: Secondary | ICD-10-CM | POA: Diagnosis not present

## 2020-12-27 DIAGNOSIS — I10 Essential (primary) hypertension: Secondary | ICD-10-CM | POA: Diagnosis not present

## 2020-12-27 DIAGNOSIS — R0602 Shortness of breath: Secondary | ICD-10-CM | POA: Diagnosis not present

## 2020-12-28 DIAGNOSIS — Z5320 Procedure and treatment not carried out because of patient's decision for unspecified reasons: Secondary | ICD-10-CM | POA: Diagnosis not present

## 2020-12-28 DIAGNOSIS — I10 Essential (primary) hypertension: Secondary | ICD-10-CM | POA: Diagnosis not present

## 2020-12-28 DIAGNOSIS — J9601 Acute respiratory failure with hypoxia: Secondary | ICD-10-CM | POA: Diagnosis not present

## 2020-12-28 DIAGNOSIS — E559 Vitamin D deficiency, unspecified: Secondary | ICD-10-CM | POA: Diagnosis not present

## 2020-12-28 DIAGNOSIS — E118 Type 2 diabetes mellitus with unspecified complications: Secondary | ICD-10-CM | POA: Diagnosis not present

## 2020-12-28 DIAGNOSIS — D519 Vitamin B12 deficiency anemia, unspecified: Secondary | ICD-10-CM | POA: Diagnosis not present

## 2020-12-28 DIAGNOSIS — J449 Chronic obstructive pulmonary disease, unspecified: Secondary | ICD-10-CM | POA: Diagnosis not present

## 2020-12-30 ENCOUNTER — Telehealth: Payer: Self-pay

## 2020-12-30 NOTE — Telephone Encounter (Signed)
Spoke with pt daughter Bevelyn Ngo who is listed on pt DPR aware that I spoke with a nurse at the facility where her mother is currently residing, updated pt daughter regarding pt care at the facility per the Rebeca Alert LPN at the facility, verbalized understanding, state that she will be coming to [facility] on Monday and will call the office to schedule pt a virtual appointment with Dr Sarajane Jews.

## 2020-12-30 NOTE — Telephone Encounter (Signed)
Spoke with the Rebeca Alert) LPN taking care of pt at the facility home where pt is residing, [facility] that pt has been refusing to take some of her medications including checking her  and also blood sugar checks from the nurses. She explained that pt is total care and the nurses  help with all her ADLS.Left a detailed message on pt daughters phone advised to return my call in the office.

## 2021-01-06 ENCOUNTER — Encounter: Payer: Self-pay | Admitting: Family Medicine

## 2021-01-07 NOTE — Telephone Encounter (Signed)
Spoke with pt daughter advised for pt to have a telephone visit on the Saturday clinic for pt RA flare up since Dr Sarajane Jews has left the office for the weekend. Pt is scheduled for telephone visit on 01/08/2021 at 10 am

## 2021-01-08 ENCOUNTER — Encounter: Payer: Self-pay | Admitting: Internal Medicine

## 2021-01-08 ENCOUNTER — Telehealth (INDEPENDENT_AMBULATORY_CARE_PROVIDER_SITE_OTHER): Payer: Medicare Other | Admitting: Internal Medicine

## 2021-01-08 DIAGNOSIS — Z593 Problems related to living in residential institution: Secondary | ICD-10-CM

## 2021-01-08 DIAGNOSIS — J449 Chronic obstructive pulmonary disease, unspecified: Secondary | ICD-10-CM

## 2021-01-08 DIAGNOSIS — M0579 Rheumatoid arthritis with rheumatoid factor of multiple sites without organ or systems involvement: Secondary | ICD-10-CM

## 2021-01-08 DIAGNOSIS — Z79899 Other long term (current) drug therapy: Secondary | ICD-10-CM

## 2021-01-08 DIAGNOSIS — Z789 Other specified health status: Secondary | ICD-10-CM

## 2021-01-08 NOTE — Progress Notes (Signed)
  Virtual Visit via Telephone Note  I connected with@ on 01/08/21 at 10:00 AM EDT by telephone and verified that I am speaking with the correct person using two identifiers.   I discussed the limitations, risks, security and privacy concerns of performing an evaluation and management service by telephone and the limited availability of in person appointments. tThere may be a patient responsible charge related to this service. The patient expressed understanding and agreed to proceed.  Location patient: home Location provider: work or home office Participants present for the call: patient, provider Patient did not have a visit in the prior 7 days to address this/these issue(s).   History of Present Illness: DANN VENTRESS presents to Saturday clinic on a phone call because of her concern and flareup for 3 to 4 days of her arthritis RA.  Her PCP was not available when the message came in. On interview patient is in a skilled nursing facility in [place] ever since serious hospitalization in the fall 2022 which included a diagnosis of SARS-CoV-2.  She states she has a history of rheumatoid arthritis and osteoarthritis.  She had seen Dr. Dossie Der in the past and Dr. Sarajane Jews was helping.  She was previously on methotrexate and Humira but "her roommate could not give her injections anymore". She has not had a flare in a while but was on prednisone very early in her diagnosis. She is somewhat at her wits end she has asked the nursing staff to talk with the physicians to get help on her arthritis pain.  She has not heard back yet that was 3 days ago. She has a daughter in [place] and a son who works full-time.  Asked to have some prednisone called in.  No fever no change in shortness of breath she is frustrated and wants to "get out of this place". Her blood sugars been okay her Metformin has been decreased to half a pill.   Observations/Objective: Patient sounds alert oriented unhappy on the phone I do  not appreciate any SOB.  But occasional bronchial type cough Speech and thought processing are grossly intact. Patient reported vitals:   Assessment and Plan:  Discussed with her unfortunately I cannot order prednisone or other medication in this situation and she is under care of the facility management and also although prednisone is great for inflammation can aggravate other diseases or infection.    Advised I would be happy to speak with the nursing staff or the attending physician about trying to get her some help.  In regard to her" arthritis flare "".  And I will speak with Dr. Sarajane Jews first thing after the weekend about any other possibilities or help he can offer. Follow Up Instructions:     [id number] 5-10 [id number] 11-20 [id number] 21-30 I did not refer this patient for an OV in the next 24 hours for this/these issue(s).  I discussed the assessment and treatment plan with the patient. The patient was provided an opportunity to ask questions and answered. The patient agreed with the plan and demonstrated an understanding of the instructions.   The patient was advised to call back or seek an in-person evaluation if the symptoms worsen or if the condition fails to improve as anticipated.  I provided 21 minutes of non-face-to-face time during this encounter. Return for see text.  Shanon Ace, MD

## 2021-01-11 ENCOUNTER — Telehealth: Payer: Self-pay | Admitting: Family Medicine

## 2021-01-11 NOTE — Telephone Encounter (Signed)
Please advise. Pt was asked to have a telephone visit on 3/19 due to Dr. Sarajane Jews being out of the office. Pt had a telephone visit with Dr. Regis Bill on 3/19 but I do not see any medications sent in.

## 2021-01-11 NOTE — Telephone Encounter (Signed)
Pts daughter [name]) is call in to see if she can get a call back to have a steroid called in to the pts living facility [facility] in [facility] for her RA flair up.  Daughter would like to have a call back to let her know if it can be done.

## 2021-01-12 ENCOUNTER — Encounter: Payer: Self-pay | Admitting: Family Medicine

## 2021-01-12 MED ORDER — METHYLPREDNISOLONE 4 MG PO TBPK: ORAL_TABLET | ORAL | 0 refills | Status: DC

## 2021-01-12 NOTE — Telephone Encounter (Signed)
Patient responded through [employer]. Nothing further needed.

## 2021-01-12 NOTE — Addendum Note (Signed)
Addended by: Rebecca Eaton on: 01/12/2021 09:55 AM   Modules accepted: Orders

## 2021-01-12 NOTE — Telephone Encounter (Signed)
Call in a 4 mg Medrol dose pack (21 tablets) to take as directed

## 2021-01-12 NOTE — Telephone Encounter (Signed)
Rx sent in. ATC to call the patient, unable to leave a voice mail.

## 2021-02-08 DIAGNOSIS — E119 Type 2 diabetes mellitus without complications: Secondary | ICD-10-CM | POA: Diagnosis not present

## 2021-02-08 DIAGNOSIS — E039 Hypothyroidism, unspecified: Secondary | ICD-10-CM | POA: Diagnosis not present

## 2021-02-08 DIAGNOSIS — R059 Cough, unspecified: Secondary | ICD-10-CM | POA: Diagnosis not present

## 2021-02-08 DIAGNOSIS — J449 Chronic obstructive pulmonary disease, unspecified: Secondary | ICD-10-CM | POA: Diagnosis not present

## 2021-02-08 DIAGNOSIS — I10 Essential (primary) hypertension: Secondary | ICD-10-CM | POA: Diagnosis not present

## 2021-02-09 DIAGNOSIS — I10 Essential (primary) hypertension: Secondary | ICD-10-CM | POA: Diagnosis not present

## 2021-02-09 DIAGNOSIS — J449 Chronic obstructive pulmonary disease, unspecified: Secondary | ICD-10-CM | POA: Diagnosis not present

## 2021-02-09 DIAGNOSIS — R051 Acute cough: Secondary | ICD-10-CM | POA: Diagnosis not present

## 2021-02-10 DIAGNOSIS — I2782 Chronic pulmonary embolism: Secondary | ICD-10-CM | POA: Diagnosis not present

## 2021-02-10 DIAGNOSIS — J449 Chronic obstructive pulmonary disease, unspecified: Secondary | ICD-10-CM | POA: Diagnosis not present

## 2021-02-10 DIAGNOSIS — D649 Anemia, unspecified: Secondary | ICD-10-CM | POA: Diagnosis not present

## 2021-02-15 DIAGNOSIS — E039 Hypothyroidism, unspecified: Secondary | ICD-10-CM | POA: Diagnosis not present

## 2021-02-23 DIAGNOSIS — I2699 Other pulmonary embolism without acute cor pulmonale: Secondary | ICD-10-CM | POA: Diagnosis not present

## 2021-02-23 DIAGNOSIS — E119 Type 2 diabetes mellitus without complications: Secondary | ICD-10-CM | POA: Diagnosis not present

## 2021-02-23 DIAGNOSIS — E039 Hypothyroidism, unspecified: Secondary | ICD-10-CM | POA: Diagnosis not present

## 2021-02-23 DIAGNOSIS — D509 Iron deficiency anemia, unspecified: Secondary | ICD-10-CM | POA: Diagnosis not present

## 2021-03-08 ENCOUNTER — Telehealth: Payer: Self-pay | Admitting: Family Medicine

## 2021-03-08 NOTE — Telephone Encounter (Signed)
Left message for patient to call back and schedule Medicare Annual Wellness Visit (AWV) either virtually or in office.    please schedule at anytime with [facility] Nurse Health Advisor 1 or 2   This should be a 45 minute visit.

## 2021-03-16 DIAGNOSIS — M79642 Pain in left hand: Secondary | ICD-10-CM | POA: Diagnosis not present

## 2021-03-16 DIAGNOSIS — E119 Type 2 diabetes mellitus without complications: Secondary | ICD-10-CM | POA: Diagnosis not present

## 2021-03-17 DIAGNOSIS — M19042 Primary osteoarthritis, left hand: Secondary | ICD-10-CM | POA: Diagnosis not present

## 2021-03-17 DIAGNOSIS — D509 Iron deficiency anemia, unspecified: Secondary | ICD-10-CM | POA: Diagnosis not present

## 2021-03-18 DIAGNOSIS — Y999 Unspecified external cause status: Secondary | ICD-10-CM | POA: Diagnosis not present

## 2021-03-18 DIAGNOSIS — I10 Essential (primary) hypertension: Secondary | ICD-10-CM | POA: Diagnosis not present

## 2021-03-18 DIAGNOSIS — J9610 Chronic respiratory failure, unspecified whether with hypoxia or hypercapnia: Secondary | ICD-10-CM | POA: Diagnosis not present

## 2021-03-18 DIAGNOSIS — D649 Anemia, unspecified: Secondary | ICD-10-CM | POA: Diagnosis not present

## 2021-03-18 DIAGNOSIS — R634 Abnormal weight loss: Secondary | ICD-10-CM | POA: Diagnosis not present

## 2021-03-18 DIAGNOSIS — M79642 Pain in left hand: Secondary | ICD-10-CM | POA: Diagnosis not present

## 2021-03-18 DIAGNOSIS — M79643 Pain in unspecified hand: Secondary | ICD-10-CM | POA: Diagnosis not present

## 2021-03-18 DIAGNOSIS — M65342 Trigger finger, left ring finger: Secondary | ICD-10-CM | POA: Diagnosis not present

## 2021-03-18 DIAGNOSIS — W230XXA Caught, crushed, jammed, or pinched between moving objects, initial encounter: Secondary | ICD-10-CM | POA: Diagnosis not present

## 2021-03-18 DIAGNOSIS — M7989 Other specified soft tissue disorders: Secondary | ICD-10-CM | POA: Diagnosis not present

## 2021-03-18 DIAGNOSIS — M65332 Trigger finger, left middle finger: Secondary | ICD-10-CM | POA: Diagnosis not present

## 2021-03-19 DIAGNOSIS — Z049 Encounter for examination and observation for unspecified reason: Secondary | ICD-10-CM | POA: Diagnosis not present

## 2021-03-19 DIAGNOSIS — M19042 Primary osteoarthritis, left hand: Secondary | ICD-10-CM | POA: Diagnosis not present

## 2021-03-22 DIAGNOSIS — D509 Iron deficiency anemia, unspecified: Secondary | ICD-10-CM | POA: Diagnosis not present

## 2021-03-22 DIAGNOSIS — I10 Essential (primary) hypertension: Secondary | ICD-10-CM | POA: Diagnosis not present

## 2021-03-22 DIAGNOSIS — M20029 Boutonniere deformity of unspecified finger(s): Secondary | ICD-10-CM | POA: Diagnosis not present

## 2021-03-22 DIAGNOSIS — R278 Other lack of coordination: Secondary | ICD-10-CM | POA: Diagnosis not present

## 2021-03-22 DIAGNOSIS — E118 Type 2 diabetes mellitus with unspecified complications: Secondary | ICD-10-CM | POA: Diagnosis not present

## 2021-03-22 DIAGNOSIS — D649 Anemia, unspecified: Secondary | ICD-10-CM | POA: Diagnosis not present

## 2021-06-22 ENCOUNTER — Telehealth: Payer: Self-pay | Admitting: Family Medicine

## 2021-06-22 NOTE — Telephone Encounter (Signed)
Tried calling patient to schedule Medicare Annual Wellness Visit (AWV) either virtually or in office.   No answer  awvi 07/23/14 per [facility]   please schedule at anytime with [facility] Nurse Health Advisor 1 or 2   This should be a 45 minute visit.

## 2021-10-25 ENCOUNTER — Ambulatory Visit: Payer: Medicare Other

## 2021-10-25 ENCOUNTER — Telehealth: Payer: Self-pay

## 2021-10-25 NOTE — Telephone Encounter (Signed)
Unsuccessful attempt to reach patient for scheduled AWV on preferred number listed in notes. Left message on voicemail okay to reschedule

## 2021-11-08 ENCOUNTER — Telehealth (INDEPENDENT_AMBULATORY_CARE_PROVIDER_SITE_OTHER): Payer: Medicare (Managed Care) | Admitting: Family Medicine

## 2021-11-08 ENCOUNTER — Encounter: Payer: Self-pay | Admitting: Family Medicine

## 2021-11-08 DIAGNOSIS — E039 Hypothyroidism, unspecified: Secondary | ICD-10-CM

## 2021-11-08 DIAGNOSIS — M329 Systemic lupus erythematosus, unspecified: Secondary | ICD-10-CM | POA: Diagnosis not present

## 2021-11-08 DIAGNOSIS — M0579 Rheumatoid arthritis with rheumatoid factor of multiple sites without organ or systems involvement: Secondary | ICD-10-CM | POA: Diagnosis not present

## 2021-11-08 DIAGNOSIS — J439 Emphysema, unspecified: Secondary | ICD-10-CM

## 2021-11-08 DIAGNOSIS — F411 Generalized anxiety disorder: Secondary | ICD-10-CM | POA: Diagnosis not present

## 2021-11-08 NOTE — Progress Notes (Signed)
° °  Subjective:    Patient ID: Jaime Kelley, female    DOB: 03-Sep-1951, 71 y.o.   MRN: [id number]  HPI Virtual Visit via Telephone Note  I connected with the patient on 11/08/21 at  3:15 PM EST by telephone and verified that I am speaking with the correct person using two identifiers.   I discussed the limitations, risks, security and privacy concerns of performing an evaluation and management service by telephone and the availability of in person appointments. I also discussed with the patient that there may be a patient responsible charge related to this service. The patient expressed understanding and agreed to proceed.  Location patient: home Location provider: work or home office Participants present for the call: patient, provider Patient did not have a visit in the prior 7 days to address this/these issue(s).   History of Present Illness: Jaime Kelley wanted to speak to me today to discuss her current living situation. She had been living alone in her house until October 2021 when she was admitted to [facility]. Apparently she was confused and delusional at that time, so the Psychiatrist seeing her deemed her to be not competent to make medical decisions for herself. After her DC she was sent to the [facility] in [place] (at [phone]). She has been living there for a little over a year. Jaime Kelley spoke to me today on her cell phone to voice her displeasure over the center and her care. She says she often is not given any of her medication (like thyroid and RA medication) and this causes her to have a lot of joint pains and her "hair falls out". She wants to go back home to live on her own again. She says she has spoken about this to a Education officer, museum, but they have not been helpful. She says they only come around once a month or so. She is not allowed to have a computer.    Observations/Objective: Patient sounds cheerful and well on the phone. I do  not appreciate any SOB. Speech and thought processing are grossly intact. Patient reported vitals:  Assessment and Plan: I told her that there is little I can do about the situation since I am not directly involved in her care any more. I did tell her that I will try to contact an area agency that could talk to her about her options.  [name], MD   Follow Up Instructions:     [phone] 5-10 [phone] 11-20 [id number] 21-30 I did not refer this patient for an OV in the next 24 hours for this/these issue(s).  I discussed the assessment and treatment plan with the patient. The patient was provided an opportunity to ask questions and all were answered. The patient agreed with the plan and demonstrated an understanding of the instructions.   The patient was advised to call back or seek an in-person evaluation if the symptoms worsen or if the condition fails to improve as anticipated.  I provided 24 minutes of non-face-to-face time during this encounter.   [name], MD     Review of Systems     Objective:   Physical Exam        Assessment & Plan:

## 2023-12-22 DEATH — deceased
